# Patient Record
Sex: Male | Born: 1940 | Race: White | Hispanic: No | Marital: Married | Smoking: Never smoker
Health system: Southern US, Community
[De-identification: ages and names within clinical notes are randomized; demographics above are authoritative.]

## PROBLEM LIST (undated history)

## (undated) DIAGNOSIS — K219 Gastro-esophageal reflux disease without esophagitis: Secondary | ICD-10-CM

## (undated) DIAGNOSIS — Z8711 Personal history of peptic ulcer disease: Secondary | ICD-10-CM

## (undated) DIAGNOSIS — H269 Unspecified cataract: Secondary | ICD-10-CM

## (undated) DIAGNOSIS — M1990 Unspecified osteoarthritis, unspecified site: Secondary | ICD-10-CM

## (undated) DIAGNOSIS — Z87442 Personal history of urinary calculi: Secondary | ICD-10-CM

## (undated) DIAGNOSIS — T8859XA Other complications of anesthesia, initial encounter: Secondary | ICD-10-CM

## (undated) DIAGNOSIS — K469 Unspecified abdominal hernia without obstruction or gangrene: Secondary | ICD-10-CM

## (undated) DIAGNOSIS — Z9889 Other specified postprocedural states: Secondary | ICD-10-CM

## (undated) DIAGNOSIS — R112 Nausea with vomiting, unspecified: Secondary | ICD-10-CM

## (undated) DIAGNOSIS — H409 Unspecified glaucoma: Secondary | ICD-10-CM

## (undated) DIAGNOSIS — Z8719 Personal history of other diseases of the digestive system: Secondary | ICD-10-CM

## (undated) HISTORY — DX: Personal history of peptic ulcer disease: Z87.11

## (undated) HISTORY — DX: Gastro-esophageal reflux disease without esophagitis: K21.9

## (undated) HISTORY — DX: Unspecified abdominal hernia without obstruction or gangrene: K46.9

## (undated) HISTORY — DX: Unspecified cataract: H26.9

## (undated) HISTORY — PX: COLONOSCOPY: SHX174

## (undated) HISTORY — PX: UPPER GASTROINTESTINAL ENDOSCOPY: SHX188

## (undated) HISTORY — DX: Personal history of other diseases of the digestive system: Z87.19

## (undated) HISTORY — PX: MOLE REMOVAL: SHX2046

## (undated) HISTORY — DX: Unspecified glaucoma: H40.9

## (undated) HISTORY — PX: HERNIA REPAIR: SHX51

---

## 2003-03-18 ENCOUNTER — Ambulatory Visit (HOSPITAL_COMMUNITY): Admission: RE | Admit: 2003-03-18 | Discharge: 2003-03-18 | Payer: Self-pay | Admitting: Internal Medicine

## 2004-03-20 ENCOUNTER — Ambulatory Visit: Payer: Self-pay | Admitting: Internal Medicine

## 2005-03-22 ENCOUNTER — Ambulatory Visit: Payer: Self-pay | Admitting: Internal Medicine

## 2006-06-20 ENCOUNTER — Ambulatory Visit: Payer: Self-pay | Admitting: Internal Medicine

## 2010-05-30 NOTE — Assessment & Plan Note (Signed)
NAME:  NECHEMIA, CHIAPPETTA                CHART#:  [id number]   DATE:  06/20/2006                       DOB:  16-Sep-1940   CHIEF COMPLAINT:  Followup UC.   SUBJECTIVE:  Mr. 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  His last colonoscopy was in 2005,  where he had multiple biopsies, which were negative for dysplasia.  He  has been maintained on Asacol 1.2 g b.i.d.  He is doing quite well.  He  denies abdominal pain, rectal bleeding, melena, mucus in his stool.  He  denies any diarrhea or constipation.  He occasionally has gas or  bloating.  He had previously been on Imuran and tells me he has been  able to maintain remission on just mesalamine as he was having problems  with frequent sinus infections.   CURRENT MEDICATIONS:  See list from 06/20/06.   ALLERGIES:  No known drug allergies.   OBJECTIVE:  VITAL SIGNS:  Weight 180 lb, height 67 inches, temperature  97.6, blood pressure 120/82, pulse 72.  GENERAL:  Mr. Tashiro is a well-developed, well-nourished Caucasian male  in no acute distress.  HEENT:  Sclerae clear.  Conjunctivae pink.  Oropharynx pink and moist  without any lesions.  CHEST:  Heart regular rate and rhythm.  Normal S1, S2.  ABDOMEN:  Positive bowel sounds x4.  No bruits auscultated.  Soft,  nontender, nondistended without palpable mass, hepatosplenomegaly,  rigidity or guarding.  EXTREMITIES:  Without clubbing or edema bilaterally.  SKIN:  Pink, warm and dry without any rash or jaundice.   ASSESSMENT:  Mr. Brimage is a 70 year old Caucasian male with over 40  year duration of ulcerative colitis.  He is due for surveillance  colonoscopy with random biopsies.  He is wanting to wait until Dr.  Laural Golden begins practicing in [place] and have his procedure done there.   PLAN:  1. Check CBC and LFTs and met-7.  2. Continue Asacol 400 mg 3 p.o. b.i.d.  3. He is to schedule colonoscopy with Dr. Olevia Perches office as soon as  possible.       Vickey Huger, N.P.  Electronically Signed     Hildred Laser, M.D.  Electronically Signed    KJ/MEDQ  D:  06/21/2006  T:  06/21/2006  Job:  [id number]   cc:   Nadara Mustard, M.D.

## 2010-06-02 NOTE — Op Note (Signed)
NAME:  David Mooney, David Mooney                         ACCOUNT NO.:  [id number]   MEDICAL RECORD NO.:  [id number]                   PATIENT TYPE:  AMB   LOCATION:  DAY                                  FACILITY:  [facility]   PHYSICIAN:  Hildred Laser, M.D.                 DATE OF BIRTH:  1940/05/15   DATE OF PROCEDURE:  03/18/2003  DATE OF DISCHARGE:                                 OPERATIVE REPORT   PROCEDURE:  Total colonoscopy.   ENDOSCOPIST:  Hildred Laser, M.D.   INDICATIONS:  [name] is a 70 year old Caucasian male who has had ulcerative  colitis for over 40 years. He is undergoing surveillance colonoscopy.  Other  than some diarrhea recently he has done well. He is on oral mesalamine and  Imuran.  He is getting his periodic CBCs.  The procedure and risks were  reviewed with the patient and informed consent was obtained. He is  undergoing surveillance exam.   PREOPERATIVE MEDICATIONS:  Demerol 25 mg IV and Versed 4 mg IV in divided  dose.   FINDINGS:  Procedure performed in endoscopy suite.  The patient's vital  signs and O2 saturation were monitored during the procedure and remained  stable.  The patient was placed in the left lateral recumbent position and  rectal examination was performed.  No abnormality noted on external or  digital exam.   Olympus videoscope was placed in the rectum and advanced under vision into  the sigmoid colon and beyond.  Preparation was satisfactory.  He had focal  erythema at sigmoid colon with some scarring.  Focal scarring was also seen  involving the rest of his colon.  The scope was passed to the cecum which  was identified by ileocecal valve.  The blind end of the cecum was well  seen.  He had mucosal erythema with granularity.  He had focal colitis of  the cecum.  Pictures were taken for the record.  As the scope was withdrawn  the colonic mucosa was, once again, carefully examined.  Random biopsies  were taken from the ascending colon, not cecum  since it was inflamed, along  with biopsies from the transverse, descending and sigmoid colon and finally  from the rectum.  These biopsies were looking for dysplasia.  There were two  small polyps.  One was a the distal transverse colon which was ablated by a  cold biopsy.  The other was at sigmoid colon which was also ablated by a  cold biopsy.  Anorectal junction was well seen by retroflexing the scope and  were normal.   The endoscope was straightened and withdrawn.  The patient tolerated the  procedure well.   FINAL DIAGNOSES:  1. Mild active colitis involving the cecum, otherwise no endoscopic evidence     of a mass.  2. Two small polyps ablated by a cold biopsy; one from transverse colon and     another  one from sigmoid colon.  3. Focal colitis at cecum.  4. Random biopsies taken from different areas looking for dysplasia.   RECOMMENDATIONS:  He will continue his usual medications.  I will be  contacting the patient with biopsy results and further recommendations.      ___________________________________________                                            Hildred Laser, M.D.   NR/MEDQ  D:  03/18/2003  T:  03/18/2003  Job:  [id number]   cc:   Dr. Charm Rings  Naomi

## 2010-06-02 NOTE — Consult Note (Signed)
NAME:  David Mooney, David Mooney NO.:  [id number]   MEDICAL RECORD NO.:  [id number]                   PATIENT TYPE:   LOCATION:                                       FACILITY:   PHYSICIAN:  Hildred Laser, M.D.                 DATE OF BIRTH:  1940/01/27   DATE OF CONSULTATION:  02/25/2003  DATE OF DISCHARGE:                                   CONSULTATION   CHIEF COMPLAINT:  Ulcerative colitis.   HISTORY OF PRESENT ILLNESS:  David Mooney is a 70 year old Caucasian male who  was initially diagnosed at age 70 while he was at the [place]  with ulcerative colitis.  Last colonoscopy he believes was in [place]  approximately 4 years ago.  Last colonoscopy by David Mooney was in April 1997  which showed mild disease of the hepatic flexure, distal sigmoid, and  rectum.  He was begun on Imuran in the early part of 2003.  Symptomatically  he has been doing quite well for the last year or so until approximately 3  weeks ago he noted some abdominal distention, increasing flatulence, and  loose watery stools approximately 4-5 times a day for about 3 weeks.  However, the last 2 weeks he has noted solid bowel movements b.i.d.  He  denies any recent antibiotics, new pets, or foreign travel.  He is taking  Metamucil t.i.d. as well.  He also has history of heartburn and indigestion  and has been taking Prilosec over-the-counter for the last 4 days which he  reports good relief.  He denies any nausea or vomiting.  He denies any fever  or chills.  Weight is steady, appetite is good.  Maintenance medications  include Imuran 50 mg b.i.d., as well as Asacol 1600 mg b.i.d.   CURRENT MEDICATIONS:  1. Imuran 50 mg b.i.d.  2. Asacol 1600 mg b.i.d.  3. Multivitamin daily.  4. Naprosyn p.r.n.  5. Metamucil t.i.d.  6. Gaviscon p.r.n.  7. Vitamin E daily.  8. Prilosec 20 mg daily.   ALLERGIES:  No known drug allergies.   PAST MEDICAL HISTORY:  1. Ulcerative colitis  diagnosed at age 36.  Last colonoscopy - [place] 3-4     years ago.  Do not have this report.  2. Glaucoma.  3. Peptic ulcer disease as well as H. pylori positive status post triple-     drug treatment in March 1995.  4. Mumps as a child.   PAST SURGICAL HISTORY:  Left inguinal hernia repair.   FAMILY HISTORY:  No known family history of colorectal carcinoma, liver, or  chronic GI problems.  Mother (89) has history of dementia and father (76)  has hypertension, renal failure, and AAA.  He has two sisters in good  health.   SOCIAL HISTORY:  David Mooney has been married for 4 years.  He has two grown  children.  He is  employed full-time as an Chief Financial Officer.  He lives in [place] but he plans on moving back within the next 2 years or so.  Denies any tobacco, alcohol, or drug use.   REVIEW OF SYSTEMS:  CONSTITUTIONAL:  Weight is steady, appetite is okay.  Denies any fatigue.  MUSCULOSKELETAL:  Is complaining of occasional right  hip pain.  CARDIOVASCULAR:  Denies any chest pain or palpitations.  PULMONARY:  Denies any shortness of breath, dyspnea, or hemoptysis.  GI:  See HPI.  Denies any dysphagia or odynophagia.   PHYSICAL EXAMINATION:  VITAL SIGNS:  Weight 177 pounds, height 66.5 inches.  Temperature 96.8, blood pressure 130/90, pulse 72.  GENERAL:  David Mooney is a 70 year old Caucasian male who is alert,  oriented, pleasant, and cooperative and in no acute distress.  HEENT:  Sclerae clear, nonicteric.  Conjunctivae pink.  Oropharynx pink and  moist without any lesions.  NECK:  Supple without any mass or thyromegaly.  CHEST:  Heart regular rate and rhythm without murmurs, clicks, rubs, or  gallops.  Lungs clear to auscultation bilaterally.  ABDOMEN:  Soft with positive bowel sounds x4.  Soft, nontender,  nondistended, with no palpable mass or hepatosplenomegaly.  EXTREMITIES:  Show 2+ pedal pulses bilaterally, no edema.   LABORATORY DATA:  Last CBC from September 19, 2002:  WBC 5.2, hemoglobin  13.9, hematocrit 41.1, platelets 199.  David Mooney states he has had CBC  within the last few weeks at his primary physician.  Will request these  records.  If we do not receive them by procedure date will perform CBC at  date of procedure.   ASSESSMENT:  David Mooney is a 70 year old Caucasian male with pancolonic  ulcerative colitis.  He has given history of frequent exacerbations and  remissions.  Apparently about 6 weeks ago he may have had another  exacerbation; however, he appears to be doing better over the last 2 weeks  or so.  He has been on Imuran for a significant amount of time.  Would  recommend repeat surveillance colonoscopy at this time.   RECOMMENDATIONS:  1. Colonoscopy to be performed at [facility] by David Mooney.  I have     discussed this procedure including risks and benefits which include but     are not limited to bleeding, perforation, infection, and drug reaction     with David Mooney.  He agrees with this plan.  2. Refills are given for Asacol 400 mg four p.o. b.i.d. #240 with two     refills, Imuran 50 mg one p.o. b.i.d. #60 with two refills.  3. Further recommendations pending colonoscopy.     Les Pou, N.P.                 Hildred Laser, M.D.    KC/MEDQ  D:  02/26/2003  T:  02/26/2003  Job:  [id number]

## 2010-06-19 ENCOUNTER — Ambulatory Visit (INDEPENDENT_AMBULATORY_CARE_PROVIDER_SITE_OTHER): Payer: Medicare Other | Admitting: Internal Medicine

## 2010-06-19 DIAGNOSIS — K5289 Other specified noninfective gastroenteritis and colitis: Secondary | ICD-10-CM

## 2011-06-19 ENCOUNTER — Other Ambulatory Visit (INDEPENDENT_AMBULATORY_CARE_PROVIDER_SITE_OTHER): Payer: Self-pay | Admitting: Internal Medicine

## 2011-07-24 ENCOUNTER — Ambulatory Visit (INDEPENDENT_AMBULATORY_CARE_PROVIDER_SITE_OTHER): Payer: Medicare Other | Admitting: Internal Medicine

## 2011-07-24 ENCOUNTER — Encounter (INDEPENDENT_AMBULATORY_CARE_PROVIDER_SITE_OTHER): Payer: Self-pay | Admitting: Internal Medicine

## 2011-07-24 VITALS — BP 120/70 | HR 78 | Temp 97.7°F | Resp 20 | Ht 66.0 in | Wt 189.3 lb

## 2011-07-24 DIAGNOSIS — R143 Flatulence: Secondary | ICD-10-CM

## 2011-07-24 DIAGNOSIS — H409 Unspecified glaucoma: Secondary | ICD-10-CM | POA: Insufficient documentation

## 2011-07-24 DIAGNOSIS — R141 Gas pain: Secondary | ICD-10-CM

## 2011-07-24 DIAGNOSIS — K5190 Ulcerative colitis, unspecified, without complications: Secondary | ICD-10-CM | POA: Insufficient documentation

## 2011-07-24 MED ORDER — ALIGN PO CAPS: 1.0000 | ORAL_CAPSULE | Freq: Every day | ORAL | Status: AC

## 2011-07-24 MED ORDER — MESALAMINE 1.2 G PO TBEC: 2400.0000 mg | DELAYED_RELEASE_TABLET | Freq: Two times a day (BID) | ORAL | Status: DC

## 2011-07-24 NOTE — Progress Notes (Signed)
Presenting complaint;  Bloating and diarrhea since had a virus. History of ulcerative colitis. Subjective:  Patient is a 71 year old Caucasian male who was at her scheduled visit. He was last seen over a year ago. He has over a 50 year history of ulcerative colitis. He stated he had stomach virus back in January 2013. He recalls he had single episode of emesis and diarrhea. His bowels have not returned to normal until recently and he's also been experiencing flatulence. He does not report rectal bleeding. He did increase Asacol dose to 8 tablets per day. He denies abdominal pain fever, chills, anorexia or weight loss. Patient's last surveillance colonoscopy was in August 2009 and biopsies were negative for dysplasia.  Current Medications: Current Outpatient Prescriptions  Medication Sig Dispense Refill  . ASACOL 400 MG EC tablet TAKE 3 TABLETS BY MOUTH TWICE DAILY  180 tablet  11  . bimatoprost (LUMIGAN) 0.01 % SOLN 1 drop at bedtime. 1 drop in both eyes at bedtime      . Cholecalciferol (VITAMIN D) 2000 UNITS tablet Take 2,000 Units by mouth daily.      Marland Kitchen esomeprazole (NEXIUM) 40 MG capsule Take 40 mg by mouth daily before breakfast.      . loratadine-pseudoephedrine (CLARITIN-D 24-HOUR) 10-240 MG per 24 hr tablet Take 1 tablet by mouth as needed.      . MULTIPLE VITAMINS PO Take by mouth daily.      Marland Kitchen SALINE NASAL SPRAY NA Place into the nose as needed.      . Tamsulosin HCl (FLOMAX) 0.4 MG CAPS Take 0.4 mg by mouth daily.         Objective: Blood pressure 120/70, pulse 78, temperature 97.7 F (36.5 C), temperature source Oral, resp. rate 20, height 5' 6"  (1.676 m), weight 189 lb 4.8 oz (85.866 kg). Patient is alert and in no acute distress. Conjunctiva is pink. Sclera is nonicteric Oropharyngeal mucosa is normal. No neck masses or thyromegaly noted. Cardiac exam with regular rhythm normal S1 and S2. No murmur or gallop noted. Lungs are clear to auscultation. Abdomen is soft and  nontender without organomegaly or masses. Rectal examination reveals soft brown guaiac-negative stool.  No LE edema or clubbing noted.   Assessment:  #1. 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 His symptoms are not suggestive of relapse of ulcerative colitis. Reassuring to note that his stool is guaiac negative. His last colonoscopy was in August 2009 and was unremarkable. #2. Chronic GERD. Symptoms well-controlled with therapy.   Plan:  Start Lialda 2.4 g by mouth twice a day line one capsule by mouth daily when Asacol prescription runs out. Align one capsule by mouth daily. Office visit in one year unless symptoms persist or progress.

## 2011-07-24 NOTE — Patient Instructions (Addendum)
Discontinue Asacol when prescription runs out. New medication is he Lialda 2.4 g by mouth twice daily. Align 1 capsule by mouth daily

## 2012-07-08 ENCOUNTER — Encounter (INDEPENDENT_AMBULATORY_CARE_PROVIDER_SITE_OTHER): Payer: Self-pay | Admitting: *Deleted

## 2012-07-29 ENCOUNTER — Encounter (INDEPENDENT_AMBULATORY_CARE_PROVIDER_SITE_OTHER): Payer: Self-pay | Admitting: Internal Medicine

## 2012-07-29 ENCOUNTER — Ambulatory Visit (INDEPENDENT_AMBULATORY_CARE_PROVIDER_SITE_OTHER): Payer: Medicare Other | Admitting: Internal Medicine

## 2012-07-29 ENCOUNTER — Other Ambulatory Visit (INDEPENDENT_AMBULATORY_CARE_PROVIDER_SITE_OTHER): Payer: Self-pay | Admitting: *Deleted

## 2012-07-29 ENCOUNTER — Telehealth (INDEPENDENT_AMBULATORY_CARE_PROVIDER_SITE_OTHER): Payer: Self-pay | Admitting: *Deleted

## 2012-07-29 ENCOUNTER — Encounter (INDEPENDENT_AMBULATORY_CARE_PROVIDER_SITE_OTHER): Payer: Self-pay | Admitting: *Deleted

## 2012-07-29 VITALS — BP 158/62 | HR 76 | Temp 97.9°F | Ht 66.5 in | Wt 198.4 lb

## 2012-07-29 DIAGNOSIS — K5190 Ulcerative colitis, unspecified, without complications: Secondary | ICD-10-CM

## 2012-07-29 DIAGNOSIS — Z1211 Encounter for screening for malignant neoplasm of colon: Secondary | ICD-10-CM

## 2012-07-29 LAB — CBC WITH DIFFERENTIAL/PLATELET
Basophils Relative: 1 % (ref 0–1)
Eosinophils Absolute: 0.1 10*3/uL (ref 0.0–0.7)
Eosinophils Relative: 2 % (ref 0–5)
Hemoglobin: 12.5 g/dL — ABNORMAL LOW (ref 13.0–17.0)
Lymphs Abs: 1.6 10*3/uL (ref 0.7–4.0)
MCH: 28.4 pg (ref 26.0–34.0)
MCHC: 33.8 g/dL (ref 30.0–36.0)
MCV: 84.1 fL (ref 78.0–100.0)
Monocytes Relative: 10 % (ref 3–12)
Neutrophils Relative %: 58 % (ref 43–77)
RBC: 4.4 MIL/uL (ref 4.22–5.81)

## 2012-07-29 MED ORDER — PEG-KCL-NACL-NASULF-NA ASC-C 100 G PO SOLR: 1.0000 | Freq: Once | ORAL | Status: DC

## 2012-07-29 NOTE — Telephone Encounter (Signed)
Patient needs movi prep 

## 2012-07-29 NOTE — Progress Notes (Signed)
Subjective:     Patient ID: David Mooney, male   DOB: 1940/11/19, 72 y.o.   MRN: [id number]  HPI Here for f/u of his Ulcerative colitis.  He was last seen one year ago.  He has had UC over 50 yrs.  Maitained on Lialda 1.2gm two BID. He tells me he is doing very well.  He is having a BM 1-2 a day. Stools are normal size. No melena, mucous or blood.  Appetite is good. No weight loss.  His last colonosocpy was in 2009, and revealed focal mild colitis at cecum. Biopsy showed active chronic colitis.  Biopsies from various other segments showed mild colitis but no evidence of dysplasia.     Review of Systems   Current Outpatient Prescriptions  Medication Sig Dispense Refill  . azelastine (OPTIVAR) 0.05 % ophthalmic solution 1 drop 2 (two) times daily.      . bimatoprost (LUMIGAN) 0.01 % SOLN 1 drop at bedtime. 1 drop in both eyes at bedtime      . Cholecalciferol (VITAMIN D) 2000 UNITS tablet Take 2,000 Units by mouth daily.      Marland Kitchen esomeprazole (NEXIUM) 40 MG capsule Take 40 mg by mouth daily before breakfast.      . loratadine-pseudoephedrine (CLARITIN-D 24-HOUR) 10-240 MG per 24 hr tablet Take 1 tablet by mouth as needed.      . mesalamine (LIALDA) 1.2 G EC tablet Take 1,200 mg by mouth daily with breakfast. 2 tabs BID daily      . MULTIPLE VITAMINS PO Take by mouth daily.      . Probiotic Product (ALIGN PO) Take by mouth.      Marland Kitchen SALINE NASAL SPRAY NA Place into the nose as needed.      . Tamsulosin HCl (FLOMAX) 0.4 MG CAPS Take 0.4 mg by mouth daily.       No current facility-administered medications for this visit.   Past Medical History  Diagnosis Date  . Ulcerative colitis   . GERD (gastroesophageal reflux disease)   . Hernia of unspecified site of abdominal cavity without mention of obstruction or gangrene   . Glaucoma   . Cataract   . History of gastric ulcer    Past Surgical History  Procedure Laterality Date  . Hernia repair    . Colonoscopy    . Upper gastrointestinal  endoscopy    . Mole removal      From head , chest   No Known Allergies      Objective:   Physical Exam  Filed Vitals:   07/29/12 1136  BP: 158/62  Pulse: 76  Temp: 97.9 F (36.6 C)  Height: 5' 6.5" (1.689 m)  Weight: 198 lb 6.4 oz (89.994 kg)   Alert and oriented. Skin warm and dry. Oral mucosa is moist.   . Sclera anicteric, conjunctivae is pink. Thyroid not enlarged. No cervical lymphadenopathy. Lungs clear. Heart regular rate and rhythm.  Abdomen is soft. Bowel sounds are positive. No hepatomegaly. No abdominal masses felt. No tenderness.  No edema to lower extremities.       Assessment:   UC which appears to be in remission. He is doing well.  In need of surveillance colonoscopy    Plan:     Surveillance colonoscopy. CBC, CRP

## 2012-07-29 NOTE — Patient Instructions (Addendum)
Colonoscopy.  The risks and benefits such as perforation, bleeding, and infection were reviewed with the patient and is agreeable. 

## 2012-08-04 ENCOUNTER — Other Ambulatory Visit (INDEPENDENT_AMBULATORY_CARE_PROVIDER_SITE_OTHER): Payer: Self-pay | Admitting: Internal Medicine

## 2012-08-18 ENCOUNTER — Encounter (HOSPITAL_COMMUNITY): Payer: Self-pay | Admitting: Pharmacy Technician

## 2012-08-27 ENCOUNTER — Encounter (HOSPITAL_COMMUNITY)
Admission: RE | Disposition: A | Discharge status: Home / Self Care | Payer: Self-pay | Source: Ambulatory Visit | Attending: Internal Medicine

## 2012-08-27 ENCOUNTER — Ambulatory Visit (HOSPITAL_COMMUNITY)
Admission: RE | Admit: 2012-08-27 | Discharge: 2012-08-27 | Disposition: A | Discharge status: Home / Self Care | Payer: Medicare Other | Source: Ambulatory Visit | Attending: Internal Medicine | Admitting: Internal Medicine

## 2012-08-27 ENCOUNTER — Encounter (HOSPITAL_COMMUNITY): Payer: Self-pay | Admitting: *Deleted

## 2012-08-27 DIAGNOSIS — Z8711 Personal history of peptic ulcer disease: Secondary | ICD-10-CM | POA: Insufficient documentation

## 2012-08-27 DIAGNOSIS — K5190 Ulcerative colitis, unspecified, without complications: Secondary | ICD-10-CM

## 2012-08-27 DIAGNOSIS — Z1211 Encounter for screening for malignant neoplasm of colon: Secondary | ICD-10-CM | POA: Insufficient documentation

## 2012-08-27 DIAGNOSIS — K219 Gastro-esophageal reflux disease without esophagitis: Secondary | ICD-10-CM | POA: Insufficient documentation

## 2012-08-27 DIAGNOSIS — H269 Unspecified cataract: Secondary | ICD-10-CM | POA: Insufficient documentation

## 2012-08-27 DIAGNOSIS — K644 Residual hemorrhoidal skin tags: Secondary | ICD-10-CM

## 2012-08-27 DIAGNOSIS — H409 Unspecified glaucoma: Secondary | ICD-10-CM | POA: Insufficient documentation

## 2012-08-27 DIAGNOSIS — K6389 Other specified diseases of intestine: Secondary | ICD-10-CM

## 2012-08-27 DIAGNOSIS — Z79899 Other long term (current) drug therapy: Secondary | ICD-10-CM | POA: Insufficient documentation

## 2012-08-27 HISTORY — PX: COLONOSCOPY: SHX5424

## 2012-08-27 SURGERY — COLONOSCOPY
Anesthesia: Moderate Sedation

## 2012-08-27 MED ORDER — SODIUM CHLORIDE 0.9 % IV SOLN
INTRAVENOUS | Status: DC
  Administered 2012-08-27: 10:00:00 via INTRAVENOUS

## 2012-08-27 MED ORDER — MEPERIDINE HCL 50 MG/ML IJ SOLN
INTRAMUSCULAR | Status: AC
  Filled 2012-08-27: qty 1

## 2012-08-27 MED ORDER — MIDAZOLAM HCL 5 MG/5ML IJ SOLN
INTRAMUSCULAR | Status: DC | PRN
  Administered 2012-08-27 (×3): 2 mg via INTRAVENOUS

## 2012-08-27 MED ORDER — MIDAZOLAM HCL 5 MG/5ML IJ SOLN
INTRAMUSCULAR | Status: AC
  Filled 2012-08-27: qty 10

## 2012-08-27 MED ORDER — STERILE WATER FOR IRRIGATION IR SOLN
Status: DC | PRN
  Administered 2012-08-27: 11:00:00

## 2012-08-27 MED ORDER — MEPERIDINE HCL 50 MG/ML IJ SOLN
INTRAMUSCULAR | Status: DC | PRN
  Administered 2012-08-27 (×2): 25 mg via INTRAVENOUS

## 2012-08-27 NOTE — Op Note (Signed)
COLONOSCOPY PROCEDURE REPORT  PATIENT:  David Mooney  MR#:  [id number] Birthdate:  1940-09-29, 72 y.o., male Endoscopist:  Dr. Rogene Houston, MD Referred By:  Dr. Karren Burly, MD  Procedure Date: 08/27/2012  Procedure:   Colonoscopy  Indications:  Patient is a 72 year old Caucasian male who is undergoing surveillance colonoscopy. He has here history of ulcerative colitis and presently appears to be in remission. His last colonoscopy was in August 2009.  Informed Consent:  The procedure and risks were reviewed with the patient and informed consent was obtained.  Medications:  Demerol 50 mg IV Versed 6 mg IV  Description of procedure:  After a digital rectal exam was performed, that colonoscope was advanced from the anus through the rectum and colon to the area of the cecum, ileocecal valve and appendiceal orifice. The cecum was deeply intubated. These structures were well-seen and photographed for the record. From the level of the cecum and ileocecal valve, the scope was slowly and cautiously withdrawn. The mucosal surfaces were carefully surveyed utilizing scope tip to flexion to facilitate fold flattening as needed. The scope was pulled down into the rectum where a thorough exam including retroflexion was performed. Terminal ileum was also examined.  Findings:   Prep satisfactory. Normal mucosa of terminal ileum. Mucosa involving blunt and the cecum reveals erythema edema friability and erosions. Biopsy taken for routine histology. Mucosa of the rest of the colon was normal. Prominent scarring noted in rectosigmoid junction. Normal mucosa of rectum. Small hemorrhoids below the dentate line.   Therapeutic/Diagnostic Maneuvers Performed:  See above  Complications:  None  Cecal Withdrawal Time:  12 minutes  Impression:  Normal mucosa of terminal ileum. Focal colitis at cecum. Biopsy taken. Mucosa rest of the colon unremarkable except for scarring. Small external  hemorrhoids.  Recommendations:  Standard instructions given. I will contact patient with biopsy results and further recommendations.  Christyl Osentoski U  08/27/2012 11:19 AM  CC: Dr. Curlene Labrum, MD & Dr. Rayne Du ref. provider found

## 2012-08-27 NOTE — H&P (Signed)
David Mooney is an 72 y.o. male.   Chief Complaint: Patient is here for colonoscopy. HPI: Patient is 72 year old Caucasian male with a history of ulcerative colitis who is here for surveillance colonoscopy. His last exam was in August 2000 and. He denies rectal bleeding diarrhea or change in his bowel habits. Family history is negative for CRC.  Past Medical History  Diagnosis Date  . Ulcerative colitis   . GERD (gastroesophageal reflux disease)   . Hernia of unspecified site of abdominal cavity without mention of obstruction or gangrene   . Glaucoma   . Cataract   . History of gastric ulcer     Past Surgical History  Procedure Laterality Date  . Hernia repair    . Colonoscopy    . Upper gastrointestinal endoscopy    . Mole removal      From head , chest    Family History  Problem Relation Age of Onset  . Arrhythmia Mother   . Anuerysm Father   . Hypertension Father   . Diabetes Father   . Healthy Sister   . Healthy Sister   . Irritable bowel syndrome Daughter   . Thyroid disease Daughter    Social History:  reports that he has never smoked. He has never used smokeless tobacco. He reports that he does not drink alcohol or use illicit drugs.  Allergies: No Known Allergies  Medications Prior to Admission  Medication Sig Dispense Refill  . acetaminophen (TYLENOL) 500 MG tablet Take 1,000 mg by mouth every 6 (six) hours as needed for pain.      . bimatoprost (LUMIGAN) 0.01 % SOLN 1 drop at bedtime. 1 drop in both eyes at bedtime      . Cholecalciferol (VITAMIN D) 2000 UNITS tablet Take 2,000 Units by mouth daily.      Marland Kitchen esomeprazole (NEXIUM) 40 MG capsule Take 40 mg by mouth daily before breakfast.      . LIALDA 1.2 G EC tablet TAKE 2 TABLETS BY MOUTH TWICE DAILY  120 tablet  11  . MULTIPLE VITAMINS PO Take 1 tablet by mouth daily.       . Probiotic Product (ALIGN PO) Take 1 tablet by mouth daily.       . Tamsulosin HCl (FLOMAX) 0.4 MG CAPS Take 0.4 mg by mouth daily.       Marland Kitchen azelastine (OPTIVAR) 0.05 % ophthalmic solution Place 1 drop into both eyes daily.       Marland Kitchen loratadine-pseudoephedrine (CLARITIN-D 24-HOUR) 10-240 MG per 24 hr tablet Take 1 tablet by mouth daily as needed for allergies.         No results found for this or any previous visit (from the past 48 hour(s)). No results found.  ROS  Blood pressure 138/74, pulse 89, temperature 98.2 F (36.8 C), temperature source Oral, resp. rate 20, height 5' 6.5" (1.689 m), weight 180 lb (81.647 kg), SpO2 92.00%. Physical Exam  Constitutional: He appears well-developed and well-nourished.  HENT:  Mouth/Throat: Oropharynx is clear and moist.  Eyes: Conjunctivae are normal. No scleral icterus.  Neck: No thyromegaly present.  Cardiovascular: Normal rate, regular rhythm and normal heart sounds.   Respiratory: Effort normal and breath sounds normal.  GI: Soft. He exhibits no distension and no mass. There is no tenderness.  Musculoskeletal: He exhibits no edema.  Lymphadenopathy:    He has no cervical adenopathy.  Neurological: He is alert.  Skin: Skin is warm and dry.     Assessment/Plan Chronic ulcerative colitis.  Surveillance colonoscopy.  David Mooney 08/27/2012, 10:48 AM

## 2012-08-29 ENCOUNTER — Other Ambulatory Visit (INDEPENDENT_AMBULATORY_CARE_PROVIDER_SITE_OTHER): Payer: Self-pay | Admitting: Internal Medicine

## 2012-08-29 ENCOUNTER — Encounter (HOSPITAL_COMMUNITY): Payer: Self-pay | Admitting: Internal Medicine

## 2012-08-29 MED ORDER — MESALAMINE 400 MG PO CPDR: 1600.0000 mg | DELAYED_RELEASE_CAPSULE | Freq: Two times a day (BID) | ORAL | Status: DC

## 2012-09-01 ENCOUNTER — Encounter (INDEPENDENT_AMBULATORY_CARE_PROVIDER_SITE_OTHER): Payer: Self-pay | Admitting: *Deleted

## 2013-02-17 ENCOUNTER — Ambulatory Visit (INDEPENDENT_AMBULATORY_CARE_PROVIDER_SITE_OTHER): Payer: Medicare Other | Admitting: Urology

## 2013-02-17 DIAGNOSIS — N318 Other neuromuscular dysfunction of bladder: Secondary | ICD-10-CM

## 2013-02-17 DIAGNOSIS — R351 Nocturia: Secondary | ICD-10-CM

## 2013-02-17 DIAGNOSIS — N320 Bladder-neck obstruction: Secondary | ICD-10-CM

## 2013-03-31 ENCOUNTER — Ambulatory Visit (INDEPENDENT_AMBULATORY_CARE_PROVIDER_SITE_OTHER): Payer: Medicare Other | Admitting: Urology

## 2013-03-31 DIAGNOSIS — N318 Other neuromuscular dysfunction of bladder: Secondary | ICD-10-CM

## 2013-04-08 ENCOUNTER — Encounter (INDEPENDENT_AMBULATORY_CARE_PROVIDER_SITE_OTHER): Payer: Self-pay | Admitting: *Deleted

## 2013-08-31 ENCOUNTER — Ambulatory Visit (INDEPENDENT_AMBULATORY_CARE_PROVIDER_SITE_OTHER): Payer: Medicare Other | Admitting: Internal Medicine

## 2013-09-14 ENCOUNTER — Ambulatory Visit (INDEPENDENT_AMBULATORY_CARE_PROVIDER_SITE_OTHER): Payer: Medicare Other | Admitting: Internal Medicine

## 2013-09-14 ENCOUNTER — Encounter (INDEPENDENT_AMBULATORY_CARE_PROVIDER_SITE_OTHER): Payer: Self-pay | Admitting: Internal Medicine

## 2013-09-14 VITALS — BP 130/80 | HR 76 | Temp 97.3°F | Resp 18 | Ht 66.5 in | Wt 190.2 lb

## 2013-09-14 DIAGNOSIS — K5190 Ulcerative colitis, unspecified, without complications: Secondary | ICD-10-CM

## 2013-09-14 DIAGNOSIS — R141 Gas pain: Secondary | ICD-10-CM

## 2013-09-14 DIAGNOSIS — R143 Flatulence: Secondary | ICD-10-CM

## 2013-09-14 DIAGNOSIS — R142 Eructation: Secondary | ICD-10-CM

## 2013-09-14 DIAGNOSIS — K219 Gastro-esophageal reflux disease without esophagitis: Secondary | ICD-10-CM

## 2013-09-14 MED ORDER — MESALAMINE 1.2 G PO TBEC: 2.4000 g | DELAYED_RELEASE_TABLET | Freq: Two times a day (BID) | ORAL | Status: DC

## 2013-09-14 NOTE — Patient Instructions (Signed)
Keep symptom diary for 2 month as frequency or flatulence to determine which foods are causing this problem. Call if flatulence becomes intractable. Hemoccult x 1 when stool dark

## 2013-09-14 NOTE — Progress Notes (Signed)
Presenting complaint;  Followup for ulcerative colitis.  Subjective:  Patient is 73 year old Caucasian male who presents for scheduled visit. He was last seen July 2014. He feels well other than he has intermittent flatulence and right lower quadrant abdominal pain which is relieved when passing flatus. When this occurs if stool becomes somewhat dark but not black. He usually has one formed stool daily and occasionally 2. He denies melena rectal bleeding or discharge. He had blood work by Dr. Pleas Koch 2 months ago and was begun on statin for hyperlipidemia. He states Nexium is working. He denies heartburn dysphagia sore throat hoarseness or chronic cough. He has very good appetite but he is trying to lose weight. He has lost 8 pounds since his last visit.   Current Medications: Outpatient Encounter Prescriptions as of 09/14/2013  Medication Sig  . acetaminophen (TYLENOL) 500 MG tablet Take 325 mg by mouth 2 (two) times daily as needed.   Marland Kitchen atorvastatin (LIPITOR) 20 MG tablet Take 20 mg by mouth daily.  . bimatoprost (LUMIGAN) 0.01 % SOLN 1 drop at bedtime. 1 drop in both eyes at bedtime  . Cholecalciferol (VITAMIN D) 2000 UNITS tablet Take 2,000 Units by mouth daily.  Marland Kitchen esomeprazole (NEXIUM) 40 MG capsule Take 40 mg by mouth daily before breakfast.  . fluticasone (FLONASE) 50 MCG/ACT nasal spray Place 2 sprays into both nostrils as needed for allergies or rhinitis.  Marland Kitchen mesalamine (LIALDA) 1.2 G EC tablet Take 1.2 g by mouth. Patient takes 2 by mouth twice daily  . Phenyleph-CPM-DM-APAP (TYLENOL COLD MULTI-SYMPTOM PO) Take by mouth. Patient uses on as needed basis , when it is used it is twice a day.  . Probiotic Product (ALIGN PO) Take 1 tablet by mouth daily.   . solifenacin (VESICARE) 5 MG tablet Take 5 mg by mouth daily.  . Tamsulosin HCl (FLOMAX) 0.4 MG CAPS Take 0.4 mg by mouth daily.  . MULTIPLE VITAMINS PO Take 1 tablet by mouth daily.   . [DISCONTINUED] azelastine (OPTIVAR) 0.05 %  ophthalmic solution Place 1 drop into both eyes daily.   . [DISCONTINUED] loratadine-pseudoephedrine (CLARITIN-D 24-HOUR) 10-240 MG per 24 hr tablet Take 1 tablet by mouth daily as needed for allergies.   . [DISCONTINUED] Mesalamine (ASACOL) 400 MG CPDR DR capsule Take 4 capsules (1,600 mg total) by mouth 2 (two) times daily.     Objective: Blood pressure 130/80, pulse 76, temperature 97.3 F (36.3 C), temperature source Oral, resp. rate 18, height 5' 6.5" (1.689 m), weight 190 lb 3.2 oz (86.274 kg). Patient is alert and in no acute distress. Conjunctiva is pink. Sclera is nonicteric Oropharyngeal mucosa is normal. No neck masses or thyromegaly noted. Cardiac exam with regular rhythm normal S1 and S2. No murmur or gallop noted. Lungs are clear to auscultation. Abdomen is full. Small umbilicus hernia noted which is reducible. Abdomen is soft and nontender without organomegaly or masses.  No LE edema or clubbing noted.  Labs/studies Results: Lab data from 07/14/2013 WBC 6.2, H&H 13.3 and 39.6 and platelet count is 168K. BUN 17 and creatinine 1.36. Bilirubin 0.2, AP 61, AST 16, ALT 15, total protein 6.4 and albumin 4.1.  Assessment:  #1. 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 Patient remains in remission. Last colonoscopy was in August 2014 revealing mild focal cecal inflammation. #2. Excessive flatulence. Patient does not have a lot of symptoms. If dietary measures and symptomatic therapy does not help will consider further evaluation. #3. GERD. Symptoms well controlled with therapy.  Plan:  Hemoccult x 1 when stool is dark. Written instructions provided to patient regarding flatulogenic foods that he should avoid or eat less of. New prescription given for Lialda along with samples. Progress report in 2 months. Office visit in one year.

## 2013-09-15 DIAGNOSIS — R143 Flatulence: Secondary | ICD-10-CM | POA: Insufficient documentation

## 2013-09-15 DIAGNOSIS — K219 Gastro-esophageal reflux disease without esophagitis: Secondary | ICD-10-CM | POA: Insufficient documentation

## 2014-01-01 ENCOUNTER — Encounter (INDEPENDENT_AMBULATORY_CARE_PROVIDER_SITE_OTHER): Payer: Self-pay

## 2014-01-20 DIAGNOSIS — R7301 Impaired fasting glucose: Secondary | ICD-10-CM | POA: Diagnosis not present

## 2014-01-20 DIAGNOSIS — K219 Gastro-esophageal reflux disease without esophagitis: Secondary | ICD-10-CM | POA: Diagnosis not present

## 2014-01-20 DIAGNOSIS — K5180 Other ulcerative colitis without complications: Secondary | ICD-10-CM | POA: Diagnosis not present

## 2014-01-20 DIAGNOSIS — N183 Chronic kidney disease, stage 3 (moderate): Secondary | ICD-10-CM | POA: Diagnosis not present

## 2014-01-20 DIAGNOSIS — E782 Mixed hyperlipidemia: Secondary | ICD-10-CM | POA: Diagnosis not present

## 2014-01-26 DIAGNOSIS — N400 Enlarged prostate without lower urinary tract symptoms: Secondary | ICD-10-CM | POA: Diagnosis not present

## 2014-01-26 DIAGNOSIS — Z23 Encounter for immunization: Secondary | ICD-10-CM | POA: Diagnosis not present

## 2014-01-26 DIAGNOSIS — E782 Mixed hyperlipidemia: Secondary | ICD-10-CM | POA: Diagnosis not present

## 2014-01-26 DIAGNOSIS — N182 Chronic kidney disease, stage 2 (mild): Secondary | ICD-10-CM | POA: Diagnosis not present

## 2014-01-26 DIAGNOSIS — K219 Gastro-esophageal reflux disease without esophagitis: Secondary | ICD-10-CM | POA: Diagnosis not present

## 2014-01-26 DIAGNOSIS — K5180 Other ulcerative colitis without complications: Secondary | ICD-10-CM | POA: Diagnosis not present

## 2014-06-01 ENCOUNTER — Encounter (INDEPENDENT_AMBULATORY_CARE_PROVIDER_SITE_OTHER): Payer: Self-pay | Admitting: *Deleted

## 2014-07-30 DIAGNOSIS — L247 Irritant contact dermatitis due to plants, except food: Secondary | ICD-10-CM | POA: Diagnosis not present

## 2014-08-30 DIAGNOSIS — E782 Mixed hyperlipidemia: Secondary | ICD-10-CM | POA: Diagnosis not present

## 2014-08-30 DIAGNOSIS — R7301 Impaired fasting glucose: Secondary | ICD-10-CM | POA: Diagnosis not present

## 2014-08-30 DIAGNOSIS — E559 Vitamin D deficiency, unspecified: Secondary | ICD-10-CM | POA: Diagnosis not present

## 2014-08-30 DIAGNOSIS — K219 Gastro-esophageal reflux disease without esophagitis: Secondary | ICD-10-CM | POA: Diagnosis not present

## 2014-08-30 DIAGNOSIS — N182 Chronic kidney disease, stage 2 (mild): Secondary | ICD-10-CM | POA: Diagnosis not present

## 2014-09-06 DIAGNOSIS — K5180 Other ulcerative colitis without complications: Secondary | ICD-10-CM | POA: Diagnosis not present

## 2014-09-06 DIAGNOSIS — Z0001 Encounter for general adult medical examination with abnormal findings: Secondary | ICD-10-CM | POA: Diagnosis not present

## 2014-09-06 DIAGNOSIS — Z1389 Encounter for screening for other disorder: Secondary | ICD-10-CM | POA: Diagnosis not present

## 2014-09-16 ENCOUNTER — Ambulatory Visit (INDEPENDENT_AMBULATORY_CARE_PROVIDER_SITE_OTHER): Payer: Medicare Other | Admitting: Internal Medicine

## 2014-09-16 ENCOUNTER — Encounter (INDEPENDENT_AMBULATORY_CARE_PROVIDER_SITE_OTHER): Payer: Self-pay | Admitting: Internal Medicine

## 2014-09-16 VITALS — BP 112/65 | HR 60 | Temp 98.3°F | Ht 66.5 in | Wt 186.8 lb

## 2014-09-16 DIAGNOSIS — K219 Gastro-esophageal reflux disease without esophagitis: Secondary | ICD-10-CM | POA: Diagnosis not present

## 2014-09-16 DIAGNOSIS — K5120 Ulcerative (chronic) proctitis without complications: Secondary | ICD-10-CM

## 2014-09-16 NOTE — Patient Instructions (Signed)
OV in 1 year.  

## 2014-09-16 NOTE — Progress Notes (Signed)
Subjective:    Patient ID: David Mooney, male    DOB: 03/18/1940, 74 y.o.   MRN: [id number]  HPI Here today for f/u of his UC. Disease duration of 49 yrs. Last colonoscopy was in August of 2014 revealing mild focal cecal inflammation. He was last seen by Dr Laural Golden in August of 2015 Maintained Lialda 2 tabs twice a day. He tells me he is doing good. No abdominal pain. He does have flatus at times. GERD controlled with Nexium. He has not had any rectal bleeding or abdominal pain. His appetite is good. He has lost 3 pounds since his last visit in August of 2015.   He exercises by working in yard and pulling weeds. He does do some walking.     Review of Systems Past Medical History  Diagnosis Date  . Ulcerative colitis   . GERD (gastroesophageal reflux disease)   . Hernia of unspecified site of abdominal cavity without mention of obstruction or gangrene   . Glaucoma   . Cataract   . History of gastric ulcer     Past Surgical History  Procedure Laterality Date  . Hernia repair    . Colonoscopy    . Upper gastrointestinal endoscopy    . Mole removal      From head , chest  . Colonoscopy N/A 08/27/2012    Procedure: COLONOSCOPY;  Surgeon: Rogene Houston, MD;  Location: [facility];  Service: Endoscopy;  Laterality: N/A;  1200-moved to 13:15 Ann to notify pt    No Known Allergies  Current Outpatient Prescriptions on File Prior to Visit  Medication Sig Dispense Refill  . acetaminophen (TYLENOL) 500 MG tablet Take 325 mg by mouth 2 (two) times daily as needed.     Marland Kitchen atorvastatin (LIPITOR) 20 MG tablet Take 20 mg by mouth daily.    . bimatoprost (LUMIGAN) 0.01 % SOLN 1 drop at bedtime. 1 drop in both eyes at bedtime    . Cholecalciferol (VITAMIN D) 2000 UNITS tablet Take 2,000 Units by mouth daily.    Marland Kitchen esomeprazole (NEXIUM) 40 MG capsule Take 40 mg by mouth daily before breakfast.    . fluticasone (FLONASE) 50 MCG/ACT nasal spray Place 2 sprays into both nostrils as needed  for allergies or rhinitis.    Marland Kitchen mesalamine (LIALDA) 1.2 G EC tablet Take 2 tablets (2.4 g total) by mouth 2 (two) times daily. Patient takes 2 by mouth twice daily 120 tablet 11  . MULTIPLE VITAMINS PO Take 1 tablet by mouth daily.     Marland Kitchen Phenyleph-CPM-DM-APAP (TYLENOL COLD MULTI-SYMPTOM PO) Take by mouth. Patient uses on as needed basis , when it is used it is twice a day.    . Probiotic Product (ALIGN PO) Take 1 tablet by mouth daily.     . solifenacin (VESICARE) 5 MG tablet Take 5 mg by mouth daily.    . Tamsulosin HCl (FLOMAX) 0.4 MG CAPS Take 0.4 mg by mouth daily.     No current facility-administered medications on file prior to visit.        Objective:   Physical Exam Blood pressure 112/65, pulse 60, temperature 98.3 F (36.8 C), height 5' 6.5" (1.689 m), weight 186 lb 12.8 oz (84.732 kg).  Alert and oriented. Skin warm and dry. Oral mucosa is moist.   . Sclera anicteric, conjunctivae is pink. Thyroid not enlarged. No cervical lymphadenopathy. Lungs clear. Heart regular rate and rhythm.  Abdomen is soft. Bowel sounds are positive. No hepatomegaly. No  abdominal masses felt. No tenderness.  No edema to lower extremities.         Assessment & Plan:    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 Patient remains in remission. Last colonoscopy was in August 2014 revealing mild focal cecal inflammation. GERD controlled with Nexium. OV in 1 year.

## 2014-10-05 ENCOUNTER — Ambulatory Visit (INDEPENDENT_AMBULATORY_CARE_PROVIDER_SITE_OTHER): Payer: Medicare Other | Admitting: Urology

## 2014-10-05 DIAGNOSIS — N3281 Overactive bladder: Secondary | ICD-10-CM | POA: Diagnosis not present

## 2014-11-09 DIAGNOSIS — S70361A Insect bite (nonvenomous), right thigh, initial encounter: Secondary | ICD-10-CM | POA: Diagnosis not present

## 2014-11-29 ENCOUNTER — Other Ambulatory Visit (INDEPENDENT_AMBULATORY_CARE_PROVIDER_SITE_OTHER): Payer: Self-pay | Admitting: Internal Medicine

## 2015-01-03 DIAGNOSIS — H40013 Open angle with borderline findings, low risk, bilateral: Secondary | ICD-10-CM | POA: Diagnosis not present

## 2015-03-02 DIAGNOSIS — K219 Gastro-esophageal reflux disease without esophagitis: Secondary | ICD-10-CM | POA: Diagnosis not present

## 2015-03-02 DIAGNOSIS — N182 Chronic kidney disease, stage 2 (mild): Secondary | ICD-10-CM | POA: Diagnosis not present

## 2015-03-02 DIAGNOSIS — E782 Mixed hyperlipidemia: Secondary | ICD-10-CM | POA: Diagnosis not present

## 2015-03-02 DIAGNOSIS — R7301 Impaired fasting glucose: Secondary | ICD-10-CM | POA: Diagnosis not present

## 2015-03-09 DIAGNOSIS — E782 Mixed hyperlipidemia: Secondary | ICD-10-CM | POA: Diagnosis not present

## 2015-03-09 DIAGNOSIS — N3281 Overactive bladder: Secondary | ICD-10-CM | POA: Diagnosis not present

## 2015-03-09 DIAGNOSIS — K219 Gastro-esophageal reflux disease without esophagitis: Secondary | ICD-10-CM | POA: Diagnosis not present

## 2015-03-09 DIAGNOSIS — B079 Viral wart, unspecified: Secondary | ICD-10-CM | POA: Diagnosis not present

## 2015-03-09 DIAGNOSIS — K5180 Other ulcerative colitis without complications: Secondary | ICD-10-CM | POA: Diagnosis not present

## 2015-03-09 DIAGNOSIS — N182 Chronic kidney disease, stage 2 (mild): Secondary | ICD-10-CM | POA: Diagnosis not present

## 2015-06-01 ENCOUNTER — Encounter (INDEPENDENT_AMBULATORY_CARE_PROVIDER_SITE_OTHER): Payer: Self-pay | Admitting: Internal Medicine

## 2015-06-15 DIAGNOSIS — L247 Irritant contact dermatitis due to plants, except food: Secondary | ICD-10-CM | POA: Diagnosis not present

## 2015-06-21 DIAGNOSIS — H43813 Vitreous degeneration, bilateral: Secondary | ICD-10-CM | POA: Diagnosis not present

## 2015-08-22 ENCOUNTER — Ambulatory Visit (INDEPENDENT_AMBULATORY_CARE_PROVIDER_SITE_OTHER): Payer: Self-pay | Admitting: Internal Medicine

## 2015-08-23 ENCOUNTER — Encounter (INDEPENDENT_AMBULATORY_CARE_PROVIDER_SITE_OTHER): Payer: Self-pay | Admitting: Internal Medicine

## 2015-08-23 ENCOUNTER — Ambulatory Visit (INDEPENDENT_AMBULATORY_CARE_PROVIDER_SITE_OTHER): Payer: Self-pay | Admitting: Internal Medicine

## 2015-08-30 DIAGNOSIS — K219 Gastro-esophageal reflux disease without esophagitis: Secondary | ICD-10-CM | POA: Diagnosis not present

## 2015-08-30 DIAGNOSIS — E782 Mixed hyperlipidemia: Secondary | ICD-10-CM | POA: Diagnosis not present

## 2015-08-30 DIAGNOSIS — R7301 Impaired fasting glucose: Secondary | ICD-10-CM | POA: Diagnosis not present

## 2015-08-30 DIAGNOSIS — N183 Chronic kidney disease, stage 3 (moderate): Secondary | ICD-10-CM | POA: Diagnosis not present

## 2015-09-08 ENCOUNTER — Encounter (INDEPENDENT_AMBULATORY_CARE_PROVIDER_SITE_OTHER): Payer: Self-pay | Admitting: Internal Medicine

## 2015-09-08 ENCOUNTER — Ambulatory Visit (INDEPENDENT_AMBULATORY_CARE_PROVIDER_SITE_OTHER): Payer: Medicare Other | Admitting: Internal Medicine

## 2015-09-08 VITALS — BP 150/70 | HR 76 | Temp 98.0°F | Ht 66.5 in | Wt 188.2 lb

## 2015-09-08 DIAGNOSIS — K5120 Ulcerative (chronic) proctitis without complications: Secondary | ICD-10-CM | POA: Diagnosis not present

## 2015-09-08 NOTE — Patient Instructions (Signed)
Labs today and OV in 1 year.

## 2015-09-08 NOTE — Progress Notes (Signed)
Subjective:    Patient ID: David Mooney, male    DOB: 05/28/1940, 75 y.o.   MRN: [id number]  HPI  HPI Here today for f/u of his UC. Disease duration of 50 yrs. Last colonoscopy was in August of 2014 revealing mild focal cecal inflammation. He was last seen by me in September of 2016. Maintained Lialda 2 tabs twice a day. He tells me he is doing good. No abdominal pain. He does have flatus at times. He has not had any rectal bleeding or abdominal pain. His appetite is good. He has gained 2 pounds. He is having a BM daily or every other day. When he has a BM he may have 1-2 stools.  No melena or BRRB.   He exercises by working in yard and pulling weeds. He does do some walking. He has just got back from [facility].      Review of Systems Past Medical History:  Diagnosis Date  . Cataract   . GERD (gastroesophageal reflux disease)   . Glaucoma   . Hernia of unspecified site of abdominal cavity without mention of obstruction or gangrene   . History of gastric ulcer   . Ulcerative colitis     Past Surgical History:  Procedure Laterality Date  . COLONOSCOPY    . COLONOSCOPY N/A 08/27/2012   Procedure: COLONOSCOPY;  Surgeon: Rogene Houston, MD;  Location: [facility];  Service: Endoscopy;  Laterality: N/A;  1200-moved to 13:15 Ann to notify pt  . HERNIA REPAIR    . MOLE REMOVAL     From head , chest  . UPPER GASTROINTESTINAL ENDOSCOPY      No Known Allergies  Current Outpatient Prescriptions on File Prior to Visit  Medication Sig Dispense Refill  . acetaminophen (TYLENOL) 500 MG tablet Take 325 mg by mouth 2 (two) times daily as needed.     Marland Kitchen atorvastatin (LIPITOR) 20 MG tablet Take 20 mg by mouth daily.    . bimatoprost (LUMIGAN) 0.01 % SOLN 1 drop at bedtime. 1 drop in both eyes at bedtime    . Cholecalciferol (VITAMIN D) 2000 UNITS tablet Take 2,000 Units by mouth daily.    Marland Kitchen esomeprazole (NEXIUM) 40 MG capsule Take 40 mg by mouth daily before breakfast.    .  fluticasone (FLONASE) 50 MCG/ACT nasal spray Place 2 sprays into both nostrils as needed for allergies or rhinitis.    Marland Kitchen LIALDA 1.2 G EC tablet TAKE 2 TABLETS BY MOUTH TWICE DAILY 120 tablet 11  . MULTIPLE VITAMINS PO Take 1 tablet by mouth daily.     Marland Kitchen Phenyleph-CPM-DM-APAP (TYLENOL COLD MULTI-SYMPTOM PO) Take by mouth. Patient uses on as needed basis , when it is used it is twice a day.    . Probiotic Product (ALIGN PO) Take 1 tablet by mouth daily.     . solifenacin (VESICARE) 5 MG tablet Take 5 mg by mouth daily.    . Tamsulosin HCl (FLOMAX) 0.4 MG CAPS Take 0.4 mg by mouth daily.     No current facility-administered medications on file prior to visit.        Objective:   Physical Exam Blood pressure (!) 150/70, pulse 76, temperature 98 F (36.7 C), height 5' 6.5" (1.689 m), weight 188 lb 3.2 oz (85.4 kg). Alert and oriented. Skin warm and dry. Oral mucosa is moist.   . Sclera anicteric, conjunctivae is pink. Thyroid not enlarged. No cervical lymphadenopathy. Lungs clear. Heart regular rate and rhythm.  Abdomen is  soft. Bowel sounds are positive. No hepatomegaly. No abdominal masses felt. No tenderness.  No edema to lower extremities.          Assessment & Plan:  UC he is doing well. No GI problems.  CBC and sedrate today. OV in 1 year.

## 2015-09-09 LAB — CBC WITH DIFFERENTIAL/PLATELET
BASOS PCT: 1 %
Basophils Absolute: 66 cells/uL (ref 0–200)
Eosinophils Absolute: 198 cells/uL (ref 15–500)
Eosinophils Relative: 3 %
HCT: 38.7 % (ref 38.5–50.0)
Hemoglobin: 13 g/dL — ABNORMAL LOW (ref 13.2–17.1)
LYMPHS ABS: 1848 {cells}/uL (ref 850–3900)
LYMPHS PCT: 28 %
MCH: 30.6 pg (ref 27.0–33.0)
MCHC: 33.6 g/dL (ref 32.0–36.0)
MCV: 91.1 fL (ref 80.0–100.0)
MONO ABS: 660 {cells}/uL (ref 200–950)
MPV: 10.1 fL (ref 7.5–12.5)
Monocytes Relative: 10 %
NEUTROS ABS: 3828 {cells}/uL (ref 1500–7800)
Neutrophils Relative %: 58 %
PLATELETS: 170 10*3/uL (ref 140–400)
RBC: 4.25 MIL/uL (ref 4.20–5.80)
RDW: 14.1 % (ref 11.0–15.0)
WBC: 6.6 10*3/uL (ref 3.8–10.8)

## 2015-09-09 LAB — SEDIMENTATION RATE: SED RATE: 10 mm/h (ref 0–20)

## 2015-09-13 ENCOUNTER — Encounter (INDEPENDENT_AMBULATORY_CARE_PROVIDER_SITE_OTHER): Payer: Self-pay

## 2015-09-15 ENCOUNTER — Telehealth (INDEPENDENT_AMBULATORY_CARE_PROVIDER_SITE_OTHER): Payer: Self-pay | Admitting: Internal Medicine

## 2015-09-20 NOTE — Telephone Encounter (Signed)
errir

## 2015-10-25 DIAGNOSIS — N182 Chronic kidney disease, stage 2 (mild): Secondary | ICD-10-CM | POA: Diagnosis not present

## 2015-10-25 DIAGNOSIS — B079 Viral wart, unspecified: Secondary | ICD-10-CM | POA: Diagnosis not present

## 2015-10-25 DIAGNOSIS — K5180 Other ulcerative colitis without complications: Secondary | ICD-10-CM | POA: Diagnosis not present

## 2015-10-25 DIAGNOSIS — R7301 Impaired fasting glucose: Secondary | ICD-10-CM | POA: Diagnosis not present

## 2015-10-25 DIAGNOSIS — E782 Mixed hyperlipidemia: Secondary | ICD-10-CM | POA: Diagnosis not present

## 2015-10-25 DIAGNOSIS — Z0001 Encounter for general adult medical examination with abnormal findings: Secondary | ICD-10-CM | POA: Diagnosis not present

## 2015-10-31 DIAGNOSIS — Z87898 Personal history of other specified conditions: Secondary | ICD-10-CM | POA: Diagnosis not present

## 2015-11-03 DIAGNOSIS — R142 Eructation: Secondary | ICD-10-CM | POA: Diagnosis not present

## 2015-11-03 DIAGNOSIS — K219 Gastro-esophageal reflux disease without esophagitis: Secondary | ICD-10-CM | POA: Diagnosis not present

## 2015-11-03 DIAGNOSIS — B079 Viral wart, unspecified: Secondary | ICD-10-CM | POA: Diagnosis not present

## 2015-11-03 DIAGNOSIS — K5180 Other ulcerative colitis without complications: Secondary | ICD-10-CM | POA: Diagnosis not present

## 2015-11-10 DIAGNOSIS — B079 Viral wart, unspecified: Secondary | ICD-10-CM | POA: Diagnosis not present

## 2015-11-16 DIAGNOSIS — B079 Viral wart, unspecified: Secondary | ICD-10-CM | POA: Diagnosis not present

## 2015-12-02 ENCOUNTER — Other Ambulatory Visit (INDEPENDENT_AMBULATORY_CARE_PROVIDER_SITE_OTHER): Payer: Self-pay | Admitting: Internal Medicine

## 2016-04-25 DIAGNOSIS — N183 Chronic kidney disease, stage 3 (moderate): Secondary | ICD-10-CM | POA: Diagnosis not present

## 2016-04-25 DIAGNOSIS — K219 Gastro-esophageal reflux disease without esophagitis: Secondary | ICD-10-CM | POA: Diagnosis not present

## 2016-04-25 DIAGNOSIS — R7301 Impaired fasting glucose: Secondary | ICD-10-CM | POA: Diagnosis not present

## 2016-04-25 DIAGNOSIS — E782 Mixed hyperlipidemia: Secondary | ICD-10-CM | POA: Diagnosis not present

## 2016-04-30 DIAGNOSIS — K5180 Other ulcerative colitis without complications: Secondary | ICD-10-CM | POA: Diagnosis not present

## 2016-04-30 DIAGNOSIS — K219 Gastro-esophageal reflux disease without esophagitis: Secondary | ICD-10-CM | POA: Diagnosis not present

## 2016-04-30 DIAGNOSIS — E782 Mixed hyperlipidemia: Secondary | ICD-10-CM | POA: Diagnosis not present

## 2016-04-30 DIAGNOSIS — N182 Chronic kidney disease, stage 2 (mild): Secondary | ICD-10-CM | POA: Diagnosis not present

## 2016-04-30 DIAGNOSIS — N3281 Overactive bladder: Secondary | ICD-10-CM | POA: Diagnosis not present

## 2016-05-21 DIAGNOSIS — L401 Generalized pustular psoriasis: Secondary | ICD-10-CM | POA: Diagnosis not present

## 2016-07-20 ENCOUNTER — Encounter (INDEPENDENT_AMBULATORY_CARE_PROVIDER_SITE_OTHER): Payer: Self-pay

## 2016-07-20 ENCOUNTER — Encounter (INDEPENDENT_AMBULATORY_CARE_PROVIDER_SITE_OTHER): Payer: Self-pay | Admitting: Internal Medicine

## 2016-09-07 ENCOUNTER — Ambulatory Visit (INDEPENDENT_AMBULATORY_CARE_PROVIDER_SITE_OTHER): Payer: Self-pay | Admitting: Internal Medicine

## 2016-09-11 ENCOUNTER — Ambulatory Visit (INDEPENDENT_AMBULATORY_CARE_PROVIDER_SITE_OTHER): Payer: Self-pay | Admitting: Internal Medicine

## 2016-09-12 ENCOUNTER — Ambulatory Visit (INDEPENDENT_AMBULATORY_CARE_PROVIDER_SITE_OTHER): Payer: Medicare Other | Admitting: Internal Medicine

## 2016-09-12 ENCOUNTER — Encounter (INDEPENDENT_AMBULATORY_CARE_PROVIDER_SITE_OTHER): Payer: Self-pay | Admitting: Internal Medicine

## 2016-09-12 VITALS — BP 140/80 | HR 72 | Temp 98.2°F | Ht 66.6 in | Wt 191.6 lb

## 2016-09-12 DIAGNOSIS — K5120 Ulcerative (chronic) proctitis without complications: Secondary | ICD-10-CM

## 2016-09-12 LAB — CBC WITH DIFFERENTIAL/PLATELET
Basophils Absolute: 64 cells/uL (ref 0–200)
Basophils Relative: 1 %
EOS ABS: 192 {cells}/uL (ref 15–500)
Eosinophils Relative: 3 %
HCT: 40 % (ref 38.5–50.0)
HEMOGLOBIN: 13.5 g/dL (ref 13.2–17.1)
LYMPHS ABS: 1856 {cells}/uL (ref 850–3900)
Lymphocytes Relative: 29 %
MCH: 30.4 pg (ref 27.0–33.0)
MCHC: 33.8 g/dL (ref 32.0–36.0)
MCV: 90.1 fL (ref 80.0–100.0)
MONOS PCT: 12 %
MPV: 10.5 fL (ref 7.5–12.5)
Monocytes Absolute: 768 cells/uL (ref 200–950)
NEUTROS ABS: 3520 {cells}/uL (ref 1500–7800)
Neutrophils Relative %: 55 %
Platelets: 169 10*3/uL (ref 140–400)
RBC: 4.44 MIL/uL (ref 4.20–5.80)
RDW: 13.6 % (ref 11.0–15.0)
WBC: 6.4 10*3/uL (ref 3.8–10.8)

## 2016-09-12 LAB — SEDIMENTATION RATE: Sed Rate: 6 mm/hr (ref 0–20)

## 2016-09-12 NOTE — Patient Instructions (Signed)
Labs today. OV in 1 year.

## 2016-09-12 NOTE — Progress Notes (Signed)
Subjective:    Patient ID: David Mooney, male    DOB: 1940-05-16, 76 y.o.   MRN: [id number]  HPI Here today for f/u. Last seen 08/2015. Hx of UC.  Maintained on Lialda 2 tabs twice a day. Disease duration over 50 yrs. Lat colonoscopy in 2014 revealed mild focal cecal inflammation.  He says he has flatus at time after he eats. Takes Simethicone as needed. Weight 188. Today his weight is 191.6. He says his BM are normal. Usually has a BM daily. No melena or BRRB.  His appetite is good.  No abdominal pain.  He walks about a mile daily.    08/27/2013 Colonoscopy. Surveillance for UC.  Impression:  Normal mucosa of terminal ileum. Focal colitis at cecum. Biopsy taken. Mucosa rest of the colon unremarkable except for scarring. Small external hemorrhoids.  Review of Systems Past Medical History:  Diagnosis Date  . Cataract   . GERD (gastroesophageal reflux disease)   . Glaucoma   . Hernia of unspecified site of abdominal cavity without mention of obstruction or gangrene   . History of gastric ulcer   . Ulcerative colitis     Past Surgical History:  Procedure Laterality Date  . COLONOSCOPY    . COLONOSCOPY N/A 08/27/2012   Procedure: COLONOSCOPY;  Surgeon: Rogene Houston, MD;  Location: [facility];  Service: Endoscopy;  Laterality: N/A;  1200-moved to 13:15 Ann to notify pt  . HERNIA REPAIR    . MOLE REMOVAL     From head , chest  . UPPER GASTROINTESTINAL ENDOSCOPY      No Known Allergies  Current Outpatient Prescriptions on File Prior to Visit  Medication Sig Dispense Refill  . acetaminophen (TYLENOL) 500 MG tablet Take 325 mg by mouth 2 (two) times daily as needed.     Marland Kitchen atorvastatin (LIPITOR) 20 MG tablet Take 10 mg by mouth daily.     . bimatoprost (LUMIGAN) 0.01 % SOLN 1 drop at bedtime. 1 drop in both eyes at bedtime    . Cholecalciferol (VITAMIN D) 2000 UNITS tablet Take 2,000 Units by mouth as needed.     Marland Kitchen esomeprazole (NEXIUM) 40 MG capsule Take 40 mg by  mouth daily before breakfast.    . fluticasone (FLONASE) 50 MCG/ACT nasal spray Place 2 sprays into both nostrils daily.     . mesalamine (LIALDA) 1.2 g EC tablet TAKE 2 TABLETS BY MOUTH TWICE DAILY 120 tablet 11  . MULTIPLE VITAMINS PO Take 1 tablet by mouth daily.     Marland Kitchen Phenyleph-CPM-DM-APAP (TYLENOL COLD MULTI-SYMPTOM PO) Take by mouth. Patient uses on as needed basis , when it is used it is twice a day.    . Probiotic Product (ALIGN PO) Take 1 tablet by mouth daily.     . solifenacin (VESICARE) 5 MG tablet Take 5 mg by mouth daily.    . Tamsulosin HCl (FLOMAX) 0.4 MG CAPS Take 0.4 mg by mouth daily.     No current facility-administered medications on file prior to visit.         Objective:   Physical Exam Blood pressure 140/80, pulse 72, temperature 98.2 F (36.8 C), height 5' 6.6" (1.692 m), weight 191 lb 9.6 oz (86.9 kg). Alert and oriented. Skin warm and dry. Oral mucosa is moist.   . Sclera anicteric, conjunctivae is pink. Thyroid not enlarged. No cervical lymphadenopathy. Lungs clear. Heart regular rate and rhythm.  Abdomen is soft. Bowel sounds are positive. No hepatomegaly. No abdominal masses felt.  No tenderness.  No edema to lower extremities.           Assessment & Plan:  UC. Doing well. Will get a CBC and sedrate. OV in 1 year.

## 2016-10-30 ENCOUNTER — Ambulatory Visit (INDEPENDENT_AMBULATORY_CARE_PROVIDER_SITE_OTHER): Payer: Medicare Other | Admitting: Urology

## 2016-10-30 DIAGNOSIS — N401 Enlarged prostate with lower urinary tract symptoms: Secondary | ICD-10-CM | POA: Diagnosis not present

## 2016-10-30 DIAGNOSIS — R3915 Urgency of urination: Secondary | ICD-10-CM

## 2016-11-08 DIAGNOSIS — E782 Mixed hyperlipidemia: Secondary | ICD-10-CM | POA: Diagnosis not present

## 2016-11-08 DIAGNOSIS — K219 Gastro-esophageal reflux disease without esophagitis: Secondary | ICD-10-CM | POA: Diagnosis not present

## 2016-11-08 DIAGNOSIS — N183 Chronic kidney disease, stage 3 (moderate): Secondary | ICD-10-CM | POA: Diagnosis not present

## 2016-11-08 DIAGNOSIS — E559 Vitamin D deficiency, unspecified: Secondary | ICD-10-CM | POA: Diagnosis not present

## 2016-11-08 DIAGNOSIS — R7301 Impaired fasting glucose: Secondary | ICD-10-CM | POA: Diagnosis not present

## 2016-11-13 DIAGNOSIS — Z0001 Encounter for general adult medical examination with abnormal findings: Secondary | ICD-10-CM | POA: Diagnosis not present

## 2016-11-13 DIAGNOSIS — K5180 Other ulcerative colitis without complications: Secondary | ICD-10-CM | POA: Diagnosis not present

## 2016-11-13 DIAGNOSIS — K219 Gastro-esophageal reflux disease without esophagitis: Secondary | ICD-10-CM | POA: Diagnosis not present

## 2016-11-13 DIAGNOSIS — E782 Mixed hyperlipidemia: Secondary | ICD-10-CM | POA: Diagnosis not present

## 2016-11-13 DIAGNOSIS — E559 Vitamin D deficiency, unspecified: Secondary | ICD-10-CM | POA: Diagnosis not present

## 2016-12-16 ENCOUNTER — Other Ambulatory Visit (INDEPENDENT_AMBULATORY_CARE_PROVIDER_SITE_OTHER): Payer: Self-pay | Admitting: Internal Medicine

## 2017-05-16 DIAGNOSIS — R7301 Impaired fasting glucose: Secondary | ICD-10-CM | POA: Diagnosis not present

## 2017-05-16 DIAGNOSIS — E782 Mixed hyperlipidemia: Secondary | ICD-10-CM | POA: Diagnosis not present

## 2017-05-17 DIAGNOSIS — N182 Chronic kidney disease, stage 2 (mild): Secondary | ICD-10-CM | POA: Diagnosis not present

## 2017-05-17 DIAGNOSIS — R7301 Impaired fasting glucose: Secondary | ICD-10-CM | POA: Diagnosis not present

## 2017-05-17 DIAGNOSIS — N400 Enlarged prostate without lower urinary tract symptoms: Secondary | ICD-10-CM | POA: Diagnosis not present

## 2017-05-17 DIAGNOSIS — Z1331 Encounter for screening for depression: Secondary | ICD-10-CM | POA: Diagnosis not present

## 2017-05-17 DIAGNOSIS — Z1389 Encounter for screening for other disorder: Secondary | ICD-10-CM | POA: Diagnosis not present

## 2017-05-17 DIAGNOSIS — K5180 Other ulcerative colitis without complications: Secondary | ICD-10-CM | POA: Diagnosis not present

## 2017-05-17 DIAGNOSIS — E782 Mixed hyperlipidemia: Secondary | ICD-10-CM | POA: Diagnosis not present

## 2017-09-03 DIAGNOSIS — H35031 Hypertensive retinopathy, right eye: Secondary | ICD-10-CM | POA: Diagnosis not present

## 2017-09-05 ENCOUNTER — Ambulatory Visit: Payer: Medicare Other | Attending: Audiology | Admitting: Audiology

## 2017-09-05 DIAGNOSIS — H93299 Other abnormal auditory perceptions, unspecified ear: Secondary | ICD-10-CM | POA: Insufficient documentation

## 2017-09-05 DIAGNOSIS — R94128 Abnormal results of other function studies of ear and other special senses: Secondary | ICD-10-CM | POA: Insufficient documentation

## 2017-09-05 DIAGNOSIS — Z01118 Encounter for examination of ears and hearing with other abnormal findings: Secondary | ICD-10-CM | POA: Diagnosis not present

## 2017-09-05 DIAGNOSIS — H9193 Unspecified hearing loss, bilateral: Secondary | ICD-10-CM | POA: Diagnosis not present

## 2017-09-05 NOTE — Procedures (Signed)
[facility]  [place], [place] [address]  [phone]   Audiological Evaluation  Patient Name: David Mooney   Status: Outpatient   DOB: Dec 08, 1940    Diagnosis: Hearing Loss, Tinnitus MRN: [id number] Date:  09/05/2017     Referent: Curlene Labrum, MD  History: David Mooney was seen for an audiological evaluation. Accompanied by: His wife Primary Concern: "Hearing loss" " Have difficulty when people don't speak clearly or there is background". Pain: None History of hearing problems: Y - "gradual hearing loss". History of ear infections:  N History of dizziness/vertigo:   Y - "during balance event one year ago". History of balance issues:  Y - with "nausea" about "one year ago and lasted a few hours". Tinnitus: Y - intermittent "low pitched" soft tinnitus. 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   History of hypertension: N History of diabetes:  N Family history of hearing loss:  Sister has hearing loss from childhood. "Can't hear even with hearing aid" Other concerns: "autoimmune issue-ulcerative colitis".    Evaluation: Conventional pure tone audiometry from 250Hz  - 8000Hz  with using insert earphones.  Hearing Thresholds are fairly symmetrical bilaterally ranging from 10-20 dB HL from 250 to 500 Hz, 25 DB HL at 1000 Hz, 40-45 DB HL at 2000 Hz, and 65-70 DB HL from 3000 8000 Hz bilaterally.  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  Left ear: 20 dBHL Word recognition (at comfortably loud volumes) using recorded NU-6 word lists at 60 dBHL, in quiet.  Right ear: 84 %.  Left ear:   88 % Word recognition in minimal background noise:  +5 dBHL   Right ear: 28 %                              Left ear: 46%  Tympanometry (middle ear function) with ipsilateral acoustic reflexes.  Right ear: Normal (Type A) Ipsilateral acoustic reflexes that range from 105 at 500 Hz to 1000 Hz, 95 dB at 2000 Hz and no response at 4000 Hz.   Left ear: Normal (Type A) ipsilateral acoustic reflexes of 85 dB from 500 to 1000 Hz, 95 DB at 2000 Hz and no response at 4000 Hz.   Otoscopic inspection is within normal limits bilaterally. Tinnitus was very soft or not taken during his visit here and was not measured.    CONCLUSION:      David Mooney has a fairly symmetrical hearing loss that ranges from normal in the low frequencies sloping to moderate in the mid range and a moderately severe high-frequency hearing loss bilaterally.  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  Word recognition is good in each ear at normal conversational speech levels.  In minimal background noise his word recognition drops to poor in each ear, poor on the right side.  It is expected that David Mooney will miss 50 to 75% of what is said and most social settings because of his poor hearing in background noise.  Slight recruitment is present in each ear.  Middle ear volume, pressure  and compliance is within normal limits with more elevated acoustic reflexes on the right side compared to the left.  David Mooney may from amplification; therefore a hearing aid evaluation is recommended.  The test results were discussed and David Mooney counseled.  RECOMMENDATIONS: 1.   A hearing aid evaluation. Check with insurance to determine hearing aid benefit and coverage. 2.   Monitor hearing with a repeat audiological evaluation annually (earlier if there is any change in hearing or ear pressure).   3.   Consider further evaluation of the tinnitus by an Ear, Nose and Throat physician if  the tinnitus changes in pitch, frequency or loudness.  4.  Strategies that help improve hearing include: A) Face the speaker directly. Optimal is having the speakers face well - lit.  Unless amplified, being within 3-6 feet of the speaker will enhance word recognition. B) Avoid having the speaker back-lit as this will minimize the ability to use cues from lip-reading, facial expression and gestures. C)  Word recognition is poorer in background noise. For optimal word recognition, turn off the TV, radio or noisy fan when engaging in conversation. In a restaurant, try to sit away from noise sources and close to the primary speaker.  D)  Ask for topic clarification from time to time in order to remain in the conversation.  Most people don't mind repeating or clarifying a point when asked.  If needed, explain the difficulty hearing in background noise or hearing loss. 5.   Use hearing protection during noisy activities such as using a weed eater, moving the lawn, shooting, etc.   call the physician's office to confirm that you want the referral and to find out where the referral will be made.    Rain Friedt L. Heide Spark, Au.D., CCC-A Doctor of Audiology 09/05/2017  cc: Curlene Labrum, MD

## 2017-09-09 DIAGNOSIS — H26491 Other secondary cataract, right eye: Secondary | ICD-10-CM | POA: Diagnosis not present

## 2017-09-12 ENCOUNTER — Ambulatory Visit (INDEPENDENT_AMBULATORY_CARE_PROVIDER_SITE_OTHER): Payer: Medicare Other | Admitting: Internal Medicine

## 2017-09-12 ENCOUNTER — Encounter (INDEPENDENT_AMBULATORY_CARE_PROVIDER_SITE_OTHER): Payer: Self-pay | Admitting: Internal Medicine

## 2017-09-12 VITALS — BP 130/80 | HR 80 | Temp 98.0°F | Ht 66.0 in | Wt 192.9 lb

## 2017-09-12 DIAGNOSIS — K219 Gastro-esophageal reflux disease without esophagitis: Secondary | ICD-10-CM

## 2017-09-12 DIAGNOSIS — K5120 Ulcerative (chronic) proctitis without complications: Secondary | ICD-10-CM | POA: Diagnosis not present

## 2017-09-12 NOTE — Patient Instructions (Addendum)
Continue the Lialda. Continue the Nexium. OV in 1 year Labs today

## 2017-09-12 NOTE — Progress Notes (Signed)
Subjective:    Patient ID: David Mooney, male    DOB: 10-11-1940, 77 y.o.   MRN: [id number]  HPI Here today for f/u. Last seen in August of 2018. Hx of UC. Maintained on Lialda 2 tabs twice a day. Disease duration over 50 yrs. Also has hx of GERD and maintained on Nexium.  duration over 50 yrs. Last colonoscopy in 2014 revealed focal cecal inflammation.  Cecal biopsy shows active colitis. Tends to have gas after he eats.  He tells me he is doing good. His BM are moving okay. Has a BM 1-2 a day. No melena or BRRB. He occasionally skips having a BM sometimes.  Appetite is good. Mas gained about 1 pound since his last visit.  Review of Systems Past Medical History:  Diagnosis Date  . Cataract   . GERD (gastroesophageal reflux disease)   . Glaucoma   . Hernia of unspecified site of abdominal cavity without mention of obstruction or gangrene   . History of gastric ulcer   . Ulcerative colitis     Past Surgical History:  Procedure Laterality Date  . COLONOSCOPY    . COLONOSCOPY N/A 08/27/2012   Procedure: COLONOSCOPY;  Surgeon: Rogene Houston, MD;  Location: [facility];  Service: Endoscopy;  Laterality: N/A;  1200-moved to 13:15 Ann to notify pt  . HERNIA REPAIR    . MOLE REMOVAL     From head , chest  . UPPER GASTROINTESTINAL ENDOSCOPY      No Known Allergies  Current Outpatient Medications on File Prior to Visit  Medication Sig Dispense Refill  . acetaminophen (TYLENOL) 500 MG tablet Take 325 mg by mouth 2 (two) times daily as needed.     Marland Kitchen atorvastatin (LIPITOR) 20 MG tablet Take 10 mg by mouth daily.     . bimatoprost (LUMIGAN) 0.01 % SOLN 1 drop at bedtime. 1 drop in both eyes at bedtime    . Cholecalciferol (VITAMIN D) 2000 UNITS tablet Take 2,000 Units by mouth as needed.     Marland Kitchen esomeprazole (NEXIUM) 40 MG capsule Take 40 mg by mouth daily before breakfast.    . fluticasone (FLONASE) 50 MCG/ACT nasal spray Place 2 sprays into both nostrils daily.     . mesalamine  (LIALDA) 1.2 g EC tablet TAKE 2 TABLETS BY MOUTH TWICE DAILY 120 tablet 11  . MULTIPLE VITAMINS PO Take 1 tablet by mouth daily.     Marland Kitchen Phenyleph-CPM-DM-APAP (TYLENOL COLD MULTI-SYMPTOM PO) Take by mouth. Patient uses on as needed basis , when it is used it is twice a day.    . Probiotic Product (ALIGN PO) Take 1 tablet by mouth daily.     . solifenacin (VESICARE) 5 MG tablet Take 5 mg by mouth daily.    . tamsulosin (FLOMAX) 0.4 MG CAPS capsule Take 0.4 mg by mouth.     No current facility-administered medications on file prior to visit.         Objective:   Physical Exam Blood pressure 130/80, pulse 80, temperature 98 F (36.7 C), height 5' 6"  (1.676 m), weight 192 lb 14.4 oz (87.5 kg). Alert and oriented. Skin warm and dry. Oral mucosa is moist.   . Sclera anicteric, conjunctivae is pink. Thyroid not enlarged. No cervical lymphadenopathy. Lungs clear. Heart regular rate and rhythm.  Abdomen is soft. Bowel sounds are positive. No hepatomegaly.     No lower extremity edema.         Assessment & Plan:  UC.  Will get a CBC and CRP.  He is doing well. No GI complaints. GERD controlled with Nexium. OV in 1 year.

## 2017-09-13 LAB — CBC WITH DIFFERENTIAL/PLATELET
BASOS ABS: 51 {cells}/uL (ref 0–200)
BASOS PCT: 0.8 %
EOS ABS: 154 {cells}/uL (ref 15–500)
EOS PCT: 2.4 %
HEMATOCRIT: 37.5 % — AB (ref 38.5–50.0)
HEMOGLOBIN: 12.4 g/dL — AB (ref 13.2–17.1)
Lymphs Abs: 1824 cells/uL (ref 850–3900)
MCH: 30.5 pg (ref 27.0–33.0)
MCHC: 33.1 g/dL (ref 32.0–36.0)
MCV: 92.1 fL (ref 80.0–100.0)
MPV: 10.9 fL (ref 7.5–12.5)
Monocytes Relative: 10 %
NEUTROS PCT: 58.3 %
Neutro Abs: 3731 cells/uL (ref 1500–7800)
Platelets: 177 10*3/uL (ref 140–400)
RBC: 4.07 10*6/uL — ABNORMAL LOW (ref 4.20–5.80)
RDW: 12.6 % (ref 11.0–15.0)
Total Lymphocyte: 28.5 %
WBC mixed population: 640 cells/uL (ref 200–950)
WBC: 6.4 10*3/uL (ref 3.8–10.8)

## 2017-09-13 LAB — C-REACTIVE PROTEIN: CRP: 2.3 mg/L (ref <8.0)

## 2017-11-15 DIAGNOSIS — N183 Chronic kidney disease, stage 3 (moderate): Secondary | ICD-10-CM | POA: Diagnosis not present

## 2017-11-15 DIAGNOSIS — E782 Mixed hyperlipidemia: Secondary | ICD-10-CM | POA: Diagnosis not present

## 2017-11-15 DIAGNOSIS — R7301 Impaired fasting glucose: Secondary | ICD-10-CM | POA: Diagnosis not present

## 2017-11-15 DIAGNOSIS — K219 Gastro-esophageal reflux disease without esophagitis: Secondary | ICD-10-CM | POA: Diagnosis not present

## 2017-11-15 DIAGNOSIS — E559 Vitamin D deficiency, unspecified: Secondary | ICD-10-CM | POA: Diagnosis not present

## 2017-11-18 DIAGNOSIS — Z0001 Encounter for general adult medical examination with abnormal findings: Secondary | ICD-10-CM | POA: Diagnosis not present

## 2017-11-30 ENCOUNTER — Other Ambulatory Visit (INDEPENDENT_AMBULATORY_CARE_PROVIDER_SITE_OTHER): Payer: Self-pay | Admitting: Internal Medicine

## 2017-12-05 DIAGNOSIS — R0683 Snoring: Secondary | ICD-10-CM | POA: Diagnosis not present

## 2017-12-05 DIAGNOSIS — G4733 Obstructive sleep apnea (adult) (pediatric): Secondary | ICD-10-CM | POA: Diagnosis not present

## 2017-12-23 DIAGNOSIS — G4733 Obstructive sleep apnea (adult) (pediatric): Secondary | ICD-10-CM | POA: Diagnosis not present

## 2018-01-02 ENCOUNTER — Other Ambulatory Visit: Payer: Self-pay

## 2018-01-02 ENCOUNTER — Emergency Department (HOSPITAL_COMMUNITY): Payer: Medicare Other

## 2018-01-02 ENCOUNTER — Encounter (HOSPITAL_COMMUNITY): Payer: Self-pay | Admitting: Emergency Medicine

## 2018-01-02 ENCOUNTER — Observation Stay (HOSPITAL_COMMUNITY)
Admission: EM | Admit: 2018-01-02 | Discharge: 2018-01-04 | Disposition: A | Discharge status: Home / Self Care | Payer: Medicare Other | Attending: General Surgery | Admitting: General Surgery

## 2018-01-02 DIAGNOSIS — H8111 Benign paroxysmal vertigo, right ear: Secondary | ICD-10-CM | POA: Insufficient documentation

## 2018-01-02 DIAGNOSIS — K429 Umbilical hernia without obstruction or gangrene: Secondary | ICD-10-CM | POA: Insufficient documentation

## 2018-01-02 DIAGNOSIS — K760 Fatty (change of) liver, not elsewhere classified: Secondary | ICD-10-CM | POA: Insufficient documentation

## 2018-01-02 DIAGNOSIS — S0990XA Unspecified injury of head, initial encounter: Secondary | ICD-10-CM | POA: Diagnosis not present

## 2018-01-02 DIAGNOSIS — R2681 Unsteadiness on feet: Secondary | ICD-10-CM | POA: Insufficient documentation

## 2018-01-02 DIAGNOSIS — W19XXXA Unspecified fall, initial encounter: Secondary | ICD-10-CM

## 2018-01-02 DIAGNOSIS — S199XXA Unspecified injury of neck, initial encounter: Secondary | ICD-10-CM | POA: Diagnosis not present

## 2018-01-02 DIAGNOSIS — I700 Atherosclerosis of aorta: Secondary | ICD-10-CM | POA: Insufficient documentation

## 2018-01-02 DIAGNOSIS — R9431 Abnormal electrocardiogram [ECG] [EKG]: Secondary | ICD-10-CM | POA: Diagnosis not present

## 2018-01-02 DIAGNOSIS — Z79899 Other long term (current) drug therapy: Secondary | ICD-10-CM | POA: Diagnosis not present

## 2018-01-02 DIAGNOSIS — S3991XA Unspecified injury of abdomen, initial encounter: Secondary | ICD-10-CM | POA: Diagnosis not present

## 2018-01-02 DIAGNOSIS — S80812A Abrasion, left lower leg, initial encounter: Secondary | ICD-10-CM | POA: Diagnosis not present

## 2018-01-02 DIAGNOSIS — T07XXXA Unspecified multiple injuries, initial encounter: Secondary | ICD-10-CM

## 2018-01-02 DIAGNOSIS — S36039A Unspecified laceration of spleen, initial encounter: Secondary | ICD-10-CM

## 2018-01-02 DIAGNOSIS — S0081XA Abrasion of other part of head, initial encounter: Secondary | ICD-10-CM | POA: Insufficient documentation

## 2018-01-02 DIAGNOSIS — S27321A Contusion of lung, unilateral, initial encounter: Secondary | ICD-10-CM

## 2018-01-02 DIAGNOSIS — I2510 Atherosclerotic heart disease of native coronary artery without angina pectoris: Secondary | ICD-10-CM | POA: Diagnosis not present

## 2018-01-02 DIAGNOSIS — S2241XA Multiple fractures of ribs, right side, initial encounter for closed fracture: Secondary | ICD-10-CM

## 2018-01-02 DIAGNOSIS — R112 Nausea with vomiting, unspecified: Principal | ICD-10-CM | POA: Insufficient documentation

## 2018-01-02 DIAGNOSIS — Y92009 Unspecified place in unspecified non-institutional (private) residence as the place of occurrence of the external cause: Secondary | ICD-10-CM

## 2018-01-02 DIAGNOSIS — S0083XA Contusion of other part of head, initial encounter: Secondary | ICD-10-CM | POA: Diagnosis not present

## 2018-01-02 DIAGNOSIS — K219 Gastro-esophageal reflux disease without esophagitis: Secondary | ICD-10-CM | POA: Diagnosis not present

## 2018-01-02 DIAGNOSIS — W11XXXA Fall on and from ladder, initial encounter: Secondary | ICD-10-CM

## 2018-01-02 DIAGNOSIS — R918 Other nonspecific abnormal finding of lung field: Secondary | ICD-10-CM | POA: Diagnosis not present

## 2018-01-02 DIAGNOSIS — S36029A Unspecified contusion of spleen, initial encounter: Secondary | ICD-10-CM | POA: Insufficient documentation

## 2018-01-02 DIAGNOSIS — S299XXA Unspecified injury of thorax, initial encounter: Secondary | ICD-10-CM | POA: Diagnosis not present

## 2018-01-02 DIAGNOSIS — Z791 Long term (current) use of non-steroidal anti-inflammatories (NSAID): Secondary | ICD-10-CM | POA: Insufficient documentation

## 2018-01-02 DIAGNOSIS — S80811A Abrasion, right lower leg, initial encounter: Secondary | ICD-10-CM | POA: Insufficient documentation

## 2018-01-02 DIAGNOSIS — S3600XA Unspecified injury of spleen, initial encounter: Secondary | ICD-10-CM | POA: Diagnosis not present

## 2018-01-02 LAB — URINALYSIS, ROUTINE W REFLEX MICROSCOPIC
Bacteria, UA: NONE SEEN
Bilirubin Urine: NEGATIVE
Glucose, UA: NEGATIVE mg/dL
Ketones, ur: 5 mg/dL — AB
Leukocytes, UA: NEGATIVE
Nitrite: NEGATIVE
Protein, ur: NEGATIVE mg/dL
Specific Gravity, Urine: 1.011 (ref 1.005–1.030)
pH: 6 (ref 5.0–8.0)

## 2018-01-02 LAB — CBC WITH DIFFERENTIAL/PLATELET
Abs Immature Granulocytes: 0.06 10*3/uL (ref 0.00–0.07)
Basophils Absolute: 0 10*3/uL (ref 0.0–0.1)
Basophils Relative: 0 %
Eosinophils Absolute: 0 10*3/uL (ref 0.0–0.5)
Eosinophils Relative: 0 %
HEMATOCRIT: 40.8 % (ref 39.0–52.0)
Hemoglobin: 13.5 g/dL (ref 13.0–17.0)
Immature Granulocytes: 1 %
LYMPHS ABS: 0.9 10*3/uL (ref 0.7–4.0)
LYMPHS PCT: 9 %
MCH: 30 pg (ref 26.0–34.0)
MCHC: 33.1 g/dL (ref 30.0–36.0)
MCV: 90.7 fL (ref 80.0–100.0)
Monocytes Absolute: 0.5 10*3/uL (ref 0.1–1.0)
Monocytes Relative: 6 %
Neutro Abs: 7.7 10*3/uL (ref 1.7–7.7)
Neutrophils Relative %: 84 %
Platelets: 154 10*3/uL (ref 150–400)
RBC: 4.5 MIL/uL (ref 4.22–5.81)
RDW: 13.2 % (ref 11.5–15.5)
WBC: 9.1 10*3/uL (ref 4.0–10.5)
nRBC: 0 % (ref 0.0–0.2)

## 2018-01-02 LAB — PROTIME-INR
INR: 1.13
Prothrombin Time: 14.4 seconds (ref 11.4–15.2)

## 2018-01-02 LAB — COMPREHENSIVE METABOLIC PANEL
ALT: 31 U/L (ref 0–44)
AST: 27 U/L (ref 15–41)
Albumin: 4.4 g/dL (ref 3.5–5.0)
Alkaline Phosphatase: 39 U/L (ref 38–126)
Anion gap: 8 (ref 5–15)
BUN: 11 mg/dL (ref 8–23)
CO2: 22 mmol/L (ref 22–32)
Calcium: 9.6 mg/dL (ref 8.9–10.3)
Chloride: 107 mmol/L (ref 98–111)
Creatinine, Ser: 0.86 mg/dL (ref 0.61–1.24)
GFR calc Af Amer: >60 mL/min (ref >60)
GFR calc non Af Amer: >60 mL/min (ref >60)
Glucose, Bld: 148 mg/dL — ABNORMAL HIGH (ref 70–99)
Potassium: 4.5 mmol/L (ref 3.5–5.1)
Sodium: 137 mmol/L (ref 135–145)
Total Bilirubin: 1.1 mg/dL (ref 0.3–1.2)
Total Protein: 7.7 g/dL (ref 6.5–8.1)

## 2018-01-02 LAB — LIPASE, BLOOD: Lipase: 24 U/L (ref 11–51)

## 2018-01-02 LAB — ETHANOL: Alcohol, Ethyl (B): <10 mg/dL (ref <10)

## 2018-01-02 LAB — SAMPLE TO BLOOD BANK

## 2018-01-02 LAB — TROPONIN I: Troponin I: <0.03 ng/mL (ref <0.03)

## 2018-01-02 LAB — I-STAT CG4 LACTIC ACID, ED: Lactic Acid, Venous: 2 mmol/L (ref 0.5–1.9)

## 2018-01-02 MED ORDER — SODIUM CHLORIDE 0.9 % IV SOLN
INTRAVENOUS | Status: AC
  Administered 2018-01-02: 21:00:00 via INTRAVENOUS

## 2018-01-02 MED ORDER — TAMSULOSIN HCL 0.4 MG PO CAPS
0.4000 mg | ORAL_CAPSULE | Freq: Every day | ORAL | Status: DC
  Administered 2018-01-03 – 2018-01-04 (×2): 0.4 mg via ORAL
  Filled 2018-01-02 (×2): qty 1

## 2018-01-02 MED ORDER — ONDANSETRON HCL 4 MG/2ML IJ SOLN
4.0000 mg | Freq: Once | INTRAMUSCULAR | Status: AC
  Administered 2018-01-02: 4 mg via INTRAVENOUS
  Filled 2018-01-02: qty 2

## 2018-01-02 MED ORDER — TETANUS-DIPHTH-ACELL PERTUSSIS 5-2.5-18.5 LF-MCG/0.5 IM SUSP
0.5000 mL | Freq: Once | INTRAMUSCULAR | Status: AC
  Administered 2018-01-02: 0.5 mL via INTRAMUSCULAR
  Filled 2018-01-02: qty 0.5

## 2018-01-02 MED ORDER — MORPHINE SULFATE (PF) 2 MG/ML IV SOLN
2.0000 mg | INTRAVENOUS | Status: DC | PRN
  Administered 2018-01-02: 2 mg via INTRAVENOUS
  Filled 2018-01-02: qty 1

## 2018-01-02 MED ORDER — MESALAMINE 1.2 G PO TBEC
2.4000 g | DELAYED_RELEASE_TABLET | Freq: Two times a day (BID) | ORAL | Status: DC
  Administered 2018-01-03 – 2018-01-04 (×3): 2.4 g via ORAL
  Filled 2018-01-02 (×5): qty 2

## 2018-01-02 MED ORDER — IOPAMIDOL (ISOVUE-300) INJECTION 61%
100.0000 mL | Freq: Once | INTRAVENOUS | Status: AC | PRN
  Administered 2018-01-02: 100 mL via INTRAVENOUS

## 2018-01-02 MED ORDER — SODIUM CHLORIDE 0.9 % IV SOLN
INTRAVENOUS | Status: DC
  Administered 2018-01-02 – 2018-01-03 (×2): via INTRAVENOUS

## 2018-01-02 NOTE — ED Triage Notes (Signed)
Patient states that he fell off of a ladder Tuesday. Pt states he fell approximately 9 feet striking his chest and head. Pt states dizziness since fall and has problems ambulating; also states nausea starting this morning. Also states sharp pains in chest when taking a deep breath. Patient denies LOC from fall but admits that he was dazed for a bit.

## 2018-01-02 NOTE — ED Notes (Signed)
Pt states he does not need to urinate at this time, aware of DO

## 2018-01-02 NOTE — ED Provider Notes (Signed)
[facility] EMERGENCY DEPARTMENT Provider Note   CSN: [id number] Arrival date & time: 01/02/18  1259     History   Chief Complaint Chief Complaint  Patient presents with  . Dizziness  . Fall    HPI David Mooney is a 77 y.o. male.  HPI  Pt was seen at 1425. Per pt, c/o sudden onset and resolution of one episode of slip and fall off a ladder 2 days ago. Pt states he was up on a 9 foot ladder when it slipped and he fell forward to the ground. Pt states he hit his head, chest and abd on the ladder and ground. Denies LOC, but states he "was dazed a bit" after the fall.  Pt c/o head, chest and abd pain since the fall. Pt states he developed N/V this morning, as well as cough. Denies AMS, no neck or back pain, no palpitations, no cough, no diarrhea, no black or blood in stools or emesis, no fevers, no focal motor weakness, no tingling/numbness in extremities.     Past Medical History:  Diagnosis Date  . Cataract   . GERD (gastroesophageal reflux disease)   . Glaucoma   . Hernia of unspecified site of abdominal cavity without mention of obstruction or gangrene   . History of gastric ulcer   . Ulcerative colitis     Patient Active Problem List   Diagnosis Date Noted  . GERD (gastroesophageal reflux disease) 09/15/2013  . Flatulence 09/15/2013  . Ulcerative colitis ([place]) 07/24/2011  . Glaucoma 07/24/2011    Past Surgical History:  Procedure Laterality Date  . COLONOSCOPY    . COLONOSCOPY N/A 08/27/2012   Procedure: COLONOSCOPY;  Surgeon: Rogene Houston, MD;  Location: [facility] SUITE;  Service: Endoscopy;  Laterality: N/A;  1200-moved to 13:15 Ann to notify pt  . HERNIA REPAIR    . MOLE REMOVAL     From head , chest  . UPPER GASTROINTESTINAL ENDOSCOPY          Home Medications    Prior to Admission medications   Medication Sig Start Date End Date Taking? Authorizing Provider  acetaminophen (TYLENOL) 500 MG tablet Take 325 mg by mouth 2 (two) times daily as needed.     Yes [provider]  atorvastatin (LIPITOR) 10 MG tablet Take 10 mg by mouth every morning.    Yes [provider]  bimatoprost (LUMIGAN) 0.01 % SOLN Place 1 drop into both eyes at bedtime.    Yes [provider]  Cholecalciferol (VITAMIN D) 2000 UNITS tablet Take 2,000 Units by mouth daily.    Yes [provider]  esomeprazole (NEXIUM) 40 MG capsule Take 40 mg by mouth daily before breakfast.   Yes [provider]  fluticasone (FLONASE) 50 MCG/ACT nasal spray Place 2 sprays into both nostrils every morning.    Yes [provider]  mesalamine (LIALDA) 1.2 g EC tablet TAKE 2 TABLETS BY MOUTH TWICE DAILY Patient taking differently: Take 2.4 g by mouth 2 (two) times daily.  12/02/17  Yes Rehman, Mechele Dawley, MD  Multiple Vitamins tablet Take 1 tablet by mouth daily.    Yes [provider]  Naproxen Sodium 220 MG CAPS Take 220 mg by mouth daily as needed (for pain).   Yes [provider]  Phenyleph-CPM-DM-APAP (TYLENOL COLD MULTI-SYMPTOM PO) Take by mouth. Patient uses on as needed basis , when it is used it is twice a day.   Yes [provider]  Probiotic Product (ALIGN  PO) Take 1 tablet by mouth daily.    Yes [provider]  solifenacin (VESICARE) 10 MG tablet Take 10 mg by mouth every morning.    Yes [provider]  tamsulosin (FLOMAX) 0.4 MG CAPS capsule Take 0.4 mg by mouth every morning.    Yes [provider]    Family History Family History  Problem Relation Age of Onset  . Arrhythmia Mother   . Anuerysm Father   . Hypertension Father   . Diabetes Father   . Healthy Sister   . Healthy Sister   . Irritable bowel syndrome Daughter   . Thyroid disease Daughter     Social History Social History   Tobacco Use  . Smoking status: Never Smoker  . Smokeless tobacco: Never Used  Substance Use Topics  . Alcohol use: No  . Drug use: No     Allergies   Patient has no known  allergies.   Review of Systems Review of Systems ROS: Statement: All systems negative except as marked or noted in the HPI; Constitutional: Negative for fever and chills. ; ; Eyes: Negative for eye pain, redness and discharge. ; ; ENMT: Negative for ear pain, hoarseness, nasal congestion, sinus pressure and sore throat. ; ; Cardiovascular: Negative for palpitations, diaphoresis, dyspnea and peripheral edema. ; ; Respiratory: Negative for cough, wheezing and stridor. ; ; Gastrointestinal: +N/V, abd pain. Negative for diarrhea, blood in stool, hematemesis, jaundice and rectal bleeding. . ; ; Genitourinary: Negative for dysuria, flank pain and hematuria. ; ; Musculoskeletal: +head pain, chest pain. Negative for back pain and neck pain. Negative for swelling and deformity.; ; Skin: +abrasions. Negative for pruritus, rash, blisters, bruising and skin lesion.; ; Neuro: Negative for headache, lightheadedness and neck stiffness. Negative for weakness, altered level of consciousness, altered mental status, extremity weakness, paresthesias, involuntary movement, seizure and syncope.       Physical Exam Updated Vital Signs BP (!) 175/90 (BP Location: Left Arm)   Pulse 86   Temp 97.9 F (36.6 C) (Temporal)   Resp 15   Ht 5' 6.5" (1.689 m)   Wt 85.3 kg   SpO2 100%   BMI 29.89 kg/m    Patient Vitals for the past 24 hrs:  BP Temp Temp src Pulse Resp SpO2 Height Weight  01/02/18 1938 (!) 145/67 - - 84 (!) 23 96 % - -  01/02/18 1830 (!) 169/80 - - 67 18 100 % - -  01/02/18 1815 - - - 65 18 98 % - -  01/02/18 1800 (!) 144/68 - - 65 17 99 % - -  01/02/18 1745 - - - 63 12 98 % - -  01/02/18 1730 124/75 - - 65 19 98 % - -  01/02/18 1715 - - - - 17 - - -  01/02/18 1700 (!) 89/70 - - - 17 - - -  01/02/18 1645 - - - 68 (!) 23 99 % - -  01/02/18 1630 (!) 151/74 - - 63 13 98 % - -  01/02/18 1615 - - - 66 14 100 % - -  01/02/18 1600 (!) 149/66 - - 69 (!) 21 98 % - -  01/02/18 1545 - - - 69 (!) 22 96 % - -    01/02/18 1530 (!) 152/70 - - 67 16 100 % - -  01/02/18 1515 - - - 70 15 98 % - -  01/02/18 1500 (!) 155/84 - - 72 16 94 % - -  01/02/18  1458 (!) 175/90 - - 86 15 100 % - -  01/02/18 1317 (!) 148/85 97.9 F (36.6 C) Temporal 84 18 99 % 5' 6.5" (1.689 m) 85.3 kg      Physical Exam 1430: Physical examination: Vital signs and O2 SAT: Reviewed; Constitutional: Well developed, Well nourished, Well hydrated, In no acute distress; Head and Face: Normocephalic, +abrasion to center of forehead/scalp.; Eyes: EOMI, PERRL, No scleral icterus; ENMT: Mouth and pharynx normal, Left TM normal, Right TM normal, Mucous membranes moist; Neck: Supple, Trachea midline. No abrasions or ecchymosis.; Spine:  No midline CS, TS, LS tenderness.; Cardiovascular: Regular rate and rhythm, No gallop; Respiratory: Breath sounds clear & equal bilaterally, No wheezes, Normal respiratory effort/excursion. +moist cough during exam.; Chest: +lower bilat ribs tender to palp. No deformity, Movement normal, No crepitus, No abrasions or ecchymosis.; Abdomen: +N/V before my arrival to exam room. Soft, +upper abd tender to palp. No rebound or guarding. Nondistended, Normal bowel sounds, No abrasions or ecchymosis.; Genitourinary: No CVA tenderness; Rectal: No blood at urethral meatus, no perineal hematoma, no gross rectal bleeding.; Extremities: Full range of motion major/large joints of bilat UE's and LE's without pain or tenderness to palp, Neurovascularly intact, Pulses normal, No deformity. No tenderness, No edema, Pelvis stable. +scattered abrasions to arms and legs.; Neuro: AA&Ox3, GCS 15.  Major CN grossly intact. Speech clear. No gross focal motor or sensory deficits in extremities.; Skin: Color normal, Warm, Dry   ED Treatments / Results  Labs (all labs ordered are listed, but only abnormal results are displayed)   EKG EKG Interpretation  Date/Time:  Thursday January 02 2018 13:29:27 EST Ventricular Rate:  78 PR  Interval:    QRS Duration: 100 QT Interval:  418 QTC Calculation: 477 R Axis:   79 Text Interpretation:  Sinus rhythm Low voltage, precordial leads Abnormal R-wave progression, early transition Borderline T abnormalities, anterior leads Borderline prolonged QT interval Baseline wander No old tracing to compare Confirmed by Francine Graven [phone]) on 01/02/2018 2:36:40 PM   Radiology   Procedures Procedures (including critical care time)  Medications Ordered in ED Medications  0.9 %  sodium chloride infusion ( Intravenous New Bag/Given 01/02/18 1514)  ondansetron (ZOFRAN) injection 4 mg (4 mg Intravenous Given 01/02/18 1513)  Tdap (BOOSTRIX) injection 0.5 mL (0.5 mLs Intramuscular Given 01/02/18 1514)     Initial Impression / Assessment and Plan / ED Course  I have reviewed the triage vital signs and the nursing notes.  Pertinent labs & imaging results that were available during my care of the patient were reviewed by me and considered in my medical decision making (see chart for details).  MDM Reviewed: previous chart, nursing note and vitals Reviewed previous: labs and ECG Interpretation: labs, ECG, x-ray and CT scan Total time providing critical care: 30-74 minutes. This excludes time spent performing separately reportable procedures and services. Consults: trauma   CRITICAL CARE Performed by: Francine Graven Total critical care time: 35 minutes Critical care time was exclusive of separately billable procedures and treating other patients. Critical care was necessary to treat or prevent imminent or life-threatening deterioration. Critical care was time spent personally by me on the following activities: development of treatment plan with patient and/or surrogate as well as nursing, discussions with consultants, evaluation of patient's response to treatment, examination of patient, obtaining history from patient or surrogate, ordering and performing treatments and  interventions, ordering and review of laboratory studies, ordering and review of radiographic studies, pulse oximetry and re-evaluation of patient's condition.  Results for orders placed or performed during the hospital encounter of 01/02/18  Comprehensive metabolic panel  Result Value Ref Range   Sodium 137 135 - 145 mmol/L   Potassium 4.5 3.5 - 5.1 mmol/L   Chloride 107 98 - 111 mmol/L   CO2 22 22 - 32 mmol/L   Glucose, Bld 148 (H) 70 - 99 mg/dL   BUN 11 8 - 23 mg/dL   Creatinine, Ser 0.86 0.61 - 1.24 mg/dL   Calcium 9.6 8.9 - 10.3 mg/dL   Total Protein 7.7 6.5 - 8.1 g/dL   Albumin 4.4 3.5 - 5.0 g/dL   AST 27 15 - 41 U/L   ALT 31 0 - 44 U/L   Alkaline Phosphatase 39 38 - 126 U/L   Total Bilirubin 1.1 0.3 - 1.2 mg/dL   GFR calc non Af Amer >60 >60 mL/min   GFR calc Af Amer >60 >60 mL/min   Anion gap 8 5 - 15  Ethanol  Result Value Ref Range   Alcohol, Ethyl (B) <10 <10 mg/dL  Urinalysis, Routine w reflex microscopic  Result Value Ref Range   Color, Urine YELLOW YELLOW   APPearance CLEAR CLEAR   Specific Gravity, Urine 1.011 1.005 - 1.030   pH 6.0 5.0 - 8.0   Glucose, UA NEGATIVE NEGATIVE mg/dL   Hgb urine dipstick SMALL (A) NEGATIVE   Bilirubin Urine NEGATIVE NEGATIVE   Ketones, ur 5 (A) NEGATIVE mg/dL   Protein, ur NEGATIVE NEGATIVE mg/dL   Nitrite NEGATIVE NEGATIVE   Leukocytes, UA NEGATIVE NEGATIVE   RBC / HPF 6-10 0 - 5 RBC/hpf   WBC, UA 0-5 0 - 5 WBC/hpf   Bacteria, UA NONE SEEN NONE SEEN  Protime-INR  Result Value Ref Range   Prothrombin Time 14.4 11.4 - 15.2 seconds   INR 1.13   Troponin I - ONCE - STAT  Result Value Ref Range   Troponin I <0.03 <0.03 ng/mL  CBC WITH DIFFERENTIAL  Result Value Ref Range   WBC 9.1 4.0 - 10.5 K/uL   RBC 4.50 4.22 - 5.81 MIL/uL   Hemoglobin 13.5 13.0 - 17.0 g/dL   HCT 40.8 39.0 - 52.0 %   MCV 90.7 80.0 - 100.0 fL   MCH 30.0 26.0 - 34.0 pg   MCHC 33.1 30.0 - 36.0 g/dL   RDW 13.2 11.5 - 15.5 %   Platelets 154 150 - 400  K/uL   nRBC 0.0 0.0 - 0.2 %   Neutrophils Relative % 84 %   Neutro Abs 7.7 1.7 - 7.7 K/uL   Lymphocytes Relative 9 %   Lymphs Abs 0.9 0.7 - 4.0 K/uL   Monocytes Relative 6 %   Monocytes Absolute 0.5 0.1 - 1.0 K/uL   Eosinophils Relative 0 %   Eosinophils Absolute 0.0 0.0 - 0.5 K/uL   Basophils Relative 0 %   Basophils Absolute 0.0 0.0 - 0.1 K/uL   Immature Granulocytes 1 %   Abs Immature Granulocytes 0.06 0.00 - 0.07 K/uL  Lipase, blood  Result Value Ref Range   Lipase 24 11 - 51 U/L  I-Stat CG4 Lactic Acid, ED  Result Value Ref Range   Lactic Acid, Venous 2.00 (HH) 0.5 - 1.9 mmol/L   Comment NOTIFIED PHYSICIAN   Sample to Blood Bank  Result Value Ref Range   Blood Bank Specimen SAMPLE AVAILABLE FOR TESTING    Sample Expiration      01/05/2018 Performed at [facility], [address] [place], [place] [address]  Ct Head Wo Contrast Result Date: 01/02/2018 CLINICAL DATA:  [place] [address] 9 feet off a ladder on Tuesday, hitting his head. EXAM: CT HEAD WITHOUT CONTRAST CT CERVICAL SPINE WITHOUT CONTRAST TECHNIQUE: Multidetector CT imaging of the head and cervical spine was performed following the standard protocol without intravenous contrast. Multiplanar CT image reconstructions of the cervical spine were also generated. COMPARISON:  None. FINDINGS: CT HEAD FINDINGS Brain: No evidence of acute infarction, hemorrhage, hydrocephalus, extra-axial collection or mass lesion/mass effect. Mild age related cerebral atrophy. Vascular: Calcified atherosclerosis at the skullbase. No hyperdense vessel. Dolichoectasia of the basilar artery. Skull: Normal. Negative for fracture or focal lesion. Sinuses/Orbits: No acute finding. Other: None. CT CERVICAL SPINE FINDINGS Alignment: Facet mediated trace anterolisthesis at C7-T1. No traumatic malalignment. Skull base and vertebrae: No acute fracture. No primary bone lesion or focal pathologic process. Soft tissues and spinal canal: No prevertebral fluid or swelling.  No visible canal hematoma. Disc levels: Moderate disc height loss and uncovertebral hypertrophy at C3-C4, C4-C5, and C6-C7. Mild disc height loss at C5-C6 and C7-T1. Moderate bilateral facet arthropathy at C2-C3 and C7-T1. Upper chest: Negative. Other: None. IMPRESSION: 1.  No acute intracranial abnormality. 2.  No acute cervical spine fracture. Electronically Signed   By: Titus Dubin M.D.   On: 01/02/2018 19:34   Ct Chest W Contrast Addendum Date: 01/02/2018   ADDENDUM REPORT: 01/02/2018 20:05 ADDENDUM: Critical Value/emergent results were called by telephone at the time of interpretation on 01/02/2018 at 8:03 pm to Dr. Francine Graven , who verbally acknowledged these results. Electronically Signed   By: Claudie Revering M.D.   On: 01/02/2018 20:05  Result Date: 01/02/2018 CLINICAL DATA:  Sharp pains in the chest when taking a deep breath after falling approximately 9 ft from a ladder 2 days ago. Nausea since this morning. EXAM: CT CHEST, ABDOMEN, AND PELVIS WITH CONTRAST TECHNIQUE: Multidetector CT imaging of the chest, abdomen and pelvis was performed following the standard protocol during bolus administration of intravenous contrast. CONTRAST:  167m ISOVUE-300 IOPAMIDOL (ISOVUE-300) INJECTION 61% COMPARISON:  None. FINDINGS: CT CHEST FINDINGS Cardiovascular: Mild atheromatous calcifications, including the aorta and minimal coronary artery calcification. Normal sized heart. Mediastinum/Nodes: No enlarged mediastinal, hilar, or axillary lymph nodes. Thyroid gland, trachea, and esophagus demonstrate no significant findings. No mediastinal hemorrhage. Lungs/Pleura: Mild patchy density in the posterior aspect of the right lower lobe. Otherwise, minimal patchy opacity in the posterior aspect of the right middle lobe. The remainder of the lungs are clear. No pleural fluid or pneumothorax. Musculoskeletal: Essentially nondisplaced fractures of the anterolateral aspect of the right 7th rib and lateral aspects of  the right 8th, 9th and 10th ribs. Old, healed left lateral 10th rib fracture. Thoracic spine degenerative changes. CT ABDOMEN PELVIS FINDINGS Hepatobiliary: Mild diffuse low density of the liver relative to the spleen. Normal appearing gallbladder. Pancreas: Unremarkable. No pancreatic ductal dilatation or surrounding inflammatory changes. Spleen: 5 mm oval area of low density in the lateral aspect of the inferior portion of the spleen on coronal image number 119. 5 mm similar area more inferiorly on coronal image number 120. There is a slightly smaller and more faint similar area on axial image number 62 of series 2. There is a small slightly more linear area of low density in the posterolateral spleen inferiorly on axial image number 63 series 2. The remainder of the spleen has a normal appearance. Adrenals/Urinary Tract: Normal appearing adrenal glands. Small bilateral renal cysts. Normal appearing ureters and urinary bladder. Stomach/Bowel: Stomach is within  normal limits. Appendix appears normal. No evidence of bowel wall thickening, distention, or inflammatory changes. Vascular/Lymphatic: Atheromatous arterial calcifications without aneurysm. No enlarged lymph nodes. Reproductive: Prostate is unremarkable. Other: Small umbilical hernia containing fat. Musculoskeletal: Lumbar spine degenerative changes. These include facet degenerative changes with associated grade 1 anterolisthesis at the L5-S1 level. The vertebral bodies may be fused at that level. IMPRESSION: 1. Four small areas of low density in the subcapsular region of the inferior aspect of the spleen laterally, as described above. These may represent tiny peripheral lacerations associated with the patient's recent fall or associated with a previous injury. 2. Essentially nondisplaced fractures of the right 7th through 10th ribs. 3. Minimal patchy opacity in the right lower lobe and posterior aspect of the right middle lobe, as described above. These may  represent areas of mild pulmonary contusion. Pneumonia or patchy atelectasis could also have this appearance. 4. Mild diffuse hepatic steatosis. 5. Minimal calcific coronary artery atherosclerosis and mild calcific aortic atherosclerosis. Aortic Atherosclerosis (ICD10-I70.0). Electronically Signed: By: Claudie Revering M.D. On: 01/02/2018 19:46   Ct Cervical Spine Wo Contrast Result Date: 01/02/2018 CLINICAL DATA:  [place] [address] 9 feet off a ladder on Tuesday, hitting his head. EXAM: CT HEAD WITHOUT CONTRAST CT CERVICAL SPINE WITHOUT CONTRAST TECHNIQUE: Multidetector CT imaging of the head and cervical spine was performed following the standard protocol without intravenous contrast. Multiplanar CT image reconstructions of the cervical spine were also generated. COMPARISON:  None. FINDINGS: CT HEAD FINDINGS Brain: No evidence of acute infarction, hemorrhage, hydrocephalus, extra-axial collection or mass lesion/mass effect. Mild age related cerebral atrophy. Vascular: Calcified atherosclerosis at the skullbase. No hyperdense vessel. Dolichoectasia of the basilar artery. Skull: Normal. Negative for fracture or focal lesion. Sinuses/Orbits: No acute finding. Other: None. CT CERVICAL SPINE FINDINGS Alignment: Facet mediated trace anterolisthesis at C7-T1. No traumatic malalignment. Skull base and vertebrae: No acute fracture. No primary bone lesion or focal pathologic process. Soft tissues and spinal canal: No prevertebral fluid or swelling. No visible canal hematoma. Disc levels: Moderate disc height loss and uncovertebral hypertrophy at C3-C4, C4-C5, and C6-C7. Mild disc height loss at C5-C6 and C7-T1. Moderate bilateral facet arthropathy at C2-C3 and C7-T1. Upper chest: Negative. Other: None. IMPRESSION: 1.  No acute intracranial abnormality. 2.  No acute cervical spine fracture. Electronically Signed   By: Titus Dubin M.D.   On: 01/02/2018 19:34   Ct Abdomen Pelvis W Contrast Addendum Date: 01/02/2018   ADDENDUM  REPORT: 01/02/2018 20:05 ADDENDUM: Critical Value/emergent results were called by telephone at the time of interpretation on 01/02/2018 at 8:03 pm to Dr. Francine Graven , who verbally acknowledged these results. Electronically Signed   By: Claudie Revering M.D.   On: 01/02/2018 20:05  Result Date: 01/02/2018 CLINICAL DATA:  Sharp pains in the chest when taking a deep breath after falling approximately 9 ft from a ladder 2 days ago. Nausea since this morning. EXAM: CT CHEST, ABDOMEN, AND PELVIS WITH CONTRAST TECHNIQUE: Multidetector CT imaging of the chest, abdomen and pelvis was performed following the standard protocol during bolus administration of intravenous contrast. CONTRAST:  126m ISOVUE-300 IOPAMIDOL (ISOVUE-300) INJECTION 61% COMPARISON:  None. FINDINGS: CT CHEST FINDINGS Cardiovascular: Mild atheromatous calcifications, including the aorta and minimal coronary artery calcification. Normal sized heart. Mediastinum/Nodes: No enlarged mediastinal, hilar, or axillary lymph nodes. Thyroid gland, trachea, and esophagus demonstrate no significant findings. No mediastinal hemorrhage. Lungs/Pleura: Mild patchy density in the posterior aspect of the right lower lobe. Otherwise, minimal patchy opacity in the posterior aspect of  the right middle lobe. The remainder of the lungs are clear. No pleural fluid or pneumothorax. Musculoskeletal: Essentially nondisplaced fractures of the anterolateral aspect of the right 7th rib and lateral aspects of the right 8th, 9th and 10th ribs. Old, healed left lateral 10th rib fracture. Thoracic spine degenerative changes. CT ABDOMEN PELVIS FINDINGS Hepatobiliary: Mild diffuse low density of the liver relative to the spleen. Normal appearing gallbladder. Pancreas: Unremarkable. No pancreatic ductal dilatation or surrounding inflammatory changes. Spleen: 5 mm oval area of low density in the lateral aspect of the inferior portion of the spleen on coronal image number 119. 5 mm similar  area more inferiorly on coronal image number 120. There is a slightly smaller and more faint similar area on axial image number 62 of series 2. There is a small slightly more linear area of low density in the posterolateral spleen inferiorly on axial image number 63 series 2. The remainder of the spleen has a normal appearance. Adrenals/Urinary Tract: Normal appearing adrenal glands. Small bilateral renal cysts. Normal appearing ureters and urinary bladder. Stomach/Bowel: Stomach is within normal limits. Appendix appears normal. No evidence of bowel wall thickening, distention, or inflammatory changes. Vascular/Lymphatic: Atheromatous arterial calcifications without aneurysm. No enlarged lymph nodes. Reproductive: Prostate is unremarkable. Other: Small umbilical hernia containing fat. Musculoskeletal: Lumbar spine degenerative changes. These include facet degenerative changes with associated grade 1 anterolisthesis at the L5-S1 level. The vertebral bodies may be fused at that level. IMPRESSION: 1. Four small areas of low density in the subcapsular region of the inferior aspect of the spleen laterally, as described above. These may represent tiny peripheral lacerations associated with the patient's recent fall or associated with a previous injury. 2. Essentially nondisplaced fractures of the right 7th through 10th ribs. 3. Minimal patchy opacity in the right lower lobe and posterior aspect of the right middle lobe, as described above. These may represent areas of mild pulmonary contusion. Pneumonia or patchy atelectasis could also have this appearance. 4. Mild diffuse hepatic steatosis. 5. Minimal calcific coronary artery atherosclerosis and mild calcific aortic atherosclerosis. Aortic Atherosclerosis (ICD10-I70.0). Electronically Signed: By: Claudie Revering M.D. On: 01/02/2018 19:46   Dg Chest Port 1 View Result Date: 01/02/2018 CLINICAL DATA:  Fall 2 days ago EXAM: PORTABLE CHEST 1 VIEW COMPARISON:  None.  FINDINGS: The heart size and mediastinal contours are within normal limits. Both lungs are clear. The visualized skeletal structures are unremarkable. IMPRESSION: No active disease. Electronically Signed   By: Franchot Gallo M.D.   On: 01/02/2018 15:14    2000:  CT's as above. Pt otherwise stable: remains A&O, resps without distress, no O2 requirement at this time with Sats 95%< on R/A. Dx and testing d/w pt and family.  Questions answered.  Verb understanding, agreeable to transfer/admit to [facility].  T/C returned from [place]. [place] Silos, case discussed, including:  HPI, pertinent PM/SHx, VS/PE, dx testing, ED course and treatment:  Agreeable to accept transfer/admit, requests to write temporary orders, obtain medical bed to team Trauma MD.    Final Clinical Impressions(s) / ED Diagnoses   Final diagnoses:  None    ED Discharge Orders    None       Francine Graven, DO 01/03/18 2332

## 2018-01-02 NOTE — H&P (Signed)
ILYAAS Mooney is an 77 y.o. male.   Chief Complaint: R lower chest wall pain, N/V HPI: David Mooney was up on a ladder with a blower blowing leaves out of his gutters 2 days ago.  The ladder began to slide and he rode the ladder down to the ground hitting his head and chest.  He had some lower chest wall pain.  No loss of consciousness.  He dealt with the pain at home for 2 days but developed nausea and vomiting today so he went to the [facility] emergency department.  Work-up there revealed multiple right-sided rib fractures and a splenic injury.  I accepted him in transfer to the trauma service.  He denies abdominal pain or nausea at this time.  He complains of right lower chest wall pain.  No shortness of breath.  Past Medical History:  Diagnosis Date  . Cataract   . GERD (gastroesophageal reflux disease)   . Glaucoma   . Hernia of unspecified site of abdominal cavity without mention of obstruction or gangrene   . History of gastric ulcer   . Ulcerative colitis     Past Surgical History:  Procedure Laterality Date  . COLONOSCOPY    . COLONOSCOPY N/A 08/27/2012   Procedure: COLONOSCOPY;  Surgeon: Rogene Houston, MD;  Location: [facility] SUITE;  Service: Endoscopy;  Laterality: N/A;  1200-moved to 13:15 Ann to notify pt  . HERNIA REPAIR    . MOLE REMOVAL     From head , chest  . UPPER GASTROINTESTINAL ENDOSCOPY      Family History  Problem Relation Age of Onset  . Arrhythmia Mother   . Anuerysm Father   . Hypertension Father   . Diabetes Father   . Healthy Sister   . Healthy Sister   . Irritable bowel syndrome Daughter   . Thyroid disease Daughter    Social History:  reports that he has never smoked. He has never used smokeless tobacco. He reports that he does not drink alcohol or use drugs.  Allergies: No Known Allergies  Medications Prior to Admission  Medication Sig Dispense Refill  . acetaminophen (TYLENOL) 500 MG tablet Take 325 mg by mouth 2 (two) times daily as  needed.     Marland Kitchen atorvastatin (LIPITOR) 10 MG tablet Take 10 mg by mouth every morning.     . bimatoprost (LUMIGAN) 0.01 % SOLN Place 1 drop into both eyes at bedtime.     . Cholecalciferol (VITAMIN D) 2000 UNITS tablet Take 2,000 Units by mouth daily.     Marland Kitchen esomeprazole (NEXIUM) 40 MG capsule Take 40 mg by mouth daily before breakfast.    . fluticasone (FLONASE) 50 MCG/ACT nasal spray Place 2 sprays into both nostrils every morning.     . mesalamine (LIALDA) 1.2 g EC tablet TAKE 2 TABLETS BY MOUTH TWICE DAILY (Patient taking differently: Take 2.4 g by mouth 2 (two) times daily. ) 120 tablet 5  . Multiple Vitamins tablet Take 1 tablet by mouth daily.     . Naproxen Sodium 220 MG CAPS Take 220 mg by mouth daily as needed (for pain).    . Phenyleph-CPM-DM-APAP (TYLENOL COLD MULTI-SYMPTOM PO) Take by mouth. Patient uses on as needed basis , when it is used it is twice a day.    . Probiotic Product (ALIGN PO) Take 1 tablet by mouth daily.     . solifenacin (VESICARE) 10 MG tablet Take 10 mg by mouth every morning.     Marland Kitchen  tamsulosin (FLOMAX) 0.4 MG CAPS capsule Take 0.4 mg by mouth every morning.       Results for orders placed or performed during the hospital encounter of 01/02/18 (from the past 48 hour(s))  Urinalysis, Routine w reflex microscopic     Status: Abnormal   Collection Time: 01/02/18  2:33 PM  Result Value Ref Range   Color, Urine YELLOW YELLOW   APPearance CLEAR CLEAR   Specific Gravity, Urine 1.011 1.005 - 1.030   pH 6.0 5.0 - 8.0   Glucose, UA NEGATIVE NEGATIVE mg/dL   Hgb urine dipstick SMALL (A) NEGATIVE   Bilirubin Urine NEGATIVE NEGATIVE   Ketones, ur 5 (A) NEGATIVE mg/dL   Protein, ur NEGATIVE NEGATIVE mg/dL   Nitrite NEGATIVE NEGATIVE   Leukocytes, UA NEGATIVE NEGATIVE   RBC / HPF 6-10 0 - 5 RBC/hpf   WBC, UA 0-5 0 - 5 WBC/hpf   Bacteria, UA NONE SEEN NONE SEEN    Comment: Performed at [facility], [address] [place], [place] [address]  Comprehensive metabolic  panel     Status: Abnormal   Collection Time: 01/02/18  2:54 PM  Result Value Ref Range   Sodium 137 135 - 145 mmol/L   Potassium 4.5 3.5 - 5.1 mmol/L   Chloride 107 98 - 111 mmol/L   CO2 22 22 - 32 mmol/L   Glucose, Bld 148 (H) 70 - 99 mg/dL   BUN 11 8 - 23 mg/dL   Creatinine, Ser 0.86 0.61 - 1.24 mg/dL   Calcium 9.6 8.9 - 10.3 mg/dL   Total Protein 7.7 6.5 - 8.1 g/dL   Albumin 4.4 3.5 - 5.0 g/dL   AST 27 15 - 41 U/L   ALT 31 0 - 44 U/L   Alkaline Phosphatase 39 38 - 126 U/L   Total Bilirubin 1.1 0.3 - 1.2 mg/dL   GFR calc non Af Amer >60 >60 mL/min   GFR calc Af Amer >60 >60 mL/min   Anion gap 8 5 - 15    Comment: Performed at [facility], [address] [place], [place] [address]  Ethanol     Status: None   Collection Time: 01/02/18  2:54 PM  Result Value Ref Range   Alcohol, Ethyl (B) <10 <10 mg/dL    Comment: (NOTE) Lowest detectable limit for serum alcohol is 10 mg/dL. For medical purposes only. Performed at [facility], [address] [place], [place] [address]   Protime-INR     Status: None   Collection Time: 01/02/18  2:54 PM  Result Value Ref Range   Prothrombin Time 14.4 11.4 - 15.2 seconds   INR 1.13     Comment: Performed at [facility], [address], [place], [place] [address]  Troponin I - ONCE - STAT     Status: None   Collection Time: 01/02/18  2:54 PM  Result Value Ref Range   Troponin I <0.03 <0.03 ng/mL    Comment: Performed at [facility], [address] [place], [place] [address]  CBC WITH DIFFERENTIAL     Status: None   Collection Time: 01/02/18  2:54 PM  Result Value Ref Range   WBC 9.1 4.0 - 10.5 K/uL   RBC 4.50 4.22 - 5.81 MIL/uL   Hemoglobin 13.5 13.0 - 17.0 g/dL   HCT 40.8 39.0 - 52.0 %   MCV 90.7 80.0 - 100.0 fL   MCH 30.0 26.0 - 34.0 pg   MCHC 33.1 30.0 - 36.0 g/dL   RDW  13.2 11.5 - 15.5 %   Platelets 154 150 - 400 K/uL   nRBC 0.0 0.0 - 0.2 %   Neutrophils Relative % 84 %   Neutro Abs 7.7 1.7 - 7.7 K/uL   Lymphocytes Relative  9 %   Lymphs Abs 0.9 0.7 - 4.0 K/uL   Monocytes Relative 6 %   Monocytes Absolute 0.5 0.1 - 1.0 K/uL   Eosinophils Relative 0 %   Eosinophils Absolute 0.0 0.0 - 0.5 K/uL   Basophils Relative 0 %   Basophils Absolute 0.0 0.0 - 0.1 K/uL   Immature Granulocytes 1 %   Abs Immature Granulocytes 0.06 0.00 - 0.07 K/uL    Comment: Performed at [facility], [address], [place], [place] [address]  Lipase, blood     Status: None   Collection Time: 01/02/18  2:54 PM  Result Value Ref Range   Lipase 24 11 - 51 U/L    Comment: Performed at [facility], [address], [place], [place] [address]  Sample to Blood Bank     Status: None   Collection Time: 01/02/18  2:55 PM  Result Value Ref Range   Blood Bank Specimen SAMPLE AVAILABLE FOR TESTING    Sample Expiration      01/05/2018 Performed at [facility], [address] [place], [place] [address]   I-Stat CG4 Lactic Acid, ED     Status: Abnormal   Collection Time: 01/02/18  3:17 PM  Result Value Ref Range   Lactic Acid, Venous 2.00 (HH) 0.5 - 1.9 mmol/L   Comment NOTIFIED PHYSICIAN    Ct Head Wo Contrast  Result Date: 01/02/2018 CLINICAL DATA:  [address] 9 feet off a ladder on Tuesday, hitting his head. EXAM: CT HEAD WITHOUT CONTRAST CT CERVICAL SPINE WITHOUT CONTRAST TECHNIQUE: Multidetector CT imaging of the head and cervical spine was performed following the standard protocol without intravenous contrast. Multiplanar CT image reconstructions of the cervical spine were also generated. COMPARISON:  None. FINDINGS: CT HEAD FINDINGS Brain: No evidence of acute infarction, hemorrhage, hydrocephalus, extra-axial collection or mass lesion/mass effect. Mild age related cerebral atrophy. Vascular: Calcified atherosclerosis at the skullbase. No hyperdense vessel. Dolichoectasia of the basilar artery. Skull: Normal. Negative for fracture or focal lesion. Sinuses/Orbits: No acute finding. Other: None. CT CERVICAL SPINE FINDINGS Alignment: Facet mediated  trace anterolisthesis at C7-T1. No traumatic malalignment. Skull base and vertebrae: No acute fracture. No primary bone lesion or focal pathologic process. Soft tissues and spinal canal: No prevertebral fluid or swelling. No visible canal hematoma. Disc levels: Moderate disc height loss and uncovertebral hypertrophy at C3-C4, C4-C5, and C6-C7. Mild disc height loss at C5-C6 and C7-T1. Moderate bilateral facet arthropathy at C2-C3 and C7-T1. Upper chest: Negative. Other: None. IMPRESSION: 1.  No acute intracranial abnormality. 2.  No acute cervical spine fracture. Electronically Signed   By: Titus Dubin M.D.   On: 01/02/2018 19:34   Ct Chest W Contrast  Addendum Date: 01/02/2018   ADDENDUM REPORT: 01/02/2018 20:05 ADDENDUM: Critical Value/emergent results were called by telephone at the time of interpretation on 01/02/2018 at 8:03 pm to Dr. Francine Graven , who verbally acknowledged these results. Electronically Signed   By: Claudie Revering M.D.   On: 01/02/2018 20:05   Result Date: 01/02/2018 CLINICAL DATA:  Sharp pains in the chest when taking a deep breath after falling approximately 9 ft from a ladder 2 days ago. Nausea since this morning. EXAM: CT CHEST, ABDOMEN, AND PELVIS WITH CONTRAST TECHNIQUE: Multidetector CT imaging  of the chest, abdomen and pelvis was performed following the standard protocol during bolus administration of intravenous contrast. CONTRAST:  117m ISOVUE-300 IOPAMIDOL (ISOVUE-300) INJECTION 61% COMPARISON:  None. FINDINGS: CT CHEST FINDINGS Cardiovascular: Mild atheromatous calcifications, including the aorta and minimal coronary artery calcification. Normal sized heart. Mediastinum/Nodes: No enlarged mediastinal, hilar, or axillary lymph nodes. Thyroid gland, trachea, and esophagus demonstrate no significant findings. No mediastinal hemorrhage. Lungs/Pleura: Mild patchy density in the posterior aspect of the right lower lobe. Otherwise, minimal patchy opacity in the posterior  aspect of the right middle lobe. The remainder of the lungs are clear. No pleural fluid or pneumothorax. Musculoskeletal: Essentially nondisplaced fractures of the anterolateral aspect of the right 7th rib and lateral aspects of the right 8th, 9th and 10th ribs. Old, healed left lateral 10th rib fracture. Thoracic spine degenerative changes. CT ABDOMEN PELVIS FINDINGS Hepatobiliary: Mild diffuse low density of the liver relative to the spleen. Normal appearing gallbladder. Pancreas: Unremarkable. No pancreatic ductal dilatation or surrounding inflammatory changes. Spleen: 5 mm oval area of low density in the lateral aspect of the inferior portion of the spleen on coronal image number 119. 5 mm similar area more inferiorly on coronal image number 120. There is a slightly smaller and more faint similar area on axial image number 62 of series 2. There is a small slightly more linear area of low density in the posterolateral spleen inferiorly on axial image number 63 series 2. The remainder of the spleen has a normal appearance. Adrenals/Urinary Tract: Normal appearing adrenal glands. Small bilateral renal cysts. Normal appearing ureters and urinary bladder. Stomach/Bowel: Stomach is within normal limits. Appendix appears normal. No evidence of bowel wall thickening, distention, or inflammatory changes. Vascular/Lymphatic: Atheromatous arterial calcifications without aneurysm. No enlarged lymph nodes. Reproductive: Prostate is unremarkable. Other: Small umbilical hernia containing fat. Musculoskeletal: Lumbar spine degenerative changes. These include facet degenerative changes with associated grade 1 anterolisthesis at the L5-S1 level. The vertebral bodies may be fused at that level. IMPRESSION: 1. Four small areas of low density in the subcapsular region of the inferior aspect of the spleen laterally, as described above. These may represent tiny peripheral lacerations associated with the patient's recent fall or  associated with a previous injury. 2. Essentially nondisplaced fractures of the right 7th through 10th ribs. 3. Minimal patchy opacity in the right lower lobe and posterior aspect of the right middle lobe, as described above. These may represent areas of mild pulmonary contusion. Pneumonia or patchy atelectasis could also have this appearance. 4. Mild diffuse hepatic steatosis. 5. Minimal calcific coronary artery atherosclerosis and mild calcific aortic atherosclerosis. Aortic Atherosclerosis (ICD10-I70.0). Electronically Signed: By: SClaudie ReveringM.D. On: 01/02/2018 19:46   Ct Cervical Spine Wo Contrast  Result Date: 01/02/2018 CLINICAL DATA:  [place] feet off a ladder on Tuesday, hitting his head. EXAM: CT HEAD WITHOUT CONTRAST CT CERVICAL SPINE WITHOUT CONTRAST TECHNIQUE: Multidetector CT imaging of the head and cervical spine was performed following the standard protocol without intravenous contrast. Multiplanar CT image reconstructions of the cervical spine were also generated. COMPARISON:  None. FINDINGS: CT HEAD FINDINGS Brain: No evidence of acute infarction, hemorrhage, hydrocephalus, extra-axial collection or mass lesion/mass effect. Mild age related cerebral atrophy. Vascular: Calcified atherosclerosis at the skullbase. No hyperdense vessel. Dolichoectasia of the basilar artery. Skull: Normal. Negative for fracture or focal lesion. Sinuses/Orbits: No acute finding. Other: None. CT CERVICAL SPINE FINDINGS Alignment: Facet mediated trace anterolisthesis at C7-T1. No traumatic malalignment. Skull base and vertebrae: No acute fracture. No  primary bone lesion or focal pathologic process. Soft tissues and spinal canal: No prevertebral fluid or swelling. No visible canal hematoma. Disc levels: Moderate disc height loss and uncovertebral hypertrophy at C3-C4, C4-C5, and C6-C7. Mild disc height loss at C5-C6 and C7-T1. Moderate bilateral facet arthropathy at C2-C3 and C7-T1. Upper chest: Negative. Other: None.  IMPRESSION: 1.  No acute intracranial abnormality. 2.  No acute cervical spine fracture. Electronically Signed   By: Titus Dubin M.D.   On: 01/02/2018 19:34   Ct Abdomen Pelvis W Contrast  Addendum Date: 01/02/2018   ADDENDUM REPORT: 01/02/2018 20:05 ADDENDUM: Critical Value/emergent results were called by telephone at the time of interpretation on 01/02/2018 at 8:03 pm to Dr. Francine Graven , who verbally acknowledged these results. Electronically Signed   By: Claudie Revering M.D.   On: 01/02/2018 20:05   Result Date: 01/02/2018 CLINICAL DATA:  Sharp pains in the chest when taking a deep breath after falling approximately 9 ft from a ladder 2 days ago. Nausea since this morning. EXAM: CT CHEST, ABDOMEN, AND PELVIS WITH CONTRAST TECHNIQUE: Multidetector CT imaging of the chest, abdomen and pelvis was performed following the standard protocol during bolus administration of intravenous contrast. CONTRAST:  154m ISOVUE-300 IOPAMIDOL (ISOVUE-300) INJECTION 61% COMPARISON:  None. FINDINGS: CT CHEST FINDINGS Cardiovascular: Mild atheromatous calcifications, including the aorta and minimal coronary artery calcification. Normal sized heart. Mediastinum/Nodes: No enlarged mediastinal, hilar, or axillary lymph nodes. Thyroid gland, trachea, and esophagus demonstrate no significant findings. No mediastinal hemorrhage. Lungs/Pleura: Mild patchy density in the posterior aspect of the right lower lobe. Otherwise, minimal patchy opacity in the posterior aspect of the right middle lobe. The remainder of the lungs are clear. No pleural fluid or pneumothorax. Musculoskeletal: Essentially nondisplaced fractures of the anterolateral aspect of the right 7th rib and lateral aspects of the right 8th, 9th and 10th ribs. Old, healed left lateral 10th rib fracture. Thoracic spine degenerative changes. CT ABDOMEN PELVIS FINDINGS Hepatobiliary: Mild diffuse low density of the liver relative to the spleen. Normal appearing  gallbladder. Pancreas: Unremarkable. No pancreatic ductal dilatation or surrounding inflammatory changes. Spleen: 5 mm oval area of low density in the lateral aspect of the inferior portion of the spleen on coronal image number 119. 5 mm similar area more inferiorly on coronal image number 120. There is a slightly smaller and more faint similar area on axial image number 62 of series 2. There is a small slightly more linear area of low density in the posterolateral spleen inferiorly on axial image number 63 series 2. The remainder of the spleen has a normal appearance. Adrenals/Urinary Tract: Normal appearing adrenal glands. Small bilateral renal cysts. Normal appearing ureters and urinary bladder. Stomach/Bowel: Stomach is within normal limits. Appendix appears normal. No evidence of bowel wall thickening, distention, or inflammatory changes. Vascular/Lymphatic: Atheromatous arterial calcifications without aneurysm. No enlarged lymph nodes. Reproductive: Prostate is unremarkable. Other: Small umbilical hernia containing fat. Musculoskeletal: Lumbar spine degenerative changes. These include facet degenerative changes with associated grade 1 anterolisthesis at the L5-S1 level. The vertebral bodies may be fused at that level. IMPRESSION: 1. Four small areas of low density in the subcapsular region of the inferior aspect of the spleen laterally, as described above. These may represent tiny peripheral lacerations associated with the patient's recent fall or associated with a previous injury. 2. Essentially nondisplaced fractures of the right 7th through 10th ribs. 3. Minimal patchy opacity in the right lower lobe and posterior aspect of the right middle lobe, as described  above. These may represent areas of mild pulmonary contusion. Pneumonia or patchy atelectasis could also have this appearance. 4. Mild diffuse hepatic steatosis. 5. Minimal calcific coronary artery atherosclerosis and mild calcific aortic  atherosclerosis. Aortic Atherosclerosis (ICD10-I70.0). Electronically Signed: By: Claudie Revering M.D. On: 01/02/2018 19:46   Dg Chest Port 1 View  Result Date: 01/02/2018 CLINICAL DATA:  Fall 2 days ago EXAM: PORTABLE CHEST 1 VIEW COMPARISON:  None. FINDINGS: The heart size and mediastinal contours are within normal limits. Both lungs are clear. The visualized skeletal structures are unremarkable. IMPRESSION: No active disease. Electronically Signed   By: Franchot Gallo M.D.   On: 01/02/2018 15:14    Review of Systems  Constitutional: Negative for chills and fever.  HENT: Negative.   Eyes: Negative for blurred vision.  Respiratory: Negative for cough and shortness of breath.   Cardiovascular: Positive for chest pain.  Gastrointestinal: Positive for nausea and vomiting. Negative for abdominal pain.  Genitourinary: Negative.   Musculoskeletal: Negative.   Skin: Negative.   Neurological: Negative.   Endo/Heme/Allergies: Negative.   Psychiatric/Behavioral: Negative.     Blood pressure 130/72, pulse 88, temperature 99 F (37.2 C), temperature source Oral, resp. rate 18, height 5' 6.5" (1.689 m), weight 83.8 kg, SpO2 95 %. Physical Exam  Constitutional: He is oriented to person, place, and time. He appears well-developed and well-nourished. No distress.  HENT:  Head: Normocephalic. Head is with abrasion and with contusion.    Right Ear: Hearing and external ear normal.  Left Ear: Hearing and external ear normal.  Nose: Nose normal. No nasal deformity.  Mouth/Throat: Uvula is midline and oropharynx is clear and moist.  Forehead contusion and abrasion  Eyes: Pupils are equal, round, and reactive to light. EOM are normal.  Neck: Neck supple. No tracheal deviation present. No thyromegaly present.  Nontender  Cardiovascular: Normal rate, regular rhythm, normal heart sounds and intact distal pulses.  Respiratory: Effort normal and breath sounds normal. No respiratory distress. He has no  wheezes. He has no rales. He exhibits tenderness.  Right lower rib tenderness  GI: Soft. Bowel sounds are normal. He exhibits no distension. There is no abdominal tenderness. There is no rebound and no guarding.  Musculoskeletal:     Comments: Abrasions bilateral shin  Neurological: He is alert and oriented to person, place, and time. He displays no atrophy and no tremor. No cranial nerve deficit. He exhibits normal muscle tone. He displays no seizure activity. GCS eye subscore is 4. GCS verbal subscore is 5. GCS motor subscore is 6.  Psychiatric: He has a normal mood and affect.     Assessment/Plan Fall on ladder 2dPTA Right rib fractures 7-10 Multiple small splenic contusions consistent with grade 1 injury Forehead and bilateral shin abrasions N/V  Admit, pulmonary toilet, follow-up hemoglobin, clear liquids I spoke with his family.  Zenovia Jarred, MD 01/02/2018, 11:51 PM

## 2018-01-03 DIAGNOSIS — S2241XA Multiple fractures of ribs, right side, initial encounter for closed fracture: Secondary | ICD-10-CM | POA: Diagnosis not present

## 2018-01-03 DIAGNOSIS — S3600XA Unspecified injury of spleen, initial encounter: Secondary | ICD-10-CM | POA: Diagnosis not present

## 2018-01-03 LAB — CBC
HCT: 34.7 % — ABNORMAL LOW (ref 39.0–52.0)
HEMOGLOBIN: 11.6 g/dL — AB (ref 13.0–17.0)
MCH: 29.4 pg (ref 26.0–34.0)
MCHC: 33.4 g/dL (ref 30.0–36.0)
MCV: 88.1 fL (ref 80.0–100.0)
Platelets: 144 10*3/uL — ABNORMAL LOW (ref 150–400)
RBC: 3.94 MIL/uL — ABNORMAL LOW (ref 4.22–5.81)
RDW: 12.8 % (ref 11.5–15.5)
WBC: 12.3 10*3/uL — ABNORMAL HIGH (ref 4.0–10.5)
nRBC: 0 % (ref 0.0–0.2)

## 2018-01-03 MED ORDER — ONDANSETRON HCL 4 MG/2ML IJ SOLN
4.0000 mg | Freq: Four times a day (QID) | INTRAMUSCULAR | Status: DC | PRN
  Administered 2018-01-03 (×2): 4 mg via INTRAVENOUS
  Filled 2018-01-03 (×2): qty 2

## 2018-01-03 MED ORDER — ACETAMINOPHEN 325 MG PO TABS
650.0000 mg | ORAL_TABLET | Freq: Four times a day (QID) | ORAL | Status: DC
  Administered 2018-01-03 – 2018-01-04 (×4): 650 mg via ORAL
  Filled 2018-01-03 (×4): qty 2

## 2018-01-03 MED ORDER — METHOCARBAMOL 500 MG PO TABS
500.0000 mg | ORAL_TABLET | Freq: Three times a day (TID) | ORAL | Status: DC
  Administered 2018-01-03 – 2018-01-04 (×4): 500 mg via ORAL
  Filled 2018-01-03 (×4): qty 1

## 2018-01-03 MED ORDER — METHOCARBAMOL 1000 MG/10ML IJ SOLN
1000.0000 mg | Freq: Three times a day (TID) | INTRAVENOUS | Status: DC | PRN
  Filled 2018-01-03: qty 10

## 2018-01-03 MED ORDER — ONDANSETRON 4 MG PO TBDP: 4.0000 mg | ORAL_TABLET | Freq: Four times a day (QID) | ORAL | Status: DC | PRN

## 2018-01-03 MED ORDER — MORPHINE SULFATE (PF) 4 MG/ML IV SOLN: 4.0000 mg | INTRAVENOUS | Status: DC | PRN

## 2018-01-03 MED ORDER — TRAMADOL HCL 50 MG PO TABS
50.0000 mg | ORAL_TABLET | Freq: Four times a day (QID) | ORAL | Status: DC | PRN
  Administered 2018-01-03: 50 mg via ORAL
  Filled 2018-01-03: qty 1

## 2018-01-03 NOTE — Evaluation (Signed)
Occupational Therapy Evaluation and Discharge Patient Details Name: David Mooney MRN: [id number] DOB: [birth year] Today's Date: 01/03/2018    History of Present Illness David Mooney was up on a ladder with a blower blowing leaves out of his gutters 2 days ago.  The ladder began to slide and he rode the ladder down to the ground hitting his head and chest.  He had some lower chest wall pain.  No loss of consciousness.  He dealt with the pain at home for 2 days but developed N&V today so he went to the [facility] emergency department.  Work-up there revealed multiple right-sided rib fractures and a splenic injury.  Pt  transferred to [facility]   Clinical Impression   This 77 yo male admitted with above presents to acute OT with decreased balance and just feeling good due to dizziness when he moves--+nystagmus noted. I discussed safety concerns with pt and wife with showering and LBB/D and they verbalized understanding based off of how pt is does after seen by PT. No further OT needs we will sign off.    Follow Up Recommendations  No OT follow up;Supervision/Assistance - 24 hour    Equipment Recommendations  None recommended by OT       Precautions / Restrictions Precautions Precautions: Fall Restrictions Weight Bearing Restrictions: No      Mobility Bed Mobility Overal bed mobility: Needs Assistance Bed Mobility: Supine to Sit     Supine to sit: Supervision     General bed mobility comments: VCs to focus on a target once sitting up due to dizziness and eyes with nystagmus  Transfers Overall transfer level: Needs assistance Equipment used: Rolling walker (2 wheeled) Transfers: Sit to/from Stand Sit to Stand: Min assist         General transfer comment: Min A to ambulate into bathroom to use toilet and groom at sink.    Balance Overall balance assessment: Needs assistance Sitting-balance support: Feet supported;No upper extremity supported Sitting balance-Leahy Scale: Good     Standing balance support: During functional activity;No upper extremity supported Standing balance-Leahy Scale: Fair Standing balance comment: standing at sink to wash face and hands                           ADL either performed or assessed with clinical judgement   ADL Overall ADL's : Needs assistance/impaired Eating/Feeding: Independent;Sitting   Grooming: Wash/dry face;Wash/dry hands;Min guard;Standing   Upper Body Bathing: Set up;Supervision/ safety;Sitting   Lower Body Bathing: Min guard;Sit to/from stand   Upper Body Dressing : Supervision/safety;Set up;Sitting   Lower Body Dressing: Min guard;Sit to/from stand       Toileting- Water quality scientist and Hygiene: Minimal assistance;Sit to/from stand     Tub/Shower Transfer Details (indicate cue type and reason): We discussed that based on how he felt when he is D/C'd that a shower seat may need to be considered if he is still dizzy and feels off balance--he and wife verbalized understanding   General ADL Comments: Pt's wife reports she will A him prn for LBB/D if he cannot do it without bending forward     Vision Baseline Vision/History: Wears glasses Wears Glasses: Reading only              Pertinent Vitals/Pain Pain Assessment: No/denies pain     Hand Dominance Right   Extremity/Trunk Assessment Upper Extremity Assessment Upper Extremity Assessment: Overall WFL for tasks assessed  Communication Communication Communication: No difficulties   Cognition Arousal/Alertness: Awake/alert Behavior During Therapy: WFL for tasks assessed/performed                                   General Comments: Pt appears to processing information slowly and responding slowly at times. When asked he reports that this is true. We talked briefly about post concussive symptoms              Home Living Family/patient expects to be discharged to:: Private residence Living  Arrangements: Spouse/significant other Available Help at Discharge: Family;Available 24 hours/day Type of Home: House Home Access: Stairs to enter [employer] of Steps: 2 Entrance Stairs-Rails: None Home Layout: One level     Bathroom Shower/Tub: Walk-in shower;Door   [employer] Toilet: Standard     Home Equipment: Environmental consultant - 2 wheels          Prior Functioning/Environment Level of Independence: Independent                 OT Problem List: Impaired balance (sitting and/or standing)         OT Goals(Current goals can be found in the care plan section) Acute Rehab OT Goals Patient Stated Goal: hopeful for home tomorrow                AM-PAC OT "6 Clicks" Daily Activity     Outcome Measure Help from another person eating meals?: None Help from another person taking care of personal grooming?: A Little Help from another person toileting, which includes using toliet, bedpan, or urinal?: A Little Help from another person bathing (including washing, rinsing, drying)?: A Little Help from another person to put on and taking off regular upper body clothing?: A Little Help from another person to put on and taking off regular lower body clothing?: A Little 6 Click Score: 19   End of Session Equipment Utilized During Treatment: Gait belt  Activity Tolerance: Patient tolerated treatment well Patient left: in chair;with call bell/phone within reach;with family/visitor present  OT Visit Diagnosis: Unsteadiness on feet (R26.81);Dizziness and giddiness (R42)                Time: 3734-2876 OT Time Calculation (min): 37 min Charges:  OT General Charges $OT Visit: 1 Visit OT Evaluation $OT Eval Moderate Complexity: 1 Mod OT Treatments $Self Care/Home Management : 8-22 mins  Golden Circle, OTR/L Acute [employer] Pager [phone] Office [phone]     Almon Register 01/03/2018, 12:10 PM

## 2018-01-03 NOTE — Progress Notes (Addendum)
[facility]/Trauma Progress Note      Assessment/Plan Hx of UC  Fall on ladder 2dPTA Right rib fractures 7-10 - pulm toilet, pain control, IS Multiple small splenic contusions consistent with grade 1 injury - monitor Hgb Forehead and bilateral shin abrasions   FEN: FLD and advance as tolerated VTE: SCD's, lovenox on hold due to drop in Hg, monitor  ID: no abx, WBC 12.3, afebrile Foley: none Follow up: TBD  DISPO: PT/OT, pain control, pulm toilet, encourage ambulation    LOS: 0 days    Subjective: CC: rib pain  Pain is improved. Pt states he is pulling 1750cc on IS. He has been up to the bathroom but feels unsteady on his feet. No more nausea or vomiting. No abdominal pain. He had a BM since the accident and was passing gas.   Objective: Vital signs in last 24 hours: Temp:  [97.9 F (36.6 C)-99 F (37.2 C)] 98.2 F (36.8 C) (12/20 0642) Pulse Rate:  [63-95] 66 (12/20 0642) Resp:  [12-23] 18 (12/20 0642) BP: (89-175)/(64-90) 118/71 (12/20 0642) SpO2:  [94 %-100 %] 98 % (12/20 0642) Weight:  [83.8 kg-85.3 kg] 83.8 kg (12/19 2249) Last BM Date: 01/02/18  Intake/Output from previous day: 12/19 0701 - 12/20 0700 In: 1908.3 [P.O.:60; I.V.:1848.3] Out: -  Intake/Output this shift: No intake/output data recorded.  PE: Gen:  Alert, NAD, pleasant, cooperative Card:  RRR, no M/G/R heard Pulm:  CTA, no W/R/R, rate and effort normal Abd: Soft, NT/ND, +BS, no HSM Skin: no rashes noted, warm and dry   Anti-infectives: Anti-infectives (From admission, onward)   None      Lab Results:  Recent Labs    01/02/18 1454 01/03/18 0144  WBC 9.1 12.3*  HGB 13.5 11.6*  HCT 40.8 34.7*  PLT 154 144*   BMET Recent Labs    01/02/18 1454  NA 137  K 4.5  CL 107  CO2 22  GLUCOSE 148*  BUN 11  CREATININE 0.86  CALCIUM 9.6   PT/INR Recent Labs    01/02/18 1454  LABPROT 14.4  INR 1.13   CMP     Component Value Date/Time   NA 137 01/02/2018 1454    K 4.5 01/02/2018 1454   CL 107 01/02/2018 1454   CO2 22 01/02/2018 1454   GLUCOSE 148 (H) 01/02/2018 1454   BUN 11 01/02/2018 1454   CREATININE 0.86 01/02/2018 1454   CALCIUM 9.6 01/02/2018 1454   PROT 7.7 01/02/2018 1454   ALBUMIN 4.4 01/02/2018 1454   AST 27 01/02/2018 1454   ALT 31 01/02/2018 1454   ALKPHOS 39 01/02/2018 1454   BILITOT 1.1 01/02/2018 1454   GFRNONAA >60 01/02/2018 1454   GFRAA >60 01/02/2018 1454   Lipase     Component Value Date/Time   LIPASE 24 01/02/2018 1454    Studies/Results: Ct Head Wo Contrast  Result Date: 01/02/2018 CLINICAL DATA:  Golden Circle 9 feet off a ladder on Tuesday, hitting his head. EXAM: CT HEAD WITHOUT CONTRAST CT CERVICAL SPINE WITHOUT CONTRAST TECHNIQUE: Multidetector CT imaging of the head and cervical spine was performed following the standard protocol without intravenous contrast. Multiplanar CT image reconstructions of the cervical spine were also generated. COMPARISON:  None. FINDINGS: CT HEAD FINDINGS Brain: No evidence of acute infarction, hemorrhage, hydrocephalus, extra-axial collection or mass lesion/mass effect. Mild age related cerebral atrophy. Vascular: Calcified atherosclerosis at the skullbase. No hyperdense vessel. Dolichoectasia of the basilar artery. Skull: Normal. Negative for fracture or focal lesion. Sinuses/Orbits: No  acute finding. Other: None. CT CERVICAL SPINE FINDINGS Alignment: Facet mediated trace anterolisthesis at C7-T1. No traumatic malalignment. Skull base and vertebrae: No acute fracture. No primary bone lesion or focal pathologic process. Soft tissues and spinal canal: No prevertebral fluid or swelling. No visible canal hematoma. Disc levels: Moderate disc height loss and uncovertebral hypertrophy at C3-C4, C4-C5, and C6-C7. Mild disc height loss at C5-C6 and C7-T1. Moderate bilateral facet arthropathy at C2-C3 and C7-T1. Upper chest: Negative. Other: None. IMPRESSION: 1.  No acute intracranial abnormality. 2.  No  acute cervical spine fracture. Electronically Signed   By: Titus Dubin M.D.   On: 01/02/2018 19:34   Ct Chest W Contrast  Addendum Date: 01/02/2018   ADDENDUM REPORT: 01/02/2018 20:05 ADDENDUM: Critical Value/emergent results were called by telephone at the time of interpretation on 01/02/2018 at 8:03 pm to Dr. Francine Graven , who verbally acknowledged these results. Electronically Signed   By: Claudie Revering M.D.   On: 01/02/2018 20:05   Result Date: 01/02/2018 CLINICAL DATA:  Sharp pains in the chest when taking a deep breath after falling approximately 9 ft from a ladder 2 days ago. Nausea since this morning. EXAM: CT CHEST, ABDOMEN, AND PELVIS WITH CONTRAST TECHNIQUE: Multidetector CT imaging of the chest, abdomen and pelvis was performed following the standard protocol during bolus administration of intravenous contrast. CONTRAST:  143m ISOVUE-300 IOPAMIDOL (ISOVUE-300) INJECTION 61% COMPARISON:  None. FINDINGS: CT CHEST FINDINGS Cardiovascular: Mild atheromatous calcifications, including the aorta and minimal coronary artery calcification. Normal sized heart. Mediastinum/Nodes: No enlarged mediastinal, hilar, or axillary lymph nodes. Thyroid gland, trachea, and esophagus demonstrate no significant findings. No mediastinal hemorrhage. Lungs/Pleura: Mild patchy density in the posterior aspect of the right lower lobe. Otherwise, minimal patchy opacity in the posterior aspect of the right middle lobe. The remainder of the lungs are clear. No pleural fluid or pneumothorax. Musculoskeletal: Essentially nondisplaced fractures of the anterolateral aspect of the right 7th rib and lateral aspects of the right 8th, 9th and 10th ribs. Old, healed left lateral 10th rib fracture. Thoracic spine degenerative changes. CT ABDOMEN PELVIS FINDINGS Hepatobiliary: Mild diffuse low density of the liver relative to the spleen. Normal appearing gallbladder. Pancreas: Unremarkable. No pancreatic ductal dilatation or  surrounding inflammatory changes. Spleen: 5 mm oval area of low density in the lateral aspect of the inferior portion of the spleen on coronal image number 119. 5 mm similar area more inferiorly on coronal image number 120. There is a slightly smaller and more faint similar area on axial image number 62 of series 2. There is a small slightly more linear area of low density in the posterolateral spleen inferiorly on axial image number 63 series 2. The remainder of the spleen has a normal appearance. Adrenals/Urinary Tract: Normal appearing adrenal glands. Small bilateral renal cysts. Normal appearing ureters and urinary bladder. Stomach/Bowel: Stomach is within normal limits. Appendix appears normal. No evidence of bowel wall thickening, distention, or inflammatory changes. Vascular/Lymphatic: Atheromatous arterial calcifications without aneurysm. No enlarged lymph nodes. Reproductive: Prostate is unremarkable. Other: Small umbilical hernia containing fat. Musculoskeletal: Lumbar spine degenerative changes. These include facet degenerative changes with associated grade 1 anterolisthesis at the L5-S1 level. The vertebral bodies may be fused at that level. IMPRESSION: 1. Four small areas of low density in the subcapsular region of the inferior aspect of the spleen laterally, as described above. These may represent tiny peripheral lacerations associated with the patient's recent fall or associated with a previous injury. 2. Essentially nondisplaced fractures of  the right 7th through 10th ribs. 3. Minimal patchy opacity in the right lower lobe and posterior aspect of the right middle lobe, as described above. These may represent areas of mild pulmonary contusion. Pneumonia or patchy atelectasis could also have this appearance. 4. Mild diffuse hepatic steatosis. 5. Minimal calcific coronary artery atherosclerosis and mild calcific aortic atherosclerosis. Aortic Atherosclerosis (ICD10-I70.0). Electronically Signed: By:  Claudie Revering M.D. On: 01/02/2018 19:46   Ct Cervical Spine Wo Contrast  Result Date: 01/02/2018 CLINICAL DATA:  Golden Circle 9 feet off a ladder on Tuesday, hitting his head. EXAM: CT HEAD WITHOUT CONTRAST CT CERVICAL SPINE WITHOUT CONTRAST TECHNIQUE: Multidetector CT imaging of the head and cervical spine was performed following the standard protocol without intravenous contrast. Multiplanar CT image reconstructions of the cervical spine were also generated. COMPARISON:  None. FINDINGS: CT HEAD FINDINGS Brain: No evidence of acute infarction, hemorrhage, hydrocephalus, extra-axial collection or mass lesion/mass effect. Mild age related cerebral atrophy. Vascular: Calcified atherosclerosis at the skullbase. No hyperdense vessel. Dolichoectasia of the basilar artery. Skull: Normal. Negative for fracture or focal lesion. Sinuses/Orbits: No acute finding. Other: None. CT CERVICAL SPINE FINDINGS Alignment: Facet mediated trace anterolisthesis at C7-T1. No traumatic malalignment. Skull base and vertebrae: No acute fracture. No primary bone lesion or focal pathologic process. Soft tissues and spinal canal: No prevertebral fluid or swelling. No visible canal hematoma. Disc levels: Moderate disc height loss and uncovertebral hypertrophy at C3-C4, C4-C5, and C6-C7. Mild disc height loss at C5-C6 and C7-T1. Moderate bilateral facet arthropathy at C2-C3 and C7-T1. Upper chest: Negative. Other: None. IMPRESSION: 1.  No acute intracranial abnormality. 2.  No acute cervical spine fracture. Electronically Signed   By: Titus Dubin M.D.   On: 01/02/2018 19:34   Ct Abdomen Pelvis W Contrast  Addendum Date: 01/02/2018   ADDENDUM REPORT: 01/02/2018 20:05 ADDENDUM: Critical Value/emergent results were called by telephone at the time of interpretation on 01/02/2018 at 8:03 pm to Dr. Francine Graven , who verbally acknowledged these results. Electronically Signed   By: Claudie Revering M.D.   On: 01/02/2018 20:05   Result Date:  01/02/2018 CLINICAL DATA:  Sharp pains in the chest when taking a deep breath after falling approximately 9 ft from a ladder 2 days ago. Nausea since this morning. EXAM: CT CHEST, ABDOMEN, AND PELVIS WITH CONTRAST TECHNIQUE: Multidetector CT imaging of the chest, abdomen and pelvis was performed following the standard protocol during bolus administration of intravenous contrast. CONTRAST:  187m ISOVUE-300 IOPAMIDOL (ISOVUE-300) INJECTION 61% COMPARISON:  None. FINDINGS: CT CHEST FINDINGS Cardiovascular: Mild atheromatous calcifications, including the aorta and minimal coronary artery calcification. Normal sized heart. Mediastinum/Nodes: No enlarged mediastinal, hilar, or axillary lymph nodes. Thyroid gland, trachea, and esophagus demonstrate no significant findings. No mediastinal hemorrhage. Lungs/Pleura: Mild patchy density in the posterior aspect of the right lower lobe. Otherwise, minimal patchy opacity in the posterior aspect of the right middle lobe. The remainder of the lungs are clear. No pleural fluid or pneumothorax. Musculoskeletal: Essentially nondisplaced fractures of the anterolateral aspect of the right 7th rib and lateral aspects of the right 8th, 9th and 10th ribs. Old, healed left lateral 10th rib fracture. Thoracic spine degenerative changes. CT ABDOMEN PELVIS FINDINGS Hepatobiliary: Mild diffuse low density of the liver relative to the spleen. Normal appearing gallbladder. Pancreas: Unremarkable. No pancreatic ductal dilatation or surrounding inflammatory changes. Spleen: 5 mm oval area of low density in the lateral aspect of the inferior portion of the spleen on coronal image number 119. 5 mm  similar area more inferiorly on coronal image number 120. There is a slightly smaller and more faint similar area on axial image number 62 of series 2. There is a small slightly more linear area of low density in the posterolateral spleen inferiorly on axial image number 63 series 2. The remainder of the  spleen has a normal appearance. Adrenals/Urinary Tract: Normal appearing adrenal glands. Small bilateral renal cysts. Normal appearing ureters and urinary bladder. Stomach/Bowel: Stomach is within normal limits. Appendix appears normal. No evidence of bowel wall thickening, distention, or inflammatory changes. Vascular/Lymphatic: Atheromatous arterial calcifications without aneurysm. No enlarged lymph nodes. Reproductive: Prostate is unremarkable. Other: Small umbilical hernia containing fat. Musculoskeletal: Lumbar spine degenerative changes. These include facet degenerative changes with associated grade 1 anterolisthesis at the L5-S1 level. The vertebral bodies may be fused at that level. IMPRESSION: 1. Four small areas of low density in the subcapsular region of the inferior aspect of the spleen laterally, as described above. These may represent tiny peripheral lacerations associated with the patient's recent fall or associated with a previous injury. 2. Essentially nondisplaced fractures of the right 7th through 10th ribs. 3. Minimal patchy opacity in the right lower lobe and posterior aspect of the right middle lobe, as described above. These may represent areas of mild pulmonary contusion. Pneumonia or patchy atelectasis could also have this appearance. 4. Mild diffuse hepatic steatosis. 5. Minimal calcific coronary artery atherosclerosis and mild calcific aortic atherosclerosis. Aortic Atherosclerosis (ICD10-I70.0). Electronically Signed: By: Claudie Revering M.D. On: 01/02/2018 19:46   Dg Chest Port 1 View  Result Date: 01/02/2018 CLINICAL DATA:  Fall 2 days ago EXAM: PORTABLE CHEST 1 VIEW COMPARISON:  None. FINDINGS: The heart size and mediastinal contours are within normal limits. Both lungs are clear. The visualized skeletal structures are unremarkable. IMPRESSION: No active disease. Electronically Signed   By: Franchot Gallo M.D.   On: 01/02/2018 15:14      Kalman Drape , [facility]  Surgery 01/03/2018, 9:10 AM  Pager: [phone] Mon-Wed, Friday 7:00am-4:30pm Thurs 7am-11:30am  Consults: [phone]

## 2018-01-03 NOTE — Plan of Care (Signed)
  Problem: Education: Goal: Knowledge of General Education information will improve Description Including pain rating scale, medication(s)/side effects and non-pharmacologic comfort measures Outcome: Progressing   Problem: Clinical Measurements: Goal: Will remain free from infection Outcome: Progressing Goal: Cardiovascular complication will be avoided Outcome: Progressing   Problem: Activity: Goal: Risk for activity intolerance will decrease Outcome: Progressing

## 2018-01-03 NOTE — Evaluation (Signed)
Physical Therapy Evaluation Patient Details Name: David Mooney MRN: [id number] DOB: 09-Jul-1940 Today's Date: 01/03/2018   History of Present Illness  David Mooney was up on a ladder with a blower blowing leaves out of his gutters 2 days ago.  The ladder began to slide and he rode the ladder down to the ground hitting his head and chest.  He had some lower chest wall pain.  No loss of consciousness.  He dealt with the pain at home for 2 days but developed N&V today so he went to the [facility] emergency department.  Work-up there revealed multiple right-sided rib fractures and a splenic injury.  Pt  transferred to [facility]  Clinical Impression  Pt admitted with above diagnosis. Pt currently with functional limitations due to the deficits listed below (see PT Problem List). Pt was assessed for BPPV and it appeared positive for right horizontal canal BPPV.  During testing, question whether it might have been left horizontal canal but pt nauseous and did not want to do any more treatment at this point. PT  Will return tomorrow and reassess and treat as needed.  Pt will benefit from skilled PT to increase their independence and safety with mobility to allow discharge to the venue listed below.      Follow Up Recommendations Home health PT;Supervision/Assistance - 24 hour    Equipment Recommendations  Rolling walker with 5" wheels    Recommendations for Other Services       Precautions / Restrictions Precautions Precautions: Fall Restrictions Weight Bearing Restrictions: No      Mobility  Bed Mobility Overal bed mobility: Needs Assistance Bed Mobility: Supine to Sit     Supine to sit: Supervision        Transfers Overall transfer level: Needs assistance Equipment used: Rolling walker (2 wheeled) Transfers: Sit to/from Stand Sit to Stand: Min assist         General transfer comment: Min A to stand pivot.   Ambulation/Gait                Stairs             Wheelchair Mobility    Modified Rankin (Stroke Patients Only)       Balance Overall balance assessment: Needs assistance Sitting-balance support: Feet supported;No upper extremity supported Sitting balance-Leahy Scale: Good                                       Pertinent Vitals/Pain Pain Assessment: No/denies pain    Home Living Family/patient expects to be discharged to:: Private residence Living Arrangements: Spouse/significant other Available Help at Discharge: Family;Available 24 hours/day Type of Home: House Home Access: Stairs to enter Entrance Stairs-Rails: None Entrance Stairs-Number of Steps: 2 Home Layout: One level Home Equipment: Environmental consultant - 2 wheels      Prior Function Level of Independence: Independent               Hand Dominance   Dominant Hand: Right    Extremity/Trunk Assessment   Upper Extremity Assessment Upper Extremity Assessment: Defer to OT evaluation    Lower Extremity Assessment Lower Extremity Assessment: Generalized weakness    Cervical / Trunk Assessment Cervical / Trunk Assessment: Normal  Communication   Communication: No difficulties  Cognition Arousal/Alertness: Awake/alert Behavior During Therapy: WFL for tasks assessed/performed Overall Cognitive Status: Impaired/Different from baseline Area of Impairment: Problem solving;Safety/judgement  Safety/Judgement: Decreased awareness of safety   Problem Solving: Slow processing;Decreased initiation;Difficulty sequencing;Requires verbal cues General Comments: Pt appears to processing information slowly and responding slowly at times. When asked he reports that this is true. We talked briefly about post concussive symptoms      General Comments General comments (skin integrity, edema, etc.): Vestibular assessment performed and initially felt that pt with right horizontal canal BPPV however after treatement questioned  whether pt with left horizontal canal BPPV but did not want to treat again immediately due to pt nauseated.  Will check back tomorrow.  Pt dry heaving for a bit but better before PT left.     Exercises     Assessment/Plan    PT Assessment Patient needs continued PT services  PT Problem List Decreased activity tolerance;Decreased balance;Decreased mobility;Pain;Decreased safety awareness;Decreased knowledge of precautions;Decreased knowledge of use of DME(BPPV)       PT Treatment Interventions DME instruction;Gait training;Functional mobility training;Stair training;Therapeutic activities;Therapeutic exercise;Balance training;Patient/family education(canalith repositioning)    PT Goals (Current goals can be found in the Care Plan section)  Acute Rehab PT Goals Patient Stated Goal: hopeful for home tomorrow PT Goal Formulation: With patient Time For Goal Achievement: 01/17/18 Potential to Achieve Goals: Good    Frequency Min 3X/week   Barriers to discharge        Co-evaluation               AM-PAC PT "6 Clicks" Mobility  Outcome Measure Help needed turning from your back to your side while in a flat bed without using bedrails?: None Help needed moving from lying on your back to sitting on the side of a flat bed without using bedrails?: None Help needed moving to and from a bed to a chair (including a wheelchair)?: A Little Help needed standing up from a chair using your arms (e.g., wheelchair or bedside chair)?: A Little Help needed to walk in hospital room?: A Little Help needed climbing 3-5 steps with a railing? : A Little 6 Click Score: 20    End of Session Equipment Utilized During Treatment: Gait belt Activity Tolerance: Patient limited by fatigue(limited by nausea) Patient left: in bed;with call bell/phone within reach;with family/visitor present Nurse Communication: Mobility status(nausea meds) PT Visit Diagnosis: Unsteadiness on feet (R26.81);Muscle weakness  (generalized) (M62.81);Pain;BPPV BPPV - Right/Left : Right Pain - part of body: (ribs)    Time: 3710-6269 PT Time Calculation (min) (ACUTE ONLY): 28 min   Charges:   PT Evaluation $PT Eval Moderate Complexity: 1 Mod PT Treatments $Canalith Rep Proc: 8-22 mins        Gennesis Hogland,PT Acute Rehabilitation Services Pager:  [phone]  Office:  [phone]    Denice Paradise 01/03/2018, 5:59 PM

## 2018-01-04 DIAGNOSIS — S2241XA Multiple fractures of ribs, right side, initial encounter for closed fracture: Secondary | ICD-10-CM | POA: Diagnosis not present

## 2018-01-04 DIAGNOSIS — S3600XA Unspecified injury of spleen, initial encounter: Secondary | ICD-10-CM | POA: Diagnosis not present

## 2018-01-04 LAB — CBC
HCT: 35.8 % — ABNORMAL LOW (ref 39.0–52.0)
Hemoglobin: 11.9 g/dL — ABNORMAL LOW (ref 13.0–17.0)
MCH: 29.5 pg (ref 26.0–34.0)
MCHC: 33.2 g/dL (ref 30.0–36.0)
MCV: 88.8 fL (ref 80.0–100.0)
Platelets: 160 10*3/uL (ref 150–400)
RBC: 4.03 MIL/uL — ABNORMAL LOW (ref 4.22–5.81)
RDW: 12.9 % (ref 11.5–15.5)
WBC: 7.2 10*3/uL (ref 4.0–10.5)
nRBC: 0 % (ref 0.0–0.2)

## 2018-01-04 MED ORDER — ACETAMINOPHEN 325 MG PO TABS: 650.0000 mg | ORAL_TABLET | Freq: Four times a day (QID) | ORAL | Status: DC

## 2018-01-04 MED ORDER — ONDANSETRON 4 MG PO TBDP: 4.0000 mg | ORAL_TABLET | Freq: Four times a day (QID) | ORAL | 0 refills | Status: DC | PRN

## 2018-01-04 MED ORDER — TRAMADOL HCL 50 MG PO TABS: 50.0000 mg | ORAL_TABLET | Freq: Four times a day (QID) | ORAL | 0 refills | Status: DC | PRN

## 2018-01-04 MED ORDER — METHOCARBAMOL 500 MG PO TABS: 500.0000 mg | ORAL_TABLET | Freq: Three times a day (TID) | ORAL | 0 refills | Status: DC | PRN

## 2018-01-04 NOTE — Progress Notes (Signed)
Physical Therapy Treatment Patient Details Name: David Mooney MRN: [id number] DOB: 09/22/1940 Today's Date: 01/04/2018    History of Present Illness David Mooney was up on a ladder with a blower blowing leaves out of his gutters 2 days ago.  The ladder began to slide and he rode the ladder down to the ground hitting his head and chest.  He had some lower chest wall pain.  No loss of consciousness.  He dealt with the pain at home for 2 days but developed N&V today so he went to the [facility] emergency department.  Work-up there revealed multiple right-sided rib fractures and a splenic injury.  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  He continues to fee a little "off" and RW was delivered for home while I was treating him.  He plans to d/c home today with home therapy follow up.  PT will continue to follow acutely for safe mobility progression   Follow Up Recommendations  Home health PT;Supervision/Assistance - 24 hour(please request a vestibular therapist)     Equipment Recommendations  Rolling walker with 5" wheels    Recommendations for Other Services   NA     Precautions / Restrictions Precautions Precautions: Fall Precaution Comments: mildly unsteady on his feet    Mobility  Bed Mobility Overal bed mobility: Modified Independent                Transfers Overall transfer level: Modified independent Equipment used: Rolling walker (2 wheeled) Transfers: Sit to/from Stand Sit to Stand: Modified independent (Device/Increase time)         General transfer comment: Pt able to stand safely and easily from bed and recliner to RW, safe hand placement demonstrated  Ambulation/Gait Ambulation/Gait assistance: Supervision Gait Distance (Feet): 5 Feet Assistive device: Rolling walker (2 wheeled) Gait Pattern/deviations: Step-to pattern      General Gait Details: Side stepped from chair to bed and back to chair.  Steady with RW use          Balance Overall balance assessment: Needs assistance Sitting-balance support: Feet supported;No upper extremity supported Sitting balance-Leahy Scale: Good     Standing balance support: Bilateral upper extremity supported Standing balance-Leahy Scale: Fair             01/04/18 1433  Vestibular Assessment  General Observation fell from ladder  Symptom Behavior  Type of Dizziness Spinning  Frequency of Dizziness with movement, particularly rolling  Duration of Dizziness medium  Aggravating Factors Rolling to right  Relieving Factors Head stationary;Closing eyes  Occulomotor Exam  Occulomotor Alignment Abnormal (left eye abducted, no reports of h/o strabisums, (+) catarac)  Spontaneous Absent  Gaze-induced Absent  Smooth Pursuits Intact  Saccades Intact  Comment pt can converge, but at 3-4" doubling (normal is 2")  Vestibulo-Occular Reflex  VOR 1 Head Only (x 1 viewing) pt with difficulty understanding what I want him to do, also guarded with HIT  Comment test of skew negative  Positional Testing  Horizontal Canal Testing Horizontal Canal Right;Horizontal Canal Left  Horizontal Canal Right  Horizontal Canal Right Duration 45-60 (more symptomatic and more brisk nystagmus to R)  Horizontal Canal Right Symptoms Ageotrophic  Horizontal Canal Left  Horizontal Canal Left Duration 35-40  Horizontal Canal Left Symptoms Ageotrophic   01/04/2018 Treated with gufoni x 1 for R ear horizontal canal cupulolithiasis.  Had negative horizontal canal testing after, so did not repeat canalith repositioning maneuver.                      Cognition Arousal/Alertness: Awake/alert Behavior During Therapy: WFL for tasks assessed/performed Overall Cognitive Status: Within Functional Limits for tasks assessed                                 General Comments: No  significant cognitive deficits noted             Pertinent Vitals/Pain Pain Assessment: Faces Faces Pain Scale: Hurts little more Pain Location: chest, HA stopped after he ate earlier Pain Descriptors / Indicators: Sore Pain Intervention(s): Limited activity within patient's tolerance;Monitored during session;Repositioned           PT Goals (current goals can now be found in the care plan section) Acute Rehab PT Goals Patient Stated Goal: to d/c home today, feel better (decrease HA and dizziness/nausea) Progress towards PT goals: Progressing toward goals    Frequency    Min 3X/week      PT Plan Current plan remains appropriate       AM-PAC PT "6 Clicks" Mobility   Outcome Measure  Help needed turning from your back to your side while in a flat bed without using bedrails?: None Help needed moving from lying on your back to sitting on the side of a flat bed without using bedrails?: None Help needed moving to and from a bed to a chair (including a wheelchair)?: None Help needed standing up from a chair using your arms (e.g., wheelchair or bedside chair)?: None Help needed to walk in hospital room?: None Help needed climbing 3-5 steps with a railing? : A Little 6 Click Score: 23    End of Session   Activity Tolerance: Patient tolerated treatment well Patient left: in chair;with call bell/phone within reach Nurse Communication: Mobility status PT Visit Diagnosis: Unsteadiness on feet (R26.81);Muscle weakness (generalized) (M62.81);Pain;BPPV BPPV - Right/Left : Right Pain - Right/Left: (middle chest and head) Pain - part of body: (chest and head)     Time: [id number] PT Time Calculation (min) (ACUTE ONLY): 28 min  Charges:  $Therapeutic Activity: 8-22 mins $Canalith Rep Proc: 8-22 mins                    Malicia Blasdel B. Jossalyn Forgione, PT, DPT  Acute Rehabilitation [phone] pager #[phone] office   01/04/2018, 2:36 PM

## 2018-01-04 NOTE — Discharge Summary (Signed)
[facility] Discharge Summary   Patient ID: David Mooney MRN: [id number] DOB/AGE: [birth year] 77 y.o.  Admit date: 01/02/2018 Discharge date: 01/04/2018  Admitting Diagnosis: Fall on ladder 2dPTA Right rib fractures 7-10 Multiple small splenic contusions consistent with grade 1 injury Forehead and bilateral shin abrasions N/V   Discharge Diagnosis Patient Active Problem List   Diagnosis Date Noted  . Fall 01/02/2018  . Multiple closed fractures of ribs of right side 01/02/2018  . GERD (gastroesophageal reflux disease) 09/15/2013  . Flatulence 09/15/2013  . Ulcerative colitis ([place]) 07/24/2011  . Glaucoma 07/24/2011    Consultants None  Imaging: Ct Head Wo Contrast  Result Date: 01/02/2018 CLINICAL DATA:  [place] [address] 9 feet off a ladder on Tuesday, hitting his head. EXAM: CT HEAD WITHOUT CONTRAST CT CERVICAL SPINE WITHOUT CONTRAST TECHNIQUE: Multidetector CT imaging of the head and cervical spine was performed following the standard protocol without intravenous contrast. Multiplanar CT image reconstructions of the cervical spine were also generated. COMPARISON:  None. FINDINGS: CT HEAD FINDINGS Brain: No evidence of acute infarction, hemorrhage, hydrocephalus, extra-axial collection or mass lesion/mass effect. Mild age related cerebral atrophy. Vascular: Calcified atherosclerosis at the skullbase. No hyperdense vessel. Dolichoectasia of the basilar artery. Skull: Normal. Negative for fracture or focal lesion. Sinuses/Orbits: No acute finding. Other: None. CT CERVICAL SPINE FINDINGS Alignment: Facet mediated trace anterolisthesis at C7-T1. No traumatic malalignment. Skull base and vertebrae: No acute fracture. No primary bone lesion or focal pathologic process. Soft tissues and spinal canal: No prevertebral fluid or swelling. No visible canal hematoma. Disc levels: Moderate disc height loss and uncovertebral hypertrophy at C3-C4, C4-C5, and C6-C7. Mild disc height loss at  C5-C6 and C7-T1. Moderate bilateral facet arthropathy at C2-C3 and C7-T1. Upper chest: Negative. Other: None. IMPRESSION: 1.  No acute intracranial abnormality. 2.  No acute cervical spine fracture. Electronically Signed   By: Titus Dubin M.D.   On: 01/02/2018 19:34   Ct Chest W Contrast  Addendum Date: 01/02/2018   ADDENDUM REPORT: 01/02/2018 20:05 ADDENDUM: Critical Value/emergent results were called by telephone at the time of interpretation on 01/02/2018 at 8:03 pm to Dr. Francine Graven , who verbally acknowledged these results. Electronically Signed   By: Claudie Revering M.D.   On: 01/02/2018 20:05   Result Date: 01/02/2018 CLINICAL DATA:  Sharp pains in the chest when taking a deep breath after falling approximately 9 ft from a ladder 2 days ago. Nausea since this morning. EXAM: CT CHEST, ABDOMEN, AND PELVIS WITH CONTRAST TECHNIQUE: Multidetector CT imaging of the chest, abdomen and pelvis was performed following the standard protocol during bolus administration of intravenous contrast. CONTRAST:  140m ISOVUE-300 IOPAMIDOL (ISOVUE-300) INJECTION 61% COMPARISON:  None. FINDINGS: CT CHEST FINDINGS Cardiovascular: Mild atheromatous calcifications, including the aorta and minimal coronary artery calcification. Normal sized heart. Mediastinum/Nodes: No enlarged mediastinal, hilar, or axillary lymph nodes. Thyroid gland, trachea, and esophagus demonstrate no significant findings. No mediastinal hemorrhage. Lungs/Pleura: Mild patchy density in the posterior aspect of the right lower lobe. Otherwise, minimal patchy opacity in the posterior aspect of the right middle lobe. The remainder of the lungs are clear. No pleural fluid or pneumothorax. Musculoskeletal: Essentially nondisplaced fractures of the anterolateral aspect of the right 7th rib and lateral aspects of the right 8th, 9th and 10th ribs. Old, healed left lateral 10th rib fracture. Thoracic spine degenerative changes. CT ABDOMEN PELVIS FINDINGS  Hepatobiliary: Mild diffuse low density of the liver relative to the spleen. Normal appearing gallbladder. Pancreas: Unremarkable. No pancreatic ductal  dilatation or surrounding inflammatory changes. Spleen: 5 mm oval area of low density in the lateral aspect of the inferior portion of the spleen on coronal image number 119. 5 mm similar area more inferiorly on coronal image number 120. There is a slightly smaller and more faint similar area on axial image number 62 of series 2. There is a small slightly more linear area of low density in the posterolateral spleen inferiorly on axial image number 63 series 2. The remainder of the spleen has a normal appearance. Adrenals/Urinary Tract: Normal appearing adrenal glands. Small bilateral renal cysts. Normal appearing ureters and urinary bladder. Stomach/Bowel: Stomach is within normal limits. Appendix appears normal. No evidence of bowel wall thickening, distention, or inflammatory changes. Vascular/Lymphatic: Atheromatous arterial calcifications without aneurysm. No enlarged lymph nodes. Reproductive: Prostate is unremarkable. Other: Small umbilical hernia containing fat. Musculoskeletal: Lumbar spine degenerative changes. These include facet degenerative changes with associated grade 1 anterolisthesis at the L5-S1 level. The vertebral bodies may be fused at that level. IMPRESSION: 1. Four small areas of low density in the subcapsular region of the inferior aspect of the spleen laterally, as described above. These may represent tiny peripheral lacerations associated with the patient's recent fall or associated with a previous injury. 2. Essentially nondisplaced fractures of the right 7th through 10th ribs. 3. Minimal patchy opacity in the right lower lobe and posterior aspect of the right middle lobe, as described above. These may represent areas of mild pulmonary contusion. Pneumonia or patchy atelectasis could also have this appearance. 4. Mild diffuse hepatic  steatosis. 5. Minimal calcific coronary artery atherosclerosis and mild calcific aortic atherosclerosis. Aortic Atherosclerosis (ICD10-I70.0). Electronically Signed: By: Claudie Revering M.D. On: 01/02/2018 19:46   Ct Cervical Spine Wo Contrast  Result Date: 01/02/2018 CLINICAL DATA:  [place] [address] 9 feet off a ladder on Tuesday, hitting his head. EXAM: CT HEAD WITHOUT CONTRAST CT CERVICAL SPINE WITHOUT CONTRAST TECHNIQUE: Multidetector CT imaging of the head and cervical spine was performed following the standard protocol without intravenous contrast. Multiplanar CT image reconstructions of the cervical spine were also generated. COMPARISON:  None. FINDINGS: CT HEAD FINDINGS Brain: No evidence of acute infarction, hemorrhage, hydrocephalus, extra-axial collection or mass lesion/mass effect. Mild age related cerebral atrophy. Vascular: Calcified atherosclerosis at the skullbase. No hyperdense vessel. Dolichoectasia of the basilar artery. Skull: Normal. Negative for fracture or focal lesion. Sinuses/Orbits: No acute finding. Other: None. CT CERVICAL SPINE FINDINGS Alignment: Facet mediated trace anterolisthesis at C7-T1. No traumatic malalignment. Skull base and vertebrae: No acute fracture. No primary bone lesion or focal pathologic process. Soft tissues and spinal canal: No prevertebral fluid or swelling. No visible canal hematoma. Disc levels: Moderate disc height loss and uncovertebral hypertrophy at C3-C4, C4-C5, and C6-C7. Mild disc height loss at C5-C6 and C7-T1. Moderate bilateral facet arthropathy at C2-C3 and C7-T1. Upper chest: Negative. Other: None. IMPRESSION: 1.  No acute intracranial abnormality. 2.  No acute cervical spine fracture. Electronically Signed   By: Titus Dubin M.D.   On: 01/02/2018 19:34   Ct Abdomen Pelvis W Contrast  Addendum Date: 01/02/2018   ADDENDUM REPORT: 01/02/2018 20:05 ADDENDUM: Critical Value/emergent results were called by telephone at the time of interpretation on 01/02/2018  at 8:03 pm to Dr. Francine Graven , who verbally acknowledged these results. Electronically Signed   By: Claudie Revering M.D.   On: 01/02/2018 20:05   Result Date: 01/02/2018 CLINICAL DATA:  Sharp pains in the chest when taking a deep breath after falling approximately 9 ft  from a ladder 2 days ago. Nausea since this morning. EXAM: CT CHEST, ABDOMEN, AND PELVIS WITH CONTRAST TECHNIQUE: Multidetector CT imaging of the chest, abdomen and pelvis was performed following the standard protocol during bolus administration of intravenous contrast. CONTRAST:  129m ISOVUE-300 IOPAMIDOL (ISOVUE-300) INJECTION 61% COMPARISON:  None. FINDINGS: CT CHEST FINDINGS Cardiovascular: Mild atheromatous calcifications, including the aorta and minimal coronary artery calcification. Normal sized heart. Mediastinum/Nodes: No enlarged mediastinal, hilar, or axillary lymph nodes. Thyroid gland, trachea, and esophagus demonstrate no significant findings. No mediastinal hemorrhage. Lungs/Pleura: Mild patchy density in the posterior aspect of the right lower lobe. Otherwise, minimal patchy opacity in the posterior aspect of the right middle lobe. The remainder of the lungs are clear. No pleural fluid or pneumothorax. Musculoskeletal: Essentially nondisplaced fractures of the anterolateral aspect of the right 7th rib and lateral aspects of the right 8th, 9th and 10th ribs. Old, healed left lateral 10th rib fracture. Thoracic spine degenerative changes. CT ABDOMEN PELVIS FINDINGS Hepatobiliary: Mild diffuse low density of the liver relative to the spleen. Normal appearing gallbladder. Pancreas: Unremarkable. No pancreatic ductal dilatation or surrounding inflammatory changes. Spleen: 5 mm oval area of low density in the lateral aspect of the inferior portion of the spleen on coronal image number 119. 5 mm similar area more inferiorly on coronal image number 120. There is a slightly smaller and more faint similar area on axial image number 62 of  series 2. There is a small slightly more linear area of low density in the posterolateral spleen inferiorly on axial image number 63 series 2. The remainder of the spleen has a normal appearance. Adrenals/Urinary Tract: Normal appearing adrenal glands. Small bilateral renal cysts. Normal appearing ureters and urinary bladder. Stomach/Bowel: Stomach is within normal limits. Appendix appears normal. No evidence of bowel wall thickening, distention, or inflammatory changes. Vascular/Lymphatic: Atheromatous arterial calcifications without aneurysm. No enlarged lymph nodes. Reproductive: Prostate is unremarkable. Other: Small umbilical hernia containing fat. Musculoskeletal: Lumbar spine degenerative changes. These include facet degenerative changes with associated grade 1 anterolisthesis at the L5-S1 level. The vertebral bodies may be fused at that level. IMPRESSION: 1. Four small areas of low density in the subcapsular region of the inferior aspect of the spleen laterally, as described above. These may represent tiny peripheral lacerations associated with the patient's recent fall or associated with a previous injury. 2. Essentially nondisplaced fractures of the right 7th through 10th ribs. 3. Minimal patchy opacity in the right lower lobe and posterior aspect of the right middle lobe, as described above. These may represent areas of mild pulmonary contusion. Pneumonia or patchy atelectasis could also have this appearance. 4. Mild diffuse hepatic steatosis. 5. Minimal calcific coronary artery atherosclerosis and mild calcific aortic atherosclerosis. Aortic Atherosclerosis (ICD10-I70.0). Electronically Signed: By: SClaudie ReveringM.D. On: 01/02/2018 19:46   Dg Chest Port 1 View  Result Date: 01/02/2018 CLINICAL DATA:  Fall 2 days ago EXAM: PORTABLE CHEST 1 VIEW COMPARISON:  None. FINDINGS: The heart size and mediastinal contours are within normal limits. Both lungs are clear. The visualized skeletal structures are  unremarkable. IMPRESSION: No active disease. Electronically Signed   By: CFranchot GalloM.D.   On: 01/02/2018 15:14    Procedures None  Hospital Course:  LERICH KOCHANis a 89yom[place] [facility] 2 days prior to admission.  The ladder began to slide and he rode the ladder down to the ground hitting his head and chest.  He  had some lower chest wall pain.  No loss of consciousness.  He dealt with the pain at home for 2 days but developed nausea and vomiting so he went to the [facility] emergency department.  Work-up there revealed multiple right-sided rib fractures and a splenic injury.  Transferred to [facility] for admission to the trauma service for pulmonary toilet and follow-up hemoglobin. Hemoglobin remained stable and nausea/vomiting resolved, therefore diet was advanced as tolerated. Patient worked with therapies during this admission who recommended home with home health therapies when medically stable for discharge. On 12/21 the patient was voiding well, tolerating diet, ambulating well, pain well controlled, vital signs stable and felt stable for discharge home.  Patient will follow up as below and knows to call with questions or concerns.     Allergies as of 01/04/2018   No Known Allergies     Medication List    TAKE these medications   acetaminophen 325 MG tablet Commonly known as:  TYLENOL Take 2 tablets (650 mg total) by mouth every 6 (six) hours. What changed:    medication strength  how much to take  when to take this  reasons to take this   ALIGN PO Take 1 tablet by mouth daily.   atorvastatin 10 MG tablet Commonly known as:  LIPITOR Take 10 mg by mouth every morning.   esomeprazole 40 MG capsule Commonly known as:  NEXIUM Take 40 mg by mouth daily before breakfast.   fluticasone 50 MCG/ACT nasal spray Commonly known as:  FLONASE Place 2 sprays into both nostrils every morning.   LUMIGAN 0.01 % Soln Generic  drug:  bimatoprost Place 1 drop into both eyes at bedtime.   mesalamine 1.2 g EC tablet Commonly known as:  LIALDA TAKE 2 TABLETS BY MOUTH TWICE DAILY   methocarbamol 500 MG tablet Commonly known as:  ROBAXIN Take 1 tablet (500 mg total) by mouth every 8 (eight) hours as needed for muscle spasms.   Multiple Vitamins tablet Take 1 tablet by mouth daily.   Naproxen Sodium 220 MG Caps Take 220 mg by mouth daily as needed (for pain).   ondansetron 4 MG disintegrating tablet Commonly known as:  ZOFRAN-ODT Take 1 tablet (4 mg total) by mouth every 6 (six) hours as needed for nausea.   solifenacin 10 MG tablet Commonly known as:  VESICARE Take 10 mg by mouth every morning.   tamsulosin 0.4 MG Caps capsule Commonly known as:  FLOMAX Take 0.4 mg by mouth every morning.   traMADol 50 MG tablet Commonly known as:  ULTRAM Take 1 tablet (50 mg total) by mouth every 6 (six) hours as needed for severe pain.   TYLENOL COLD MULTI-SYMPTOM PO Take by mouth. Patient uses on as needed basis , when it is used it is twice a day.   Vitamin D 50 MCG (2000 UT) tablet Take 2,000 Units by mouth daily.            Durable Medical Equipment  (From admission, onward)         Start     Ordered   01/04/18 1033  For home use only DME Walker rolling  Once    Question:  Patient needs a walker to treat with the following condition  Answer:  Multiple fractures of ribs, right side, initial encounter for closed fracture   01/04/18 1035           Follow-up Information    [facility]na. Call.   Why:  as needed, you do not have to schedule an appointment Contact information: [place] [address] [phone]       Curlene Labrum, MD. Schedule an appointment as soon as possible for a visit in 2 week(s).   Specialty:  Family Medicine Why:  call to arrange post-hospitalization follow up appointment Contact information: [place] [address] [phone]           Signed: [place], [facility] Surgery 01/04/2018, 10:46 AM Pager: [phone] Mon 7:00 am -11:30 AM Tues-Fri 7:00 am-4:30 pm Sat-Sun 7:00 am-11:30 am

## 2018-01-04 NOTE — Plan of Care (Signed)
  Problem: Education: Goal: Knowledge of General Education information will improve Description: Including pain rating scale, medication(s)/side effects and non-pharmacologic comfort measures Outcome: Progressing   Problem: Health Behavior/Discharge Planning: Goal: Ability to manage health-related needs will improve Outcome: Progressing   Problem: Activity: Goal: Risk for activity intolerance will decrease Outcome: Progressing   

## 2018-01-04 NOTE — Progress Notes (Addendum)
[facility] Progress Note     Subjective: CC-  Patient sitting up in chair, wife at bedside. Overall doing well. Denies any abdominal pain, nausea, vomiting. Tolerating diet. Pain from rib fractures well controlled. Denies SOB. Pulling 2000 on IS.  States that he has been getting in/out of bed independently to go to the bathroom.  Objective: Vital signs in last 24 hours: Temp:  [97.5 F (36.4 C)-98.5 F (36.9 C)] 98 F (36.7 C) (12/21 0548) Pulse Rate:  [55-71] 71 (12/21 0548) Resp:  [16-20] 16 (12/21 0548) BP: (134-147)/(69-73) 134/71 (12/21 0548) SpO2:  [98 %-100 %] 98 % (12/21 0548) Last BM Date: 01/03/18  Intake/Output from previous day: 12/20 0701 - 12/21 0700 In: 1539.7 [P.O.:560; I.V.:979.7] Out: -  Intake/Output this shift: No intake/output data recorded.  PE: Gen:  Alert, NAD, pleasant HEENT: EOM's intact, pupils equal and round Card:  RRR Pulm:  CTAB, no W/R/R, effort normal, pulling 2000 on IS Abd: Soft, protuberant, NT, +BS, no HSM Ext:  Calves soft and nontender Psych: A&Ox3  Skin: no rashes noted, warm and dry  Lab Results:  Recent Labs    01/03/18 0144 01/04/18 0423  WBC 12.3* 7.2  HGB 11.6* 11.9*  HCT 34.7* 35.8*  PLT 144* 160   BMET Recent Labs    01/02/18 1454  NA 137  K 4.5  CL 107  CO2 22  GLUCOSE 148*  BUN 11  CREATININE 0.86  CALCIUM 9.6   PT/INR Recent Labs    01/02/18 1454  LABPROT 14.4  INR 1.13   CMP     Component Value Date/Time   NA 137 01/02/2018 1454   K 4.5 01/02/2018 1454   CL 107 01/02/2018 1454   CO2 22 01/02/2018 1454   GLUCOSE 148 (H) 01/02/2018 1454   BUN 11 01/02/2018 1454   CREATININE 0.86 01/02/2018 1454   CALCIUM 9.6 01/02/2018 1454   PROT 7.7 01/02/2018 1454   ALBUMIN 4.4 01/02/2018 1454   AST 27 01/02/2018 1454   ALT 31 01/02/2018 1454   ALKPHOS 39 01/02/2018 1454   BILITOT 1.1 01/02/2018 1454   GFRNONAA >60 01/02/2018 1454   GFRAA >60 01/02/2018 1454   Lipase      Component Value Date/Time   LIPASE 24 01/02/2018 1454       Studies/Results: Ct Head Wo Contrast  Result Date: 01/02/2018 CLINICAL DATA:  [place] 9 feet off a ladder on Tuesday, hitting his head. EXAM: CT HEAD WITHOUT CONTRAST CT CERVICAL SPINE WITHOUT CONTRAST TECHNIQUE: Multidetector CT imaging of the head and cervical spine was performed following the standard protocol without intravenous contrast. Multiplanar CT image reconstructions of the cervical spine were also generated. COMPARISON:  None. FINDINGS: CT HEAD FINDINGS Brain: No evidence of acute infarction, hemorrhage, hydrocephalus, extra-axial collection or mass lesion/mass effect. Mild age related cerebral atrophy. Vascular: Calcified atherosclerosis at the skullbase. No hyperdense vessel. Dolichoectasia of the basilar artery. Skull: Normal. Negative for fracture or focal lesion. Sinuses/Orbits: No acute finding. Other: None. CT CERVICAL SPINE FINDINGS Alignment: Facet mediated trace anterolisthesis at C7-T1. No traumatic malalignment. Skull base and vertebrae: No acute fracture. No primary bone lesion or focal pathologic process. Soft tissues and spinal canal: No prevertebral fluid or swelling. No visible canal hematoma. Disc levels: Moderate disc height loss and uncovertebral hypertrophy at C3-C4, C4-C5, and C6-C7. Mild disc height loss at C5-C6 and C7-T1. Moderate bilateral facet arthropathy at C2-C3 and C7-T1. Upper chest: Negative. Other: None. IMPRESSION: 1.  No acute intracranial abnormality. 2.  No acute cervical spine fracture. Electronically Signed   By: Titus Dubin M.D.   On: 01/02/2018 19:34   Ct Chest W Contrast  Addendum Date: 01/02/2018   ADDENDUM REPORT: 01/02/2018 20:05 ADDENDUM: Critical Value/emergent results were called by telephone at the time of interpretation on 01/02/2018 at 8:03 pm to Dr. Francine Graven , who verbally acknowledged these results. Electronically Signed   By: Claudie Revering M.D.   On: 01/02/2018  20:05   Result Date: 01/02/2018 CLINICAL DATA:  Sharp pains in the chest when taking a deep breath after falling approximately 9 ft from a ladder 2 days ago. Nausea since this morning. EXAM: CT CHEST, ABDOMEN, AND PELVIS WITH CONTRAST TECHNIQUE: Multidetector CT imaging of the chest, abdomen and pelvis was performed following the standard protocol during bolus administration of intravenous contrast. CONTRAST:  192m ISOVUE-300 IOPAMIDOL (ISOVUE-300) INJECTION 61% COMPARISON:  None. FINDINGS: CT CHEST FINDINGS Cardiovascular: Mild atheromatous calcifications, including the aorta and minimal coronary artery calcification. Normal sized heart. Mediastinum/Nodes: No enlarged mediastinal, hilar, or axillary lymph nodes. Thyroid gland, trachea, and esophagus demonstrate no significant findings. No mediastinal hemorrhage. Lungs/Pleura: Mild patchy density in the posterior aspect of the right lower lobe. Otherwise, minimal patchy opacity in the posterior aspect of the right middle lobe. The remainder of the lungs are clear. No pleural fluid or pneumothorax. Musculoskeletal: Essentially nondisplaced fractures of the anterolateral aspect of the right 7th rib and lateral aspects of the right 8th, 9th and 10th ribs. Old, healed left lateral 10th rib fracture. Thoracic spine degenerative changes. CT ABDOMEN PELVIS FINDINGS Hepatobiliary: Mild diffuse low density of the liver relative to the spleen. Normal appearing gallbladder. Pancreas: Unremarkable. No pancreatic ductal dilatation or surrounding inflammatory changes. Spleen: 5 mm oval area of low density in the lateral aspect of the inferior portion of the spleen on coronal image number 119. 5 mm similar area more inferiorly on coronal image number 120. There is a slightly smaller and more faint similar area on axial image number 62 of series 2. There is a small slightly more linear area of low density in the posterolateral spleen inferiorly on axial image number 63 series  2. The remainder of the spleen has a normal appearance. Adrenals/Urinary Tract: Normal appearing adrenal glands. Small bilateral renal cysts. Normal appearing ureters and urinary bladder. Stomach/Bowel: Stomach is within normal limits. Appendix appears normal. No evidence of bowel wall thickening, distention, or inflammatory changes. Vascular/Lymphatic: Atheromatous arterial calcifications without aneurysm. No enlarged lymph nodes. Reproductive: Prostate is unremarkable. Other: Small umbilical hernia containing fat. Musculoskeletal: Lumbar spine degenerative changes. These include facet degenerative changes with associated grade 1 anterolisthesis at the L5-S1 level. The vertebral bodies may be fused at that level. IMPRESSION: 1. Four small areas of low density in the subcapsular region of the inferior aspect of the spleen laterally, as described above. These may represent tiny peripheral lacerations associated with the patient's recent fall or associated with a previous injury. 2. Essentially nondisplaced fractures of the right 7th through 10th ribs. 3. Minimal patchy opacity in the right lower lobe and posterior aspect of the right middle lobe, as described above. These may represent areas of mild pulmonary contusion. Pneumonia or patchy atelectasis could also have this appearance. 4. Mild diffuse hepatic steatosis. 5. Minimal calcific coronary artery atherosclerosis and mild calcific aortic atherosclerosis. Aortic Atherosclerosis (ICD10-I70.0). Electronically Signed: By: SClaudie ReveringM.D. On: 01/02/2018 19:46   Ct Cervical Spine Wo Contrast  Result Date: 01/02/2018 CLINICAL DATA:  [place] off a  ladder on Tuesday, hitting his head. EXAM: CT HEAD WITHOUT CONTRAST CT CERVICAL SPINE WITHOUT CONTRAST TECHNIQUE: Multidetector CT imaging of the head and cervical spine was performed following the standard protocol without intravenous contrast. Multiplanar CT image reconstructions of the cervical spine were also  generated. COMPARISON:  None. FINDINGS: CT HEAD FINDINGS Brain: No evidence of acute infarction, hemorrhage, hydrocephalus, extra-axial collection or mass lesion/mass effect. Mild age related cerebral atrophy. Vascular: Calcified atherosclerosis at the skullbase. No hyperdense vessel. Dolichoectasia of the basilar artery. Skull: Normal. Negative for fracture or focal lesion. Sinuses/Orbits: No acute finding. Other: None. CT CERVICAL SPINE FINDINGS Alignment: Facet mediated trace anterolisthesis at C7-T1. No traumatic malalignment. Skull base and vertebrae: No acute fracture. No primary bone lesion or focal pathologic process. Soft tissues and spinal canal: No prevertebral fluid or swelling. No visible canal hematoma. Disc levels: Moderate disc height loss and uncovertebral hypertrophy at C3-C4, C4-C5, and C6-C7. Mild disc height loss at C5-C6 and C7-T1. Moderate bilateral facet arthropathy at C2-C3 and C7-T1. Upper chest: Negative. Other: None. IMPRESSION: 1.  No acute intracranial abnormality. 2.  No acute cervical spine fracture. Electronically Signed   By: Titus Dubin M.D.   On: 01/02/2018 19:34   Ct Abdomen Pelvis W Contrast  Addendum Date: 01/02/2018   ADDENDUM REPORT: 01/02/2018 20:05 ADDENDUM: Critical Value/emergent results were called by telephone at the time of interpretation on 01/02/2018 at 8:03 pm to Dr. Francine Graven , who verbally acknowledged these results. Electronically Signed   By: Claudie Revering M.D.   On: 01/02/2018 20:05   Result Date: 01/02/2018 CLINICAL DATA:  Sharp pains in the chest when taking a deep breath after falling approximately 9 ft from a ladder 2 days ago. Nausea since this morning. EXAM: CT CHEST, ABDOMEN, AND PELVIS WITH CONTRAST TECHNIQUE: Multidetector CT imaging of the chest, abdomen and pelvis was performed following the standard protocol during bolus administration of intravenous contrast. CONTRAST:  17m ISOVUE-300 IOPAMIDOL (ISOVUE-300) INJECTION 61%  COMPARISON:  None. FINDINGS: CT CHEST FINDINGS Cardiovascular: Mild atheromatous calcifications, including the aorta and minimal coronary artery calcification. Normal sized heart. Mediastinum/Nodes: No enlarged mediastinal, hilar, or axillary lymph nodes. Thyroid gland, trachea, and esophagus demonstrate no significant findings. No mediastinal hemorrhage. Lungs/Pleura: Mild patchy density in the posterior aspect of the right lower lobe. Otherwise, minimal patchy opacity in the posterior aspect of the right middle lobe. The remainder of the lungs are clear. No pleural fluid or pneumothorax. Musculoskeletal: Essentially nondisplaced fractures of the anterolateral aspect of the right 7th rib and lateral aspects of the right 8th, 9th and 10th ribs. Old, healed left lateral 10th rib fracture. Thoracic spine degenerative changes. CT ABDOMEN PELVIS FINDINGS Hepatobiliary: Mild diffuse low density of the liver relative to the spleen. Normal appearing gallbladder. Pancreas: Unremarkable. No pancreatic ductal dilatation or surrounding inflammatory changes. Spleen: 5 mm oval area of low density in the lateral aspect of the inferior portion of the spleen on coronal image number 119. 5 mm similar area more inferiorly on coronal image number 120. There is a slightly smaller and more faint similar area on axial image number 62 of series 2. There is a small slightly more linear area of low density in the posterolateral spleen inferiorly on axial image number 63 series 2. The remainder of the spleen has a normal appearance. Adrenals/Urinary Tract: Normal appearing adrenal glands. Small bilateral renal cysts. Normal appearing ureters and urinary bladder. Stomach/Bowel: Stomach is within normal limits. Appendix appears normal. No evidence of bowel wall thickening, distention,  or inflammatory changes. Vascular/Lymphatic: Atheromatous arterial calcifications without aneurysm. No enlarged lymph nodes. Reproductive: Prostate is  unremarkable. Other: Small umbilical hernia containing fat. Musculoskeletal: Lumbar spine degenerative changes. These include facet degenerative changes with associated grade 1 anterolisthesis at the L5-S1 level. The vertebral bodies may be fused at that level. IMPRESSION: 1. Four small areas of low density in the subcapsular region of the inferior aspect of the spleen laterally, as described above. These may represent tiny peripheral lacerations associated with the patient's recent fall or associated with a previous injury. 2. Essentially nondisplaced fractures of the right 7th through 10th ribs. 3. Minimal patchy opacity in the right lower lobe and posterior aspect of the right middle lobe, as described above. These may represent areas of mild pulmonary contusion. Pneumonia or patchy atelectasis could also have this appearance. 4. Mild diffuse hepatic steatosis. 5. Minimal calcific coronary artery atherosclerosis and mild calcific aortic atherosclerosis. Aortic Atherosclerosis (ICD10-I70.0). Electronically Signed: By: Claudie Revering M.D. On: 01/02/2018 19:46   Dg Chest Port 1 View  Result Date: 01/02/2018 CLINICAL DATA:  Fall 2 days ago EXAM: PORTABLE CHEST 1 VIEW COMPARISON:  None. FINDINGS: The heart size and mediastinal contours are within normal limits. Both lungs are clear. The visualized skeletal structures are unremarkable. IMPRESSION: No active disease. Electronically Signed   By: Franchot Gallo M.D.   On: 01/02/2018 15:14    Anti-infectives: Anti-infectives (From admission, onward)   None       Assessment/Plan Hx of UC  Fall on ladder2dPTA Right rib fractures 7-10 - pulm toilet, pain control, IS Multiple small splenic contusions consistent with grade 1 injury - Hgb 11.9 from 11.6, stable Forehead and bilateral shin abrasions   FEN: reg diet VTE: SCD's ID: no abx, WBC WNL, afebrile Foley: none Follow up: PCP  DISPO: Will discharge patient today after PT. Case management  consult for assistance with setting up home health PT.   LOS: 0 days    Wellington Hampshire , [facility] Surgery 01/04/2018, 10:42 AM Pager: [phone] Mon 7:00 am -11:30 AM Tues-Fri 7:00 am-4:30 pm Sat-Sun 7:00 am-11:30 am

## 2018-01-04 NOTE — Discharge Instructions (Signed)
Rib Fracture  A rib fracture is a break or crack in one of the bones of the ribs. The ribs are like a cage that goes around your upper chest. A broken or cracked rib is often painful, but most do not cause other problems. Most rib fractures usually heal on their own in 1-3 months. Follow these instructions at home: Managing pain, stiffness, and swelling  If directed, apply ice to the injured area. ? Put ice in a plastic bag. ? Place a towel between your skin and the bag. ? Leave the ice on for 20 minutes, 2-3 times a day.  Take over-the-counter and prescription medicines only as told by your doctor. Activity  Avoid activities that cause pain to the injured area. Protect your injured area.  Slowly increase activity as told by your doctor. General instructions  Do deep breathing as told by your doctor. You may be told to: ? Take deep breaths many times a day. ? Cough many times a day while hugging a pillow. ? Use a device (incentive spirometer) to do deep breathing many times a day.  Drink enough fluid to keep your pee (urine) clear or pale yellow.  Do not wear a rib belt or binder. These do not allow you to breathe deeply.  Keep all follow-up visits as told by your doctor. This is important. Contact a doctor if:  You have a fever. Get help right away if:  You have trouble breathing.  You are short of breath.  You cannot stop coughing.  You cough up thick or bloody spit (sputum).  You feel sick to your stomach (nauseous), throw up (vomit), or have belly (abdominal) pain.  Your pain gets worse and medicine does not help. Summary  A rib fracture is a break or crack in one of the bones of the ribs.  Apply ice to the injured area and take medicines for pain as told by your doctor.  Take deep breaths and cough many times a day. Hug a pillow every time you cough. This information is not intended to replace advice given to you by your health care provider. Make sure you  discuss any questions you have with your health care provider. Document Released: 10/11/2007 Document Revised: 04/03/2016 Document Reviewed: 04/03/2016 Elsevier Interactive Patient Education  2019 [employer].

## 2018-01-04 NOTE — Care Management Note (Signed)
Case Management Note  Patient Details  Name: David Mooney MRN: [id number] Date of Birth: 12-15-1940  Subjective/Objective:        Pt to d/c home with wife and family support.  Pt agreeable to [facility] PT and RW.              Action/Plan: Pt given Medicare list and chose [facility].  Referral called to [facility] with Nanine Means.  [facility] to deliver RW.  Expected Discharge Date:  01/04/18               Expected Discharge Plan:  [place]  In-House Referral:  NA  Discharge planning Services  CM Consult  Post Acute Care Choice:  Durable Medical Equipment, Home Health Choice offered to:  Patient  DME Arranged:  [facility] Agency:  [place] Arranged:  PT [facility] Agency:  [place]  Status of Service:  Completed, signed off  If discussed at [place] of Stay Meetings, dates discussed:    Additional Comments:  Claudie Leach, RN 01/04/2018, 12:10 PM

## 2018-01-23 DIAGNOSIS — G4733 Obstructive sleep apnea (adult) (pediatric): Secondary | ICD-10-CM | POA: Diagnosis not present

## 2018-02-03 DIAGNOSIS — W11XXXA Fall on and from ladder, initial encounter: Secondary | ICD-10-CM | POA: Diagnosis not present

## 2018-02-03 DIAGNOSIS — S27321A Contusion of lung, unilateral, initial encounter: Secondary | ICD-10-CM | POA: Diagnosis not present

## 2018-02-03 DIAGNOSIS — S2241XD Multiple fractures of ribs, right side, subsequent encounter for fracture with routine healing: Secondary | ICD-10-CM | POA: Diagnosis not present

## 2018-02-03 DIAGNOSIS — S36039A Unspecified laceration of spleen, initial encounter: Secondary | ICD-10-CM | POA: Diagnosis not present

## 2018-02-03 DIAGNOSIS — R42 Dizziness and giddiness: Secondary | ICD-10-CM | POA: Diagnosis not present

## 2018-02-23 DIAGNOSIS — G4733 Obstructive sleep apnea (adult) (pediatric): Secondary | ICD-10-CM | POA: Diagnosis not present

## 2018-02-24 DIAGNOSIS — H9193 Unspecified hearing loss, bilateral: Secondary | ICD-10-CM | POA: Diagnosis not present

## 2018-02-24 DIAGNOSIS — G4733 Obstructive sleep apnea (adult) (pediatric): Secondary | ICD-10-CM | POA: Diagnosis not present

## 2018-03-24 DIAGNOSIS — G4733 Obstructive sleep apnea (adult) (pediatric): Secondary | ICD-10-CM | POA: Diagnosis not present

## 2018-04-14 ENCOUNTER — Other Ambulatory Visit (INDEPENDENT_AMBULATORY_CARE_PROVIDER_SITE_OTHER): Payer: Self-pay | Admitting: *Deleted

## 2018-04-16 NOTE — Telephone Encounter (Signed)
Medication was called to the patient 's pharmacy .

## 2018-04-24 DIAGNOSIS — G4733 Obstructive sleep apnea (adult) (pediatric): Secondary | ICD-10-CM | POA: Diagnosis not present

## 2018-05-20 DIAGNOSIS — R7301 Impaired fasting glucose: Secondary | ICD-10-CM | POA: Diagnosis not present

## 2018-05-20 DIAGNOSIS — K219 Gastro-esophageal reflux disease without esophagitis: Secondary | ICD-10-CM | POA: Diagnosis not present

## 2018-05-20 DIAGNOSIS — N183 Chronic kidney disease, stage 3 (moderate): Secondary | ICD-10-CM | POA: Diagnosis not present

## 2018-05-20 DIAGNOSIS — E782 Mixed hyperlipidemia: Secondary | ICD-10-CM | POA: Diagnosis not present

## 2018-05-24 DIAGNOSIS — G4733 Obstructive sleep apnea (adult) (pediatric): Secondary | ICD-10-CM | POA: Diagnosis not present

## 2018-05-26 DIAGNOSIS — N182 Chronic kidney disease, stage 2 (mild): Secondary | ICD-10-CM | POA: Diagnosis not present

## 2018-05-26 DIAGNOSIS — N3281 Overactive bladder: Secondary | ICD-10-CM | POA: Diagnosis not present

## 2018-05-26 DIAGNOSIS — E782 Mixed hyperlipidemia: Secondary | ICD-10-CM | POA: Diagnosis not present

## 2018-05-26 DIAGNOSIS — Z23 Encounter for immunization: Secondary | ICD-10-CM | POA: Diagnosis not present

## 2018-05-26 DIAGNOSIS — R7301 Impaired fasting glucose: Secondary | ICD-10-CM | POA: Diagnosis not present

## 2018-05-26 DIAGNOSIS — K5180 Other ulcerative colitis without complications: Secondary | ICD-10-CM | POA: Diagnosis not present

## 2018-06-01 ENCOUNTER — Other Ambulatory Visit (INDEPENDENT_AMBULATORY_CARE_PROVIDER_SITE_OTHER): Payer: Self-pay | Admitting: Internal Medicine

## 2018-06-24 DIAGNOSIS — G4733 Obstructive sleep apnea (adult) (pediatric): Secondary | ICD-10-CM | POA: Diagnosis not present

## 2018-07-24 DIAGNOSIS — G4733 Obstructive sleep apnea (adult) (pediatric): Secondary | ICD-10-CM | POA: Diagnosis not present

## 2018-08-12 DIAGNOSIS — H524 Presbyopia: Secondary | ICD-10-CM | POA: Diagnosis not present

## 2018-08-12 DIAGNOSIS — H40053 Ocular hypertension, bilateral: Secondary | ICD-10-CM | POA: Diagnosis not present

## 2018-08-15 ENCOUNTER — Other Ambulatory Visit: Payer: Self-pay

## 2018-08-15 DIAGNOSIS — R6889 Other general symptoms and signs: Secondary | ICD-10-CM | POA: Diagnosis not present

## 2018-08-15 DIAGNOSIS — Z20822 Contact with and (suspected) exposure to covid-19: Secondary | ICD-10-CM

## 2018-08-17 LAB — NOVEL CORONAVIRUS, NAA: SARS-CoV-2, NAA: NOT DETECTED

## 2018-08-19 ENCOUNTER — Telehealth: Payer: Self-pay | Admitting: General Practice

## 2018-08-19 NOTE — Telephone Encounter (Signed)
Pt's spouse called in for Covid result.  Advised of Not Detected result.

## 2018-08-24 DIAGNOSIS — G4733 Obstructive sleep apnea (adult) (pediatric): Secondary | ICD-10-CM | POA: Diagnosis not present

## 2018-09-16 ENCOUNTER — Encounter (INDEPENDENT_AMBULATORY_CARE_PROVIDER_SITE_OTHER): Payer: Self-pay | Admitting: Nurse Practitioner

## 2018-09-16 ENCOUNTER — Ambulatory Visit (INDEPENDENT_AMBULATORY_CARE_PROVIDER_SITE_OTHER): Payer: Medicare Other | Admitting: Nurse Practitioner

## 2018-09-16 ENCOUNTER — Other Ambulatory Visit: Payer: Self-pay

## 2018-09-16 VITALS — BP 131/78 | HR 73 | Ht 66.0 in | Wt 189.0 lb

## 2018-09-16 DIAGNOSIS — K5190 Ulcerative colitis, unspecified, without complications: Secondary | ICD-10-CM | POA: Diagnosis not present

## 2018-09-16 DIAGNOSIS — K219 Gastro-esophageal reflux disease without esophagitis: Secondary | ICD-10-CM | POA: Diagnosis not present

## 2018-09-16 NOTE — Progress Notes (Addendum)
Subjective:    Patient ID: David Mooney, male    DOB: [birth year], 78 y.o.   MRN: [id number]  Patient's last colonoscopy was in 2016.  He should definitely consider surveillance colonoscopy even if his labs are normal.  However if he has iron deficiency anemia may consider both EGD and colonoscopy as recommended by Ms. Berniece Pap NP.   David Mooney is a pleasant 78 year old male with a past medical history of GERD, gastric ulcers and ulcerative colitis.  He was initially diagnosed with ulcerative colitis at the age of 29.  He remains on mesalamine 1.2 g 2 tabs p.o. twice daily.  He denies having any upper or lower abdominal pain, however, he complains of abdominal bloat gas.  He frequently passes gas per the rectum.  He feels the gas buildup interferes with his ability to urinate.  He does not feel constipated.  He reports passing 1 or 2 formed brown stools once daily.  No rectal bleeding or mucus per the rectum.  No melena.  He reports having intermittent acid reflux.  Sometimes he has acid reflux symptoms for a few days in a row.  If he takes Gas-X before and after he eats he does not experience heartburn.  He rarely takes naproxen for aches and pains.  He is aware to avoid NSAIDs.  Appetite is good.  He has lost 5 pounds over the past few months.  No fever sweats or chills.  His most recent colonoscopy was 08/26/2012 which identified chronic active colitis to the cecum, otherwise was normal.  Small internal hemorrhoids noted.  No family history of colorectal cancer.  In review of his records, [place] studies done 01/04/2018 show that he has mild anemia with a hemoglobin 11.9. Hematocrit 35.8.  MCV 88.8.  Platelet 160.  In past years, he donated 1 unit of packed red blood cells 3 times yearly.  However, he has not donated blood for the past year.  Past Medical History:  Diagnosis Date  . Cataract   . GERD (gastroesophageal reflux disease)   . Glaucoma   . Hernia of unspecified site of  abdominal cavity without mention of obstruction or gangrene   . History of gastric ulcer   . Ulcerative colitis    Past Surgical History:  Procedure Laterality Date  . COLONOSCOPY    . COLONOSCOPY N/A 08/27/2012   Procedure: COLONOSCOPY;  Surgeon: Rogene Houston, MD;  Location: [facility]ITE;  Service: Endoscopy;  Laterality: N/A;  1200-moved to 13:15 Ann to notify pt  . HERNIA REPAIR    . MOLE REMOVAL     From head , chest  . UPPER GASTROINTESTINAL ENDOSCOPY     Current Outpatient Medications on File Prior to Visit  Medication Sig Dispense Refill  . acetaminophen (TYLENOL) 325 MG tablet Take 2 tablets (650 mg total) by mouth every 6 (six) hours.    Marland Kitchen atorvastatin (LIPITOR) 10 MG tablet Take 10 mg by mouth every morning.     . bimatoprost (LUMIGAN) 0.01 % SOLN Place 1 drop into both eyes at bedtime.     . Cholecalciferol (VITAMIN D) 2000 UNITS tablet Take 2,000 Units by mouth daily.     Marland Kitchen esomeprazole (NEXIUM) 40 MG capsule Take 40 mg by mouth daily before breakfast.    . fluticasone (FLONASE) 50 MCG/ACT nasal spray Place 2 sprays into both nostrils every morning.     . mesalamine (LIALDA) 1.2 g EC tablet TAKE 2 TABLETS BY MOUTH TWICE DAILY 120 tablet 5  .  methocarbamol (ROBAXIN) 500 MG tablet Take 1 tablet (500 mg total) by mouth every 8 (eight) hours as needed for muscle spasms. 30 tablet 0  . Multiple Vitamins tablet Take 1 tablet by mouth daily.     . Naproxen Sodium 220 MG CAPS Take 220 mg by mouth daily as needed (for pain).    . Phenyleph-CPM-DM-APAP (TYLENOL COLD MULTI-SYMPTOM PO) Take by mouth. Patient uses on as needed basis , when it is used it is twice a day.    . Probiotic Product (ALIGN PO) Take 1 tablet by mouth daily.     . tamsulosin (FLOMAX) 0.4 MG CAPS capsule Take 0.4 mg by mouth every morning.     . traMADol (ULTRAM) 50 MG tablet Take 1 tablet (50 mg total) by mouth every 6 (six) hours as needed for severe pain. 25 tablet 0   No current facility-administered  medications on file prior to visit.   No Known Allergies  Review of Systems  See HPI, all other systems reviewed and are negative    Objective:   Physical Exam  Blood pressure 131/78, pulse 73, height 5' 6"  (1.676 m), weight 189 lb (85.7 kg).  General: 78 year old male well-developed in no acute distress Eyes: Sclera nonicteric, conjunctiva pink  Neck: Supple Heart: Regular rate and rhythm, no murmurs Lungs: Clear throughout Abdomen: Distended, tympanic to percussion, soft, nontender with positive bowel sounds to all 4 quadrants, no HSM appreciated Extremities: No edema Neuro: Alert and oriented x4     Assessment & Plan:   61.  78 year old male with a history of ulcerative colitis stable on oral mesalamine -Discussed scheduling a surveillance colonoscopy as his last colonoscopy was in 2014 which identified chronic active colitis to the cecum without polyps -Continue mesalamine 1.2 g 2 tabs p.o. twice daily  2.  Abdominal bloat -Probiotic of choice once daily -Stool output reduces patient to start MiraLAX daily -Discussed ordering an abdominal x-ray versus CT if abdominal distention persist, await the ordered lab test results  3.  GERD -Continue as Meprazole 40 mg once daily.  May increase as omeprazole to twice daily if needed.  Gas-X 1 tab before and after meals as needed  4.  History of normocytic anemia 12/2017.  Patient previously donated PRBCs 3 times yearly, he has not donated any blood for the past year -Check CBC and CMP today -Will need EGD and colonoscopy if labs show anemia

## 2018-09-16 NOTE — Patient Instructions (Signed)
1.  Complete the ordered blood tests today  2. I will consult with Dr. Laural Golden regarding scheduling you for a colonoscopy and possible upper endoscopy as we discussed in office today  3. Continue taking your Mesalamine and Esomeprazole as previously ordered. Gas X as needed

## 2018-09-17 LAB — CBC WITH DIFFERENTIAL/PLATELET
Absolute Monocytes: 602 cells/uL (ref 200–950)
Basophils Absolute: 32 cells/uL (ref 0–200)
Basophils Relative: 0.5 %
Eosinophils Absolute: 109 cells/uL (ref 15–500)
Eosinophils Relative: 1.7 %
HCT: 38.6 % (ref 38.5–50.0)
Hemoglobin: 12.9 g/dL — ABNORMAL LOW (ref 13.2–17.1)
Lymphs Abs: 1843 cells/uL (ref 850–3900)
MCH: 30.4 pg (ref 27.0–33.0)
MCHC: 33.4 g/dL (ref 32.0–36.0)
MCV: 90.8 fL (ref 80.0–100.0)
MPV: 10.9 fL (ref 7.5–12.5)
Monocytes Relative: 9.4 %
Neutro Abs: 3814 cells/uL (ref 1500–7800)
Neutrophils Relative %: 59.6 %
Platelets: 172 10*3/uL (ref 140–400)
RBC: 4.25 10*6/uL (ref 4.20–5.80)
RDW: 13.1 % (ref 11.0–15.0)
Total Lymphocyte: 28.8 %
WBC: 6.4 10*3/uL (ref 3.8–10.8)

## 2018-09-17 LAB — COMPLETE METABOLIC PANEL WITH GFR
AG Ratio: 1.6 (calc) (ref 1.0–2.5)
ALT: 23 U/L (ref 9–46)
AST: 18 U/L (ref 10–35)
Albumin: 4.1 g/dL (ref 3.6–5.1)
Alkaline phosphatase (APISO): 53 U/L (ref 35–144)
BUN: 14 mg/dL (ref 7–25)
CO2: 26 mmol/L (ref 20–32)
Calcium: 9.8 mg/dL (ref 8.6–10.3)
Chloride: 109 mmol/L (ref 98–110)
Creat: 1.16 mg/dL (ref 0.70–1.18)
GFR, Est African American: 69 mL/min/{1.73_m2} (ref >=60)
GFR, Est Non African American: 60 mL/min/{1.73_m2} (ref >=60)
Globulin: 2.5 g/dL (calc) (ref 1.9–3.7)
Glucose, Bld: 92 mg/dL (ref 65–139)
Potassium: 4 mmol/L (ref 3.5–5.3)
Sodium: 143 mmol/L (ref 135–146)
Total Bilirubin: 0.4 mg/dL (ref 0.2–1.2)
Total Protein: 6.6 g/dL (ref 6.1–8.1)

## 2018-09-24 DIAGNOSIS — G4733 Obstructive sleep apnea (adult) (pediatric): Secondary | ICD-10-CM | POA: Diagnosis not present

## 2018-10-13 ENCOUNTER — Encounter (INDEPENDENT_AMBULATORY_CARE_PROVIDER_SITE_OTHER): Payer: Self-pay | Admitting: Nurse Practitioner

## 2018-10-14 ENCOUNTER — Other Ambulatory Visit (INDEPENDENT_AMBULATORY_CARE_PROVIDER_SITE_OTHER): Payer: Self-pay | Admitting: Nurse Practitioner

## 2018-10-14 ENCOUNTER — Telehealth (INDEPENDENT_AMBULATORY_CARE_PROVIDER_SITE_OTHER): Payer: Self-pay | Admitting: Nurse Practitioner

## 2018-10-14 DIAGNOSIS — D649 Anemia, unspecified: Secondary | ICD-10-CM

## 2018-10-14 DIAGNOSIS — K5190 Ulcerative colitis, unspecified, without complications: Secondary | ICD-10-CM

## 2018-10-14 NOTE — Telephone Encounter (Signed)
Patient called the office regarding the scheduling of his colonoscopy  -  Ph# [phone]

## 2018-10-14 NOTE — Telephone Encounter (Signed)
I called the patient and discussed Dr. Gentry Fitz recommendation to schedule a colonoscopy, see last office visit.  I will repeat a CBC with iron studies in 4 weeks, lab ordered will be mailed to the patient.  If he is found to be iron deficient an EGD would be ordered at the time of his colonoscopy.  Discussed the colonoscopy benefits and risks, including risk with sedation, risk of bleeding, perforation and infection.  And can you please call the patient to schedule a colonoscopy with moderate sedation, November is fine.

## 2018-10-15 ENCOUNTER — Encounter (INDEPENDENT_AMBULATORY_CARE_PROVIDER_SITE_OTHER): Payer: Self-pay | Admitting: *Deleted

## 2018-10-15 ENCOUNTER — Telehealth (INDEPENDENT_AMBULATORY_CARE_PROVIDER_SITE_OTHER): Payer: Self-pay | Admitting: *Deleted

## 2018-10-15 DIAGNOSIS — K5190 Ulcerative colitis, unspecified, without complications: Secondary | ICD-10-CM

## 2018-10-15 MED ORDER — PEG 3350-KCL-NA BICARB-NACL 420 G PO SOLR: 4000.0000 mL | Freq: Once | ORAL | 0 refills | Status: AC

## 2018-10-15 NOTE — Telephone Encounter (Signed)
Left message for patient to call me to schedule TCS

## 2018-10-15 NOTE — Telephone Encounter (Signed)
Patient needs trilyte TCS sch'd 11/19

## 2018-10-15 NOTE — Telephone Encounter (Signed)
TCS sch'd 12/04/18, patient aware, instructions mailed

## 2018-10-20 DIAGNOSIS — H35033 Hypertensive retinopathy, bilateral: Secondary | ICD-10-CM | POA: Diagnosis not present

## 2018-10-20 DIAGNOSIS — H524 Presbyopia: Secondary | ICD-10-CM | POA: Diagnosis not present

## 2018-10-20 DIAGNOSIS — H35363 Drusen (degenerative) of macula, bilateral: Secondary | ICD-10-CM | POA: Diagnosis not present

## 2018-10-20 DIAGNOSIS — H35371 Puckering of macula, right eye: Secondary | ICD-10-CM | POA: Diagnosis not present

## 2018-10-20 DIAGNOSIS — H401134 Primary open-angle glaucoma, bilateral, indeterminate stage: Secondary | ICD-10-CM | POA: Diagnosis not present

## 2018-10-22 DIAGNOSIS — D649 Anemia, unspecified: Secondary | ICD-10-CM | POA: Insufficient documentation

## 2018-10-22 DIAGNOSIS — K5190 Ulcerative colitis, unspecified, without complications: Secondary | ICD-10-CM | POA: Insufficient documentation

## 2018-10-27 DIAGNOSIS — Z961 Presence of intraocular lens: Secondary | ICD-10-CM | POA: Diagnosis not present

## 2018-11-21 DIAGNOSIS — E782 Mixed hyperlipidemia: Secondary | ICD-10-CM | POA: Diagnosis not present

## 2018-11-21 DIAGNOSIS — R7301 Impaired fasting glucose: Secondary | ICD-10-CM | POA: Diagnosis not present

## 2018-11-21 DIAGNOSIS — K219 Gastro-esophageal reflux disease without esophagitis: Secondary | ICD-10-CM | POA: Diagnosis not present

## 2018-11-21 DIAGNOSIS — E559 Vitamin D deficiency, unspecified: Secondary | ICD-10-CM | POA: Diagnosis not present

## 2018-11-26 DIAGNOSIS — R7301 Impaired fasting glucose: Secondary | ICD-10-CM | POA: Diagnosis not present

## 2018-11-26 DIAGNOSIS — G4733 Obstructive sleep apnea (adult) (pediatric): Secondary | ICD-10-CM | POA: Diagnosis not present

## 2018-11-26 DIAGNOSIS — K5180 Other ulcerative colitis without complications: Secondary | ICD-10-CM | POA: Diagnosis not present

## 2018-11-26 DIAGNOSIS — Z0001 Encounter for general adult medical examination with abnormal findings: Secondary | ICD-10-CM | POA: Diagnosis not present

## 2018-11-26 DIAGNOSIS — N182 Chronic kidney disease, stage 2 (mild): Secondary | ICD-10-CM | POA: Diagnosis not present

## 2018-11-26 DIAGNOSIS — Z23 Encounter for immunization: Secondary | ICD-10-CM | POA: Diagnosis not present

## 2018-12-02 ENCOUNTER — Other Ambulatory Visit: Payer: Self-pay

## 2018-12-02 ENCOUNTER — Other Ambulatory Visit (HOSPITAL_COMMUNITY)
Admission: RE | Admit: 2018-12-02 | Discharge: 2018-12-02 | Disposition: A | Discharge status: Home / Self Care | Payer: Medicare Other | Source: Ambulatory Visit | Attending: Internal Medicine | Admitting: Internal Medicine

## 2018-12-02 DIAGNOSIS — Z20828 Contact with and (suspected) exposure to other viral communicable diseases: Secondary | ICD-10-CM | POA: Insufficient documentation

## 2018-12-02 DIAGNOSIS — Z01812 Encounter for preprocedural laboratory examination: Secondary | ICD-10-CM | POA: Diagnosis not present

## 2018-12-02 LAB — SARS CORONAVIRUS 2 (TAT 6-24 HRS): SARS Coronavirus 2: NEGATIVE

## 2018-12-04 ENCOUNTER — Other Ambulatory Visit: Payer: Self-pay

## 2018-12-04 ENCOUNTER — Ambulatory Visit (HOSPITAL_COMMUNITY)
Admission: RE | Admit: 2018-12-04 | Discharge: 2018-12-04 | Disposition: A | Discharge status: Home / Self Care | Payer: Medicare Other | Source: Ambulatory Visit | Attending: Internal Medicine | Admitting: Internal Medicine

## 2018-12-04 ENCOUNTER — Encounter (HOSPITAL_COMMUNITY)
Admission: RE | Disposition: A | Discharge status: Home / Self Care | Payer: Self-pay | Source: Ambulatory Visit | Attending: Internal Medicine

## 2018-12-04 ENCOUNTER — Encounter (HOSPITAL_COMMUNITY): Payer: Self-pay | Admitting: *Deleted

## 2018-12-04 DIAGNOSIS — H409 Unspecified glaucoma: Secondary | ICD-10-CM | POA: Diagnosis not present

## 2018-12-04 DIAGNOSIS — K219 Gastro-esophageal reflux disease without esophagitis: Secondary | ICD-10-CM | POA: Diagnosis not present

## 2018-12-04 DIAGNOSIS — K6389 Other specified diseases of intestine: Secondary | ICD-10-CM | POA: Diagnosis not present

## 2018-12-04 DIAGNOSIS — Z7951 Long term (current) use of inhaled steroids: Secondary | ICD-10-CM | POA: Insufficient documentation

## 2018-12-04 DIAGNOSIS — K5100 Ulcerative (chronic) pancolitis without complications: Secondary | ICD-10-CM | POA: Diagnosis not present

## 2018-12-04 DIAGNOSIS — Z79899 Other long term (current) drug therapy: Secondary | ICD-10-CM | POA: Diagnosis not present

## 2018-12-04 DIAGNOSIS — D123 Benign neoplasm of transverse colon: Secondary | ICD-10-CM | POA: Insufficient documentation

## 2018-12-04 DIAGNOSIS — Z8249 Family history of ischemic heart disease and other diseases of the circulatory system: Secondary | ICD-10-CM | POA: Insufficient documentation

## 2018-12-04 DIAGNOSIS — D649 Anemia, unspecified: Secondary | ICD-10-CM | POA: Diagnosis not present

## 2018-12-04 DIAGNOSIS — Z09 Encounter for follow-up examination after completed treatment for conditions other than malignant neoplasm: Secondary | ICD-10-CM | POA: Diagnosis not present

## 2018-12-04 DIAGNOSIS — K5190 Ulcerative colitis, unspecified, without complications: Secondary | ICD-10-CM | POA: Insufficient documentation

## 2018-12-04 DIAGNOSIS — Z8711 Personal history of peptic ulcer disease: Secondary | ICD-10-CM | POA: Diagnosis not present

## 2018-12-04 HISTORY — PX: COLONOSCOPY: SHX5424

## 2018-12-04 HISTORY — PX: BIOPSY: SHX5522

## 2018-12-04 SURGERY — COLONOSCOPY
Anesthesia: Moderate Sedation

## 2018-12-04 MED ORDER — SODIUM CHLORIDE 0.9 % IV SOLN
INTRAVENOUS | Status: DC
  Administered 2018-12-04: 11:00:00 via INTRAVENOUS

## 2018-12-04 MED ORDER — MEPERIDINE HCL 50 MG/ML IJ SOLN
INTRAMUSCULAR | Status: DC | PRN
  Administered 2018-12-04 (×2): 25 mg

## 2018-12-04 MED ORDER — MIDAZOLAM HCL 5 MG/5ML IJ SOLN
INTRAMUSCULAR | Status: DC | PRN
  Administered 2018-12-04: 1 mg via INTRAVENOUS
  Administered 2018-12-04: 2 mg via INTRAVENOUS

## 2018-12-04 MED ORDER — MEPERIDINE HCL 50 MG/ML IJ SOLN
INTRAMUSCULAR | Status: AC
  Filled 2018-12-04: qty 1

## 2018-12-04 MED ORDER — MIDAZOLAM HCL 5 MG/5ML IJ SOLN
INTRAMUSCULAR | Status: AC
  Filled 2018-12-04: qty 10

## 2018-12-04 MED ORDER — STERILE WATER FOR IRRIGATION IR SOLN
Status: DC | PRN
  Administered 2018-12-04 (×2): 1.5 mL

## 2018-12-04 NOTE — Op Note (Signed)
[facility] Patient Name: David Mooney Procedure Date: 12/04/2018 11:14 AM MRN: [id number] Date of Birth: January 06, 1941 Attending MD: Hildred Laser , MD CSN: [id number] Age: 78 Admit Type: Outpatient Procedure:                Colonoscopy Indications:              High risk colon cancer surveillance: Ulcerative                            pancolitis of 8 (or more) years duration Providers:                Hildred Laser, MD, Gerome Sam, RN, Nelma Rothman,                            Technician Referring MD:             Curlene Labrum, MD Medicines:                Meperidine 50 mg IV, Midazolam 3 mg IV Complications:            No immediate complications. Estimated Blood Loss:     Estimated blood loss was minimal. Procedure:                After obtaining informed consent, the colonoscope                            was passed under direct vision. Throughout the                            procedure, the patient's blood pressure, pulse, and                            oxygen saturations were monitored continuously. The                            PCF-[id number] ([id number]) scope was introduced through                            the anus and advanced to the the terminal ileum,                            with identification of the appendiceal orifice and                            IC valve. The colonoscopy was performed without                            difficulty. The patient tolerated the procedure                            well. The quality of the bowel preparation was                            adequate. The terminal ileum, ileocecal valve,  appendiceal orifice, and rectum were photographed. Scope In: 11:26:32 AM Scope Out: 11:51:56 AM Scope Withdrawal Time: 0 hours 16 minutes 40 seconds  Total Procedure Duration: 0 hours 25 minutes 24 seconds  Findings:      The perianal and digital rectal examinations were normal.      The terminal ileum appeared normal.    An area of mildly congested mucosa was found in the cecum. This was       biopsied with a cold forceps for histology. The pathology specimen was       placed into Bottle Number 1.      A scar was found in the entire colon. {skip}healed colitis.      A 5 mm polyp was found in the splenic flexure. The polyp was sessile.       The pathology specimen was placed into Bottle Number 2.      A small polyp was found in the splenic flexure. Biopsies were taken with       a cold forceps for histology. The pathology specimen was placed into       Bottle Number 2.      The exam was otherwise normal throughout the examined colon. Impression:               - The examined portion of the ileum was normal.                           - Congested mucosa in the cecum. Biopsied.                           - Scar in the entire examined colon.                           - One 5 mm polyp at the splenic flexure.                           - One small polyp at the splenic flexure. Biopsied. Moderate Sedation:      Moderate (conscious) sedation was administered by the endoscopy nurse       and supervised by the endoscopist. The following parameters were       monitored: oxygen saturation, heart rate, blood pressure, CO2       capnography and response to care. Total physician intraservice time was       29 minutes. Recommendation:           - Patient has a contact number available for                            emergencies. The signs and symptoms of potential                            delayed complications were discussed with the                            patient. Return to normal activities tomorrow.                            Written discharge instructions were provided to the  patient.                           - Resume previous diet today.                           - Continue present medications.                           - No aspirin, ibuprofen, naproxen, or other                             non-steroidal anti-inflammatory drugs for 1 day.                           - Await pathology results.                           - Repeat colonoscopy for surveillance based on                            pathology results. Procedure Code(s):        --- Professional ---                           [phone], Colonoscopy, flexible; with biopsy, single                            or multiple                           99153, Moderate sedation; each additional 15                            minutes intraservice time                           G0500, Moderate sedation services provided by the                            same physician or other qualified health care                            professional performing a gastrointestinal                            endoscopic service that sedation supports,                            requiring the presence of an independent trained                            observer to assist in the monitoring of the                            patient's level of consciousness and physiological  status; initial 15 minutes of intra-service time;                            patient age 87 years or older (additional time may                            be reported with [phone], as appropriate) Diagnosis Code(s):        --- Professional ---                           K63.89, Other specified diseases of intestine                           K51.00, Ulcerative (chronic) pancolitis without                            complications                           K63.5, Polyp of colon CPT copyright 2019 American Medical Association. All rights reserved. The codes documented in this report are preliminary and upon coder review may  be revised to meet current compliance requirements. Hildred Laser, MD Hildred Laser, MD 12/04/2018 12:00:39 PM This report has been signed electronically. Number of Addenda: 0

## 2018-12-04 NOTE — H&P (Signed)
David Mooney is an 78 y.o. male.   Chief Complaint: Patient is here for colonoscopy. 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  He is on oral mesalamine.  Last colonoscopy was in August 2014.  He is here for surveillance examination.  He has 1 bowel movement daily or every other day.  Stools are formed.  He denies abdominal pain or rectal bleeding.  Family history is negative for CRC.   Past Medical History:  Diagnosis Date  . Cataract   . GERD (gastroesophageal reflux disease)   . Glaucoma   . Hernia of unspecified site of abdominal cavity without mention of obstruction or gangrene   . History of gastric ulcer   . Ulcerative colitis     Past Surgical History:  Procedure Laterality Date  . COLONOSCOPY    . COLONOSCOPY N/A 08/27/2012   Procedure: COLONOSCOPY;  Surgeon: Rogene Houston, MD;  Location: [facility];  Service: Endoscopy;  Laterality: N/A;  1200-moved to 13:15 Ann to notify pt  . HERNIA REPAIR    . MOLE REMOVAL     From head , chest  . UPPER GASTROINTESTINAL ENDOSCOPY      Family History  Problem Relation Age of Onset  . Arrhythmia Mother   . Anuerysm Father   . Hypertension Father   . Diabetes Father   . Healthy Sister   . Healthy Sister   . Irritable bowel syndrome Daughter   . Thyroid disease Daughter    Social History:  reports that he has never smoked. He has never used smokeless tobacco. He reports that he does not drink alcohol or use drugs.  Allergies: No Known Allergies  Medications Prior to Admission  Medication Sig Dispense Refill  . acetaminophen (TYLENOL) 500 MG tablet Take 500 mg by mouth every 6 (six) hours as needed for headache.    Marland Kitchen acetaminophen (TYLENOL) 650 MG CR tablet Take 650 mg by mouth every 8 (eight) hours as needed for pain.    Marland Kitchen atorvastatin (LIPITOR) 10 MG tablet Take 10 mg by mouth daily.     . bimatoprost  (LUMIGAN) 0.01 % SOLN Place 1 drop into both eyes at bedtime.     . cholecalciferol (VITAMIN D3) 25 MCG (1000 UT) tablet Take 1,000 Units by mouth daily.     Marland Kitchen esomeprazole (NEXIUM) 40 MG capsule Take 40 mg by mouth daily before breakfast.    . fluticasone (FLONASE) 50 MCG/ACT nasal spray Place 2 sprays into both nostrils daily.     Marland Kitchen guaiFENesin (MUCINEX) 600 MG 12 hr tablet Take 600 mg by mouth daily. expectorant    . mesalamine (LIALDA) 1.2 g EC tablet TAKE 2 TABLETS BY MOUTH TWICE DAILY (Patient taking differently: Take 1.2 g by mouth 2 (two) times daily. ) 120 tablet 5  . Multiple Vitamins tablet Take 1 tablet by mouth daily.     . Naproxen Sodium 220 MG CAPS Take 220 mg by mouth daily as needed (for pain).    Marland Kitchen oxybutynin (DITROPAN-XL) 5 MG 24 hr tablet Take 5 mg by mouth daily.     . Probiotic Product (ALIGN PO) Take 1 tablet by mouth daily.     . tamsulosin (FLOMAX) 0.4 MG CAPS capsule Take 0.4 mg by mouth daily.     Marland Kitchen acetaminophen (TYLENOL) 325 MG tablet Take 2 tablets (650 mg total) by mouth every 6 (six)  hours. (Patient not taking: Reported on 11/27/2018)    . methocarbamol (ROBAXIN) 500 MG tablet Take 1 tablet (500 mg total) by mouth every 8 (eight) hours as needed for muscle spasms. (Patient not taking: Reported on 11/27/2018) 30 tablet 0  . traMADol (ULTRAM) 50 MG tablet Take 1 tablet (50 mg total) by mouth every 6 (six) hours as needed for severe pain. (Patient not taking: Reported on 11/27/2018) 25 tablet 0    No results found for this or any previous visit (from the past 48 hour(s)). No results found.  ROS  Blood pressure (!) 149/81, pulse 66, temperature 97.8 F (36.6 C), temperature source Oral, resp. rate 16, height 5' 6.5" (1.689 m), weight 80.3 kg, SpO2 100 %. Physical Exam  Constitutional: He appears well-developed and well-nourished.  HENT:  Mouth/Throat: Oropharynx is clear and moist.  Eyes: Conjunctivae are normal.  Neck: No thyromegaly present.   Cardiovascular: Normal rate and regular rhythm.  Respiratory: Effort normal.  GI: Soft. He exhibits no distension and no mass. There is no abdominal tenderness.  Musculoskeletal:        General: No edema.  Lymphadenopathy:    He has no cervical adenopathy.  Neurological: He is alert.  Skin: Skin is warm and dry.     Assessment/Plan Chronic ulcerative colitis. Patient is in remission. Surveillance colonoscopy.  Hildred Laser, MD 12/04/2018, 11:18 AM

## 2018-12-04 NOTE — Discharge Instructions (Signed)
No aspirin or NSAIDs for 24 hours. Resume usual medications as before. Resume usual diet. No driving for 24 hours. Physician will call with biopsy results.   Colonoscopy, Adult, Care After This sheet gives you information about how to care for yourself after your procedure. Your doctor may also give you more specific instructions. If you have problems or questions, call your doctor. What can I expect after the procedure? After the procedure, it is common to have:  A small amount of blood in your poop for 24 hours.  Some gas.  Mild cramping or bloating in your belly. Follow these instructions at home: General instructions  For the first 24 hours after the procedure: ? Do not drive or use machinery. ? Do not sign important documents. ? Do not drink alcohol. ? Do your daily activities more slowly than normal. ? Eat foods that are soft and easy to digest.  Take over-the-counter or prescription medicines only as told by your doctor. To help cramping and bloating:   Try walking around.  Put heat on your belly (abdomen) as told by your doctor. Use a heat source that your doctor recommends, such as a moist heat pack or a heating pad. ? Put a towel between your skin and the heat source. ? Leave the heat on for 20-30 minutes. ? Remove the heat if your skin turns bright red. This is especially important if you cannot feel pain, heat, or cold. You can get burned. Eating and drinking   Drink enough fluid to keep your pee (urine) clear or pale yellow.  Return to your normal diet as told by your doctor. Avoid heavy or fried foods that are hard to digest.  Avoid drinking alcohol for as long as told by your doctor. Contact a doctor if:  You have blood in your poop (stool) 2-3 days after the procedure. Get help right away if:  You have more than a small amount of blood in your poop.  You see large clumps of tissue (blood clots) in your poop.  Your belly is swollen.  You feel  sick to your stomach (nauseous).  You throw up (vomit).  You have a fever.  You have belly pain that gets worse, and medicine does not help your pain. Summary  After the procedure, it is common to have a small amount of blood in your poop. You may also have mild cramping and bloating in your belly.  For the first 24 hours after the procedure, do not drive or use machinery, do not sign important documents, and do not drink alcohol.  Get help right away if you have a lot of blood in your poop, feel sick to your stomach, have a fever, or have more belly pain. This information is not intended to replace advice given to you by your health care provider. Make sure you discuss any questions you have with your health care provider. Document Released: 02/03/2010 Document Revised: 11/01/2016 Document Reviewed: 09/26/2015 Elsevier Patient Education  2020 [employer].  Colon Polyps  Polyps are tissue growths inside the body. Polyps can grow in many places, including the large intestine (colon). A polyp may be a round bump or a mushroom-shaped growth. You could have one polyp or several. Most colon polyps are noncancerous (benign). However, some colon polyps can become cancerous over time. Finding and removing the polyps early can help prevent this. What are the causes? The exact cause of colon polyps is not known. What increases the risk? You are more  likely to develop this condition if you:  Have a family history of colon cancer or colon polyps.  Are older than 67 or older than 45 if you are African American.  Have inflammatory bowel disease, such as ulcerative colitis or Crohn's disease.  Have certain hereditary conditions, such as: ? Familial adenomatous polyposis. ? Lynch syndrome. ? Turcot syndrome. ? Peutz-Jeghers syndrome.  Are overweight.  Smoke cigarettes.  Do not get enough exercise.  Drink too much alcohol.  Eat a diet that is high in fat and red meat and low in  fiber.  Had childhood cancer that was treated with abdominal radiation. What are the signs or symptoms? Most polyps do not cause symptoms. If you have symptoms, they may include:  Blood coming from your rectum when having a bowel movement.  Blood in your stool. The stool may look dark red or black.  Abdominal pain.  A change in bowel habits, such as constipation or diarrhea. How is this diagnosed? This condition is diagnosed with a colonoscopy. This is a procedure in which a lighted, flexible scope is inserted into the anus and then passed into the colon to examine the area. Polyps are sometimes found when a colonoscopy is done as part of routine cancer screening tests. How is this treated? Treatment for this condition involves removing any polyps that are found. Most polyps can be removed during a colonoscopy. Those polyps will then be tested for cancer. Additional treatment may be needed depending on the results of testing. Follow these instructions at home: Lifestyle  Maintain a healthy weight, or lose weight if recommended by your health care provider.  Exercise every day or as told by your health care provider.  Do not use any products that contain nicotine or tobacco, such as cigarettes and e-cigarettes. If you need help quitting, ask your health care provider.  If you drink alcohol, limit how much you have: ? 0-1 drink a day for women. ? 0-2 drinks a day for men.  Be aware of how much alcohol is in your drink. In the [place] one drink equals one 12 oz bottle of beer (355 mL), one 5 oz glass of wine (148 mL), or one 1 oz shot of hard liquor (44 mL). Eating and drinking   Eat foods that are high in fiber, such as fruits, vegetables, and whole grains.  Eat foods that are high in calcium and vitamin D, such as milk, cheese, yogurt, eggs, liver, fish, and broccoli.  Limit foods that are high in fat, such as fried foods and desserts.  Limit the amount of red meat and  processed meat you eat, such as hot dogs, sausage, bacon, and lunch meats. General instructions  Keep all follow-up visits as told by your health care provider. This is important. ? This includes having regularly scheduled colonoscopies. ? Talk to your health care provider about when you need a colonoscopy. Contact a health care provider if:  You have new or worsening bleeding during a bowel movement.  You have new or increased blood in your stool.  You have a change in bowel habits.  You lose weight for no known reason. Summary  Polyps are tissue growths inside the body. Polyps can grow in many places, including the colon.  Most colon polyps are noncancerous (benign), but some can become cancerous over time.  This condition is diagnosed with a colonoscopy.  Treatment for this condition involves removing any polyps that are found. Most polyps can be removed during a  colonoscopy. This information is not intended to replace advice given to you by your health care provider. Make sure you discuss any questions you have with your health care provider. Document Released: 09/28/2003 Document Revised: 04/18/2017 Document Reviewed: 04/18/2017 Elsevier Patient Education  2020 [employer].

## 2018-12-05 LAB — SURGICAL PATHOLOGY

## 2018-12-08 ENCOUNTER — Encounter (HOSPITAL_COMMUNITY): Payer: Self-pay | Admitting: Internal Medicine

## 2019-01-06 ENCOUNTER — Other Ambulatory Visit: Payer: Self-pay

## 2019-01-06 NOTE — Patient Outreach (Signed)
[place] [facility]) Care Management  01/06/2019  KAYMAN SNUFFER [birth year] [id number]   Medication Adherence call to Mr. Carolanne Grumbling HIPPA Compliant Voice message left with a call back number. Mr. Aughenbaugh is showing past due on Atorvastatin 10 mg under [place].  [place] Management Direct Dial [phone]  Fax [phone] [email or web address]

## 2019-01-13 ENCOUNTER — Other Ambulatory Visit: Payer: Self-pay

## 2019-01-13 NOTE — Patient Outreach (Signed)
[place] [facility]) Care Management  01/13/2019  David Mooney 01-20-1940 [id number]   Medication Adherence call to Mr. David Mooney [facility] Ident[employer] Verify spoke with patient he is past due on Atorvastatin 10 mg,patient explain he takes 1 tablet daily and has enough for 3 more month patient explain at some point he receive a duplicate. David Mooney is showing past due under [place].   [facility] Direct Dial [phone]  Fax [phone] [email or web address]

## 2019-01-23 DIAGNOSIS — H401131 Primary open-angle glaucoma, bilateral, mild stage: Secondary | ICD-10-CM | POA: Diagnosis not present

## 2019-01-23 DIAGNOSIS — H18593 Other hereditary corneal dystrophies, bilateral: Secondary | ICD-10-CM | POA: Diagnosis not present

## 2019-01-23 DIAGNOSIS — H04123 Dry eye syndrome of bilateral lacrimal glands: Secondary | ICD-10-CM | POA: Diagnosis not present

## 2019-03-16 ENCOUNTER — Ambulatory Visit (INDEPENDENT_AMBULATORY_CARE_PROVIDER_SITE_OTHER): Payer: Medicare Other | Admitting: Nurse Practitioner

## 2019-04-16 ENCOUNTER — Other Ambulatory Visit (INDEPENDENT_AMBULATORY_CARE_PROVIDER_SITE_OTHER): Payer: Self-pay | Admitting: Internal Medicine

## 2019-04-23 ENCOUNTER — Other Ambulatory Visit (INDEPENDENT_AMBULATORY_CARE_PROVIDER_SITE_OTHER): Payer: Self-pay | Admitting: Internal Medicine

## 2019-05-12 IMAGING — CT CT CERVICAL SPINE W/O CM
4 of 7 series · 12 of 33 positions shown, 13 images · non-contrast
Comparison: None.

CLINICAL DATA: Fell 9 feet off a ladder on [REDACTED], hitting his
head.

EXAM:
CT HEAD WITHOUT CONTRAST
CT CERVICAL SPINE WITHOUT CONTRAST
TECHNIQUE: Multidetector CT imaging of the head and cervical spine was
performed following the standard protocol without intravenous
contrast. Multiplanar CT image reconstructions of the cervical spine
were also generated.

[Series 4: head bone · axial · 0.43mm/px · z∈[+155,+235]mm · 3 of 80 slices shown]
[im 20/80  bone]
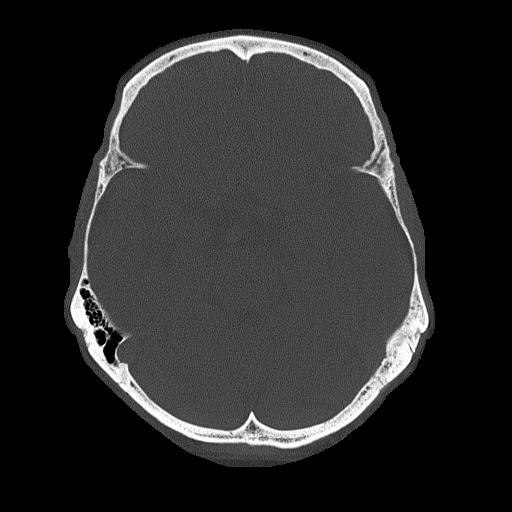
[im 40/80  bone]
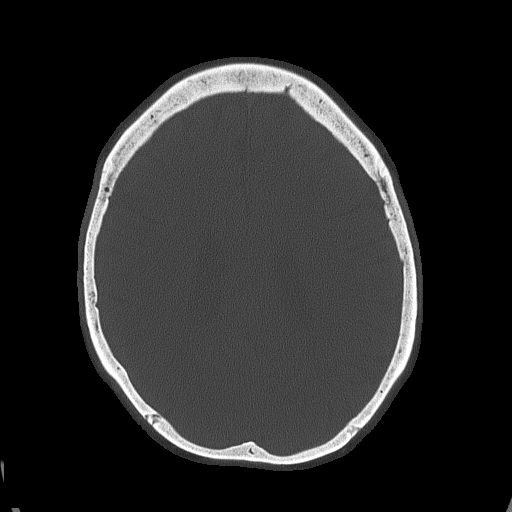
[im 60/80  bone]
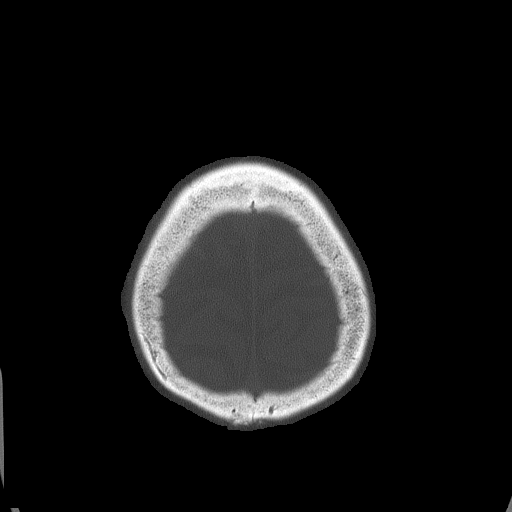

[Series 8: c spine soft · axial · 0.35mm/px · z∈[-30,+80]mm · 4 of 93 slices shown, 5 images]
[im 19/93  soft-tissue]
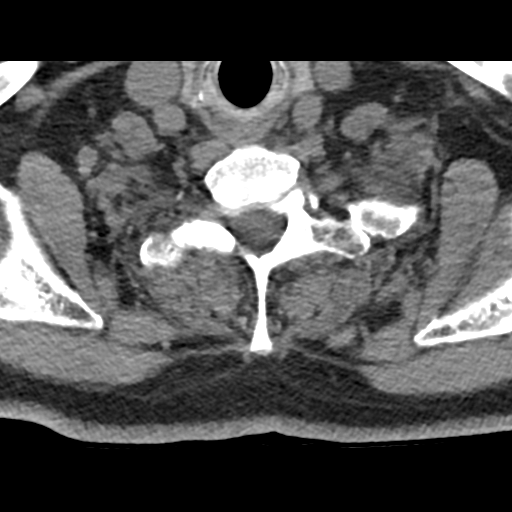
[im 19/93  bone]
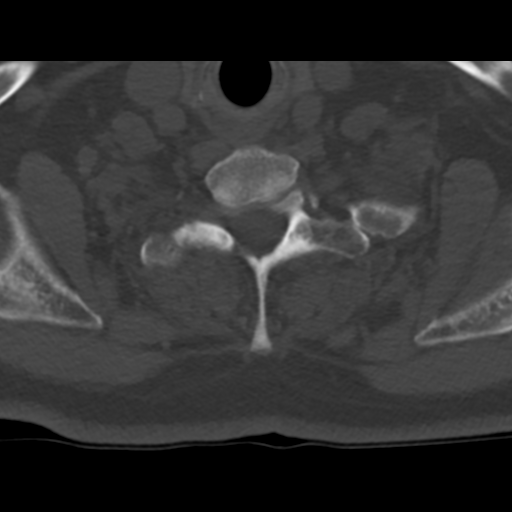
[im 37/93  bone]
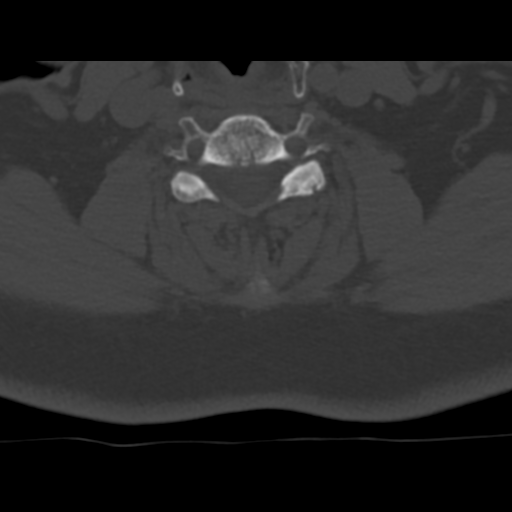
[im 56/93  bone]
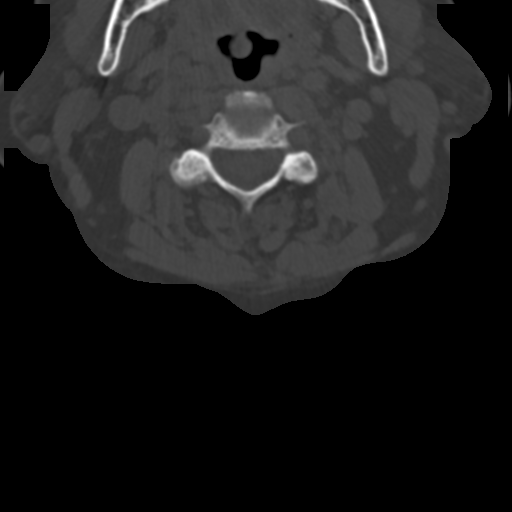
[im 74/93  bone]
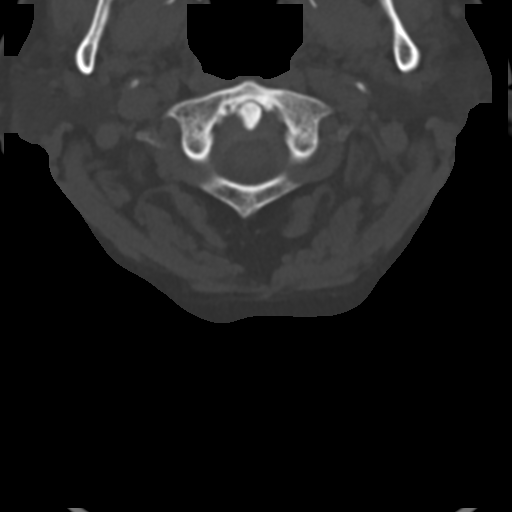

[Series 9: sagittal bone · sagittal · 0.27mm/px · 4 of 61 slices shown]
[im 13/61  bone]
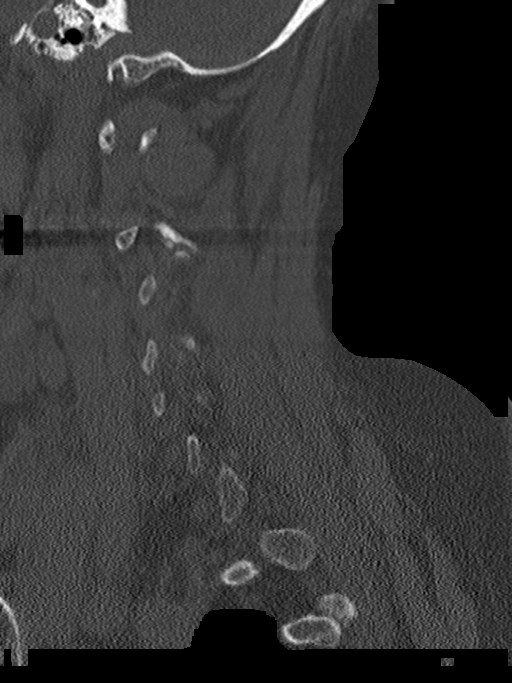
[im 25/61  bone]
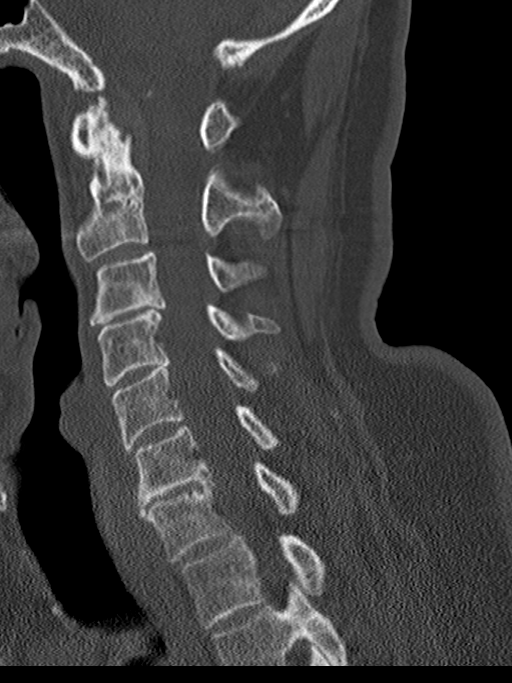
[im 37/61  bone]
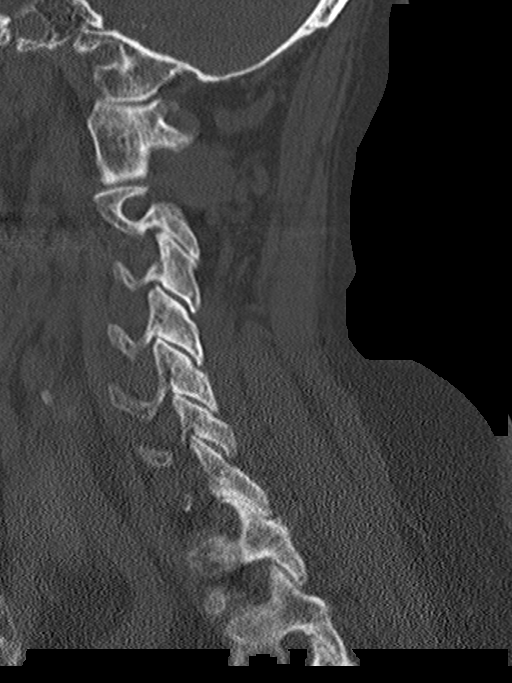
[im 49/61  bone]
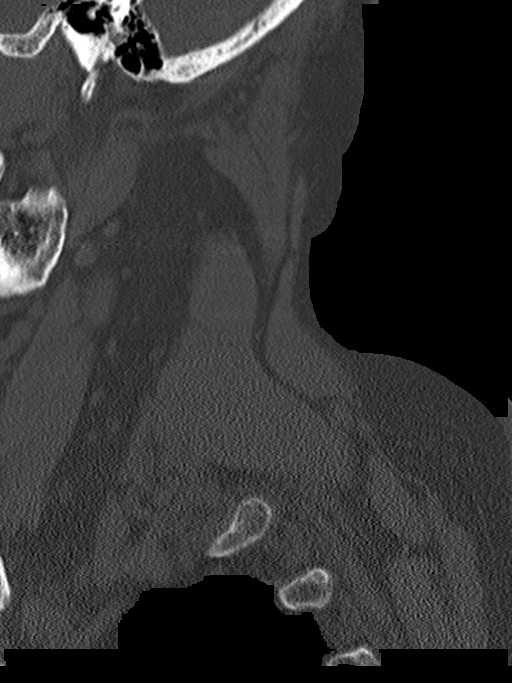

[Series 10: coronal bone · coronal · 0.27mm/px · 1 of 61 slices shown]
[im 31/61  bone]
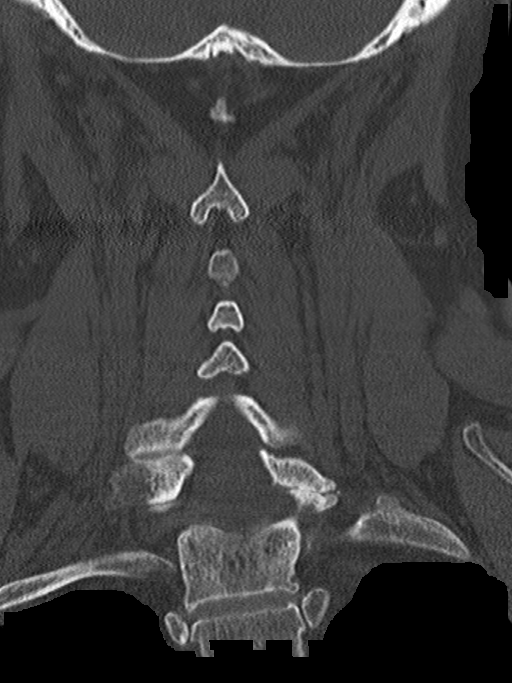

[12 of 33 positions shown; findings below may reference images not displayed]

FINDINGS: CT HEAD FINDINGS

Brain: No evidence of acute infarction, hemorrhage, hydrocephalus,
extra-axial collection or mass lesion/mass effect. Mild age related
cerebral atrophy.

Vascular: Calcified atherosclerosis at the skullbase. No hyperdense
vessel. Dolichoectasia of the basilar artery.

Skull: Normal. Negative for fracture or focal lesion.

Sinuses/Orbits: No acute finding.

Other: None.

CT CERVICAL SPINE FINDINGS

Alignment: Facet mediated trace anterolisthesis at C7-T1. No
traumatic malalignment.

Skull base and vertebrae: No acute fracture. No primary bone lesion
or focal pathologic process.

Soft tissues and spinal canal: No prevertebral fluid or swelling. No
visible canal hematoma.

Disc levels: Moderate disc height loss and uncovertebral hypertrophy
at C3-C4, C4-C5, and C6-C7. Mild disc height loss at C5-C6 and
C7-T1. Moderate bilateral facet arthropathy at C2-C3 and C7-T1.

Upper chest: Negative.

Other: None.
IMPRESSION: 1.  No acute intracranial abnormality.
2.  No acute cervical spine fracture.

## 2019-05-25 ENCOUNTER — Ambulatory Visit (INDEPENDENT_AMBULATORY_CARE_PROVIDER_SITE_OTHER): Payer: Medicare Other | Admitting: Internal Medicine

## 2019-05-26 DIAGNOSIS — H18593 Other hereditary corneal dystrophies, bilateral: Secondary | ICD-10-CM | POA: Diagnosis not present

## 2019-05-26 DIAGNOSIS — H04123 Dry eye syndrome of bilateral lacrimal glands: Secondary | ICD-10-CM | POA: Diagnosis not present

## 2019-05-26 DIAGNOSIS — H401131 Primary open-angle glaucoma, bilateral, mild stage: Secondary | ICD-10-CM | POA: Diagnosis not present

## 2019-05-26 DIAGNOSIS — H35371 Puckering of macula, right eye: Secondary | ICD-10-CM | POA: Diagnosis not present

## 2019-05-26 DIAGNOSIS — H35363 Drusen (degenerative) of macula, bilateral: Secondary | ICD-10-CM | POA: Diagnosis not present

## 2019-06-01 DIAGNOSIS — R7301 Impaired fasting glucose: Secondary | ICD-10-CM | POA: Diagnosis not present

## 2019-06-01 DIAGNOSIS — N183 Chronic kidney disease, stage 3 unspecified: Secondary | ICD-10-CM | POA: Diagnosis not present

## 2019-06-01 DIAGNOSIS — K219 Gastro-esophageal reflux disease without esophagitis: Secondary | ICD-10-CM | POA: Diagnosis not present

## 2019-06-01 DIAGNOSIS — E782 Mixed hyperlipidemia: Secondary | ICD-10-CM | POA: Diagnosis not present

## 2019-06-08 DIAGNOSIS — N182 Chronic kidney disease, stage 2 (mild): Secondary | ICD-10-CM | POA: Diagnosis not present

## 2019-06-08 DIAGNOSIS — K5180 Other ulcerative colitis without complications: Secondary | ICD-10-CM | POA: Diagnosis not present

## 2019-06-08 DIAGNOSIS — E782 Mixed hyperlipidemia: Secondary | ICD-10-CM | POA: Diagnosis not present

## 2019-06-08 DIAGNOSIS — G4733 Obstructive sleep apnea (adult) (pediatric): Secondary | ICD-10-CM | POA: Diagnosis not present

## 2019-06-08 DIAGNOSIS — R7301 Impaired fasting glucose: Secondary | ICD-10-CM | POA: Diagnosis not present

## 2019-08-25 ENCOUNTER — Telehealth (INDEPENDENT_AMBULATORY_CARE_PROVIDER_SITE_OTHER): Payer: Self-pay | Admitting: Internal Medicine

## 2019-08-25 NOTE — Telephone Encounter (Signed)
See message below °

## 2019-08-25 NOTE — Telephone Encounter (Signed)
Patient was called and message was left asking that he return call to office. We are not sure what lab work he is speaking of.

## 2019-08-25 NOTE — Telephone Encounter (Signed)
Patient left voice mail message regarding lab work  - please advise - [phone]

## 2019-08-26 DIAGNOSIS — H401131 Primary open-angle glaucoma, bilateral, mild stage: Secondary | ICD-10-CM | POA: Diagnosis not present

## 2019-10-27 DIAGNOSIS — J329 Chronic sinusitis, unspecified: Secondary | ICD-10-CM | POA: Diagnosis not present

## 2019-11-30 ENCOUNTER — Ambulatory Visit (INDEPENDENT_AMBULATORY_CARE_PROVIDER_SITE_OTHER): Payer: Medicare Other | Admitting: Gastroenterology

## 2019-12-01 ENCOUNTER — Encounter (INDEPENDENT_AMBULATORY_CARE_PROVIDER_SITE_OTHER): Payer: Self-pay | Admitting: Internal Medicine

## 2019-12-01 ENCOUNTER — Ambulatory Visit (INDEPENDENT_AMBULATORY_CARE_PROVIDER_SITE_OTHER): Payer: Medicare Other | Admitting: Internal Medicine

## 2019-12-01 ENCOUNTER — Other Ambulatory Visit: Payer: Self-pay

## 2019-12-01 VITALS — BP 144/83 | HR 76 | Temp 97.8°F | Ht 66.0 in | Wt 193.5 lb

## 2019-12-01 DIAGNOSIS — K5100 Ulcerative (chronic) pancolitis without complications: Secondary | ICD-10-CM

## 2019-12-01 MED ORDER — MESALAMINE 1.2 G PO TBEC: 2.4000 g | DELAYED_RELEASE_TABLET | Freq: Two times a day (BID) | ORAL | 3 refills | Status: DC

## 2019-12-01 NOTE — Patient Instructions (Signed)
Will request copy of recent blood work from Dr. Lizbeth Bark office. Notify if you experience rectal bleeding or diarrhea.

## 2019-12-01 NOTE — Progress Notes (Signed)
Presenting complaint;  Follow-up for ulcerative colitis.  Database and subjective:  Patient is 79 year old Caucasian male with chronic ulcerative colitis who is here for scheduled visit.  He has been maintained on oral mesalamine. His condition was diagnosed in 1963 when he was in college.  His last colonoscopy was 1 year ago revealing mild disease in cecum and two small tubular adenomas.  He he feels well.  He states he was treated with Augmentin for 10 days for sinus infection and develop diarrhea he did not experience cramping rectal bleeding fever or chills.  He is back to his baseline.  He generally has 1 formed stool daily.  He may have soft stool once a week or less often.  He denies abdominal pain melena or rectal bleeding nocturnal bowel movements or accidents.  He says his appetite is very good.  He has gained 4 pounds since last visit. He had routine blood work by Dr. Pleas Koch recently.  Current Medications: Outpatient Encounter Medications as of 12/01/2019  Medication Sig  . acetaminophen (TYLENOL) 500 MG tablet Take 500 mg by mouth every 6 (six) hours as needed for headache.  Marland Kitchen acetaminophen (TYLENOL) 650 MG CR tablet Take 650 mg by mouth every 8 (eight) hours as needed for pain.  Marland Kitchen atorvastatin (LIPITOR) 10 MG tablet Take 10 mg by mouth daily.   . bimatoprost (LUMIGAN) 0.01 % SOLN Place 1 drop into both eyes at bedtime.   . cholecalciferol (VITAMIN D3) 25 MCG (1000 UT) tablet Take 1,000 Units by mouth daily.   Marland Kitchen esomeprazole (NEXIUM) 40 MG capsule Take 40 mg by mouth daily before breakfast.  . fluticasone (FLONASE) 50 MCG/ACT nasal spray Place 2 sprays into both nostrils daily.   Marland Kitchen guaiFENesin (MUCINEX) 600 MG 12 hr tablet Take 600 mg by mouth daily. expectorant  . mesalamine (LIALDA) 1.2 g EC tablet TAKE 2 TABLETS BY MOUTH TWICE DAILY  . Multiple Vitamins tablet Take 1 tablet by mouth daily.   Marland Kitchen oxybutynin (DITROPAN-XL) 5 MG 24 hr tablet Take 5 mg by mouth daily.   . Probiotic  Product (ALIGN PO) Take 1 tablet by mouth daily.   . tamsulosin (FLOMAX) 0.4 MG CAPS capsule Take 0.4 mg by mouth daily.    No facility-administered encounter medications on file as of 12/01/2019.     Objective: Blood pressure (!) 144/83, pulse 76, temperature 97.8 F (36.6 C), temperature source Oral, height _0  (1.676 m), weight 193 lb 8 oz (87.8 kg). Patient is alert and in no acute distress. He is wearing a mask. Conjunctiva is pink. Sclera is nonicteric Oropharyngeal mucosa is normal. No neck masses or thyromegaly noted. Cardiac exam with regular rhythm normal S1 and S2. No murmur or gallop noted. Lungs are clear to auscultation. Abdomen is symmetrical with small umbilical hernia.  On palpation abdomen is soft and nontender with organomegaly or masses. No LE edema or clubbing noted. He has slight deformity to tip of left index finger.  Labs/studies Results:  CBC Latest Ref Rng & Units 09/16/2018 01/04/2018 01/03/2018  WBC 3.8 - 10.8 Thousand/uL 6.4 7.2 12.3(H)  Hemoglobin 13.2 - 17.1 g/dL 12.9(L) 11.9(L) 11.6(L)  Hematocrit 38 - 50 % 38.6 35.8(L) 34.7(L)  Platelets 140 - 400 Thousand/uL 172 160 144(L)    CMP Latest Ref Rng & Units 09/16/2018 01/02/2018  Glucose 65 - 139 mg/dL 92 148(H)  BUN 7 - 25 mg/dL 14 11  Creatinine 0.70 - 1.18 mg/dL 1.16 0.86  Sodium 135 - 146 mmol/L 143 137  Potassium 3.5 - 5.3 mmol/L 4.0 4.5  Chloride 98 - 110 mmol/L 109 107  CO2 20 - 32 mmol/L 26 22  Calcium 8.6 - 10.3 mg/dL 9.8 9.6  Total Protein 6.1 - 8.1 g/dL 6.6 7.7  Total Bilirubin 0.2 - 1.2 mg/dL 0.4 1.1  Alkaline Phos 38 - 126 U/L - 39  AST 10 - 35 U/L 18 27  ALT 9 - 46 U/L 23 31    Hepatic Function Latest Ref Rng & Units 09/16/2018 01/02/2018  Total Protein 6.1 - 8.1 g/dL 6.6 7.7  Albumin 3.5 - 5.0 g/dL - 4.4  AST 10 - 35 U/L 18 27  ALT 9 - 46 U/L 23 31  Alk Phosphatase 38 - 126 U/L - 39  Total Bilirubin 0.2 - 1.2 mg/dL 0.4 1.1    Lab Results  Component Value Date   CRP 2.3  09/12/2017      Assessment:  #1.  Chronic ulcerative colitis.  He is presently on oral mesalamine.  Last colonoscopy was 1 year ago revealing mild focal cecal colitis.  #2.  History of colonic polyps.  He had 2 small tubular adenomas on his last colonoscopy 1 year ago.Will consider next exam in 4 years as long as he remains in good health.   Plan:  Lialda prescription sent to patient,s pharmacy for three months with three refills. Request copy of recent blood work from Dr. Lizbeth Bark office. Office visit in 1 year.

## 2019-12-07 DIAGNOSIS — N183 Chronic kidney disease, stage 3 unspecified: Secondary | ICD-10-CM | POA: Diagnosis not present

## 2019-12-07 DIAGNOSIS — R7301 Impaired fasting glucose: Secondary | ICD-10-CM | POA: Diagnosis not present

## 2019-12-07 DIAGNOSIS — K219 Gastro-esophageal reflux disease without esophagitis: Secondary | ICD-10-CM | POA: Diagnosis not present

## 2019-12-07 DIAGNOSIS — Z1329 Encounter for screening for other suspected endocrine disorder: Secondary | ICD-10-CM | POA: Diagnosis not present

## 2019-12-07 DIAGNOSIS — E782 Mixed hyperlipidemia: Secondary | ICD-10-CM | POA: Diagnosis not present

## 2019-12-30 DIAGNOSIS — D126 Benign neoplasm of colon, unspecified: Secondary | ICD-10-CM | POA: Diagnosis not present

## 2019-12-30 DIAGNOSIS — N182 Chronic kidney disease, stage 2 (mild): Secondary | ICD-10-CM | POA: Diagnosis not present

## 2019-12-30 DIAGNOSIS — M19049 Primary osteoarthritis, unspecified hand: Secondary | ICD-10-CM | POA: Diagnosis not present

## 2019-12-30 DIAGNOSIS — E782 Mixed hyperlipidemia: Secondary | ICD-10-CM | POA: Diagnosis not present

## 2019-12-30 DIAGNOSIS — Z0001 Encounter for general adult medical examination with abnormal findings: Secondary | ICD-10-CM | POA: Diagnosis not present

## 2019-12-31 DIAGNOSIS — H401131 Primary open-angle glaucoma, bilateral, mild stage: Secondary | ICD-10-CM | POA: Diagnosis not present

## 2020-02-11 ENCOUNTER — Other Ambulatory Visit (INDEPENDENT_AMBULATORY_CARE_PROVIDER_SITE_OTHER): Payer: Self-pay | Admitting: Internal Medicine

## 2020-02-11 NOTE — Telephone Encounter (Signed)
Placed on Dr. Olevia Perches desk for approval.

## 2020-02-12 ENCOUNTER — Other Ambulatory Visit (INDEPENDENT_AMBULATORY_CARE_PROVIDER_SITE_OTHER): Payer: Self-pay | Admitting: Internal Medicine

## 2020-02-16 ENCOUNTER — Encounter (INDEPENDENT_AMBULATORY_CARE_PROVIDER_SITE_OTHER): Payer: Self-pay

## 2020-03-28 ENCOUNTER — Encounter: Payer: Self-pay | Admitting: Urology

## 2020-03-28 ENCOUNTER — Other Ambulatory Visit: Payer: Self-pay

## 2020-03-28 ENCOUNTER — Ambulatory Visit (INDEPENDENT_AMBULATORY_CARE_PROVIDER_SITE_OTHER): Payer: Medicare Other | Admitting: Urology

## 2020-03-28 VITALS — BP 104/68 | HR 65 | Temp 98.5°F | Ht 66.0 in | Wt 184.0 lb

## 2020-03-28 DIAGNOSIS — R3915 Urgency of urination: Secondary | ICD-10-CM | POA: Insufficient documentation

## 2020-03-28 DIAGNOSIS — N138 Other obstructive and reflux uropathy: Secondary | ICD-10-CM | POA: Diagnosis not present

## 2020-03-28 DIAGNOSIS — N401 Enlarged prostate with lower urinary tract symptoms: Secondary | ICD-10-CM | POA: Insufficient documentation

## 2020-03-28 DIAGNOSIS — R351 Nocturia: Secondary | ICD-10-CM | POA: Diagnosis not present

## 2020-03-28 LAB — BLADDER SCAN AMB NON-IMAGING: Scan Result: 151

## 2020-03-28 MED ORDER — ALFUZOSIN HCL ER 10 MG PO TB24: 10.0000 mg | ORAL_TABLET | Freq: Every day | ORAL | 11 refills | Status: DC

## 2020-03-28 NOTE — Patient Instructions (Signed)

## 2020-03-28 NOTE — Addendum Note (Signed)
Addended byIris Pert on: 03/28/2020 04:40 PM   Modules accepted: Orders

## 2020-03-28 NOTE — Progress Notes (Signed)
03/28/2020 4:26 PM   David Mooney 03-20-1940 [id number]  Referring provider: Curlene Labrum, MD [place],  [place] [address]  Nocturia  HPI: David Mooney is a 80yo here for evaluation of BPH, nocturia and urinary urgency. He was previously seen by Dr. Diona Fanti fro BPH and is currently on flomax 0.55m daily. Nocturia 3x. He has an intermittent weak stream, mild hesitancy He was previously on vesicare 581mwhich worked well but was expensive. He has intermittent dysuria with a full bladder. No other complaints today. PVR 151cc. IPSS 18 with QOL 4.    PMH: Past Medical History:  Diagnosis Date  . Cataract   . GERD (gastroesophageal reflux disease)   . Glaucoma   . Hernia of unspecified site of abdominal cavity without mention of obstruction or gangrene   . History of gastric ulcer   . Ulcerative colitis     Surgical History: Past Surgical History:  Procedure Laterality Date  . BIOPSY  12/04/2018   Procedure: BIOPSY;  Surgeon: ReRogene HoustonMD;  [facility];  Service: Endoscopy;;  cecum  . COLONOSCOPY    . COLONOSCOPY N/A 08/27/2012   Procedure: COLONOSCOPY;  Surgeon: NaRogene HoustonMD;  [facility];  Service: Endoscopy;  Laterality: N/A;  1200-moved to 13:15 Ann to notify pt  . COLONOSCOPY N/A 12/04/2018   Procedure: COLONOSCOPY;  Surgeon: ReRogene HoustonMD;  [facility];  Service: Endoscopy;  Laterality: N/A;  155pm  . HERNIA REPAIR    . MOLE REMOVAL     From head , chest  . UPPER GASTROINTESTINAL ENDOSCOPY      Home Medications:  Allergies as of 03/28/2020   No Known Allergies     Medication List       Accurate as of March 28, 2020  4:26 PM. If you have any questions, ask your nurse or doctor.        STOP taking these medications   atorvastatin 10 MG tablet Commonly known as: LIPITOR Stopped by: PaNicolette BangMD   atorvastatin 20 MG tablet Commonly known as: LIPITOR Stopped by: PaNicolette BangMD    oxybutynin 5 MG 24 hr tablet Commonly known as: DITROPAN-XL Stopped by: PaNicolette BangMD     TAKE these medications   acetaminophen 500 MG tablet Commonly known as: TYLENOL Take 500 mg by mouth every 6 (six) hours as needed for headache.   acetaminophen 650 MG CR tablet Commonly known as: TYLENOL Take 650 mg by mouth every 8 (eight) hours as needed for pain.   ALIGN PO Take 1 tablet by mouth daily.   bimatoprost 0.01 % Soln Commonly known as: LUMIGAN Place 1 drop into both eyes at bedtime.   cholecalciferol 25 MCG (1000 UNIT) tablet Commonly known as: VITAMIN D3 Take 1,000 Units by mouth daily.   esomeprazole 40 MG capsule Commonly known as: NEXIUM Take 40 mg by mouth daily before breakfast.   fluticasone 50 MCG/ACT nasal spray Commonly known as: FLONASE Place 2 sprays into both nostrils daily.   guaiFENesin 600 MG 12 hr tablet Commonly known as: MUCINEX Take 600 mg by mouth daily. expectorant   magnesium oxide 400 MG tablet Commonly known as: MAG-OX Take 400 mg by mouth daily.   mesalamine 1.2 g EC tablet Commonly known as: LIALDA TAKE 2 TABLETS BY MOUTH TWICE DAILY   Multiple Vitamins tablet Take 1 tablet by mouth daily.   psyllium 58.6 % packet Commonly known as: METAMUCIL  Take 1 packet by mouth daily.   tamsulosin 0.4 MG Caps capsule Commonly known as: FLOMAX Take 0.4 mg by mouth daily.       Allergies: No Known Allergies  Family History: Family History  Problem Relation Age of Onset  . Arrhythmia Mother   . Anuerysm Father   . Hypertension Father   . Diabetes Father   . Healthy Sister   . Healthy Sister   . Irritable bowel syndrome Daughter   . Thyroid disease Daughter     Social History:  reports that he has never smoked. He has never used smokeless tobacco. He reports that he does not drink alcohol and does not use drugs.  ROS: All other review of systems were reviewed and are negative except what is noted above in  HPI  Physical Exam: BP 104/68   Pulse 65   Temp 98.5 F (36.9 C)   Ht 5' 6"  (1.676 m)   Wt 184 lb (83.5 kg)   BMI 29.70 kg/m   Constitutional:  Alert and oriented, No acute distress. HEENT: [place] AT, moist mucus membranes.  Trachea midline, no masses. Cardiovascular: No clubbing, cyanosis, or edema. Respiratory: Normal respiratory effort, no increased work of breathing. GI: Abdomen is soft, nontender, nondistended, no abdominal masses GU: No CVA tenderness. Circumcised phallus. No masses/lesions on penis, testis, scrotum. Prostate 40g smooth no nodules no induration.  Lymph: No cervical or inguinal lymphadenopathy. Skin: No rashes, bruises or suspicious lesions. Neurologic: Grossly intact, no focal deficits, moving all 4 extremities. Psychiatric: Normal mood and affect.  Laboratory Data: Lab Results  Component Value Date   WBC 6.4 09/16/2018   HGB 12.9 (L) 09/16/2018   HCT 38.6 09/16/2018   MCV 90.8 09/16/2018   PLT 172 09/16/2018    Lab Results  Component Value Date   CREATININE 1.16 09/16/2018    No results found for: PSA  No results found for: TESTOSTERONE  No results found for: HGBA1C  Urinalysis    Component Value Date/Time   COLORURINE YELLOW 01/02/2018 [place] 01/02/2018 1433   LABSPEC 1.011 01/02/2018 1433   PHURINE 6.0 01/02/2018 1433   GLUCOSEU NEGATIVE 01/02/2018 1433   HGBUR SMALL (A) 01/02/2018 1433   BILIRUBINUR NEGATIVE 01/02/2018 1433   KETONESUR 5 (A) 01/02/2018 1433   PROTEINUR NEGATIVE 01/02/2018 1433   NITRITE NEGATIVE 01/02/2018 1433   LEUKOCYTESUR NEGATIVE 01/02/2018 1433    Lab Results  Component Value Date   BACTERIA NONE SEEN 01/02/2018    Pertinent Imaging:  No results found for this or any previous visit.  No results found for this or any previous visit.  No results found for this or any previous visit.  No results found for this or any previous visit.  No results found for this or any previous  visit.  No results found for this or any previous visit.  No results found for this or any previous visit.  No results found for this or any previous visit.   Assessment & Plan:    1. Nocturia -uroxatral 66m qhs. RTC 4-6 weeks for possible cystoscopy - Urinalysis, Routine w reflex microscopic - BLADDER SCAN AMB NON-IMAGING  2. Urgency of urination -We will likely start vesicare 529mnext visit if his nocturia improves  3. Benign prostatic hyperplasia with urinary obstruction -uroxatral 1063m  No follow-ups on file.  PatNicolette BangD  [facility] [place]

## 2020-03-28 NOTE — Progress Notes (Signed)
Bladder Scan Patient cannot void: 151 ml Performed CN:GFRE, lpn   Urological Symptom Review  Patient is experiencing the following symptoms: Frequent urination Hard to postpone urination Get up at night to urinate Leakage of urine Trouble starting stream Have to strain to urinate Weak stream Erection problems (male only)   Review of Systems  Gastrointestinal (upper)  : Negative for upper GI symptoms  Gastrointestinal (lower) : Diarrhea  Constitutional : Negative for symptoms  Skin: Negative for skin symptoms  Eyes: Negative for eye symptoms  Ear/Nose/Throat : Sore throat Sinus problems  Hematologic/Lymphatic: Negative for Hematologic/Lymphatic symptoms  Cardiovascular : Negative for cardiovascular symptoms  Respiratory : Negative for respiratory symptoms  Endocrine: Negative for endocrine symptoms  Musculoskeletal: Back pain Joint pain  Neurological: Headaches  Psychologic: Negative for psychiatric symptoms

## 2020-04-12 DIAGNOSIS — J069 Acute upper respiratory infection, unspecified: Secondary | ICD-10-CM | POA: Diagnosis not present

## 2020-04-12 DIAGNOSIS — Z20828 Contact with and (suspected) exposure to other viral communicable diseases: Secondary | ICD-10-CM | POA: Diagnosis not present

## 2020-05-11 ENCOUNTER — Ambulatory Visit (INDEPENDENT_AMBULATORY_CARE_PROVIDER_SITE_OTHER): Payer: Medicare Other | Admitting: Urology

## 2020-05-11 ENCOUNTER — Other Ambulatory Visit: Payer: Self-pay

## 2020-05-11 DIAGNOSIS — N401 Enlarged prostate with lower urinary tract symptoms: Secondary | ICD-10-CM

## 2020-05-11 DIAGNOSIS — R351 Nocturia: Secondary | ICD-10-CM

## 2020-05-11 DIAGNOSIS — N138 Other obstructive and reflux uropathy: Secondary | ICD-10-CM

## 2020-05-11 LAB — BLADDER SCAN AMB NON-IMAGING: Scan Result: 96

## 2020-05-11 MED ORDER — SILODOSIN 8 MG PO CAPS: 8.0000 mg | ORAL_CAPSULE | Freq: Every day | ORAL | 11 refills | Status: DC

## 2020-05-24 ENCOUNTER — Encounter: Payer: Self-pay | Admitting: Urology

## 2020-05-24 NOTE — Patient Instructions (Signed)

## 2020-05-24 NOTE — Progress Notes (Signed)
05/11/2020 8:47 AM   David Mooney 1940/02/02 [id number]  Referring provider: Curlene Labrum, MD [address] [place],  [place] [address]  Patient location: home Physician location: office I connected with  David Mooney on 05/24/20 by a video enabled telemedicine application and verified that I am speaking with the correct person using two identifiers.   I discussed the limitations of evaluation and management by telemedicine. The patient expressed understanding and agreed to proceed.   Followup BPH and nocturia  HPI: David Mooney is a 80yo here for followup for BPH with nocturia. He has tried both flomax and uroxatral which has failed to impriove his nocturia. Nocturia 3-5x. Urine stream is fair. He continues to have urinary urgency, frequency every 1-2 hours, hesitancy and starting/stopping of his stream. No dysuria   PMH: Past Medical History:  Diagnosis Date  . Cataract   . GERD (gastroesophageal reflux disease)   . Glaucoma   . Hernia of unspecified site of abdominal cavity without mention of obstruction or gangrene   . History of gastric ulcer   . Ulcerative colitis     Surgical History: Past Surgical History:  Procedure Laterality Date  . BIOPSY  12/04/2018   Procedure: BIOPSY;  Surgeon: Rogene Houston, MD;  [facility];  Service: Endoscopy;;  cecum  . COLONOSCOPY    . COLONOSCOPY N/A 08/27/2012   Procedure: COLONOSCOPY;  Surgeon: Rogene Houston, MD;  [facility];  Service: Endoscopy;  Laterality: N/A;  1200-moved to 13:15 Ann to notify pt  . COLONOSCOPY N/A 12/04/2018   Procedure: COLONOSCOPY;  Surgeon: Rogene Houston, MD;  [facility];  Service: Endoscopy;  Laterality: N/A;  155pm  . HERNIA REPAIR    . MOLE REMOVAL     From head , chest  . UPPER GASTROINTESTINAL ENDOSCOPY      Home Medications:  Allergies as of 05/11/2020   No Known Allergies     Medication List       Accurate as of May 11, 2020 11:59 PM. If  you have any questions, ask your nurse or doctor.        acetaminophen 500 MG tablet Commonly known as: TYLENOL Take 500 mg by mouth every 6 (six) hours as needed for headache.   acetaminophen 650 MG CR tablet Commonly known as: TYLENOL Take 650 mg by mouth every 8 (eight) hours as needed for pain.   alfuzosin 10 MG 24 hr tablet Commonly known as: UROXATRAL Take 1 tablet (10 mg total) by mouth at bedtime.   ALIGN PO Take 1 tablet by mouth daily.   bimatoprost 0.01 % Soln Commonly known as: LUMIGAN Place 1 drop into both eyes at bedtime.   cholecalciferol 25 MCG (1000 UNIT) tablet Commonly known as: VITAMIN D3 Take 1,000 Units by mouth daily.   esomeprazole 40 MG capsule Commonly known as: NEXIUM Take 40 mg by mouth daily before breakfast.   fluticasone 50 MCG/ACT nasal spray Commonly known as: FLONASE Place 2 sprays into both nostrils daily.   guaiFENesin 600 MG 12 hr tablet Commonly known as: MUCINEX Take 600 mg by mouth daily. expectorant   magnesium oxide 400 MG tablet Commonly known as: MAG-OX Take 400 mg by mouth daily.   mesalamine 1.2 g EC tablet Commonly known as: LIALDA TAKE 2 TABLETS BY MOUTH TWICE DAILY   Multiple Vitamins tablet Take 1 tablet by mouth daily.   psyllium 58.6 % packet Commonly known as: METAMUCIL Take 1  packet by mouth daily.   silodosin 8 MG Caps capsule Commonly known as: RAPAFLO Take 1 capsule (8 mg total) by mouth at bedtime. Started by: Nicolette Bang, MD   tamsulosin 0.4 MG Caps capsule Commonly known as: FLOMAX Take 0.4 mg by mouth daily.       Allergies: No Known Allergies  Family History: Family History  Problem Relation Age of Onset  . Arrhythmia Mother   . Anuerysm Father   . Hypertension Father   . Diabetes Father   . Healthy Sister   . Healthy Sister   . Irritable bowel syndrome Daughter   . Thyroid disease Daughter     Social History:  reports that he has never smoked. He has never used  smokeless tobacco. He reports that he does not drink alcohol and does not use drugs.  ROS: All other review of systems were reviewed and are negative except what is noted above in HPI  Physical Exam: There were no vitals taken for this visit.  Constitutional:  Alert and oriented, No acute distress. HEENT:  AT, moist mucus membranes.  Trachea midline, no masses. Cardiovascular: No clubbing, cyanosis, or edema. Respiratory: Normal respiratory effort, no increased work of breathing. GI: Abdomen is soft, nontender, nondistended, no abdominal masses GU: No CVA tenderness.  Lymph: No cervical or inguinal lymphadenopathy. Skin: No rashes, bruises or suspicious lesions. Neurologic: Grossly intact, no focal deficits, moving all 4 extremities. Psychiatric: Normal mood and affect.  Laboratory Data: Lab Results  Component Value Date   WBC 6.4 09/16/2018   HGB 12.9 (L) 09/16/2018   HCT 38.6 09/16/2018   MCV 90.8 09/16/2018   PLT 172 09/16/2018    Lab Results  Component Value Date   CREATININE 1.16 09/16/2018    No results found for: PSA  No results found for: TESTOSTERONE  No results found for: HGBA1C  Urinalysis    Component Value Date/Time   COLORURINE YELLOW 01/02/2018 [place] 01/02/2018 1433   LABSPEC 1.011 01/02/2018 1433   PHURINE 6.0 01/02/2018 1433   GLUCOSEU NEGATIVE 01/02/2018 1433   HGBUR SMALL (A) 01/02/2018 1433   BILIRUBINUR NEGATIVE 01/02/2018 1433   KETONESUR 5 (A) 01/02/2018 1433   PROTEINUR NEGATIVE 01/02/2018 1433   NITRITE NEGATIVE 01/02/2018 1433   LEUKOCYTESUR NEGATIVE 01/02/2018 1433    Lab Results  Component Value Date   BACTERIA NONE SEEN 01/02/2018    Pertinent Imaging:  No results found for this or any previous visit.  No results found for this or any previous visit.  No results found for this or any previous visit.  No results found for this or any previous visit.  No results found for this or any previous  visit.  No results found for this or any previous visit.  No results found for this or any previous visit.  No results found for this or any previous visit.   Assessment & Plan:    1. Nocturia -We will start rapaflo 65m daily -RTC 3 months - Bladder Scan (Post Void Residual) in office   No follow-ups on file.  PNicolette Bang MD  [facility] [place]

## 2020-07-01 DIAGNOSIS — E782 Mixed hyperlipidemia: Secondary | ICD-10-CM | POA: Diagnosis not present

## 2020-07-01 DIAGNOSIS — N183 Chronic kidney disease, stage 3 unspecified: Secondary | ICD-10-CM | POA: Diagnosis not present

## 2020-07-01 DIAGNOSIS — E7849 Other hyperlipidemia: Secondary | ICD-10-CM | POA: Diagnosis not present

## 2020-07-01 DIAGNOSIS — K219 Gastro-esophageal reflux disease without esophagitis: Secondary | ICD-10-CM | POA: Diagnosis not present

## 2020-07-01 DIAGNOSIS — Z1329 Encounter for screening for other suspected endocrine disorder: Secondary | ICD-10-CM | POA: Diagnosis not present

## 2020-07-07 DIAGNOSIS — H35371 Puckering of macula, right eye: Secondary | ICD-10-CM | POA: Diagnosis not present

## 2020-07-07 DIAGNOSIS — H35363 Drusen (degenerative) of macula, bilateral: Secondary | ICD-10-CM | POA: Diagnosis not present

## 2020-07-07 DIAGNOSIS — H401131 Primary open-angle glaucoma, bilateral, mild stage: Secondary | ICD-10-CM | POA: Diagnosis not present

## 2020-07-07 DIAGNOSIS — H35033 Hypertensive retinopathy, bilateral: Secondary | ICD-10-CM | POA: Diagnosis not present

## 2020-07-12 DIAGNOSIS — E7849 Other hyperlipidemia: Secondary | ICD-10-CM | POA: Diagnosis not present

## 2020-07-12 DIAGNOSIS — K5180 Other ulcerative colitis without complications: Secondary | ICD-10-CM | POA: Diagnosis not present

## 2020-07-12 DIAGNOSIS — G4733 Obstructive sleep apnea (adult) (pediatric): Secondary | ICD-10-CM | POA: Diagnosis not present

## 2020-07-12 DIAGNOSIS — N182 Chronic kidney disease, stage 2 (mild): Secondary | ICD-10-CM | POA: Diagnosis not present

## 2020-08-05 ENCOUNTER — Ambulatory Visit
Admission: EM | Admit: 2020-08-05 | Discharge: 2020-08-05 | Disposition: A | Discharge status: Home / Self Care | Payer: Medicare Other | Attending: Emergency Medicine | Admitting: Emergency Medicine

## 2020-08-05 ENCOUNTER — Encounter: Payer: Self-pay | Admitting: Emergency Medicine

## 2020-08-05 DIAGNOSIS — Z20822 Contact with and (suspected) exposure to covid-19: Secondary | ICD-10-CM

## 2020-08-05 NOTE — ED Triage Notes (Signed)
No symptoms wants covid test

## 2020-08-07 LAB — NOVEL CORONAVIRUS, NAA: SARS-CoV-2, NAA: NOT DETECTED

## 2020-08-07 LAB — SARS-COV-2, NAA 2 DAY TAT

## 2020-08-10 ENCOUNTER — Encounter: Payer: Self-pay | Admitting: Urology

## 2020-08-10 ENCOUNTER — Other Ambulatory Visit: Payer: Self-pay

## 2020-08-10 ENCOUNTER — Ambulatory Visit: Payer: Medicare Other | Admitting: Urology

## 2020-08-10 VITALS — BP 96/64 | HR 89

## 2020-08-10 DIAGNOSIS — N138 Other obstructive and reflux uropathy: Secondary | ICD-10-CM | POA: Diagnosis not present

## 2020-08-10 DIAGNOSIS — N401 Enlarged prostate with lower urinary tract symptoms: Secondary | ICD-10-CM | POA: Diagnosis not present

## 2020-08-10 DIAGNOSIS — R3915 Urgency of urination: Secondary | ICD-10-CM | POA: Diagnosis not present

## 2020-08-10 DIAGNOSIS — R351 Nocturia: Secondary | ICD-10-CM | POA: Diagnosis not present

## 2020-08-10 MED ORDER — ALFUZOSIN HCL ER 10 MG PO TB24: 10.0000 mg | ORAL_TABLET | Freq: Every day | ORAL | 11 refills | Status: DC

## 2020-08-10 MED ORDER — FINASTERIDE 5 MG PO TABS: 5.0000 mg | ORAL_TABLET | Freq: Every day | ORAL | 3 refills | Status: DC

## 2020-08-10 MED ORDER — TAMSULOSIN HCL 0.4 MG PO CAPS: 0.4000 mg | ORAL_CAPSULE | Freq: Every day | ORAL | 11 refills | Status: DC

## 2020-08-10 NOTE — Progress Notes (Signed)
post void residual=140  Urological Symptom Review  Patient is experiencing the following symptoms: Frequent urination Get up at night to urinate Leakage of urine Stream starts and stops Weak stream Erection problems (male only)   Review of Systems  Gastrointestinal (upper)  : Negative for upper GI symptoms  Gastrointestinal (lower) : Negative for lower GI symptoms  Constitutional : Night Sweats  Skin: Negative for skin symptoms  Eyes: Negative for eye symptoms  Ear/Nose/Throat : Sinus problems  Hematologic/Lymphatic: Negative for Hematologic/Lymphatic symptoms  Cardiovascular : Negative for cardiovascular symptoms  Respiratory : Cough  Endocrine: Negative for endocrine symptoms  Musculoskeletal: Back pain  Neurological: Headaches  Psychologic: Negative for psychiatric symptoms

## 2020-08-10 NOTE — Patient Instructions (Signed)
Benign Prostatic Hyperplasia  Benign prostatic hyperplasia (BPH) is an enlarged prostate gland that is caused by the normal aging process and not by cancer. The prostate is a walnut-sized gland that is involved in the production of semen. It is located in front of the rectum and below the bladder. The bladder stores urine and the urethra is the tube that carries the urine out of the body. The prostate may get bigger asa man gets older. An enlarged prostate can press on the urethra. This can make it harder to pass urine. The build-up of urine in the bladder can cause infection. Back pressure and infection may progress to bladder damage and kidney (renal) failure. What are the causes? This condition is part of a normal aging process. However, not all men develop problems from this condition. If the prostate enlarges away from the urethra, urine flow will not be blocked. If it enlarges toward the urethra andcompresses it, there will be problems passing urine. What increases the risk? This condition is more likely to develop in men over the age of 34 years. What are the signs or symptoms? Symptoms of this condition include: Getting up often during the night to urinate. Needing to urinate frequently during the day. Difficulty starting urine flow. Decrease in size and strength of your urine stream. Leaking (dribbling) after urinating. Inability to pass urine. This needs immediate treatment. Inability to completely empty your bladder. Pain when you pass urine. This is more common if there is also an infection. Urinary tract infection (UTI). How is this diagnosed? This condition is diagnosed based on your medical history, a physical exam, and your symptoms. Tests will also be done, such as: A post-void bladder scan. This measures any amount of urine that may remain in your bladder after you finish urinating. A digital rectal exam. In a rectal exam, your health care provider checks your prostate by  putting a lubricated, gloved finger into your rectum to feel the back of your prostate gland. This exam detects the size of your gland and any abnormal lumps or growths. An exam of your urine (urinalysis). A prostate specific antigen (PSA) screening. This is a blood test used to screen for prostate cancer. An ultrasound. This test uses sound waves to electronically produce a picture of your prostate gland. Your health care provider may refer you to a specialist in kidney and prostate diseases (urologist). How is this treated? Once symptoms begin, your health care provider will monitor your condition (active surveillance or watchful waiting). Treatment for this condition will depend on the severity of your condition. Treatment may include: Observation and yearly exams. This may be the only treatment needed if your condition and symptoms are mild. Medicines to relieve your symptoms, including: Medicines to shrink the prostate. Medicines to relax the muscle of the prostate. Surgery in severe cases. Surgery may include: Prostatectomy. In this procedure, the prostate tissue is removed completely through an open incision or with a laparoscope or robotics. Transurethral resection of the prostate (TURP). In this procedure, a tool is inserted through the opening at the tip of the penis (urethra). It is used to cut away tissue of the inner core of the prostate. The pieces are removed through the same opening of the penis. This removes the blockage. Transurethral incision (TUIP). In this procedure, small cuts are made in the prostate. This lessens the prostate's pressure on the urethra. Transurethral microwave thermotherapy (TUMT). This procedure uses microwaves to create heat. The heat destroys and removes a small  amount of prostate tissue. Transurethral needle ablation (TUNA). This procedure uses radio frequencies to destroy and remove a small amount of prostate tissue. Interstitial laser coagulation ([place]).  This procedure uses a laser to destroy and remove a small amount of prostate tissue. Transurethral electrovaporization (TUVP). This procedure uses electrodes to destroy and remove a small amount of prostate tissue. Prostatic urethral lift. This procedure inserts an implant to push the lobes of the prostate away from the urethra. Follow these instructions at home: Take over-the-counter and prescription medicines only as told by your health care provider. Monitor your symptoms for any changes. Contact your health care provider with any changes. Avoid drinking large amounts of liquid before going to bed or out in public. Avoid or reduce how much caffeine or alcohol you drink. Give yourself time when you urinate. Keep all follow-up visits as told by your health care provider. This is important. Contact a health care provider if: You have unexplained back pain. Your symptoms do not get better with treatment. You develop side effects from the medicine you are taking. Your urine becomes very dark or has a bad smell. Your lower abdomen becomes distended and you have trouble passing your urine. Get help right away if: You have a fever or chills. You suddenly cannot urinate. You feel lightheaded, or very dizzy, or you faint. There are large amounts of blood or clots in the urine. Your urinary problems become hard to manage. You develop moderate to severe low back or flank pain. The flank is the side of your body between the ribs and the hip. These symptoms may represent a serious problem that is an emergency. Do not wait to see if the symptoms will go away. Get medical help right away. Call your local emergency services (911 in the [place] Do not drive yourself to the hospital. Summary Benign prostatic hyperplasia (BPH) is an enlarged prostate that is caused by the normal aging process and not by cancer. An enlarged prostate can press on the urethra. This can make it hard to pass urine. This  condition is part of a normal aging process and is more likely to develop in men over the age of 29 years. Get help right away if you suddenly cannot urinate. This information is not intended to replace advice given to you by your health care provider. Make sure you discuss any questions you have with your healthcare provider. Document Revised: 09/10/2019 Document Reviewed: 09/10/2019 Elsevier Patient Education  [place].

## 2020-08-10 NOTE — Progress Notes (Signed)
08/10/2020 11:03 AM   David Mooney Oct 24, 1940 [id number]  Referring provider: Curlene Labrum, MD [place],  [place] [address]  Followup BPH  HPI: [name] is a 80yo here for followup for BPH with nocturia. Last visit he was switch to rapaflo which did improve his LUTS but he ran out of the medication and went back on uroxatral. IPSS 16 QOL 3. He is currently on flomax 0.24m in the morning and uroxatral 125mqhs. Nocturia is stable at 2x. Urine stream is strong. No rinary hesitancy. He does have intermittent urgency and frequency every 2 hours. No other complaints today.    PMH: Past Medical History:  Diagnosis Date   Cataract    GERD (gastroesophageal reflux disease)    Glaucoma    Hernia of unspecified site of abdominal cavity without mention of obstruction or gangrene    History of gastric ulcer    Ulcerative colitis     Surgical History: Past Surgical History:  Procedure Laterality Date   BIOPSY  12/04/2018   Procedure: BIOPSY;  Surgeon: ReRogene HoustonMD;  [facility];  Service: Endoscopy;;  cecum   COLONOSCOPY     COLONOSCOPY N/A 08/27/2012   Procedure: COLONOSCOPY;  Surgeon: NaRogene HoustonMD;  [facility];  Service: Endoscopy;  Laterality: N/A;  1200-moved to 13:15 Ann to notify pt   COLONOSCOPY N/A 12/04/2018   Procedure: COLONOSCOPY;  Surgeon: ReRogene HoustonMD;  [facility];  Service: Endoscopy;  Laterality: N/A;  155pm   HERNIA REPAIR     MOLE REMOVAL     From head , chest   UPPER GASTROINTESTINAL ENDOSCOPY      Home Medications:  Allergies as of 08/10/2020   No Known Allergies      Medication List        Accurate as of August 10, 2020 11:03 AM. If you have any questions, ask your nurse or doctor.          acetaminophen 500 MG tablet Commonly known as: TYLENOL Take 500 mg by mouth every 6 (six) hours as needed for headache.   acetaminophen 650 MG CR tablet Commonly known as: TYLENOL Take  650 mg by mouth every 8 (eight) hours as needed for pain.   alfuzosin 10 MG 24 hr tablet Commonly known as: UROXATRAL Take 1 tablet (10 mg total) by mouth at bedtime.   ALIGN PO Take 1 tablet by mouth daily.   bimatoprost 0.01 % Soln Commonly known as: LUMIGAN Place 1 drop into both eyes at bedtime.   cholecalciferol 25 MCG (1000 UNIT) tablet Commonly known as: VITAMIN D3 Take 1,000 Units by mouth daily.   esomeprazole 40 MG capsule Commonly known as: NEXIUM Take 40 mg by mouth daily before breakfast.   fluticasone 50 MCG/ACT nasal spray Commonly known as: FLONASE Place 2 sprays into both nostrils daily.   guaiFENesin 600 MG 12 hr tablet Commonly known as: MUCINEX Take 600 mg by mouth daily. expectorant   magnesium oxide 400 MG tablet Commonly known as: MAG-OX Take 400 mg by mouth daily.   mesalamine 1.2 g EC tablet Commonly known as: LIALDA TAKE 2 TABLETS BY MOUTH TWICE DAILY   Multiple Vitamins tablet Take 1 tablet by mouth daily.   psyllium 58.6 % packet Commonly known as: METAMUCIL Take 1 packet by mouth daily.   silodosin 8 MG Caps capsule Commonly known as: RAPAFLO Take 1 capsule (8 mg total) by mouth at  bedtime.   tamsulosin 0.4 MG Caps capsule Commonly known as: FLOMAX Take 0.4 mg by mouth daily.        Allergies: No Known Allergies  Family History: Family History  Problem Relation Age of Onset   Arrhythmia Mother    Anuerysm Father    Hypertension Father    Diabetes Father    Healthy Sister    Healthy Sister    Irritable bowel syndrome Daughter    Thyroid disease Daughter     Social History:  reports that he has never smoked. He has never used smokeless tobacco. He reports that he does not drink alcohol and does not use drugs.  ROS: All other review of systems were reviewed and are negative except what is noted above in HPI  Physical Exam: BP 96/64   Pulse 89   Constitutional:  Alert and oriented, No acute distress. HEENT: [place]  AT, moist mucus membranes.  Trachea midline, no masses. Cardiovascular: No clubbing, cyanosis, or edema. Respiratory: Normal respiratory effort, no increased work of breathing. GI: Abdomen is soft, nontender, nondistended, no abdominal masses GU: No CVA tenderness.  Lymph: No cervical or inguinal lymphadenopathy. Skin: No rashes, bruises or suspicious lesions. Neurologic: Grossly intact, no focal deficits, moving all 4 extremities. Psychiatric: Normal mood and affect.  Laboratory Data: Lab Results  Component Value Date   WBC 6.4 09/16/2018   HGB 12.9 (L) 09/16/2018   HCT 38.6 09/16/2018   MCV 90.8 09/16/2018   PLT 172 09/16/2018    Lab Results  Component Value Date   CREATININE 1.16 09/16/2018    No results found for: PSA  No results found for: TESTOSTERONE  No results found for: HGBA1C  Urinalysis    Component Value Date/Time   COLORURINE YELLOW 01/02/2018 [place] 01/02/2018 1433   LABSPEC 1.011 01/02/2018 1433   PHURINE 6.0 01/02/2018 1433   GLUCOSEU NEGATIVE 01/02/2018 1433   HGBUR SMALL (A) 01/02/2018 1433   BILIRUBINUR NEGATIVE 01/02/2018 1433   KETONESUR 5 (A) 01/02/2018 1433   PROTEINUR NEGATIVE 01/02/2018 1433   NITRITE NEGATIVE 01/02/2018 1433   LEUKOCYTESUR NEGATIVE 01/02/2018 1433    Lab Results  Component Value Date   BACTERIA NONE SEEN 01/02/2018    Pertinent Imaging:  No results found for this or any previous visit.  No results found for this or any previous visit.  No results found for this or any previous visit.  No results found for this or any previous visit.  No results found for this or any previous visit.  No results found for this or any previous visit.  No results found for this or any previous visit.  No results found for this or any previous visit.   Assessment & Plan:    1. Benign prostatic hyperplasia with urinary obstruction -Continue uroxatral 26m qhs and flomax 0.427min AM. We will add  finasteride 88m89maily - BLADDER SCAN AMB NON-IMAGING - Urinalysis, Routine w reflex microscopic  2. Urgency of urination -Continue uroxatral 26m60ms and Flomax 0.4mg 56mAM   3. Nocturia -FLuid management prior to going to bed and stop caffeine after 3pm   No follow-ups on file.  PatriNicolette Bang [facility] [place]

## 2021-01-11 DIAGNOSIS — E782 Mixed hyperlipidemia: Secondary | ICD-10-CM | POA: Diagnosis not present

## 2021-01-11 DIAGNOSIS — Z0001 Encounter for general adult medical examination with abnormal findings: Secondary | ICD-10-CM | POA: Diagnosis not present

## 2021-01-11 DIAGNOSIS — K219 Gastro-esophageal reflux disease without esophagitis: Secondary | ICD-10-CM | POA: Diagnosis not present

## 2021-01-11 DIAGNOSIS — N183 Chronic kidney disease, stage 3 unspecified: Secondary | ICD-10-CM | POA: Diagnosis not present

## 2021-01-17 DIAGNOSIS — Z0001 Encounter for general adult medical examination with abnormal findings: Secondary | ICD-10-CM | POA: Diagnosis not present

## 2021-01-17 DIAGNOSIS — K5180 Other ulcerative colitis without complications: Secondary | ICD-10-CM | POA: Diagnosis not present

## 2021-01-17 DIAGNOSIS — R7301 Impaired fasting glucose: Secondary | ICD-10-CM | POA: Diagnosis not present

## 2021-01-17 DIAGNOSIS — E7849 Other hyperlipidemia: Secondary | ICD-10-CM | POA: Diagnosis not present

## 2021-01-26 DIAGNOSIS — H43391 Other vitreous opacities, right eye: Secondary | ICD-10-CM | POA: Diagnosis not present

## 2021-01-26 DIAGNOSIS — H401131 Primary open-angle glaucoma, bilateral, mild stage: Secondary | ICD-10-CM | POA: Diagnosis not present

## 2021-02-10 ENCOUNTER — Encounter: Payer: Self-pay | Admitting: Urology

## 2021-02-10 ENCOUNTER — Other Ambulatory Visit: Payer: Self-pay

## 2021-02-10 ENCOUNTER — Ambulatory Visit: Payer: Medicare Other | Admitting: Urology

## 2021-02-10 VITALS — BP 95/62 | HR 111 | Wt 188.0 lb

## 2021-02-10 DIAGNOSIS — N401 Enlarged prostate with lower urinary tract symptoms: Secondary | ICD-10-CM | POA: Diagnosis not present

## 2021-02-10 DIAGNOSIS — N138 Other obstructive and reflux uropathy: Secondary | ICD-10-CM | POA: Diagnosis not present

## 2021-02-10 DIAGNOSIS — R3915 Urgency of urination: Secondary | ICD-10-CM | POA: Diagnosis not present

## 2021-02-10 DIAGNOSIS — R351 Nocturia: Secondary | ICD-10-CM | POA: Diagnosis not present

## 2021-02-10 LAB — URINALYSIS, ROUTINE W REFLEX MICROSCOPIC
Bilirubin, UA: NEGATIVE
Glucose, UA: NEGATIVE
Nitrite, UA: NEGATIVE
Protein,UA: NEGATIVE
RBC, UA: NEGATIVE
Specific Gravity, UA: 1.02 (ref 1.005–1.030)
Urobilinogen, Ur: 0.2 mg/dL (ref 0.2–1.0)
pH, UA: 5.5 (ref 5.0–7.5)

## 2021-02-10 LAB — MICROSCOPIC EXAMINATION
RBC, Urine: NONE SEEN /hpf (ref 0–2)
Renal Epithel, UA: NONE SEEN /hpf

## 2021-02-10 LAB — BLADDER SCAN AMB NON-IMAGING

## 2021-02-10 MED ORDER — FINASTERIDE 5 MG PO TABS: 5.0000 mg | ORAL_TABLET | Freq: Every day | ORAL | 3 refills | Status: DC

## 2021-02-10 MED ORDER — ALFUZOSIN HCL ER 10 MG PO TB24: 10.0000 mg | ORAL_TABLET | Freq: Every day | ORAL | 11 refills | Status: DC

## 2021-02-10 MED ORDER — TAMSULOSIN HCL 0.4 MG PO CAPS: 0.4000 mg | ORAL_CAPSULE | Freq: Every day | ORAL | 11 refills | Status: DC

## 2021-02-10 NOTE — Progress Notes (Signed)

## 2021-02-10 NOTE — Patient Instructions (Signed)

## 2021-02-10 NOTE — Progress Notes (Signed)
02/10/2021 10:38 AM   David Mooney 1940-02-14 [id number]  Referring provider: Curlene Labrum, MD [place],  [place] [address]  Follow BPH   HPI: David Mooney is a 81yo here for followup for BPH with nocturia. IPSS 11 QOL 2. He is on flomax 0.67m AM, finasteride 519mAM, and uroxatral 1046mhs. Nocturia 1-2x. Urine stream strong. No urinary hesitancy. No straining to urinate. No other complaints today   PMH: Past Medical History:  Diagnosis Date   Cataract    GERD (gastroesophageal reflux disease)    Glaucoma    Hernia of unspecified site of abdominal cavity without mention of obstruction or gangrene    History of gastric ulcer    Ulcerative colitis     Surgical History: Past Surgical History:  Procedure Laterality Date   BIOPSY  12/04/2018   Procedure: BIOPSY;  Surgeon: RehRogene HoustonD;  [facility];  Service: Endoscopy;;  cecum   COLONOSCOPY     COLONOSCOPY N/A 08/27/2012   Procedure: COLONOSCOPY;  Surgeon: NajRogene HoustonD;  [facility];  Service: Endoscopy;  Laterality: N/A;  1200-moved to 13:15 Ann to notify pt   COLONOSCOPY N/A 12/04/2018   Procedure: COLONOSCOPY;  Surgeon: RehRogene HoustonD;  [facility];  Service: Endoscopy;  Laterality: N/A;  155pm   HERNIA REPAIR     MOLE REMOVAL     From head , chest   UPPER GASTROINTESTINAL ENDOSCOPY      Home Medications:  Allergies as of 02/10/2021   No Known Allergies      Medication List        Accurate as of February 10, 2021 10:38 AM. If you have any questions, ask your nurse or doctor.          acetaminophen 500 MG tablet Commonly known as: TYLENOL Take 500 mg by mouth every 6 (six) hours as needed for headache.   acetaminophen 650 MG CR tablet Commonly known as: TYLENOL Take 650 mg by mouth every 8 (eight) hours as needed for pain.   alfuzosin 10 MG 24 hr tablet Commonly known as: UROXATRAL Take 1 tablet (10 mg total) by mouth at bedtime.    ALIGN PO Take 1 tablet by mouth daily.   bimatoprost 0.01 % Soln Commonly known as: LUMIGAN Place 1 drop into both eyes at bedtime.   cholecalciferol 25 MCG (1000 UNIT) tablet Commonly known as: VITAMIN D3 Take 1,000 Units by mouth daily.   esomeprazole 40 MG capsule Commonly known as: NEXIUM Take 40 mg by mouth daily before breakfast.   finasteride 5 MG tablet Commonly known as: PROSCAR Take 1 tablet (5 mg total) by mouth daily.   fluticasone 50 MCG/ACT nasal spray Commonly known as: FLONASE Place 2 sprays into both nostrils daily.   guaiFENesin 600 MG 12 hr tablet Commonly known as: MUCINEX Take 600 mg by mouth daily. expectorant   magnesium oxide 400 MG tablet Commonly known as: MAG-OX Take 400 mg by mouth daily.   mesalamine 1.2 g EC tablet Commonly known as: LIALDA TAKE 2 TABLETS BY MOUTH TWICE DAILY   Multiple Vitamins tablet Take 1 tablet by mouth daily.   psyllium 58.6 % packet Commonly known as: METAMUCIL Take 1 packet by mouth daily.   silodosin 8 MG Caps capsule Commonly known as: RAPAFLO Take 1 capsule (8 mg total) by mouth at bedtime.   tamsulosin 0.4 MG Caps capsule Commonly known as: FLOMAX Take 1 capsule (  0.4 mg total) by mouth daily.        Allergies: No Known Allergies  Family History: Family History  Problem Relation Age of Onset   Arrhythmia Mother    Anuerysm Father    Hypertension Father    Diabetes Father    Healthy Sister    Healthy Sister    Irritable bowel syndrome Daughter    Thyroid disease Daughter     Social History:  reports that he has never smoked. He has never used smokeless tobacco. He reports that he does not drink alcohol and does not use drugs.  ROS: All other review of systems were reviewed and are negative except what is noted above in HPI  Physical Exam: BP 95/62    Pulse (!) 111    Wt 188 lb (85.3 kg)    BMI 30.34 kg/m   Constitutional:  Alert and oriented, No acute distress. HEENT: [place] AT, moist  mucus membranes.  Trachea midline, no masses. Cardiovascular: No clubbing, cyanosis, or edema. Respiratory: Normal respiratory effort, no increased work of breathing. GI: Abdomen is soft, nontender, nondistended, no abdominal masses GU: No CVA tenderness.  Lymph: No cervical or inguinal lymphadenopathy. Skin: No rashes, bruises or suspicious lesions. Neurologic: Grossly intact, no focal deficits, moving all 4 extremities. Psychiatric: Normal mood and affect.  Laboratory Data: Lab Results  Component Value Date   WBC 6.4 09/16/2018   HGB 12.9 (L) 09/16/2018   HCT 38.6 09/16/2018   MCV 90.8 09/16/2018   PLT 172 09/16/2018    Lab Results  Component Value Date   CREATININE 1.16 09/16/2018    No results found for: PSA  No results found for: TESTOSTERONE  No results found for: HGBA1C  Urinalysis    Component Value Date/Time   COLORURINE YELLOW 01/02/2018 [place] 01/02/2018 1433   LABSPEC 1.011 01/02/2018 1433   PHURINE 6.0 01/02/2018 1433   GLUCOSEU NEGATIVE 01/02/2018 1433   HGBUR SMALL (A) 01/02/2018 1433   BILIRUBINUR NEGATIVE 01/02/2018 1433   KETONESUR 5 (A) 01/02/2018 1433   PROTEINUR NEGATIVE 01/02/2018 1433   NITRITE NEGATIVE 01/02/2018 1433   LEUKOCYTESUR NEGATIVE 01/02/2018 1433    Lab Results  Component Value Date   BACTERIA NONE SEEN 01/02/2018    Pertinent Imaging:  No results found for this or any previous visit.  No results found for this or any previous visit.  No results found for this or any previous visit.  No results found for this or any previous visit.  No results found for this or any previous visit.  No results found for this or any previous visit.  No results found for this or any previous visit.  No results found for this or any previous visit.   Assessment & Plan:    1. Urgency of urination -Continue uroxatral 53m qhs and flomax 0.445min AM - Urinalysis, Routine w reflex microscopic - BLADDER SCAN AMB  NON-IMAGING  2. Benign prostatic hyperplasia with urinary obstruction -Continue finasteride  3. Nocturia -Continue uroxatral 1040mflomax 0.4mg86md finasteride 5mg 49mly   No follow-ups on file.  PatriNicolette Bang [facility] [place]

## 2021-02-10 NOTE — Progress Notes (Signed)
post void residual=34m

## 2021-02-16 DIAGNOSIS — K219 Gastro-esophageal reflux disease without esophagitis: Secondary | ICD-10-CM | POA: Diagnosis not present

## 2021-02-16 DIAGNOSIS — D519 Vitamin B12 deficiency anemia, unspecified: Secondary | ICD-10-CM | POA: Diagnosis not present

## 2021-02-16 DIAGNOSIS — D649 Anemia, unspecified: Secondary | ICD-10-CM | POA: Diagnosis not present

## 2021-02-16 DIAGNOSIS — N183 Chronic kidney disease, stage 3 unspecified: Secondary | ICD-10-CM | POA: Diagnosis not present

## 2021-03-09 ENCOUNTER — Other Ambulatory Visit (INDEPENDENT_AMBULATORY_CARE_PROVIDER_SITE_OTHER): Payer: Self-pay | Admitting: Internal Medicine

## 2021-03-09 NOTE — Telephone Encounter (Signed)
12/01/2019 last seen by Dr. Laural Golden.

## 2021-06-05 ENCOUNTER — Other Ambulatory Visit: Payer: Self-pay | Admitting: Urology

## 2021-06-09 DIAGNOSIS — K5180 Other ulcerative colitis without complications: Secondary | ICD-10-CM | POA: Diagnosis not present

## 2021-06-09 DIAGNOSIS — R197 Diarrhea, unspecified: Secondary | ICD-10-CM | POA: Diagnosis not present

## 2021-06-09 DIAGNOSIS — R55 Syncope and collapse: Secondary | ICD-10-CM | POA: Diagnosis not present

## 2021-06-09 DIAGNOSIS — R0982 Postnasal drip: Secondary | ICD-10-CM | POA: Diagnosis not present

## 2021-07-24 DIAGNOSIS — R7301 Impaired fasting glucose: Secondary | ICD-10-CM | POA: Diagnosis not present

## 2021-07-24 DIAGNOSIS — E7849 Other hyperlipidemia: Secondary | ICD-10-CM | POA: Diagnosis not present

## 2021-07-24 DIAGNOSIS — K219 Gastro-esophageal reflux disease without esophagitis: Secondary | ICD-10-CM | POA: Diagnosis not present

## 2021-07-24 DIAGNOSIS — Z1329 Encounter for screening for other suspected endocrine disorder: Secondary | ICD-10-CM | POA: Diagnosis not present

## 2021-07-26 DIAGNOSIS — Z6830 Body mass index (BMI) 30.0-30.9, adult: Secondary | ICD-10-CM | POA: Diagnosis not present

## 2021-07-26 DIAGNOSIS — T679XXA Effect of heat and light, unspecified, initial encounter: Secondary | ICD-10-CM | POA: Diagnosis not present

## 2021-07-26 DIAGNOSIS — T678XXA Other effects of heat and light, initial encounter: Secondary | ICD-10-CM | POA: Diagnosis not present

## 2021-07-26 DIAGNOSIS — E860 Dehydration: Secondary | ICD-10-CM | POA: Diagnosis not present

## 2021-07-26 DIAGNOSIS — Z0001 Encounter for general adult medical examination with abnormal findings: Secondary | ICD-10-CM | POA: Diagnosis not present

## 2021-07-27 DIAGNOSIS — T679XXA Effect of heat and light, unspecified, initial encounter: Secondary | ICD-10-CM | POA: Diagnosis not present

## 2021-07-27 DIAGNOSIS — E860 Dehydration: Secondary | ICD-10-CM | POA: Diagnosis not present

## 2021-07-27 DIAGNOSIS — T678XXA Other effects of heat and light, initial encounter: Secondary | ICD-10-CM | POA: Diagnosis not present

## 2021-07-27 DIAGNOSIS — E7849 Other hyperlipidemia: Secondary | ICD-10-CM | POA: Diagnosis not present

## 2021-08-11 ENCOUNTER — Other Ambulatory Visit: Payer: Self-pay

## 2021-08-11 DIAGNOSIS — R3915 Urgency of urination: Secondary | ICD-10-CM

## 2021-08-11 DIAGNOSIS — R351 Nocturia: Secondary | ICD-10-CM

## 2021-08-11 DIAGNOSIS — N138 Other obstructive and reflux uropathy: Secondary | ICD-10-CM

## 2021-08-11 MED ORDER — FINASTERIDE 5 MG PO TABS: 5.0000 mg | ORAL_TABLET | Freq: Every day | ORAL | 3 refills | Status: DC

## 2021-08-11 NOTE — Progress Notes (Signed)
Rx refill only

## 2021-11-20 DIAGNOSIS — H43813 Vitreous degeneration, bilateral: Secondary | ICD-10-CM | POA: Diagnosis not present

## 2021-11-20 DIAGNOSIS — H35343 Macular cyst, hole, or pseudohole, bilateral: Secondary | ICD-10-CM | POA: Diagnosis not present

## 2021-11-20 DIAGNOSIS — H35373 Puckering of macula, bilateral: Secondary | ICD-10-CM | POA: Diagnosis not present

## 2021-11-20 DIAGNOSIS — H401131 Primary open-angle glaucoma, bilateral, mild stage: Secondary | ICD-10-CM | POA: Diagnosis not present

## 2021-12-03 ENCOUNTER — Encounter (INDEPENDENT_AMBULATORY_CARE_PROVIDER_SITE_OTHER): Payer: Self-pay | Admitting: Gastroenterology

## 2022-01-17 DIAGNOSIS — K219 Gastro-esophageal reflux disease without esophagitis: Secondary | ICD-10-CM | POA: Diagnosis not present

## 2022-01-17 DIAGNOSIS — R7301 Impaired fasting glucose: Secondary | ICD-10-CM | POA: Diagnosis not present

## 2022-01-17 DIAGNOSIS — N183 Chronic kidney disease, stage 3 unspecified: Secondary | ICD-10-CM | POA: Diagnosis not present

## 2022-01-17 DIAGNOSIS — Z1329 Encounter for screening for other suspected endocrine disorder: Secondary | ICD-10-CM | POA: Diagnosis not present

## 2022-01-17 DIAGNOSIS — E7849 Other hyperlipidemia: Secondary | ICD-10-CM | POA: Diagnosis not present

## 2022-01-19 DIAGNOSIS — K080 Exfoliation of teeth due to systemic causes: Secondary | ICD-10-CM | POA: Diagnosis not present

## 2022-01-23 DIAGNOSIS — G4733 Obstructive sleep apnea (adult) (pediatric): Secondary | ICD-10-CM | POA: Diagnosis not present

## 2022-01-23 DIAGNOSIS — K5180 Other ulcerative colitis without complications: Secondary | ICD-10-CM | POA: Diagnosis not present

## 2022-01-23 DIAGNOSIS — R7301 Impaired fasting glucose: Secondary | ICD-10-CM | POA: Diagnosis not present

## 2022-01-23 DIAGNOSIS — E7849 Other hyperlipidemia: Secondary | ICD-10-CM | POA: Diagnosis not present

## 2022-01-23 DIAGNOSIS — Z23 Encounter for immunization: Secondary | ICD-10-CM | POA: Diagnosis not present

## 2022-01-23 DIAGNOSIS — Z0001 Encounter for general adult medical examination with abnormal findings: Secondary | ICD-10-CM | POA: Diagnosis not present

## 2022-01-24 DIAGNOSIS — H35343 Macular cyst, hole, or pseudohole, bilateral: Secondary | ICD-10-CM | POA: Diagnosis not present

## 2022-01-24 DIAGNOSIS — H401131 Primary open-angle glaucoma, bilateral, mild stage: Secondary | ICD-10-CM | POA: Diagnosis not present

## 2022-01-25 DIAGNOSIS — J329 Chronic sinusitis, unspecified: Secondary | ICD-10-CM | POA: Diagnosis not present

## 2022-01-30 DIAGNOSIS — Z23 Encounter for immunization: Secondary | ICD-10-CM | POA: Diagnosis not present

## 2022-02-09 ENCOUNTER — Ambulatory Visit: Payer: Medicare Other | Admitting: Urology

## 2022-02-09 ENCOUNTER — Encounter: Payer: Self-pay | Admitting: Urology

## 2022-02-09 VITALS — BP 115/76 | HR 85 | Wt 188.0 lb

## 2022-02-09 DIAGNOSIS — N138 Other obstructive and reflux uropathy: Secondary | ICD-10-CM | POA: Diagnosis not present

## 2022-02-09 DIAGNOSIS — R351 Nocturia: Secondary | ICD-10-CM | POA: Diagnosis not present

## 2022-02-09 DIAGNOSIS — R3915 Urgency of urination: Secondary | ICD-10-CM

## 2022-02-09 DIAGNOSIS — N401 Enlarged prostate with lower urinary tract symptoms: Secondary | ICD-10-CM | POA: Diagnosis not present

## 2022-02-09 LAB — URINALYSIS, ROUTINE W REFLEX MICROSCOPIC
Bilirubin, UA: NEGATIVE
Glucose, UA: NEGATIVE
Nitrite, UA: NEGATIVE
Protein,UA: NEGATIVE
RBC, UA: NEGATIVE
Specific Gravity, UA: 1.02 (ref 1.005–1.030)
Urobilinogen, Ur: 0.2 mg/dL (ref 0.2–1.0)
pH, UA: 5.5 (ref 5.0–7.5)

## 2022-02-09 LAB — MICROSCOPIC EXAMINATION: Bacteria, UA: NONE SEEN

## 2022-02-09 LAB — BLADDER SCAN AMB NON-IMAGING: Scan Result: 172

## 2022-02-09 MED ORDER — SILODOSIN 8 MG PO CAPS: 8.0000 mg | ORAL_CAPSULE | Freq: Every day | ORAL | 11 refills | Status: DC

## 2022-02-09 MED ORDER — ALFUZOSIN HCL ER 10 MG PO TB24
10.0000 mg | ORAL_TABLET | Freq: Every day | ORAL | 11 refills | Status: DC
Start: 2022-02-09 — End: 2022-03-08

## 2022-02-09 MED ORDER — FINASTERIDE 5 MG PO TABS
5.0000 mg | ORAL_TABLET | Freq: Every day | ORAL | 3 refills | Status: DC
Start: 2022-02-09 — End: 2022-02-26

## 2022-02-09 MED ORDER — MIRABEGRON ER 25 MG PO TB24: 25.0000 mg | ORAL_TABLET | Freq: Every day | ORAL | 0 refills | Status: DC

## 2022-02-09 NOTE — Patient Instructions (Signed)

## 2022-02-09 NOTE — Progress Notes (Signed)
post void residual=162m

## 2022-02-09 NOTE — Progress Notes (Signed)
02/09/2022 11:31 AM   David Mooney 07-07-1940 [id number]  Referring provider: Curlene Labrum, MD [place],  [place] [address]  No chief complaint on file.   HPI: IPSS 12 QOL 3 on finasteride, uroxatral '10mg'$  in AM and silodosin. He has incontinence at night and wears 1-2 pads at night. His urine stream is fair. He has intermittent urinary urgency and frequency. He has a very remote history of having a cystoscopy for microhematuria.    PMH: Past Medical History:  Diagnosis Date   Cataract    GERD (gastroesophageal reflux disease)    Glaucoma    Hernia of unspecified site of abdominal cavity without mention of obstruction or gangrene    History of gastric ulcer    Ulcerative colitis     Surgical History: Past Surgical History:  Procedure Laterality Date   BIOPSY  12/04/2018   Procedure: BIOPSY;  Surgeon: Rogene Houston, MD;  [facility];  Service: Endoscopy;;  cecum   COLONOSCOPY     COLONOSCOPY N/A 08/27/2012   Procedure: COLONOSCOPY;  Surgeon: Rogene Houston, MD;  [facility]: [facility];  Service: Endoscopy;  Laterality: N/A;  1200-moved to 13:15 Ann to notify pt   COLONOSCOPY N/A 12/04/2018   Procedure: COLONOSCOPY;  Surgeon: Rogene Houston, MD;  Location: [facility];  Service: Endoscopy;  Laterality: N/A;  155pm   HERNIA REPAIR     MOLE REMOVAL     From head , chest   UPPER GASTROINTESTINAL ENDOSCOPY      Home Medications:  Allergies as of 02/09/2022   No Known Allergies      Medication List        Accurate as of February 09, 2022 11:31 AM. If you have any questions, ask your nurse or doctor.          acetaminophen 500 MG tablet Commonly known as: TYLENOL Take 500 mg by mouth every 6 (six) hours as needed for headache.   acetaminophen 650 MG CR tablet Commonly known as: TYLENOL Take 650 mg by mouth every 8 (eight) hours as needed for pain.   alfuzosin 10 MG 24 hr tablet Commonly known as: UROXATRAL Take 1 tablet (10  mg total) by mouth at bedtime.   ALIGN PO Take 1 tablet by mouth daily.   bimatoprost 0.01 % Soln Commonly known as: LUMIGAN Place 1 drop into both eyes at bedtime.   cholecalciferol 25 MCG (1000 UNIT) tablet Commonly known as: VITAMIN D3 Take 1,000 Units by mouth daily.   esomeprazole 40 MG capsule Commonly known as: NEXIUM Take 40 mg by mouth daily before breakfast.   finasteride 5 MG tablet Commonly known as: PROSCAR Take 1 tablet (5 mg total) by mouth daily.   fluticasone 50 MCG/ACT nasal spray Commonly known as: FLONASE Place 2 sprays into both nostrils daily.   guaiFENesin 600 MG 12 hr tablet Commonly known as: MUCINEX Take 600 mg by mouth daily. expectorant   magnesium oxide 400 MG tablet Commonly known as: MAG-OX Take 400 mg by mouth daily.   mesalamine 1.2 g EC tablet Commonly known as: LIALDA TAKE 2 TABLETS BY MOUTH TWICE DAILY   Multiple Vitamins tablet Take 1 tablet by mouth daily.   psyllium 58.6 % packet Commonly known as: METAMUCIL Take 1 packet by mouth daily.   silodosin 8 MG Caps capsule Commonly known as: RAPAFLO TAKE 1 CAPSULE(8 MG) BY MOUTH AT BEDTIME   tamsulosin 0.4 MG Caps capsule Commonly known as:  FLOMAX Take 1 capsule (0.4 mg total) by mouth daily.        Allergies: No Known Allergies  Family History: Family History  Problem Relation Age of Onset   Arrhythmia Mother    Anuerysm Father    Hypertension Father    Diabetes Father    Healthy Sister    Healthy Sister    Irritable bowel syndrome Daughter    Thyroid disease Daughter     Social History:  reports that he has never smoked. He has never used smokeless tobacco. He reports that he does not drink alcohol and does not use drugs.  ROS: All other review of systems were reviewed and are negative except what is noted above in HPI  Physical Exam: BP 115/76   Pulse 85   Wt 188 lb (85.3 kg)   BMI 30.34 kg/m   Constitutional:  Alert and oriented, No acute  distress. HEENT: [place] AT, moist mucus membranes.  Trachea midline, no masses. Cardiovascular: No clubbing, cyanosis, or edema. Respiratory: Normal respiratory effort, no increased work of breathing. GI: Abdomen is soft, nontender, nondistended, no abdominal masses GU: No CVA tenderness.  Lymph: No cervical or inguinal lymphadenopathy. Skin: No rashes, bruises or suspicious lesions. Neurologic: Grossly intact, no focal deficits, moving all 4 extremities. Psychiatric: Normal mood and affect.  Laboratory Data: Lab Results  Component Value Date   WBC 6.4 09/16/2018   HGB 12.9 (L) 09/16/2018   HCT 38.6 09/16/2018   MCV 90.8 09/16/2018   PLT 172 09/16/2018    Lab Results  Component Value Date   CREATININE 1.16 09/16/2018    No results found for: "PSA"  No results found for: "TESTOSTERONE"  No results found for: "HGBA1C"  Urinalysis    Component Value Date/Time   COLORURINE YELLOW 01/02/2018 1433   APPEARANCEUR Clear 02/10/2021 1322   LABSPEC 1.011 01/02/2018 1433   PHURINE 6.0 01/02/2018 1433   GLUCOSEU Negative 02/10/2021 1322   HGBUR SMALL (A) 01/02/2018 1433   BILIRUBINUR Negative 02/10/2021 1322   KETONESUR 5 (A) 01/02/2018 1433   PROTEINUR Negative 02/10/2021 1322   PROTEINUR NEGATIVE 01/02/2018 1433   NITRITE Negative 02/10/2021 1322   NITRITE NEGATIVE 01/02/2018 1433   LEUKOCYTESUR Trace (A) 02/10/2021 1322    Lab Results  Component Value Date   LABMICR See below: 02/10/2021   WBCUA 0-5 02/10/2021   LABEPIT 0-10 02/10/2021   MUCUS Present 02/10/2021   BACTERIA Few 02/10/2021    Pertinent Imaging: *** No results found for this or any previous visit.  No results found for this or any previous visit.  No results found for this or any previous visit.  No results found for this or any previous visit.  No results found for this or any previous visit.  No valid procedures specified. No results found for this or any previous visit.  No results found  for this or any previous visit.   Assessment & Plan:    1. Urgency of urination *** - Urinalysis, Routine w reflex microscopic  2. Benign prostatic hyperplasia with urinary obstruction *** - BLADDER SCAN AMB NON-IMAGING  3. Nocturia ***   No follow-ups on file.  Nicolette Bang, MD  [facility] [place]  \

## 2022-02-20 ENCOUNTER — Other Ambulatory Visit: Payer: Self-pay | Admitting: Urology

## 2022-02-20 DIAGNOSIS — N138 Other obstructive and reflux uropathy: Secondary | ICD-10-CM

## 2022-02-20 DIAGNOSIS — R3915 Urgency of urination: Secondary | ICD-10-CM

## 2022-02-20 DIAGNOSIS — R351 Nocturia: Secondary | ICD-10-CM

## 2022-02-27 DIAGNOSIS — K080 Exfoliation of teeth due to systemic causes: Secondary | ICD-10-CM | POA: Diagnosis not present

## 2022-03-05 DIAGNOSIS — H524 Presbyopia: Secondary | ICD-10-CM | POA: Diagnosis not present

## 2022-03-07 ENCOUNTER — Other Ambulatory Visit: Payer: Self-pay | Admitting: Urology

## 2022-03-07 DIAGNOSIS — R351 Nocturia: Secondary | ICD-10-CM

## 2022-03-07 DIAGNOSIS — R3915 Urgency of urination: Secondary | ICD-10-CM

## 2022-03-07 DIAGNOSIS — N138 Other obstructive and reflux uropathy: Secondary | ICD-10-CM

## 2022-03-23 ENCOUNTER — Encounter: Payer: Self-pay | Admitting: Urology

## 2022-03-23 ENCOUNTER — Ambulatory Visit: Payer: Medicare Other | Admitting: Urology

## 2022-03-23 VITALS — BP 122/76 | HR 82

## 2022-03-23 DIAGNOSIS — N401 Enlarged prostate with lower urinary tract symptoms: Secondary | ICD-10-CM | POA: Diagnosis not present

## 2022-03-23 DIAGNOSIS — N138 Other obstructive and reflux uropathy: Secondary | ICD-10-CM

## 2022-03-23 LAB — URINALYSIS, ROUTINE W REFLEX MICROSCOPIC
Bilirubin, UA: NEGATIVE
Glucose, UA: NEGATIVE
Nitrite, UA: NEGATIVE
Protein,UA: NEGATIVE
RBC, UA: NEGATIVE
Specific Gravity, UA: 1.015 (ref 1.005–1.030)
Urobilinogen, Ur: 0.2 mg/dL (ref 0.2–1.0)
pH, UA: 5.5 (ref 5.0–7.5)

## 2022-03-23 LAB — MICROSCOPIC EXAMINATION: Bacteria, UA: NONE SEEN

## 2022-03-23 MED ORDER — CIPROFLOXACIN HCL 500 MG PO TABS
500.0000 mg | ORAL_TABLET | Freq: Once | ORAL | Status: AC
  Administered 2022-03-23: 500 mg via ORAL

## 2022-03-23 NOTE — Patient Instructions (Addendum)
Dear David Mooney,   Thank you for choosing [facility] [place] to assist in your urologic care for your upcoming surgery. The following information below includes specific dates and details related to surgery:   The Surgical Procedure you are scheduled to have performed is Cystoscopy with Urolift   Surgery Date: 04/30/2022   Physician performing the surgery: Dr. Nicolette Bang   Do not eat or drink after midnight the day before your surgery.    You will need a driver the day of surgery and will not be able to operate heavy machinery for 24 hours after.    Your surgery will be performed at  [facility] [address], [place] [address]   Enter at the Main Entrance and check in at the [place] desk.     Pre-Admit Testing Info   Pre- Admit appointments are interview with an anesthesiologist or a pre-operative anesthesia nurse. These appointments are typically completed as an in person visit but can take place over the telephone.  You will be contacted to confirm the date and time window.    If you have any questions or concerns, please don't hesitate to call the office at [phone]   Thank you,    Gibson Ramp, RN Clinical Surgery Coordinator [facility]    Benign Prostatic Hyperplasia  Benign prostatic hyperplasia (BPH) is an enlarged prostate gland that is caused by the normal aging process. The prostate may get bigger as a man gets older. The condition is not caused by cancer. The prostate is a walnut-sized gland that is involved in the production of semen. It is located in front of the rectum and below the bladder. The bladder stores urine. The urethra carries stored urine out of the body. An enlarged prostate can press on the urethra. This can make it harder to pass urine. The buildup of urine in the bladder can cause infection. Back pressure and infection may progress to bladder damage and kidney (renal) failure. What are  the causes? This condition is part of the normal aging process. However, not all men develop problems from this condition. If the prostate enlarges away from the urethra, urine flow will not be blocked. If it enlarges toward the urethra and compresses it, there will be problems passing urine. What increases the risk? This condition is more likely to develop in men older than 50 years. What are the signs or symptoms? Symptoms of this condition include: Getting up often during the night to urinate. Needing to urinate frequently during the day. Difficulty starting urine flow. Decrease in size and strength of your urine stream. Leaking (dribbling) after urinating. Inability to pass urine. This needs immediate treatment. Inability to completely empty your bladder. Pain when you pass urine. This is more common if there is also an infection. Urinary tract infection (UTI). How is this diagnosed? This condition is diagnosed based on your medical history, a physical exam, and your symptoms. Tests will also be done, such as: A post-void bladder scan. This measures any amount of urine that may remain in your bladder after you finish urinating. A digital rectal exam. In a rectal exam, your health care provider checks your prostate by putting a lubricated, gloved finger into your rectum to feel the back of your prostate gland. This exam detects the size of your gland and any abnormal lumps or growths. An exam of your urine (urinalysis). A prostate specific antigen (PSA) screening. This is a blood test  used to screen for prostate cancer. An ultrasound. This test uses sound waves to electronically produce a picture of your prostate gland. Your health care provider may refer you to a specialist in kidney and prostate diseases (urologist). How is this treated? Once symptoms begin, your health care provider will monitor your condition (active surveillance or watchful waiting). Treatment for this condition will  depend on the severity of your condition. Treatment may include: Observation and yearly exams. This may be the only treatment needed if your condition and symptoms are mild. Medicines to relieve your symptoms, including: Medicines to shrink the prostate. Medicines to relax the muscle of the prostate. Surgery in severe cases. Surgery may include: Prostatectomy. In this procedure, the prostate tissue is removed completely through an open incision or with a laparoscope or robotics. Transurethral resection of the prostate (TURP). In this procedure, a tool is inserted through the opening at the tip of the penis (urethra). It is used to cut away tissue of the inner core of the prostate. The pieces are removed through the same opening of the penis. This removes the blockage. Transurethral incision (TUIP). In this procedure, small cuts are made in the prostate. This lessens the prostate's pressure on the urethra. Transurethral microwave thermotherapy (TUMT). This procedure uses microwaves to create heat. The heat destroys and removes a small amount of prostate tissue. Transurethral needle ablation (TUNA). This procedure uses radio frequencies to destroy and remove a small amount of prostate tissue. Interstitial laser coagulation ([place]). This procedure uses a laser to destroy and remove a small amount of prostate tissue. Transurethral electrovaporization (TUVP). This procedure uses electrodes to destroy and remove a small amount of prostate tissue. Prostatic urethral lift. This procedure inserts an implant to push the lobes of the prostate away from the urethra. Follow these instructions at home: Take over-the-counter and prescription medicines only as told by your health care provider. Monitor your symptoms for any changes. Contact your health care provider with any changes. Avoid drinking large amounts of liquid before going to bed or out in public. Avoid or reduce how much caffeine or alcohol you  drink. Give yourself time when you urinate. Keep all follow-up visits. This is important. Contact a health care provider if: You have unexplained back pain. Your symptoms do not get better with treatment. You develop side effects from the medicine you are taking. Your urine becomes very dark or has a bad smell. Your lower abdomen becomes distended and you have trouble passing urine. Get help right away if: You have a fever or chills. You suddenly cannot urinate. You feel light-headed or very dizzy, or you faint. There are large amounts of blood or clots in your urine. Your urinary problems become hard to manage. You develop moderate to severe low back or flank pain. The flank is the side of your body between the ribs and the hip. These symptoms may be an emergency. Get help right away. Call 911. Do not wait to see if the symptoms will go away. Do not drive yourself to the hospital. Summary Benign prostatic hyperplasia (BPH) is an enlarged prostate that is caused by the normal aging process. It is not caused by cancer. An enlarged prostate can press on the urethra. This can make it hard to pass urine. This condition is more likely to develop in men older than 50 years. Get help right away if you suddenly cannot urinate. This information is not intended to replace advice given to you by your health care provider.  Make sure you discuss any questions you have with your health care provider. Document Revised: 07/20/2020 Document Reviewed: 07/20/2020 Elsevier Patient Education  [place].

## 2022-03-23 NOTE — Progress Notes (Signed)
   03/23/22  CC: difficulty urinating   HPI: Mr Armendariz is a 82yo here for followup for difficulty urinating Blood pressure 122/76, pulse 82. NED. A&Ox3.   No respiratory distress   Abd soft, NT, ND Normal phallus with bilateral descended testicles  Cystoscopy Procedure Note  Patient identification was confirmed, informed consent was obtained, and patient was prepped using Betadine solution.  Lidocaine jelly was administered per urethral meatus.     Pre-Procedure: - Inspection reveals a normal caliber ureteral meatus.  Procedure: The flexible cystoscope was introduced without difficulty - No urethral strictures/lesions are present. - Enlarged prostate no median lobe - Normal bladder neck - Bilateral ureteral orifices identified - Bladder mucosa  reveals no ulcers, tumors, or lesions - No bladder stones - No trabeculation  Retroflexion shows no intravesical prostatic protrusion   Post-Procedure: - Patient tolerated the procedure well  Assessment/ Plan: We discussed the management of his BPH including continued medical therapy, Rezum, Urolift, TURP and simple prostatectomy. After discussing the options the patient has elected to proceed with Urolift. Risks/benefits/alternatives discussed.   No follow-ups on file.  Nicolette Bang, MD

## 2022-03-23 NOTE — Progress Notes (Signed)
I spoke with David Mooney. We have discussed possible surgery dates and 04/30/2022 was agreed upon by all parties. Patient given information about surgery date, what to expect pre-operatively and post operatively.    We discussed that a pre-op nurse will be calling to set up the pre-op visit that will take place prior to surgery. Informed patient that our office will communicate any additional care to be provided after surgery.    Patients questions or concerns were discussed during our call. Advised to call our office should there be any additional information, questions or concerns that arise. Patient verbalized understanding.

## 2022-03-25 ENCOUNTER — Other Ambulatory Visit (INDEPENDENT_AMBULATORY_CARE_PROVIDER_SITE_OTHER): Payer: Self-pay | Admitting: Internal Medicine

## 2022-03-26 ENCOUNTER — Encounter (INDEPENDENT_AMBULATORY_CARE_PROVIDER_SITE_OTHER): Payer: Self-pay

## 2022-03-26 NOTE — Telephone Encounter (Signed)
I sent the patient a my chart message asking that he please contact the office to get an appt as the last time he was seen was December 01, 2019 by Dr. Laural Golden.

## 2022-04-04 ENCOUNTER — Telehealth: Payer: Self-pay

## 2022-04-04 DIAGNOSIS — R3915 Urgency of urination: Secondary | ICD-10-CM

## 2022-04-04 MED ORDER — MIRABEGRON ER 25 MG PO TB24: 25.0000 mg | ORAL_TABLET | Freq: Every day | ORAL | 1 refills | Status: DC

## 2022-04-04 NOTE — Telephone Encounter (Signed)
Patient is aware that Myrbetriq 25 mg has been sent ot his pharmacy. Patient voiced understanding

## 2022-04-04 NOTE — Telephone Encounter (Signed)
Patient called and advised they needed a refill on medication below.   Medication: mirabegron ER (MYRBETRIQ) 25 MG TB24 tablet    Pharmacy: [facility] [place], [place] - [address] AT [address]    Thank you

## 2022-04-12 ENCOUNTER — Other Ambulatory Visit (INDEPENDENT_AMBULATORY_CARE_PROVIDER_SITE_OTHER): Payer: Self-pay | Admitting: Gastroenterology

## 2022-04-12 DIAGNOSIS — K5100 Ulcerative (chronic) pancolitis without complications: Secondary | ICD-10-CM

## 2022-04-12 MED ORDER — MESALAMINE 1.2 G PO TBEC: 2.4000 g | DELAYED_RELEASE_TABLET | Freq: Two times a day (BID) | ORAL | 0 refills | Status: DC

## 2022-04-12 NOTE — Telephone Encounter (Signed)
I called the patient and asked what the name of the medication was, as we did not know what # the pharmacy has assigned to the rx. Patient says he is in need of Mesalamine ([place]) 1.2 he takes two po bid. Patient is aware he must keep upcoming appointment on 05/17/2022 at 1015 in order for [place] to give any further refills. Please, send rx in to [facility] on [address] please.Thanks,

## 2022-04-26 ENCOUNTER — Encounter (HOSPITAL_COMMUNITY): Payer: Self-pay

## 2022-04-26 ENCOUNTER — Other Ambulatory Visit: Payer: Self-pay

## 2022-04-26 ENCOUNTER — Encounter (HOSPITAL_COMMUNITY)
Admission: RE | Admit: 2022-04-26 | Discharge: 2022-04-26 | Disposition: A | Discharge status: Home / Self Care | Payer: Medicare Other | Source: Ambulatory Visit | Attending: Urology | Admitting: Urology

## 2022-04-26 HISTORY — DX: Unspecified osteoarthritis, unspecified site: M19.90

## 2022-04-30 ENCOUNTER — Encounter (HOSPITAL_COMMUNITY)
Admission: RE | Disposition: A | Discharge status: Home / Self Care | Payer: Self-pay | Source: Home / Self Care | Attending: Urology

## 2022-04-30 ENCOUNTER — Ambulatory Visit (HOSPITAL_BASED_OUTPATIENT_CLINIC_OR_DEPARTMENT_OTHER): Payer: Medicare Other | Admitting: Anesthesiology

## 2022-04-30 ENCOUNTER — Encounter (HOSPITAL_COMMUNITY): Payer: Self-pay | Admitting: Urology

## 2022-04-30 ENCOUNTER — Ambulatory Visit (HOSPITAL_COMMUNITY): Payer: Medicare Other | Admitting: Anesthesiology

## 2022-04-30 ENCOUNTER — Ambulatory Visit (HOSPITAL_COMMUNITY)
Admission: RE | Admit: 2022-04-30 | Discharge: 2022-04-30 | Disposition: A | Discharge status: Home / Self Care | Payer: Medicare Other | Attending: Urology | Admitting: Urology

## 2022-04-30 DIAGNOSIS — M1990 Unspecified osteoarthritis, unspecified site: Secondary | ICD-10-CM

## 2022-04-30 DIAGNOSIS — R011 Cardiac murmur, unspecified: Secondary | ICD-10-CM | POA: Diagnosis not present

## 2022-04-30 DIAGNOSIS — N138 Other obstructive and reflux uropathy: Secondary | ICD-10-CM

## 2022-04-30 DIAGNOSIS — N401 Enlarged prostate with lower urinary tract symptoms: Secondary | ICD-10-CM

## 2022-04-30 DIAGNOSIS — N3944 Nocturnal enuresis: Secondary | ICD-10-CM | POA: Diagnosis not present

## 2022-04-30 DIAGNOSIS — K5190 Ulcerative colitis, unspecified, without complications: Secondary | ICD-10-CM | POA: Diagnosis not present

## 2022-04-30 DIAGNOSIS — R351 Nocturia: Secondary | ICD-10-CM | POA: Insufficient documentation

## 2022-04-30 DIAGNOSIS — R3915 Urgency of urination: Secondary | ICD-10-CM | POA: Diagnosis not present

## 2022-04-30 DIAGNOSIS — Z8711 Personal history of peptic ulcer disease: Secondary | ICD-10-CM | POA: Diagnosis not present

## 2022-04-30 DIAGNOSIS — N400 Enlarged prostate without lower urinary tract symptoms: Secondary | ICD-10-CM | POA: Diagnosis not present

## 2022-04-30 DIAGNOSIS — Z79899 Other long term (current) drug therapy: Secondary | ICD-10-CM | POA: Insufficient documentation

## 2022-04-30 DIAGNOSIS — K219 Gastro-esophageal reflux disease without esophagitis: Secondary | ICD-10-CM | POA: Diagnosis not present

## 2022-04-30 DIAGNOSIS — N320 Bladder-neck obstruction: Secondary | ICD-10-CM | POA: Diagnosis not present

## 2022-04-30 DIAGNOSIS — D649 Anemia, unspecified: Secondary | ICD-10-CM | POA: Diagnosis not present

## 2022-04-30 DIAGNOSIS — K279 Peptic ulcer, site unspecified, unspecified as acute or chronic, without hemorrhage or perforation: Secondary | ICD-10-CM | POA: Diagnosis not present

## 2022-04-30 HISTORY — DX: Other specified postprocedural states: R11.2

## 2022-04-30 HISTORY — DX: Other specified postprocedural states: Z98.890

## 2022-04-30 HISTORY — PX: CYSTOSCOPY WITH INSERTION OF UROLIFT: SHX6678

## 2022-04-30 HISTORY — DX: Other complications of anesthesia, initial encounter: T88.59XA

## 2022-04-30 SURGERY — CYSTOSCOPY WITH INSERTION OF UROLIFT
Anesthesia: General | Site: Urethra

## 2022-04-30 MED ORDER — CHLORHEXIDINE GLUCONATE 0.12 % MT SOLN: 15.0000 mL | Freq: Once | OROMUCOSAL | Status: AC

## 2022-04-30 MED ORDER — ONDANSETRON HCL 4 MG/2ML IJ SOLN: 4.0000 mg | Freq: Once | INTRAMUSCULAR | Status: DC | PRN

## 2022-04-30 MED ORDER — EPHEDRINE SULFATE (PRESSORS) 50 MG/ML IJ SOLN
INTRAMUSCULAR | Status: DC | PRN
  Administered 2022-04-30: 10 mg via INTRAVENOUS
  Administered 2022-04-30: 5 mg via INTRAVENOUS
  Administered 2022-04-30: 10 mg via INTRAVENOUS

## 2022-04-30 MED ORDER — PROPOFOL 10 MG/ML IV BOLUS
INTRAVENOUS | Status: AC
  Filled 2022-04-30: qty 20

## 2022-04-30 MED ORDER — CEFAZOLIN SODIUM-DEXTROSE 2-4 GM/100ML-% IV SOLN
INTRAVENOUS | Status: AC
  Filled 2022-04-30: qty 100

## 2022-04-30 MED ORDER — WATER FOR IRRIGATION, STERILE IR SOLN
Status: DC | PRN
  Administered 2022-04-30: 3000 mL

## 2022-04-30 MED ORDER — CHLORHEXIDINE GLUCONATE 0.12 % MT SOLN
OROMUCOSAL | Status: AC
  Administered 2022-04-30: 15 mL via OROMUCOSAL
  Filled 2022-04-30: qty 15

## 2022-04-30 MED ORDER — HYDROMORPHONE HCL 1 MG/ML IJ SOLN: 0.2500 mg | INTRAMUSCULAR | Status: DC | PRN

## 2022-04-30 MED ORDER — PROPOFOL 10 MG/ML IV BOLUS
INTRAVENOUS | Status: DC | PRN
  Administered 2022-04-30: 140 mg via INTRAVENOUS

## 2022-04-30 MED ORDER — FENTANYL CITRATE (PF) 250 MCG/5ML IJ SOLN
INTRAMUSCULAR | Status: DC | PRN
  Administered 2022-04-30: 25 ug via INTRAVENOUS

## 2022-04-30 MED ORDER — CEFAZOLIN SODIUM-DEXTROSE 2-4 GM/100ML-% IV SOLN
2.0000 g | INTRAVENOUS | Status: AC
  Administered 2022-04-30: 2 g via INTRAVENOUS

## 2022-04-30 MED ORDER — LACTATED RINGERS IV SOLN: INTRAVENOUS | Status: DC

## 2022-04-30 MED ORDER — ONDANSETRON HCL 4 MG/2ML IJ SOLN
INTRAMUSCULAR | Status: DC | PRN
  Administered 2022-04-30: 4 mg via INTRAVENOUS

## 2022-04-30 MED ORDER — LIDOCAINE HCL URETHRAL/MUCOSAL 2 % EX GEL
CUTANEOUS | Status: AC
  Filled 2022-04-30: qty 10

## 2022-04-30 MED ORDER — TRAMADOL HCL 50 MG PO TABS: 50.0000 mg | ORAL_TABLET | Freq: Four times a day (QID) | ORAL | 0 refills | Status: DC | PRN

## 2022-04-30 MED ORDER — ORAL CARE MOUTH RINSE: 15.0000 mL | Freq: Once | OROMUCOSAL | Status: AC

## 2022-04-30 MED ORDER — LIDOCAINE HCL (CARDIAC) PF 100 MG/5ML IV SOSY
PREFILLED_SYRINGE | INTRAVENOUS | Status: DC | PRN
  Administered 2022-04-30: 80 mg via INTRAVENOUS

## 2022-04-30 MED ORDER — DEXAMETHASONE SODIUM PHOSPHATE 10 MG/ML IJ SOLN
INTRAMUSCULAR | Status: DC | PRN
  Administered 2022-04-30: 5 mg via INTRAVENOUS

## 2022-04-30 MED ORDER — FENTANYL CITRATE (PF) 100 MCG/2ML IJ SOLN
INTRAMUSCULAR | Status: AC
  Filled 2022-04-30: qty 2

## 2022-04-30 SURGICAL SUPPLY — 18 items
BAG DRAIN URO TABLE W/ADPT NS (BAG) ×1 IMPLANT
BAG DRN 8 ADPR NS SKTRN CSTL (BAG) ×1
BAG HAMPER (MISCELLANEOUS) ×1 IMPLANT
CLOTH BEACON ORANGE TIMEOUT ST (SAFETY) ×1 IMPLANT
GLOVE BIO SURGEON STRL SZ8 (GLOVE) ×1 IMPLANT
GLOVE BIOGEL PI IND STRL 7.0 (GLOVE) ×2 IMPLANT
GOWN STRL REUS W/TWL LRG LVL3 (GOWN DISPOSABLE) ×1 IMPLANT
GOWN STRL REUS W/TWL XL LVL3 (GOWN DISPOSABLE) ×1 IMPLANT
KIT TURNOVER CYSTO (KITS) ×1 IMPLANT
MANIFOLD NEPTUNE II (INSTRUMENTS) ×1 IMPLANT
PACK CYSTO (CUSTOM PROCEDURE TRAY) ×1 IMPLANT
PAD ARMBOARD 7.5X6 YLW CONV (MISCELLANEOUS) ×1 IMPLANT
SYSTEM UROLIFT (Male Continence) IMPLANT
TOWEL OR 17X26 4PK STRL BLUE (TOWEL DISPOSABLE) ×1 IMPLANT
TRAY FOLEY W/BAG SLVR 16FR (SET/KITS/TRAYS/PACK) ×2
TRAY FOLEY W/BAG SLVR 16FR ST (SET/KITS/TRAYS/PACK) ×1 IMPLANT
WATER STERILE IRR 3000ML UROMA (IV SOLUTION) ×1 IMPLANT
WATER STERILE IRR 500ML POUR (IV SOLUTION) ×1 IMPLANT

## 2022-04-30 NOTE — Op Note (Signed)
   PREOPERATIVE DIAGNOSIS:  Benign prostatic hypertrophy with bladder outlet obstruction.  POSTOPERATIVE DIAGNOSIS:  Benign prostatic hypertrophy with bladder outlet obstruction.  PROCEDURE:  Cystoscopy with implantation of UroLift devices, 4 implants.  SURGEON:  Wilkie Aye, M.D.  ANESTHESIA:  General  ANTIBIOTICS: ancef  SPECIMEN:  None.  DRAINS:  A 16-French Foley catheter.  BLOOD LOSS:  Minimal.  COMPLICATIONS:  None.  INDICATIONS: The Patient is an 82 year old male with BPH and bladder outlet obstruction.  He has failed medical therapy and has elected UroLift for definitive treatment.  FINDINGS OF PROCEDURE:  He was taken to the operating room where a genral anesthetic was induced.  He was placed in lithotomy position and was fitted with PAS hose.  His perineum and genitalia were prepped with chlorhexidine, and he was draped in usual sterile fashion.  Cystoscopy was performed using the UroLift scope and 0 degree lens. Examination revealed a normal urethra.  The external sphincter was intact.  Prostatic urethra was approximately 4 cm in length with lateral lobe enlargement. There was also little bit of bladder neck elevation. Inspection of bladder revealed mild-to-moderate trabeculation with no tumors, stones, or inflammation.  No cellules or diverticula were noted. Ureteral orifices were in their normal anatomic position effluxing clear urine.  After initial cystoscopy, the visual obturator was replaced with the first UroLift device.  This was turned to the 9 o'clock position and pulled back to the veru and then slightly advanced.  Pressure was then applied to the right lateral lobe and the UroLift device was deployed.  The second UroLift device was then inserted and applied to the left lateral lobe at 3 o'clock and deployed in the mid prostatic urethra. After this, there was still some apparent obstruction closer to the bladder neck.  So a second level of  UroLift your left device was applied between the mid urethra and the proximal urethra providing further patency to the prostatic urethra.  The scope was removed and a 16-French Foley catheter was inserted without difficulty. The balloon was filled with 10 mL sterile fluid, and the catheter was placed to straight drainage.  COMPLICATIONS: None   CONDITION: Stable, extubated, transferred to PACU  PLAN: The patient will be discharged home and followup in 2 days for a voiding trial.

## 2022-04-30 NOTE — Anesthesia Procedure Notes (Signed)
Procedure Name: LMA Insertion Date/Time: 04/30/2022 12:26 PM  Performed by: Lucinda Dell, CRNAPre-anesthesia Checklist: Patient identified, Emergency Drugs available, Suction available and Patient being monitored Patient Re-evaluated:Patient Re-evaluated prior to induction Oxygen Delivery Method: Circle system utilized Preoxygenation: Pre-oxygenation with 100% oxygen Induction Type: IV induction Ventilation: Mask ventilation without difficulty LMA: LMA inserted LMA Size: 4.0 Tube type: Oral Number of attempts: 1 Placement Confirmation: positive ETCO2 and breath sounds checked- equal and bilateral Tube secured with: Tape Dental Injury: Teeth and Oropharynx as per pre-operative assessment

## 2022-04-30 NOTE — Transfer of Care (Signed)
Immediate Anesthesia Transfer of Care Note  Patient: David Mooney  Procedure(s) Performed: CYSTOSCOPY WITH INSERTION OF UROLIFT (Urethra)  Patient Location: PACU  Anesthesia Type:General  Level of Consciousness: awake and alert   Airway & Oxygen Therapy: Patient Spontanous Breathing  Post-op Assessment: Report given to RN and Post -op Vital signs reviewed and stable  Post vital signs: Reviewed and stable  Last Vitals:  Vitals Value Taken Time  BP 136/77   Temp 97.4   Pulse 98 04/30/22 1256  Resp 11 04/30/22 1256  SpO2 99 % 04/30/22 1256  Vitals shown include unvalidated device data.  Last Pain:  Vitals:   04/30/22 1040  TempSrc: Oral  PainSc: 0-No pain         Complications: No notable events documented.

## 2022-04-30 NOTE — H&P (Signed)
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 IPSS 12 QOL 3 on finasteride, uroxatral 10mg  in AM and silodosin. He has incontinence at night and wears 1-2 pads at night. His urine stream is fair. He has intermittent urinary urgency and frequency. He has a very remote history of having a cystoscopy for microhematuria.      PMH:     Past Medical History:  Diagnosis Date   Cataract     GERD (gastroesophageal reflux disease)     Glaucoma     Hernia of unspecified site of abdominal cavity without mention of obstruction or gangrene     History of gastric ulcer     Ulcerative colitis        Surgical History:      Past Surgical History:  Procedure Laterality Date   BIOPSY   12/04/2018    Procedure: BIOPSY;  Surgeon: Malissa Hippo, MD;  [facility];  Service: Endoscopy;;  cecum   COLONOSCOPY       COLONOSCOPY N/A 08/27/2012    Procedure: COLONOSCOPY;  Surgeon: Malissa Hippo, MD;  [facility];  Service: Endoscopy;  Laterality: N/A;  1200-moved to 13:15 Ann to notify pt   COLONOSCOPY N/A 12/04/2018    Procedure: COLONOSCOPY;  Surgeon: Malissa Hippo, MD;  [facility];  Service: Endoscopy;  Laterality: N/A;  155pm   HERNIA REPAIR       MOLE REMOVAL        From head , chest   UPPER GASTROINTESTINAL ENDOSCOPY          Home Medications:  Allergies as of 02/09/2022   No Known Allergies         Medication List           Accurate as of February 09, 2022 11:31 AM. If you have any questions, ask your nurse or doctor.              acetaminophen 500 MG tablet Commonly known as: TYLENOL Take 500 mg by mouth every 6 (six) hours as needed for headache.    acetaminophen 650 MG CR tablet Commonly known as: TYLENOL Take 650 mg by mouth every 8 (eight) hours as needed for pain.    alfuzosin 10 MG 24 hr tablet Commonly known as: UROXATRAL Take 1 tablet (10 mg total) by mouth at bedtime.    ALIGN PO Take 1  tablet by mouth daily.    bimatoprost 0.01 % Soln Commonly known as: LUMIGAN Place 1 drop into both eyes at bedtime.    cholecalciferol 25 MCG (1000 UNIT) tablet Commonly known as: VITAMIN D3 Take 1,000 Units by mouth daily.    esomeprazole 40 MG capsule Commonly known as: NEXIUM Take 40 mg by mouth daily before breakfast.    finasteride 5 MG tablet Commonly known as: PROSCAR Take 1 tablet (5 mg total) by mouth daily.    fluticasone 50 MCG/ACT nasal spray Commonly known as: FLONASE Place 2 sprays into both nostrils daily.    guaiFENesin 600 MG 12 hr tablet Commonly known as: MUCINEX Take 600 mg by mouth daily. expectorant    magnesium oxide 400 MG tablet Commonly known as: MAG-OX Take 400 mg by mouth daily.    mesalamine 1.2 g EC tablet Commonly known as: LIALDA TAKE 2 TABLETS BY MOUTH TWICE DAILY    Multiple Vitamins tablet Take 1 tablet by mouth daily.    psyllium 58.6 % packet Commonly  known as: METAMUCIL Take 1 packet by mouth daily.    silodosin 8 MG Caps capsule Commonly known as: RAPAFLO TAKE 1 CAPSULE(8 MG) BY MOUTH AT BEDTIME    tamsulosin 0.4 MG Caps capsule Commonly known as: FLOMAX Take 1 capsule (0.4 mg total) by mouth daily.             Allergies: No Known Allergies   Family History:      Family History  Problem Relation Age of Onset   Arrhythmia Mother     Anuerysm Father     Hypertension Father     Diabetes Father     Healthy Sister     Healthy Sister     Irritable bowel syndrome Daughter     Thyroid disease Daughter        Social History:  reports that he has never smoked. He has never used smokeless tobacco. He reports that he does not drink alcohol and does not use drugs.   ROS: All other review of systems were reviewed and are negative except what is noted above in HPI   Physical Exam: BP 115/76   Pulse 85   Wt 188 lb (85.3 kg)   BMI 30.34 kg/m   Constitutional:  Alert and oriented, No acute distress. HEENT: [place] AT,  moist mucus membranes.  Trachea midline, no masses. Cardiovascular: No clubbing, cyanosis, or edema. Respiratory: Normal respiratory effort, no increased work of breathing. GI: Abdomen is soft, nontender, nondistended, no abdominal masses GU: No CVA tenderness.  Lymph: No cervical or inguinal lymphadenopathy. Skin: No rashes, bruises or suspicious lesions. Neurologic: Grossly intact, no focal deficits, moving all 4 extremities. Psychiatric: Normal mood and affect.   Laboratory Data: Recent Labs       Lab Results  Component Value Date    WBC 6.4 09/16/2018    HGB 12.9 (L) 09/16/2018    HCT 38.6 09/16/2018    MCV 90.8 09/16/2018    PLT 172 09/16/2018        Recent Labs       Lab Results  Component Value Date    CREATININE 1.16 09/16/2018        Recent Labs  No results found for: "PSA"     Recent Labs  No results found for: "TESTOSTERONE"     Recent Labs  No results found for: "HGBA1C"     Urinalysis Labs (Brief)          Component Value Date/Time    COLORURINE YELLOW 01/02/2018 1433    APPEARANCEUR Clear 02/10/2021 1322    LABSPEC 1.011 01/02/2018 1433    PHURINE 6.0 01/02/2018 1433    GLUCOSEU Negative 02/10/2021 1322    HGBUR SMALL (A) 01/02/2018 1433    BILIRUBINUR Negative 02/10/2021 1322    KETONESUR 5 (A) 01/02/2018 1433    PROTEINUR Negative 02/10/2021 1322    PROTEINUR NEGATIVE 01/02/2018 1433    NITRITE Negative 02/10/2021 1322    NITRITE NEGATIVE 01/02/2018 1433    LEUKOCYTESUR Trace (A) 02/10/2021 1322        Recent Labs       Lab Results  Component Value Date    LABMICR See below: 02/10/2021    WBCUA 0-5 02/10/2021    LABEPIT 0-10 02/10/2021    MUCUS Present 02/10/2021    BACTERIA Few 02/10/2021        Pertinent Imaging:   No results found for this or any previous visit.   No results found for this or any previous visit.  No results found for this or any previous visit.   No results found for this or any previous visit.    No results found for this or any previous visit.   No valid procedures specified. No results found for this or any previous visit.   No results found for this or any previous visit.     Assessment & Plan:     1. BPh with nocturia We discussed the management of his BPH including continued medical therapy, Rezum, Urolift, TURP and simple prostatectomy. After discussing the options the patient has elected to proceed with Urolift. Risks/benefits/alternatives discussed.      No follow-ups on file.   Wilkie Aye, MD   [facility] [place]  \

## 2022-04-30 NOTE — Progress Notes (Signed)
Foley catheter teaching done with patient, patients wife and daughter.  Explained proper hand hygience and how to empty the catheter and monitor how much was emptied.  Explained keeping bag from touching floor and keeping below level of bladder. They all voiced understanding.

## 2022-04-30 NOTE — Anesthesia Preprocedure Evaluation (Addendum)
Anesthesia Evaluation  Patient identified by MRN, date of birth, ID band Patient awake    Reviewed: Allergy & Precautions, H&P , NPO status , Patient's Chart, lab work & pertinent test results  History of Anesthesia Complications (+) PONV and history of anesthetic complications  Airway Mallampati: II  TM Distance: >3 FB Neck ROM: Full    Dental  (+) Dental Advisory Given, Chipped,    Pulmonary neg pulmonary ROS   Pulmonary exam normal breath sounds clear to auscultation       Cardiovascular + Valvular Problems/Murmurs  Rhythm:Regular Rate:Normal + Systolic murmurs    Neuro/Psych negative neurological ROS  negative psych ROS   GI/Hepatic Neg liver ROS, PUD,GERD  Medicated and Controlled,,Ulcerative colitis    Endo/Other  negative endocrine ROS    Renal/GU negative Renal ROS  negative genitourinary   Musculoskeletal  (+) Arthritis , Osteoarthritis,    Abdominal   Peds negative pediatric ROS (+)  Hematology  (+) Blood dyscrasia, anemia   Anesthesia Other Findings   Reproductive/Obstetrics negative OB ROS                             Anesthesia Physical Anesthesia Plan  ASA: 3  Anesthesia Plan: General   Post-op Pain Management: Dilaudid IV   Induction: Intravenous  PONV Risk Score and Plan: 3 and Ondansetron  Airway Management Planned: LMA  Additional Equipment:   Intra-op Plan:   Post-operative Plan: Extubation in OR  Informed Consent: I have reviewed the patients History and Physical, chart, labs and discussed the procedure including the risks, benefits and alternatives for the proposed anesthesia with the patient or authorized representative who has indicated his/her understanding and acceptance.     Dental advisory given  Plan Discussed with: CRNA and Surgeon  Anesthesia Plan Comments:        Anesthesia Quick Evaluation

## 2022-04-30 NOTE — Anesthesia Postprocedure Evaluation (Signed)
Anesthesia Post Note  Patient: David Mooney  Procedure(s) Performed: CYSTOSCOPY WITH INSERTION OF UROLIFT (Urethra)  Patient location during evaluation: Phase II Anesthesia Type: General Level of consciousness: awake and alert and oriented Pain management: pain level controlled Vital Signs Assessment: post-procedure vital signs reviewed and stable Respiratory status: spontaneous breathing, nonlabored ventilation and respiratory function stable Cardiovascular status: blood pressure returned to baseline and stable Postop Assessment: no apparent nausea or vomiting Anesthetic complications: no  No notable events documented.   Last Vitals:  Vitals:   04/30/22 1330 04/30/22 1339  BP: (!) 144/85 (!) 159/86  Pulse: 82 82  Resp: 16 16  Temp:  (!) 36.4 C  SpO2: 100% 99%    Last Pain:  Vitals:   04/30/22 1339  TempSrc:   PainSc: 2                  David Mooney C David Mooney

## 2022-05-02 ENCOUNTER — Ambulatory Visit: Payer: Medicare Other | Admitting: Urology

## 2022-05-02 VITALS — BP 97/64 | HR 84

## 2022-05-02 DIAGNOSIS — N401 Enlarged prostate with lower urinary tract symptoms: Secondary | ICD-10-CM | POA: Diagnosis not present

## 2022-05-02 DIAGNOSIS — N138 Other obstructive and reflux uropathy: Secondary | ICD-10-CM

## 2022-05-02 DIAGNOSIS — R351 Nocturia: Secondary | ICD-10-CM | POA: Diagnosis not present

## 2022-05-02 MED ORDER — CIPROFLOXACIN HCL 500 MG PO TABS
500.0000 mg | ORAL_TABLET | Freq: Once | ORAL | Status: AC
Start: 2022-05-02 — End: 2022-05-02
  Administered 2022-05-02: 500 mg via ORAL

## 2022-05-02 NOTE — Patient Instructions (Signed)

## 2022-05-02 NOTE — Progress Notes (Signed)
05/02/2022 9:51 AM   David Mooney 1940-08-06 [id number]  Referring provider: Juliette Alcide, MD [address] [place],  [place] [address]  Followup BPH   HPI: [name] is a 81yo here for followup for BPH. He passed his voiding trial after urolift. No dysuria or hematuria. No other complaints today   PMH: Past Medical History:  Diagnosis Date   Arthritis    Cataract    Complication of anesthesia    GERD (gastroesophageal reflux disease)    Glaucoma    Hernia of unspecified site of abdominal cavity without mention of obstruction or gangrene    History of gastric ulcer    PONV (postoperative nausea and vomiting)    Ulcerative colitis     Surgical History: Past Surgical History:  Procedure Laterality Date   BIOPSY  12/04/2018   Procedure: BIOPSY;  Surgeon: David Hippo, MD;  [facility];  Service: Endoscopy;;  cecum   COLONOSCOPY     COLONOSCOPY N/A 08/27/2012   Procedure: COLONOSCOPY;  Surgeon: David Hippo, MD;  [facility];  Service: Endoscopy;  Laterality: N/A;  1200-moved to 13:15 Ann to notify pt   COLONOSCOPY N/A 12/04/2018   Procedure: COLONOSCOPY;  Surgeon: David Hippo, MD;  [facility];  Service: Endoscopy;  Laterality: N/A;  155pm   HERNIA REPAIR Left    MOLE REMOVAL     From head , chest   UPPER GASTROINTESTINAL ENDOSCOPY      Home Medications:  Allergies as of 05/02/2022   No Known Allergies      Medication List        Accurate as of May 02, 2022  9:51 AM. If you have any questions, ask your nurse or doctor.          acetaminophen 650 MG CR tablet Commonly known as: TYLENOL Take 650 mg by mouth every 8 (eight) hours as needed for pain.   alfuzosin 10 MG 24 hr tablet Commonly known as: UROXATRAL TAKE 1 TABLET(10 MG) BY MOUTH AT BEDTIME   ALIGN PO Take 1 tablet by mouth daily.   atorvastatin 20 MG tablet Commonly known as: LIPITOR Take 20 mg by mouth daily.   cholecalciferol 25 MCG  (1000 UNIT) tablet Commonly known as: VITAMIN D3 Take 1,000 Units by mouth daily.   esomeprazole 40 MG capsule Commonly known as: NEXIUM Take 40 mg by mouth daily before breakfast.   finasteride 5 MG tablet Commonly known as: PROSCAR TAKE 1 TABLET(5 MG) BY MOUTH DAILY   fluticasone 50 MCG/ACT nasal spray Commonly known as: FLONASE Place 2 sprays into both nostrils daily as needed for allergies.   mesalamine 1.2 g EC tablet Commonly known as: LIALDA Take 2 tablets (2.4 g total) by mouth 2 (two) times daily.   mirabegron ER 25 MG Tb24 tablet Commonly known as: MYRBETRIQ Take 1 tablet (25 mg total) by mouth daily.   Multiple Vitamins tablet Take 1 tablet by mouth daily.   silodosin 8 MG Caps capsule Commonly known as: RAPAFLO Take 1 capsule (8 mg total) by mouth daily with breakfast.   traMADol 50 MG tablet Commonly known as: Ultram Take 1 tablet (50 mg total) by mouth every 6 (six) hours as needed.   Vyzulta 0.024 % Soln Generic drug: Latanoprostene Bunod Place 1 drop into both eyes every evening.        Allergies: No Known Allergies  Family History: Family History  Problem Relation Age of Onset  Arrhythmia Mother    Anuerysm Father    Hypertension Father    Diabetes Father    Healthy Sister    Healthy Sister    Irritable bowel syndrome Daughter    Thyroid disease Daughter     Social History:  reports that he has never smoked. He has never used smokeless tobacco. He reports that he does not drink alcohol and does not use drugs.  ROS: All other review of systems were reviewed and are negative except what is noted above in HPI  Physical Exam: BP 97/64   Pulse 84   Constitutional:  Alert and oriented, No acute distress. HEENT: [place] AT, moist mucus membranes.  Trachea midline, no masses. Cardiovascular: No clubbing, cyanosis, or edema. Respiratory: Normal respiratory effort, no increased work of breathing. GI: Abdomen is soft, nontender, nondistended, no  abdominal masses GU: No CVA tenderness.  Lymph: No cervical or inguinal lymphadenopathy. Skin: No rashes, bruises or suspicious lesions. Neurologic: Grossly intact, no focal deficits, moving all 4 extremities. Psychiatric: Normal mood and affect.  Laboratory Data: Lab Results  Component Value Date   WBC 6.4 09/16/2018   HGB 12.9 (L) 09/16/2018   HCT 38.6 09/16/2018   MCV 90.8 09/16/2018   PLT 172 09/16/2018    Lab Results  Component Value Date   CREATININE 1.16 09/16/2018    No results found for: "PSA"  No results found for: "TESTOSTERONE"  No results found for: "HGBA1C"  Urinalysis    Component Value Date/Time   COLORURINE YELLOW 01/02/2018 1433   APPEARANCEUR Clear 03/23/2022 1200   LABSPEC 1.011 01/02/2018 1433   PHURINE 6.0 01/02/2018 1433   GLUCOSEU Negative 03/23/2022 1200   HGBUR SMALL (A) 01/02/2018 1433   BILIRUBINUR Negative 03/23/2022 1200   KETONESUR 5 (A) 01/02/2018 1433   PROTEINUR Negative 03/23/2022 1200   PROTEINUR NEGATIVE 01/02/2018 1433   NITRITE Negative 03/23/2022 1200   NITRITE NEGATIVE 01/02/2018 1433   LEUKOCYTESUR Trace (A) 03/23/2022 1200    Lab Results  Component Value Date   LABMICR See below: 03/23/2022   WBCUA 0-5 03/23/2022   LABEPIT 0-10 03/23/2022   MUCUS Present (A) 02/09/2022   BACTERIA None seen 03/23/2022    Pertinent Imaging:  No results found for this or any previous visit.  No results found for this or any previous visit.  No results found for this or any previous visit.  No results found for this or any previous visit.  No results found for this or any previous visit.  No valid procedures specified. No results found for this or any previous visit.  No results found for this or any previous visit.   Assessment & Plan:    1. Benign prostatic hyperplasia with urinary obstruction Followup 6 weeks - ciprofloxacin (CIPRO) tablet 500 mg - Bladder Voiding Trial   No follow-ups on file.  David Aye, MD  [facility]

## 2022-05-02 NOTE — Progress Notes (Signed)
Fill and Pull Catheter Removal  Patient is present today for a catheter removal.  Patient was cleaned and prepped in a sterile fashion of sterile water/ saline was instilled into the bladder when the patient felt the urge to urinate. 10ml of water was then drained from the balloon.  A 16FR foley cath was removed from the bladder no complications were noted .  Patient as then given some time to void on their own.  Patient can void  on their own after some time.  Patient tolerated well.  Performed by: Sirius Woodford LPN  Follow up/ Additional notes: Per MD note

## 2022-05-04 ENCOUNTER — Encounter (HOSPITAL_COMMUNITY): Payer: Self-pay | Admitting: Urology

## 2022-05-06 ENCOUNTER — Encounter: Payer: Self-pay | Admitting: Urology

## 2022-05-17 ENCOUNTER — Encounter (INDEPENDENT_AMBULATORY_CARE_PROVIDER_SITE_OTHER): Payer: Self-pay | Admitting: Gastroenterology

## 2022-05-17 ENCOUNTER — Ambulatory Visit (INDEPENDENT_AMBULATORY_CARE_PROVIDER_SITE_OTHER): Payer: Medicare Other | Admitting: Gastroenterology

## 2022-05-17 VITALS — BP 133/73 | HR 80 | Temp 97.6°F | Ht 66.0 in | Wt 188.7 lb

## 2022-05-17 DIAGNOSIS — K5100 Ulcerative (chronic) pancolitis without complications: Secondary | ICD-10-CM | POA: Diagnosis not present

## 2022-05-17 DIAGNOSIS — K219 Gastro-esophageal reflux disease without esophagitis: Secondary | ICD-10-CM

## 2022-05-17 MED ORDER — ESOMEPRAZOLE MAGNESIUM 40 MG PO CPDR
40.0000 mg | DELAYED_RELEASE_CAPSULE | Freq: Every day | ORAL | 3 refills | Status: DC
Start: 2022-05-17 — End: 2022-12-31

## 2022-05-17 MED ORDER — MESALAMINE 1.2 G PO TBEC
2.4000 g | DELAYED_RELEASE_TABLET | Freq: Two times a day (BID) | ORAL | 3 refills | Status: DC
Start: 2022-05-17 — End: 2022-10-30

## 2022-05-17 NOTE — Progress Notes (Addendum)
Katrinka Blazing, M.D. Gastroenterology & Hepatology [facility] [address] [place], [place] [address]  Primary Care Physician: Juliette Alcide, MD [address] [place] [address]  I will communicate my assessment and recommendations to the referring MD via EMR.  Problems: Chronic ulcerative colitis GERD  History of Present Illness: David Mooney is a 81 y.o. male with past medical history of glaucoma, arthritis, ulcerative colitis and GERD who presents for follow up of ulcerative colitis and GERD.  The patient was last seen on 12/01/2019. At that time, the patient was continued on Lialda 2.4 g twice daily.  He was kept on Nexium 40 mg daily.  Patient reports that he has presented some episodes of diarrhea. Believes this happens once a month - states when this happens his bowels are very loose. He takes antidiarrheals (Imodium AD) when this happens. The patient denies having any nausea, vomiting, fever, chills, hematochezia, melena, hematemesis, abdominal distention, abdominal pain, jaundice, pruritus or weight loss.  He reports that he is not having frequent heartburn, had one isolated episode in the last 6 months. No odynophagia or odynophagia. He is taking Nexium 40 mg every day just before breakfast.  Last EGD: many years ago, cannot remember exactly when Last Colonoscopy: 12/04/2018 Normal terminal ileum Area of mildly congested mucosa in the cecum which was biopsied (mildly active chronic colitis), multiple scars in the colon related to healed colitis, 2 small polyps in the splenic flexure.  Pathology consistent with tubular adenomas. Recommended repeat colonoscopy in 5 years.  Past Medical History: Past Medical History:  Diagnosis Date   Arthritis    Cataract    Complication of anesthesia    GERD (gastroesophageal reflux disease)    Glaucoma    Hernia of unspecified site of abdominal cavity without mention of obstruction or  gangrene    History of gastric ulcer    PONV (postoperative nausea and vomiting)    Ulcerative colitis     Past Surgical History: Past Surgical History:  Procedure Laterality Date   BIOPSY  12/04/2018   Procedure: BIOPSY;  Surgeon: Malissa Hippo, MD;  [facility];  Service: Endoscopy;;  cecum   COLONOSCOPY     COLONOSCOPY N/A 08/27/2012   Procedure: COLONOSCOPY;  Surgeon: Malissa Hippo, MD;  [facility];  Service: Endoscopy;  Laterality: N/A;  1200-moved to 13:15 Ann to notify pt   COLONOSCOPY N/A 12/04/2018   Procedure: COLONOSCOPY;  Surgeon: Malissa Hippo, MD;  [facility];  Service: Endoscopy;  Laterality: N/A;  155pm   CYSTOSCOPY WITH INSERTION OF UROLIFT N/A 04/30/2022   Procedure: CYSTOSCOPY WITH INSERTION OF UROLIFT;  Surgeon: Malen Gauze, MD;  Location: [facility];  Service: Urology;  Laterality: N/A;   HERNIA REPAIR Left    MOLE REMOVAL     From head , chest   UPPER GASTROINTESTINAL ENDOSCOPY      Family History: Family History  Problem Relation Age of Onset   Arrhythmia Mother    Anuerysm Father    Hypertension Father    Diabetes Father    Healthy Sister    Healthy Sister    Irritable bowel syndrome Daughter    Thyroid disease Daughter     Social History: Social History   Tobacco Use  Smoking Status Never  Smokeless Tobacco Never   Social History   Substance and Sexual Activity  Alcohol Use No   Social History   Substance and Sexual  Activity  Drug Use No    Allergies: No Known Allergies  Medications: Current Outpatient Medications  Medication Sig Dispense Refill   acetaminophen (TYLENOL) 650 MG CR tablet Take 650 mg by mouth every 8 (eight) hours as needed for pain.     alfuzosin (UROXATRAL) 10 MG 24 hr tablet TAKE 1 TABLET(10 MG) BY MOUTH AT BEDTIME 30 tablet 11   atorvastatin (LIPITOR) 20 MG tablet Take 20 mg by mouth daily.     cholecalciferol (VITAMIN D3) 25 MCG (1000 UT) tablet Take 1,000  Units by mouth daily.      esomeprazole (NEXIUM) 40 MG capsule Take 40 mg by mouth daily before breakfast.     finasteride (PROSCAR) 5 MG tablet TAKE 1 TABLET(5 MG) BY MOUTH DAILY 90 tablet 3   fluticasone (FLONASE) 50 MCG/ACT nasal spray Place 2 sprays into both nostrils daily as needed for allergies.     mesalamine (LIALDA) 1.2 g EC tablet Take 2 tablets (2.4 g total) by mouth 2 (two) times daily. 360 tablet 0   mirabegron ER (MYRBETRIQ) 25 MG TB24 tablet Take 1 tablet (25 mg total) by mouth daily. 30 tablet 1   Multiple Vitamins tablet Take 1 tablet by mouth daily. Multi vit with iron     Probiotic Product (ALIGN PO) Take 1 tablet by mouth daily.      silodosin (RAPAFLO) 8 MG CAPS capsule Take 1 capsule (8 mg total) by mouth daily with breakfast. 30 capsule 11   VYZULTA 0.024 % SOLN Place 1 drop into both eyes every evening.     No current facility-administered medications for this visit.    Review of Systems: GENERAL: negative for malaise, night sweats HEENT: No changes in hearing or vision, no nose bleeds or other nasal problems. NECK: Negative for lumps, goiter, pain and significant neck swelling RESPIRATORY: Negative for cough, wheezing CARDIOVASCULAR: Negative for chest pain, leg swelling, palpitations, orthopnea GI: SEE HPI MUSCULOSKELETAL: Negative for joint pain or swelling, back pain, and muscle pain. SKIN: Negative for lesions, rash PSYCH: Negative for sleep disturbance, mood disorder and recent psychosocial stressors. HEMATOLOGY Negative for prolonged bleeding, bruising easily, and swollen nodes. ENDOCRINE: Negative for cold or heat intolerance, polyuria, polydipsia and goiter. NEURO: negative for tremor, gait imbalance, syncope and seizures. The remainder of the review of systems is noncontributory.   Physical Exam: BP 133/73 (BP Location: Left Arm, Patient Position: Sitting, Cuff Size: Large)   Pulse 80   Temp 97.6 F (36.4 C) (Oral)   Ht 5\' 6"  (1.676 m)   Wt 188  lb 11.2 oz (85.6 kg)   BMI 30.46 kg/m  GENERAL: The patient is AO x3, in no acute distress. HEENT: Head is normocephalic and atraumatic. EOMI are intact. Mouth is well hydrated and without lesions. NECK: Supple. No masses LUNGS: Clear to auscultation. No presence of rhonchi/wheezing/rales. Adequate chest expansion HEART: RRR, normal s1 and s2. ABDOMEN: Soft, nontender, no guarding, no peritoneal signs, and nondistended. BS +. No masses. EXTREMITIES: Without any cyanosis, clubbing, rash, lesions or edema. NEUROLOGIC: AOx3, no focal motor deficit. SKIN: no jaundice, no rashes  Imaging/Labs: as above  I personally reviewed and interpreted the available labs, imaging and endoscopic files.  Impression and Plan: David Mooney is a 82 y.o. male with past medical history of glaucoma, arthritis, ulcerative colitis and GERD who presents for follow up of ulcerative colitis and GERD.  Patient has presented adequate control of his reflux symptoms with Nexium 40 mg every day.  We discussed the  appropriate fashion to take his medication as he may have further symptom control if he takes this prior to his breakfast.  He understood and agreed.  She will also implement other dietary and lifestyle changes.  His ulcerative colitis seems to be well-controlled with high-dose Lialda.  Has had very sporadic episodes of diarrhea that have responded to Imodium.  As his endoscopic findings were minimal, we will continue with this dose for now.  Will also obtain surveillance labs today  -Explained presumed etiology of reflux symptoms. Instruction provided in the use of antireflux medication - patient should take medication in the morning 30-45 minutes before eating breakfast. Discussed avoidance of eating within 2 hours of lying down to sleep and benefit of blocks to elevate head of bed. Also, will benefit from avoiding carbonated drinks/sodas or food that has tomatoes, spicy or greasy food. - Continue Nexium 40 mg  qday - Continue Lialda 2.4 g twice a day. -Check CBC and CMP  All questions were answered.      Katrinka Blazing, MD Gastroenterology and Hepatology [facility]

## 2022-05-17 NOTE — Patient Instructions (Signed)
Explained presumed etiology of reflux symptoms. Instruction provided in the use of antireflux medication - patient should take medication in the morning 30-45 minutes before eating breakfast. Discussed avoidance of eating within 2 hours of lying down to sleep and benefit of blocks to elevate head of bed. Also, will benefit from avoiding carbonated drinks/sodas or food that has tomatoes, spicy or greasy food. Continue Nexium 40 mg qday Continue Lialda 2.4 g twice a day.

## 2022-05-18 DIAGNOSIS — K5100 Ulcerative (chronic) pancolitis without complications: Secondary | ICD-10-CM | POA: Diagnosis not present

## 2022-05-19 LAB — COMPREHENSIVE METABOLIC PANEL
ALT: 22 IU/L (ref 0–44)
AST: 23 IU/L (ref 0–40)
Albumin/Globulin Ratio: 2 (ref 1.2–2.2)
Albumin: 4.3 g/dL (ref 3.7–4.7)
Alkaline Phosphatase: 65 IU/L (ref 44–121)
BUN/Creatinine Ratio: 12 (ref 10–24)
BUN: 11 mg/dL (ref 8–27)
Bilirubin Total: 0.3 mg/dL (ref 0.0–1.2)
CO2: 24 mmol/L (ref 20–29)
Calcium: 9.9 mg/dL (ref 8.6–10.2)
Chloride: 106 mmol/L (ref 96–106)
Creatinine, Ser: 0.93 mg/dL (ref 0.76–1.27)
Globulin, Total: 2.2 g/dL (ref 1.5–4.5)
Glucose: 99 mg/dL (ref 70–99)
Potassium: 4.6 mmol/L (ref 3.5–5.2)
Sodium: 145 mmol/L — ABNORMAL HIGH (ref 134–144)
Total Protein: 6.5 g/dL (ref 6.0–8.5)
eGFR: 82 mL/min/{1.73_m2} (ref >59)

## 2022-05-19 LAB — CBC WITH DIFFERENTIAL/PLATELET
Basophils Absolute: 0.1 10*3/uL (ref 0.0–0.2)
Basos: 1 %
EOS (ABSOLUTE): 0.3 10*3/uL (ref 0.0–0.4)
Eos: 4 %
Hematocrit: 37.1 % — ABNORMAL LOW (ref 37.5–51.0)
Hemoglobin: 12.2 g/dL — ABNORMAL LOW (ref 13.0–17.7)
Immature Grans (Abs): 0 10*3/uL (ref 0.0–0.1)
Immature Granulocytes: 0 %
Lymphocytes Absolute: 1.7 10*3/uL (ref 0.7–3.1)
Lymphs: 27 %
MCH: 29.1 pg (ref 26.6–33.0)
MCHC: 32.9 g/dL (ref 31.5–35.7)
MCV: 89 fL (ref 79–97)
Monocytes Absolute: 0.6 10*3/uL (ref 0.1–0.9)
Monocytes: 9 %
Neutrophils Absolute: 3.6 10*3/uL (ref 1.4–7.0)
Neutrophils: 59 %
Platelets: 144 10*3/uL — ABNORMAL LOW (ref 150–450)
RBC: 4.19 x10E6/uL (ref 4.14–5.80)
RDW: 14.4 % (ref 11.6–15.4)
WBC: 6.2 10*3/uL (ref 3.4–10.8)

## 2022-06-13 ENCOUNTER — Ambulatory Visit: Payer: Medicare Other | Admitting: Urology

## 2022-06-19 ENCOUNTER — Ambulatory Visit: Payer: Medicare Other | Admitting: Urology

## 2022-06-19 ENCOUNTER — Encounter: Payer: Self-pay | Admitting: Urology

## 2022-06-19 VITALS — BP 132/72 | HR 75 | Temp 97.8°F

## 2022-06-19 DIAGNOSIS — R3915 Urgency of urination: Secondary | ICD-10-CM

## 2022-06-19 DIAGNOSIS — Z09 Encounter for follow-up examination after completed treatment for conditions other than malignant neoplasm: Secondary | ICD-10-CM

## 2022-06-19 DIAGNOSIS — N401 Enlarged prostate with lower urinary tract symptoms: Secondary | ICD-10-CM

## 2022-06-19 DIAGNOSIS — N138 Other obstructive and reflux uropathy: Secondary | ICD-10-CM | POA: Diagnosis not present

## 2022-06-19 LAB — BLADDER SCAN AMB NON-IMAGING

## 2022-06-19 LAB — URINALYSIS, ROUTINE W REFLEX MICROSCOPIC
Bilirubin, UA: NEGATIVE
Glucose, UA: NEGATIVE
Ketones, UA: NEGATIVE
Nitrite, UA: NEGATIVE
Protein,UA: NEGATIVE
RBC, UA: NEGATIVE
Specific Gravity, UA: 1.025 (ref 1.005–1.030)
Urobilinogen, Ur: 0.2 mg/dL (ref 0.2–1.0)
pH, UA: 5.5 (ref 5.0–7.5)

## 2022-06-19 LAB — MICROSCOPIC EXAMINATION
Bacteria, UA: NONE SEEN
Epithelial Cells (non renal): NONE SEEN /hpf (ref 0–10)
RBC, Urine: NONE SEEN /hpf (ref 0–2)

## 2022-06-19 MED ORDER — MIRABEGRON ER 25 MG PO TB24
25.0000 mg | ORAL_TABLET | Freq: Every day | ORAL | 11 refills | Status: DC
Start: 2022-06-19 — End: 2023-07-15

## 2022-06-19 NOTE — Progress Notes (Signed)
Diagnoses: Post-operative state  HPI: David Mooney presents post-operatively. - GU / Urogyn History: 1. BPH. Taking Uroxatral, Flomax, Rapaflo, Myrbetriq 25 mg.  Today He presents s/p the following procedures on 04/30/2022 by Dr. Ronne Binning:  PREOPERATIVE DIAGNOSIS:  Benign prostatic hypertrophy with bladder outlet obstruction.   POSTOPERATIVE DIAGNOSIS:  Benign prostatic hypertrophy with bladder outlet obstruction.   PROCEDURE:  Cystoscopy with implantation of UroLift devices, 4 implants.   SPECIMEN:  None.   Postop course: Today He reports decreased urinary frequency and states that his urinary stream is stronger than prior to surgery. He denies increased urinary urgency, dysuria, gross hematuria, straining to void, or sensations of incomplete emptying.   Fall Screening: Do you usually have a device to assist in your mobility? No   Medications: Current Outpatient Medications  Medication Sig Dispense Refill   acetaminophen (TYLENOL) 650 MG CR tablet Take 650 mg by mouth every 8 (eight) hours as needed for pain.     alfuzosin (UROXATRAL) 10 MG 24 hr tablet TAKE 1 TABLET(10 MG) BY MOUTH AT BEDTIME 30 tablet 11   atorvastatin (LIPITOR) 20 MG tablet Take 20 mg by mouth daily.     cholecalciferol (VITAMIN D3) 25 MCG (1000 UT) tablet Take 1,000 Units by mouth daily.      esomeprazole (NEXIUM) 40 MG capsule Take 1 capsule (40 mg total) by mouth daily before breakfast. 90 capsule 3   finasteride (PROSCAR) 5 MG tablet TAKE 1 TABLET(5 MG) BY MOUTH DAILY 90 tablet 3   fluticasone (FLONASE) 50 MCG/ACT nasal spray Place 2 sprays into both nostrils daily as needed for allergies.     mesalamine (LIALDA) 1.2 g EC tablet Take 2 tablets (2.4 g total) by mouth 2 (two) times daily. 360 tablet 3   Multiple Vitamins tablet Take 1 tablet by mouth daily. Multi vit with iron     Probiotic Product (ALIGN PO) Take 1 tablet by mouth daily.      silodosin (RAPAFLO) 8 MG CAPS capsule Take 1 capsule (8  mg total) by mouth daily with breakfast. 30 capsule 11   tamsulosin (FLOMAX) 0.4 MG CAPS capsule Take 0.4 mg by mouth daily.     VYZULTA 0.024 % SOLN Place 1 drop into both eyes every evening.     mirabegron ER (MYRBETRIQ) 25 MG TB24 tablet Take 1 tablet (25 mg total) by mouth daily. 30 tablet 11   No current facility-administered medications for this visit.    Allergies: No Known Allergies  Past Medical History:  Diagnosis Date   Arthritis    Cataract    Complication of anesthesia    GERD (gastroesophageal reflux disease)    Glaucoma    Hernia of unspecified site of abdominal cavity without mention of obstruction or gangrene    History of gastric ulcer    PONV (postoperative nausea and vomiting)    Ulcerative colitis    Past Surgical History:  Procedure Laterality Date   BIOPSY  12/04/2018   Procedure: BIOPSY;  Surgeon: Malissa Hippo, MD;  [facility]: [facility];  Service: Endoscopy;;  cecum   COLONOSCOPY     COLONOSCOPY N/A 08/27/2012   Procedure: COLONOSCOPY;  Surgeon: Malissa Hippo, MD;  Location: [facility];  Service: Endoscopy;  Laterality: N/A;  1200-moved to 13:15 Ann to notify pt   COLONOSCOPY N/A 12/04/2018   Procedure: COLONOSCOPY;  Surgeon: Malissa Hippo, MD;  Location: [facility];  Service: Endoscopy;  Laterality: N/A;  155pm   CYSTOSCOPY WITH  INSERTION OF UROLIFT N/A 04/30/2022   Procedure: CYSTOSCOPY WITH INSERTION OF UROLIFT;  Surgeon: Malen Gauze, MD;  Location: [facility];  Service: Urology;  Laterality: N/A;   HERNIA REPAIR Left    MOLE REMOVAL     From head , chest   UPPER GASTROINTESTINAL ENDOSCOPY      Family History  Problem Relation Age of Onset   Arrhythmia Mother    Anuerysm Father    Hypertension Father    Diabetes Father    Healthy Sister    Healthy Sister    Irritable bowel syndrome Daughter    Thyroid disease Daughter    Social History   Socioeconomic History   Marital status: Married    Spouse name: Not on file    Number of children: Not on file   Years of education: Not on file   Highest education level: Not on file  Occupational History   Not on file  Tobacco Use   Smoking status: Never   Smokeless tobacco: Never  Vaping Use   Vaping Use: Never used  Substance and Sexual Activity   Alcohol use: No   Drug use: No   Sexual activity: Yes  Other Topics Concern   Not on file  Social History Narrative   Not on file   Social Determinants of Health   Financial Resource Strain: Not on file  Food Insecurity: Not on file  Transportation Needs: Not on file  Physical Activity: Not on file  Stress: Not on file  Social Connections: Not on file  Intimate Partner Violence: Not on file     SUBJECTIVE  Review of Systems Constitutional: Patient denies any unintentional weight loss or change in strength lntegumentary: Patient denies any rashes or pruritus Eyes: Patient denies dry eyes ENT: Patient denies dry mouth Cardiovascular: Patient denies chest pain or syncope Respiratory: Patient denies shortness of breath Gastrointestinal: Patient denies nausea, vomiting, constipation, or diarrhea Musculoskeletal: Patient denies muscle cramps or weakness Neurologic: Patient denies convulsions or seizures Psychiatric: Patient denies memory problems Allergic/Immunologic: Patient denies recent allergic reaction(s) Hematologic/Lymphatic: Patient denies bleeding tendencies Endocrine: Patient denies heat/cold intolerance  GU: As per HPI.  OBJECTIVE Vitals:   06/19/22 1327  BP: 132/72  Pulse: 75  Temp: 97.8 F (36.6 C)   There is no height or weight on file to calculate BMI.  Physical Examination  Constitutional: No obvious distress; patient is non-toxic appearing  Cardiovascular: No visible lower extremity edema.  Respiratory: The patient does not have audible wheezing/stridor; respirations do not appear labored  Gastrointestinal: Abdomen non-distended Musculoskeletal: Normal ROM of UEs   Neurologic: CN 2-12 grossly intact Psychiatric: Answered questions appropriately with normal affect   ASSESSMENT Benign prostatic hyperplasia with urinary obstruction - Plan: Urinalysis, Routine w reflex microscopic, Bladder Scan (Post Void Residual) in office  Postop check - Plan: Urinalysis, Routine w reflex microscopic, Bladder Scan (Post Void Residual) in office  Urgency of urination - Plan: mirabegron ER (MYRBETRIQ) 25 MG TB24 tablet  We reviewed the operative procedures and findings. Pre-operative symptoms are improved since the procedure. Refills sent for generic Mirabegron 25 mg daily.  Will plan for follow up in 6 months or sooner if needed. Pt verbalized understanding and agreement. All questions were answered.  PLAN Advised the following: Return in about 6 months (around 12/19/2022) for UA, PVR, & f/u with Evette Georges NP.  Orders Placed This Encounter  Procedures   Urinalysis, Routine w reflex microscopic   Bladder Scan (Post Void Residual) in office  It has been explained that the patient is to follow regularly with their PCP in addition to all other providers involved in their care and to follow instructions provided by these respective offices. Patient advised to contact urology clinic if any urologic-pertaining questions, concerns, new symptoms or problems arise in the interim period.  There are no Patient Instructions on file for this visit.  Electronically signed by:  Donnita Falls, MSN, FNP-C, CUNP 06/19/2022 2:39 PM

## 2022-07-26 DIAGNOSIS — K219 Gastro-esophageal reflux disease without esophagitis: Secondary | ICD-10-CM | POA: Diagnosis not present

## 2022-07-26 DIAGNOSIS — R7301 Impaired fasting glucose: Secondary | ICD-10-CM | POA: Diagnosis not present

## 2022-07-26 DIAGNOSIS — E7849 Other hyperlipidemia: Secondary | ICD-10-CM | POA: Diagnosis not present

## 2022-07-26 DIAGNOSIS — N183 Chronic kidney disease, stage 3 unspecified: Secondary | ICD-10-CM | POA: Diagnosis not present

## 2022-07-26 DIAGNOSIS — Z1329 Encounter for screening for other suspected endocrine disorder: Secondary | ICD-10-CM | POA: Diagnosis not present

## 2022-08-01 DIAGNOSIS — Z0001 Encounter for general adult medical examination with abnormal findings: Secondary | ICD-10-CM | POA: Diagnosis not present

## 2022-08-01 DIAGNOSIS — E7849 Other hyperlipidemia: Secondary | ICD-10-CM | POA: Diagnosis not present

## 2022-08-01 DIAGNOSIS — G4733 Obstructive sleep apnea (adult) (pediatric): Secondary | ICD-10-CM | POA: Diagnosis not present

## 2022-08-01 DIAGNOSIS — K5180 Other ulcerative colitis without complications: Secondary | ICD-10-CM | POA: Diagnosis not present

## 2022-08-01 DIAGNOSIS — R7301 Impaired fasting glucose: Secondary | ICD-10-CM | POA: Diagnosis not present

## 2022-09-04 DIAGNOSIS — K080 Exfoliation of teeth due to systemic causes: Secondary | ICD-10-CM | POA: Diagnosis not present

## 2022-09-19 DIAGNOSIS — H43813 Vitreous degeneration, bilateral: Secondary | ICD-10-CM | POA: Diagnosis not present

## 2022-09-19 DIAGNOSIS — H35343 Macular cyst, hole, or pseudohole, bilateral: Secondary | ICD-10-CM | POA: Diagnosis not present

## 2022-09-19 DIAGNOSIS — H35373 Puckering of macula, bilateral: Secondary | ICD-10-CM | POA: Diagnosis not present

## 2022-09-19 DIAGNOSIS — H401131 Primary open-angle glaucoma, bilateral, mild stage: Secondary | ICD-10-CM | POA: Diagnosis not present

## 2022-09-21 DIAGNOSIS — F4489 Other dissociative and conversion disorders: Secondary | ICD-10-CM | POA: Diagnosis not present

## 2022-09-21 DIAGNOSIS — R1084 Generalized abdominal pain: Secondary | ICD-10-CM | POA: Diagnosis not present

## 2022-09-21 DIAGNOSIS — K219 Gastro-esophageal reflux disease without esophagitis: Secondary | ICD-10-CM | POA: Diagnosis not present

## 2022-09-21 DIAGNOSIS — N218 Other lower urinary tract calculus: Secondary | ICD-10-CM | POA: Diagnosis not present

## 2022-09-21 DIAGNOSIS — R4781 Slurred speech: Secondary | ICD-10-CM | POA: Diagnosis not present

## 2022-09-21 DIAGNOSIS — R110 Nausea: Secondary | ICD-10-CM | POA: Diagnosis not present

## 2022-09-21 DIAGNOSIS — R1033 Periumbilical pain: Secondary | ICD-10-CM | POA: Diagnosis not present

## 2022-09-21 DIAGNOSIS — N281 Cyst of kidney, acquired: Secondary | ICD-10-CM | POA: Diagnosis not present

## 2022-09-21 DIAGNOSIS — N1330 Unspecified hydronephrosis: Secondary | ICD-10-CM | POA: Diagnosis not present

## 2022-09-21 DIAGNOSIS — I672 Cerebral atherosclerosis: Secondary | ICD-10-CM | POA: Diagnosis not present

## 2022-09-21 DIAGNOSIS — R29810 Facial weakness: Secondary | ICD-10-CM | POA: Diagnosis not present

## 2022-10-29 ENCOUNTER — Other Ambulatory Visit (INDEPENDENT_AMBULATORY_CARE_PROVIDER_SITE_OTHER): Payer: Self-pay | Admitting: Gastroenterology

## 2022-10-29 DIAGNOSIS — K5100 Ulcerative (chronic) pancolitis without complications: Secondary | ICD-10-CM

## 2022-10-30 NOTE — Telephone Encounter (Signed)
Duplicate. Sent to dr c this morning. Await response.

## 2022-10-30 NOTE — Telephone Encounter (Signed)
Last OV 05/17/22

## 2022-11-21 DIAGNOSIS — H04123 Dry eye syndrome of bilateral lacrimal glands: Secondary | ICD-10-CM | POA: Diagnosis not present

## 2022-12-11 ENCOUNTER — Other Ambulatory Visit: Payer: Self-pay | Admitting: Urology

## 2022-12-11 DIAGNOSIS — N200 Calculus of kidney: Secondary | ICD-10-CM

## 2022-12-11 NOTE — Progress Notes (Unsigned)
Name: David Mooney DOB: 01-13-1941 MRN: [id number]  History of Present Illness: David Mooney is a 82 y.o. male who presents today for follow up visit at [facility] [place]. - GU History: 1. BPH with BOO.  - ***Taking Uroxatral, Flomax, Rapaflo, Myrbetriq 25 mg. - 04/30/2022: Underwent Urolift procedure by Dr. Ronne Binning. 2. Kidney stones.   At last visit on 06/19/2022: Doing well.   Since last visit: > 09/21/2022: CT showed a 4 mm distal left ureteral stone with minimal left hydroureteronephrosis.   Today: KUB today: Awaiting radiology read; ***  He {Actions; denies-reports:120008} flank pain or abdominal pain. He {Actions; denies-reports:120008} fevers, nausea, or vomiting.  He {Actions; denies-reports:120008} increased urinary urgency, frequency, nocturia, dysuria, gross hematuria, hesitancy, straining to void, or sensations of incomplete emptying.   Fall Screening: Do you usually have a device to assist in your mobility? {yes/no:20286}  ***cane / ***walker / ***wheelchair  Medications: Current Outpatient Medications  Medication Sig Dispense Refill   acetaminophen (TYLENOL) 650 MG CR tablet Take 650 mg by mouth every 8 (eight) hours as needed for pain.     alfuzosin (UROXATRAL) 10 MG 24 hr tablet TAKE 1 TABLET(10 MG) BY MOUTH AT BEDTIME 30 tablet 11   atorvastatin (LIPITOR) 20 MG tablet Take 20 mg by mouth daily.     cholecalciferol (VITAMIN D3) 25 MCG (1000 UT) tablet Take 1,000 Units by mouth daily.      esomeprazole (NEXIUM) 40 MG capsule Take 1 capsule (40 mg total) by mouth daily before breakfast. 90 capsule 3   finasteride (PROSCAR) 5 MG tablet TAKE 1 TABLET(5 MG) BY MOUTH DAILY 90 tablet 3   fluticasone (FLONASE) 50 MCG/ACT nasal spray Place 2 sprays into both nostrils daily as needed for allergies.     mesalamine (LIALDA) 1.2 g EC tablet TAKE 2 TABLETS(2.4 GRAMS) BY MOUTH TWICE DAILY 360 tablet 3   mirabegron ER (MYRBETRIQ) 25 MG TB24 tablet Take 1 tablet  (25 mg total) by mouth daily. 30 tablet 11   Multiple Vitamins tablet Take 1 tablet by mouth daily. Multi vit with iron     Probiotic Product (ALIGN PO) Take 1 tablet by mouth daily.      silodosin (RAPAFLO) 8 MG CAPS capsule Take 1 capsule (8 mg total) by mouth daily with breakfast. 30 capsule 11   tamsulosin (FLOMAX) 0.4 MG CAPS capsule Take 0.4 mg by mouth daily.     VYZULTA 0.024 % SOLN Place 1 drop into both eyes every evening.     No current facility-administered medications for this visit.    Allergies: No Known Allergies  Past Medical History:  Diagnosis Date   Arthritis    Cataract    Complication of anesthesia    GERD (gastroesophageal reflux disease)    Glaucoma    Hernia of unspecified site of abdominal cavity without mention of obstruction or gangrene    History of gastric ulcer    PONV (postoperative nausea and vomiting)    Ulcerative colitis    Past Surgical History:  Procedure Laterality Date   BIOPSY  12/04/2018   Procedure: BIOPSY;  Surgeon: Malissa Hippo, MD;  [facility];  Service: Endoscopy;;  cecum   COLONOSCOPY     COLONOSCOPY N/A 08/27/2012   Procedure: COLONOSCOPY;  Surgeon: Malissa Hippo, MD;  [facility];  Service: Endoscopy;  Laterality: N/A;  1200-moved to 13:15 Ann to notify pt   COLONOSCOPY N/A 12/04/2018   Procedure: COLONOSCOPY;  Surgeon: Lionel December  U, MD;  Location: [facility];  Service: Endoscopy;  Laterality: N/A;  155pm   CYSTOSCOPY WITH INSERTION OF UROLIFT N/A 04/30/2022   Procedure: CYSTOSCOPY WITH INSERTION OF UROLIFT;  Surgeon: Malen Gauze, MD;  Location: [facility];  Service: Urology;  Laterality: N/A;   HERNIA REPAIR Left    MOLE REMOVAL     From head , chest   UPPER GASTROINTESTINAL ENDOSCOPY     Family History  Problem Relation Age of Onset   Arrhythmia Mother    Anuerysm Father    Hypertension Father    Diabetes Father    Healthy Sister    Healthy Sister    Irritable bowel syndrome  Daughter    Thyroid disease Daughter    Social History   Socioeconomic History   Marital status: Married    Spouse name: Not on file   Number of children: Not on file   Years of education: Not on file   Highest education level: Not on file  Occupational History   Not on file  Tobacco Use   Smoking status: Never   Smokeless tobacco: Never  Vaping Use   Vaping status: Never Used  Substance and Sexual Activity   Alcohol use: No   Drug use: No   Sexual activity: Yes  Other Topics Concern   Not on file  Social History Narrative   Not on file   Social Determinants of Health   Financial Resource Strain: Not on file  Food Insecurity: Not on file  Transportation Needs: Not on file  Physical Activity: Not on file  Stress: Not on file  Social Connections: Not on file  Intimate Partner Violence: Not on file    SUBJECTIVE  Review of Systems*** Constitutional: Patient denies any unintentional weight loss or change in strength lntegumentary: Patient denies any rashes or pruritus Cardiovascular: Patient denies chest pain or syncope Respiratory: Patient denies shortness of breath Gastrointestinal: Patient ***denies nausea, vomiting, constipation, or diarrhea Musculoskeletal: Patient denies muscle cramps or weakness Neurologic: Patient denies convulsions or seizures Allergic/Immunologic: Patient denies recent allergic reaction(s) Hematologic/Lymphatic: Patient denies bleeding tendencies Endocrine: Patient denies heat/cold intolerance  GU: As per HPI.  OBJECTIVE There were no vitals filed for this visit. There is no height or weight on file to calculate BMI.  Physical Examination*** Constitutional: No obvious distress; patient is non-toxic appearing  Cardiovascular: No visible lower extremity edema.  Respiratory: The patient does not have audible wheezing/stridor; respirations do not appear labored  Gastrointestinal: Abdomen non-distended Musculoskeletal: Normal ROM of UEs   Skin: No obvious rashes/open sores  Neurologic: CN 2-12 grossly intact Psychiatric: Answered questions appropriately with normal affect  Hematologic/Lymphatic/Immunologic: No obvious bruises or sites of spontaneous bleeding  UA: ***negative *** WBC/hpf, *** RBC/hpf, *** bacteria ***with no evidence of UTI ***with no evidence of microscopic hematuria PVR: *** ml  ASSESSMENT No diagnosis found.  ***We reviewed recent imaging results; ***awaiting radiology results, appears to have ***no acute findings.  ***For stone prevention: Advised adequate hydration and we discussed option to consider low oxalate diet given that calcium oxalate is the most common type of stone. Handout provided about stone prevention diet.  ***For recurrent stone formers: We discussed option to proceed with 24 hour urinalysis ([facility]) for metabolic evaluation, which may help with targeted recommendations for dietary I medication therapies for stone prevention. Patient elected to ***proceed/ ***hold off.  Will plan to follow up in ***6 months / ***1 year with ***KUB ***RUS for stone surveillance or sooner if needed.  Pt verbalized understanding and agreement. All questions were answered.  PLAN Advised the following: Maintain adequate fluid intake daily. Drink citrus juice (lemon, lime or orange juice) routinely. Low oxalate diet. No follow-ups on file.  No orders of the defined types were placed in this encounter.   It has been explained that the patient is to follow regularly with their PCP in addition to all other providers involved in their care and to follow instructions provided by these respective offices. Patient advised to contact urology clinic if any urologic-pertaining questions, concerns, new symptoms or problems arise in the interim period.  There are no Patient Instructions on file for this visit.  Electronically signed by:  Donnita Falls, MSN, FNP-C, CUNP 12/11/2022 11:59 AM

## 2022-12-16 DIAGNOSIS — I749 Embolism and thrombosis of unspecified artery: Secondary | ICD-10-CM

## 2022-12-16 HISTORY — DX: Embolism and thrombosis of unspecified artery: I74.9

## 2022-12-17 ENCOUNTER — Ambulatory Visit: Payer: Medicare Other | Admitting: Urology

## 2022-12-17 DIAGNOSIS — N201 Calculus of ureter: Secondary | ICD-10-CM

## 2022-12-17 DIAGNOSIS — N138 Other obstructive and reflux uropathy: Secondary | ICD-10-CM

## 2022-12-17 DIAGNOSIS — N200 Calculus of kidney: Secondary | ICD-10-CM

## 2022-12-17 DIAGNOSIS — R3915 Urgency of urination: Secondary | ICD-10-CM

## 2022-12-17 DIAGNOSIS — R351 Nocturia: Secondary | ICD-10-CM

## 2022-12-31 ENCOUNTER — Emergency Department (HOSPITAL_COMMUNITY): Payer: Medicare Other

## 2022-12-31 ENCOUNTER — Encounter (HOSPITAL_COMMUNITY): Payer: Self-pay

## 2022-12-31 ENCOUNTER — Emergency Department (HOSPITAL_COMMUNITY)
Admission: EM | Admit: 2022-12-31 | Discharge: 2023-01-01 | Disposition: A | Discharge status: Home / Self Care | Payer: Medicare Other | Attending: Emergency Medicine | Admitting: Emergency Medicine

## 2022-12-31 DIAGNOSIS — K219 Gastro-esophageal reflux disease without esophagitis: Secondary | ICD-10-CM | POA: Diagnosis not present

## 2022-12-31 DIAGNOSIS — R1084 Generalized abdominal pain: Secondary | ICD-10-CM | POA: Insufficient documentation

## 2022-12-31 DIAGNOSIS — N281 Cyst of kidney, acquired: Secondary | ICD-10-CM | POA: Diagnosis not present

## 2022-12-31 DIAGNOSIS — R197 Diarrhea, unspecified: Secondary | ICD-10-CM | POA: Diagnosis not present

## 2022-12-31 DIAGNOSIS — E860 Dehydration: Secondary | ICD-10-CM | POA: Diagnosis not present

## 2022-12-31 DIAGNOSIS — R112 Nausea with vomiting, unspecified: Secondary | ICD-10-CM | POA: Diagnosis not present

## 2022-12-31 DIAGNOSIS — R109 Unspecified abdominal pain: Secondary | ICD-10-CM | POA: Diagnosis not present

## 2022-12-31 DIAGNOSIS — K429 Umbilical hernia without obstruction or gangrene: Secondary | ICD-10-CM | POA: Diagnosis not present

## 2022-12-31 DIAGNOSIS — Q438 Other specified congenital malformations of intestine: Secondary | ICD-10-CM | POA: Diagnosis not present

## 2022-12-31 DIAGNOSIS — K8689 Other specified diseases of pancreas: Secondary | ICD-10-CM | POA: Diagnosis not present

## 2022-12-31 LAB — CBC
HCT: 39.9 % (ref 39.0–52.0)
Hemoglobin: 13.8 g/dL (ref 13.0–17.0)
MCH: 30.5 pg (ref 26.0–34.0)
MCHC: 34.6 g/dL (ref 30.0–36.0)
MCV: 88.3 fL (ref 80.0–100.0)
Platelets: 267 10*3/uL (ref 150–400)
RBC: 4.52 MIL/uL (ref 4.22–5.81)
RDW: 12.5 % (ref 11.5–15.5)
WBC: 13.3 10*3/uL — ABNORMAL HIGH (ref 4.0–10.5)
nRBC: 0 % (ref 0.0–0.2)

## 2022-12-31 LAB — URINALYSIS, ROUTINE W REFLEX MICROSCOPIC
Bilirubin Urine: NEGATIVE
Glucose, UA: NEGATIVE mg/dL
Ketones, ur: NEGATIVE mg/dL
Leukocytes,Ua: NEGATIVE
Nitrite: NEGATIVE
Protein, ur: NEGATIVE mg/dL
Specific Gravity, Urine: >1.046 — ABNORMAL HIGH (ref 1.005–1.030)
pH: 6 (ref 5.0–8.0)

## 2022-12-31 LAB — COMPREHENSIVE METABOLIC PANEL
ALT: 17 U/L (ref 0–44)
AST: 17 U/L (ref 15–41)
Albumin: 4 g/dL (ref 3.5–5.0)
Alkaline Phosphatase: 47 U/L (ref 38–126)
Anion gap: 12 (ref 5–15)
BUN: 24 mg/dL — ABNORMAL HIGH (ref 8–23)
CO2: 23 mmol/L (ref 22–32)
Calcium: 10.6 mg/dL — ABNORMAL HIGH (ref 8.9–10.3)
Chloride: 101 mmol/L (ref 98–111)
Creatinine, Ser: 1 mg/dL (ref 0.61–1.24)
GFR, Estimated: >60 mL/min (ref >60)
Glucose, Bld: 137 mg/dL — ABNORMAL HIGH (ref 70–99)
Potassium: 3.6 mmol/L (ref 3.5–5.1)
Sodium: 136 mmol/L (ref 135–145)
Total Bilirubin: 0.5 mg/dL (ref <1.2)
Total Protein: 7.6 g/dL (ref 6.5–8.1)

## 2022-12-31 LAB — LIPASE, BLOOD: Lipase: 38 U/L (ref 11–51)

## 2022-12-31 MED ORDER — ESOMEPRAZOLE MAGNESIUM 40 MG PO CPDR
40.0000 mg | DELAYED_RELEASE_CAPSULE | Freq: Every day | ORAL | 3 refills | Status: DC
Start: 2022-12-31 — End: 2023-02-18

## 2022-12-31 MED ORDER — SODIUM CHLORIDE 0.9 % IV BOLUS
1000.0000 mL | Freq: Once | INTRAVENOUS | Status: AC
  Administered 2022-12-31: 1000 mL via INTRAVENOUS

## 2022-12-31 MED ORDER — IOHEXOL 300 MG/ML  SOLN
100.0000 mL | Freq: Once | INTRAMUSCULAR | Status: AC | PRN
  Administered 2022-12-31: 100 mL via INTRAVENOUS

## 2022-12-31 MED ORDER — ONDANSETRON 4 MG PO TBDP: 4.0000 mg | ORAL_TABLET | Freq: Three times a day (TID) | ORAL | 0 refills | Status: DC | PRN

## 2022-12-31 MED ORDER — SODIUM CHLORIDE 0.9 % IV BOLUS
500.0000 mL | Freq: Once | INTRAVENOUS | Status: AC
  Administered 2022-12-31: 500 mL via INTRAVENOUS

## 2022-12-31 NOTE — ED Triage Notes (Signed)
Pt c/o generalized abdominal pain and n/v x5 days.  Pain score 5/10.

## 2022-12-31 NOTE — Discharge Instructions (Signed)
Use Zofran for any nausea as prescribed. Push fluids and advance diet as tolerated.   Follow up with your doctor for recheck this week to ensure you are improving. Return to the ED with any new or worsening symptoms.   It is recommended that you take Nexium (esomeprazole) daily. You have been given a written prescription in case you have no more at home.

## 2022-12-31 NOTE — ED Notes (Addendum)
Patient transported to CT 

## 2022-12-31 NOTE — ED Provider Triage Note (Deleted)
Emergency Medicine Provider Triage Evaluation Note  David Mooney , a 82 y.o. male  was evaluated in triage.  Pt complains of left arm pain.  Review of Systems  Positive: Left arm ache starts at paracervical spine x 1 day Negative: Chest pain, SOB, weakness, swelling  Physical Exam  BP 130/76 (BP Location: Right Arm)   Pulse 79   Temp 98.8 F (37.1 C) (Oral)   Ht 5\' 6"  (1.676 m)   Wt 85.3 kg   SpO2 98%   BMI 30.34 kg/m  Gen:   Awake, no distress   Resp:  Normal effort  MSK:   Moves extremities without difficulty Full strength left UE, no swelling or redness. +distal pulses. Tender to left paracervical neck Other:    Medical Decision Making  Medically screening exam initiated at 5:14 PM.  Appropriate orders placed.  David Mooney was informed that the remainder of the evaluation will be completed by another provider, this initial triage assessment does not replace that evaluation, and the importance of remaining in the ED until their evaluation is complete.  Pain since yesterday starting in left elbow, then extending to left shoulder and side of neck. No chest pain. No history of radiculopathy. No weakness.    Elpidio Anis, PA-C 12/31/22 4742

## 2022-12-31 NOTE — ED Provider Triage Note (Signed)
Emergency Medicine Provider Triage Evaluation Note  David Mooney , a 82 y.o. male  was evaluated in triage.  Pt complains of progressive weakness.  Review of Systems  Positive: Lack of appetite, sleeping, vomiting Negative: Fever, cough, congestion  Physical Exam  BP 130/76 (BP Location: Right Arm)   Pulse 79   Temp 98.8 F (37.1 C) (Oral)   Ht 5\' 6"  (1.676 m)   Wt 85.3 kg   SpO2 98%   BMI 30.34 kg/m  Gen:   Awake, no distress   Resp:  Normal effort  MSK:   Moves extremities without difficulty  Other:    Medical Decision Making  Medically screening exam initiated at 5:17 PM.  Appropriate orders placed.  David Mooney was informed that the remainder of the evaluation will be completed by another provider, this initial triage assessment does not replace that evaluation, and the importance of remaining in the ED until their evaluation is complete.  Here on advice of PCP for increased fatigue and sleeping, not eating or drinking over several days. Concern for dehydration. He reports pain across abdomen x 2-3 days.    Elpidio Anis, PA-C 12/31/22 1718

## 2022-12-31 NOTE — ED Provider Notes (Signed)
[place] [facility]ERGENCY DEPARTMENT AT [facility] Provider Note   CSN: [id number] Arrival date & time: 12/31/22  1607     History  Chief Complaint  Patient presents with   Abdominal Pain   Emesis    David Mooney is a 82 y.o. male.  Patient BIB family with progressively worsening symptoms of abdominal pain, vomiting, not eating or drinking for the past 2-3 days. He reports he has been sleeping most of the day as well. Sent by PCP for concern for dehydration. He reports normal urination without dysuria. No fever.   The history is provided by the patient. No language interpreter was used.  Abdominal Pain Associated symptoms: vomiting   Emesis Associated symptoms: abdominal pain        Home Medications Prior to Admission medications   Medication Sig Start Date End Date Taking? Authorizing Provider  ondansetron (ZOFRAN-ODT) 4 MG disintegrating tablet Take 1 tablet (4 mg total) by mouth every 8 (eight) hours as needed for nausea or vomiting. 12/31/22  Yes Delancey Moraes, Melvenia Beam, PA-C  acetaminophen (TYLENOL) 650 MG CR tablet Take 650 mg by mouth every 8 (eight) hours as needed for pain.    [provider]  alfuzosin (UROXATRAL) 10 MG 24 hr tablet TAKE 1 TABLET(10 MG) BY MOUTH AT BEDTIME 03/08/22   McKenzie, Mardene Celeste, MD  atorvastatin (LIPITOR) 20 MG tablet Take 20 mg by mouth daily. 01/16/22   [provider]  cholecalciferol (VITAMIN D3) 25 MCG (1000 UT) tablet Take 1,000 Units by mouth daily.     [provider]  ciprofloxacin (CIPRO) 500 MG tablet Take 500 mg by mouth 2 (two) times daily. 12/31/22   [provider]  esomeprazole (NEXIUM) 40 MG capsule Take 1 capsule (40 mg total) by mouth daily before breakfast. 12/31/22   Elpidio Anis, PA-C  esomeprazole (NEXIUM) 40 MG capsule Take 1 capsule (40 mg total) by mouth 2 (two) times daily before a meal. 01/02/23   Gelene Mink, NP  finasteride (PROSCAR) 5 MG tablet TAKE 1 TABLET(5 MG) BY MOUTH  DAILY 02/26/22   McKenzie, Mardene Celeste, MD  fluticasone (FLONASE) 50 MCG/ACT nasal spray Place 2 sprays into both nostrils daily as needed for allergies.    [provider]  mesalamine (LIALDA) 1.2 g EC tablet TAKE 2 TABLETS(2.4 GRAMS) BY MOUTH TWICE DAILY 10/30/22   Marguerita Merles, Reuel Boom, MD  mirabegron ER (MYRBETRIQ) 25 MG TB24 tablet Take 1 tablet (25 mg total) by mouth daily. 06/19/22   Donnita Falls, FNP  Multiple Vitamins tablet Take 1 tablet by mouth daily. Multi vit with iron    [provider]  Probiotic Product (ALIGN PO) Take 1 tablet by mouth daily.     [provider]  silodosin (RAPAFLO) 8 MG CAPS capsule Take 1 capsule (8 mg total) by mouth daily with breakfast. 02/09/22   McKenzie, Mardene Celeste, MD  tamsulosin (FLOMAX) 0.4 MG CAPS capsule Take 0.4 mg by mouth daily. 04/26/22   [provider]  VYZULTA 0.024 % SOLN Place 1 drop into both eyes every evening. 04/20/22   [provider]      Allergies    Patient has no known allergies.    Review of Systems   Review of Systems  Gastrointestinal:  Positive for abdominal pain and vomiting.    Physical Exam Updated Vital Signs BP (!) 173/89   Pulse 64   Temp 98.6 F (37 C) (Oral)   Resp 18   Ht 5\' 6"  (  1.676 m)   Wt 85.3 kg   SpO2 98%   BMI 30.34 kg/m  Physical Exam  ED Results / Procedures / Treatments   Labs (all labs ordered are listed, but only abnormal results are displayed) Labs Reviewed  COMPREHENSIVE METABOLIC PANEL - Abnormal; Notable for the following components:      Result Value   Glucose, Bld 137 (*)    BUN 24 (*)    Calcium 10.6 (*)    All other components within normal limits  CBC - Abnormal; Notable for the following components:   WBC 13.3 (*)    All other components within normal limits  URINALYSIS, ROUTINE W REFLEX MICROSCOPIC - Abnormal; Notable for the following components:   Specific Gravity, Urine >1.046 (*)    Hgb urine dipstick SMALL (*)     Bacteria, UA RARE (*)    All other components within normal limits  LIPASE, BLOOD    EKG None  Radiology DG Chest Port 1 View Result Date: 01/04/2023 CLINICAL DATA:  Pain. EXAM: PORTABLE CHEST 1 VIEW COMPARISON:  01/02/2018 FINDINGS: The lungs are clear without focal pneumonia, edema, pneumothorax or pleural effusion. Interstitial markings are diffusely coarsened with chronic features. Cardiopericardial silhouette is at upper limits of normal for size. No acute bony abnormality. Telemetry leads overlie the chest. IMPRESSION: Chronic interstitial coarsening without acute cardiopulmonary findings. Electronically Signed   By: Kennith Center M.D.   On: 01/04/2023 12:58    Procedures Procedures    Medications Ordered in ED Medications  sodium chloride 0.9 % bolus 500 mL (0 mLs Intravenous Stopped 12/31/22 2023)  iohexol (OMNIPAQUE) 300 MG/ML solution 100 mL (100 mLs Intravenous Contrast Given 12/31/22 1926)  sodium chloride 0.9 % bolus 1,000 mL (0 mLs Intravenous Stopped 12/31/22 2353)    ED Course/ Medical Decision Making/ A&P                                 Medical Decision Making This patient presents to the ED for concern of abdominal pain, this involves an extensive number of treatment options, and is a complaint that carries with it a high risk of complications and morbidity.  The differential diagnosis includes SBO/LBO, ischemic bowel, gastroenteritis/colitis, PUD, infection   Co morbidities that complicate the patient evaluation  Ulcerative colitis, GERD, remote history of gastric ulcer   Additional history obtained:  Additional history and/or information obtained from chart review, notable for EMR for GI note review   Lab Tests:  I Ordered, and personally interpreted labs.  The pertinent results include:  WBC 13.3; urine with concentrated sp gr 1.046, no infection    Imaging Studies ordered:  I ordered imaging studies including CT abd/pel Per radiologist  interpretation;   IMPRESSION: 1. Wall thickening about the pyloric region with adjacent fat stranding, suspicious for peptic ulcer disease. No perforation or free air. 2. Mild wall thickening about the inferior bladder base, may be reactive or secondary to cystitis. Recommend correlation with urinalysis. 3. Hepatic steatosis.       Cardiac Monitoring:  The patient was maintained on a cardiac monitor.  I personally viewed and interpreted the cardiac monitored which showed an underlying rhythm of: No tachycardia   Problem List / ED Course:  Patient with abdominal pain, PCP stated concern for dehydration Urine is concentrated - IV fluids provided. No infection.  Mild leukocytosis of 13.3, VSS, afebrile CT abd/pel: ?PUD, ?cystitis      Social  Determinants of Health:  Good family support   Disposition:  After consideration of the diagnostic results and the patients response to treatment, I feel that the patient would benefit from appropriate for discharge home.  Patient discussed with Dr. Deretha Emory and CT results reviewed. Patient on nexium already in consideration of ulcer disease. This was discussed with family who were not sure of their supply, so written prescription was provided in the event they need further medication. He will need GI follow up but stable for discharge home.    Amount and/or Complexity of Data Reviewed Labs: ordered. Radiology: ordered.  Risk Prescription drug management.           Final Clinical Impression(s) / ED Diagnoses Final diagnoses:  Nausea vomiting and diarrhea  Generalized abdominal pain  Dehydration    Rx / DC Orders ED Discharge Orders          Ordered    esomeprazole (NEXIUM) 40 MG capsule  Daily before breakfast        12/31/22 2230    ondansetron (ZOFRAN-ODT) 4 MG disintegrating tablet  Every 8 hours PRN        12/31/22 2230              Elpidio Anis, PA-C 01/05/23 0001    Vanetta Mulders,  MD 01/11/23 1515

## 2022-12-31 NOTE — ED Notes (Signed)
Pt unable to provide urine sample at this time 

## 2023-01-01 NOTE — ED Notes (Signed)
Patient verbalizes understanding of discharge instructions. Opportunity for questioning and answers were provided. Armband removed by staff, pt discharged from ED. Wheeled out to car, driven home by daughter

## 2023-01-02 ENCOUNTER — Encounter: Payer: Self-pay | Admitting: Gastroenterology

## 2023-01-02 ENCOUNTER — Ambulatory Visit (INDEPENDENT_AMBULATORY_CARE_PROVIDER_SITE_OTHER): Payer: Medicare Other | Admitting: Gastroenterology

## 2023-01-02 VITALS — BP 135/81 | HR 94 | Temp 98.4°F | Ht 66.0 in | Wt 179.7 lb

## 2023-01-02 DIAGNOSIS — R935 Abnormal findings on diagnostic imaging of other abdominal regions, including retroperitoneum: Secondary | ICD-10-CM | POA: Insufficient documentation

## 2023-01-02 DIAGNOSIS — R112 Nausea with vomiting, unspecified: Secondary | ICD-10-CM | POA: Insufficient documentation

## 2023-01-02 DIAGNOSIS — R9431 Abnormal electrocardiogram [ECG] [EKG]: Secondary | ICD-10-CM | POA: Diagnosis not present

## 2023-01-02 DIAGNOSIS — R1013 Epigastric pain: Secondary | ICD-10-CM | POA: Diagnosis not present

## 2023-01-02 DIAGNOSIS — K5190 Ulcerative colitis, unspecified, without complications: Secondary | ICD-10-CM

## 2023-01-02 DIAGNOSIS — R109 Unspecified abdominal pain: Secondary | ICD-10-CM

## 2023-01-02 DIAGNOSIS — K209 Esophagitis, unspecified without bleeding: Secondary | ICD-10-CM | POA: Diagnosis not present

## 2023-01-02 MED ORDER — ESOMEPRAZOLE MAGNESIUM 40 MG PO CPDR: 40.0000 mg | DELAYED_RELEASE_CAPSULE | Freq: Two times a day (BID) | ORAL | 5 refills | Status: DC

## 2023-01-02 NOTE — Progress Notes (Signed)
Gastroenterology Office Note     Primary Care Physician:  Juliette Alcide, MD  Primary Gastroenterologist: Dr. Levon Hedger   Chief Complaint   Chief Complaint  Patient presents with   Follow-up    Pt arrives for follow up. Pt was in [facility] for Nausea/vomiting and diarrhea. Pt states he is still having vomiting. Still having nausea also. Pt has burning sensation in stomach when first waking up. Pt also states that his stomach will have to settle in the mornings. Zofran Rx sent by [facility]. Paper script taken to pharmacy for Nexium      History of Present Illness   David Mooney is an 82 y.o. male presenting today with a history of chronic UC, GERD, last seen in May 2024. Currently on Lialda. He is here for early interval follow-up due to new onset nausea, vomiting, and abdominal pain.    Reported history of gastric ulcer over 20/30 years ago.   CT abd/pelvis with contrast 12/31/22 with wall thickening about the pyloric region with adjacent fat stranding, suspicious for peptic ulcer disease. Possible cystitis. Hepatic steatosis. Prescribed Cipro.   Nausea and vomiting for the past week, was having epigastric pain even prior to this. Having worsening GERD symptoms, solid food dysphagia, pain with swallowing. About 10 lbs weight loss since April 2024. Very small amounts of oral intake. Noets early satiety. Small amounts of food. Soft foods. Was having diarrhea recently but back to normal. Just tylenol products.    Last EGD: many years ago, cannot remember exactly when Last Colonoscopy: 12/04/2018 Normal terminal ileum Area of mildly congested mucosa in the cecum which was biopsied (mildly active chronic colitis), multiple scars in the colon related to healed colitis, 2 small polyps in the splenic flexure.  Pathology consistent with tubular adenomas. Recommended repeat colonoscopy in 5 years.  Past Medical History:  Diagnosis Date   Arthritis    Cataract     Complication of anesthesia    GERD (gastroesophageal reflux disease)    Glaucoma    Hernia of unspecified site of abdominal cavity without mention of obstruction or gangrene    History of gastric ulcer    PONV (postoperative nausea and vomiting)    Ulcerative colitis     Past Surgical History:  Procedure Laterality Date   BIOPSY  12/04/2018   Procedure: BIOPSY;  Surgeon: Malissa Hippo, MD;  Location: [facility];  Service: Endoscopy;;  cecum   COLONOSCOPY     COLONOSCOPY N/A 08/27/2012   Procedure: COLONOSCOPY;  Surgeon: Malissa Hippo, MD;  Location: [facility];  Service: Endoscopy;  Laterality: N/A;  1200-moved to 13:15 Ann to notify pt   COLONOSCOPY N/A 12/04/2018   Procedure: COLONOSCOPY;  Surgeon: Malissa Hippo, MD;  Location: [facility];  Service: Endoscopy;  Laterality: N/A;  155pm   CYSTOSCOPY WITH INSERTION OF UROLIFT N/A 04/30/2022   Procedure: CYSTOSCOPY WITH INSERTION OF UROLIFT;  Surgeon: Malen Gauze, MD;  Location: [facility];  Service: Urology;  Laterality: N/A;   HERNIA REPAIR Left    MOLE REMOVAL     From head , chest   UPPER GASTROINTESTINAL ENDOSCOPY      Current Outpatient Medications  Medication Sig Dispense Refill   acetaminophen (TYLENOL) 650 MG CR tablet Take 650 mg by mouth every 8 (eight) hours as needed for pain.     alfuzosin (UROXATRAL) 10 MG 24 hr tablet TAKE 1 TABLET(10 MG) BY MOUTH  AT BEDTIME 30 tablet 11   atorvastatin (LIPITOR) 20 MG tablet Take 20 mg by mouth daily.     cholecalciferol (VITAMIN D3) 25 MCG (1000 UT) tablet Take 1,000 Units by mouth daily.      esomeprazole (NEXIUM) 40 MG capsule Take 1 capsule (40 mg total) by mouth daily before breakfast. 90 capsule 3   finasteride (PROSCAR) 5 MG tablet TAKE 1 TABLET(5 MG) BY MOUTH DAILY 90 tablet 3   fluticasone (FLONASE) 50 MCG/ACT nasal spray Place 2 sprays into both nostrils daily as needed for allergies.     mesalamine (LIALDA) 1.2 g EC tablet TAKE 2 TABLETS(2.4 GRAMS) BY  MOUTH TWICE DAILY 360 tablet 3   mirabegron ER (MYRBETRIQ) 25 MG TB24 tablet Take 1 tablet (25 mg total) by mouth daily. 30 tablet 11   Multiple Vitamins tablet Take 1 tablet by mouth daily. Multi vit with iron     ondansetron (ZOFRAN-ODT) 4 MG disintegrating tablet Take 1 tablet (4 mg total) by mouth every 8 (eight) hours as needed for nausea or vomiting. 20 tablet 0   Probiotic Product (ALIGN PO) Take 1 tablet by mouth daily.      silodosin (RAPAFLO) 8 MG CAPS capsule Take 1 capsule (8 mg total) by mouth daily with breakfast. 30 capsule 11   tamsulosin (FLOMAX) 0.4 MG CAPS capsule Take 0.4 mg by mouth daily.     VYZULTA 0.024 % SOLN Place 1 drop into both eyes every evening.     No current facility-administered medications for this visit.    Allergies as of 01/02/2023   (No Known Allergies)    Family History  Problem Relation Age of Onset   Arrhythmia Mother    Anuerysm Father    Hypertension Father    Diabetes Father    Healthy Sister    Healthy Sister    Irritable bowel syndrome Daughter    Thyroid disease Daughter     Social History   Socioeconomic History   Marital status: Married    Spouse name: Not on file   Number of children: Not on file   Years of education: Not on file   Highest education level: Not on file  Occupational History   Not on file  Tobacco Use   Smoking status: Never   Smokeless tobacco: Never  Vaping Use   Vaping status: Never Used  Substance and Sexual Activity   Alcohol use: No   Drug use: No   Sexual activity: Yes  Other Topics Concern   Not on file  Social History Narrative   Not on file   Social Drivers of Health   Financial Resource Strain: Not on file  Food Insecurity: Not on file  Transportation Needs: Not on file  Physical Activity: Not on file  Stress: Not on file  Social Connections: Not on file  Intimate Partner Violence: Not on file     Review of Systems  See HPI   Physical Exam   BP 135/81   Pulse 94   Temp  98.4 F (36.9 C)   Ht 5\' 6"  (1.676 m)   Wt 179 lb 11.2 oz (81.5 kg)   BMI 29.00 kg/m  General:   Alert and oriented. Flat affect, appears fatigued, but family states this is his baseline.  Head:  Normocephalic and atraumatic. Eyes:  Without icterus Abdomen:  +BS, soft, non-tender and non-distended. No HSM noted. No guarding or rebound. No masses appreciated.  Rectal:  Deferred  Msk:  Symmetrical without gross deformities.  Normal posture. Extremities:  Without edema. Neurologic:  Alert and  oriented x4;  grossly normal neurologically. Skin:  Intact without significant lesions or rashes. Psych:  Alert and cooperative. Normal mood and affect.   Assessment   David Mooney is a an 82 y.o. male presenting today with a history of chronic UC well-controlled on Lialda, GERD, last seen in May 2024, now evaluated in early follow-up due to new onset abdominal pain, nausea and vomiting.  ED presentation recently with wall thickening about the pylorus and concern for PUD. His last EGD was at least 20-30 years ago. Solid food dysphagia, odynophagia, and weight loss are concerning. We will need to pursue ASAP EGD. I discussed signs/symptoms that would prompt need for ED evaluation.   UC: can discuss management at next visit. No changes currently. Continue Lialda.   PLAN    Increase Nexium to BID Increase hydration Swallowing precautions ASAP EGD with Dr. Marletta Lor to be performed in Dr. Wilburt Finlay absence.    Gelene Mink, PhD, ANP-BC [facility]

## 2023-01-02 NOTE — Patient Instructions (Addendum)
We are arranging an upper endoscopy with dilation by Dr. Marletta Lor as soon as possible!  I have increased Nexium to twice a day, 30 minutes before breakfast and dinner.  Please stay hydrated, drinking plenty of liquids to keep your urine light. Stick with very soft foods. Sit upright while eating. After eating, do not lay down for at least 2-3 hours  Further recommendations to follow!  It was a pleasure to see you today. I want to create trusting relationships with patients and provide genuine, compassionate, and quality care. I truly value your feedback, so please be on the lookout for a survey regarding your visit with me today. I appreciate your time in completing this!         Gelene Mink, PhD, ANP-BC [facility] Gastroenter[facility]

## 2023-01-03 ENCOUNTER — Encounter (HOSPITAL_COMMUNITY)
Admission: RE | Admit: 2023-01-03 | Discharge: 2023-01-03 | Disposition: A | Discharge status: Home / Self Care | Payer: Medicare Other | Source: Ambulatory Visit | Attending: Internal Medicine | Admitting: Internal Medicine

## 2023-01-03 NOTE — Patient Instructions (Addendum)
20    Your procedure is scheduled on: 01/04/2023  Report to [facility] Main Entrance at    7:45 AM.  Call this number if you have problems the morning of surgery: [phone]   Remember:   Follow instructions on letter from office regarding when to stop eating and drinking        No Smoking the day of procedure      Take these medicines the morning of surgery with A SIP OF WATER: Nexium and flomax   Do not wear jewelry, make-up or nail polish.  Do not wear lotions, powders, or perfumes. You may wear deodorant.                Do not bring valuables to the hospital.  Contacts, dentures or bridgework may not be worn into surgery.  Leave suitcase in the car. After surgery it may be brought to your room.  For patients admitted to the hospital, checkout time is 11:00 AM the day of discharge.   Patients discharged the day of surgery will not be allowed to drive home. Upper Endoscopy, Adult Upper endoscopy is a procedure to look inside the upper GI (gastrointestinal) tract. The upper GI tract is made up of: The part of the body that moves food from your mouth to your stomach (esophagus). The stomach. The first part of your small intestine (duodenum). This procedure is also called esophagogastroduodenoscopy (EGD) or gastroscopy. In this procedure, your health care provider passes a thin, flexible tube (endoscope) through your mouth and down your esophagus into your stomach. A small camera is attached to the end of the tube. Images from the camera appear on a monitor in the exam room. During this procedure, your health care provider may also remove a small piece of tissue to be sent to a lab and examined under a microscope (biopsy). Your health care provider may do an upper endoscopy to diagnose cancers of the upper GI tract. You may also have this procedure to find the cause of other conditions, such as: Stomach pain. Heartburn. Pain or problems when swallowing. Nausea and vomiting. Stomach  bleeding. Stomach ulcers. Tell a health care provider about: Any allergies you have. All medicines you are taking, including vitamins, herbs, eye drops, creams, and over-the-counter medicines. Any problems you or family members have had with anesthetic medicines. Any blood disorders you have. Any surgeries you have had. Any medical conditions you have. Whether you are pregnant or may be pregnant. What are the risks? Generally, this is a safe procedure. However, problems may occur, including: Infection. Bleeding. Allergic reactions to medicines. A tear or hole (perforation) in the esophagus, stomach, or duodenum. What happens before the procedure? Staying hydrated Follow instructions from your health care provider about hydration, which may include: Up to 4 hours before the procedure - you may continue to drink clear liquids, such as water, clear fruit juice, black coffee, and plain tea.   Medicines Ask your health care provider about: Changing or stopping your regular medicines. This is especially important if you are taking diabetes medicines or blood thinners. Taking medicines such as aspirin and ibuprofen. These medicines can thin your blood. Do not take these medicines unless your health care provider tells you to take them. Taking over-the-counter medicines, vitamins, herbs, and supplements. General instructions Plan to have someone take you home from the hospital or clinic. If you will be going home right after the procedure, plan to have someone with you for 24 hours. Ask your  health care provider what steps will be taken to help prevent infection. What happens during the procedure?  An IV will be inserted into one of your veins. You may be given one or more of the following: A medicine to help you relax (sedative). A medicine to numb the throat (local anesthetic). You will lie on your left side on an exam table. Your health care provider will pass the endoscope through  your mouth and down your esophagus. Your health care provider will use the scope to check the inside of your esophagus, stomach, and duodenum. Biopsies may be taken. The endoscope will be removed. The procedure may vary among health care providers and hospitals. What happens after the procedure? Your blood pressure, heart rate, breathing rate, and blood oxygen level will be monitored until you leave the hospital or clinic. Do not drive for 24 hours if you were given a sedative during your procedure. When your throat is no longer numb, you may be given some fluids to drink. It is up to you to get the results of your procedure. Ask your health care provider, or the department that is doing the procedure, when your results will be ready. Summary Upper endoscopy is a procedure to look inside the upper GI tract. During the procedure, an IV will be inserted into one of your veins. You may be given a medicine to help you relax. A medicine will be used to numb your throat. The endoscope will be passed through your mouth and down your esophagus. This information is not intended to replace advice given to you by your health care provider. Make sure you discuss any questions you have with your health care provider. Document Revised: 06/26/2017 Document Reviewed: 06/03/2017 Elsevier Patient Education  2020 [employer]                                                                                                                                      EndoscopyCare After  Please read the instructions outlined below and refer to this sheet in the next few weeks. These discharge instructions provide you with general information on caring for yourself after you leave the hospital. Your doctor may also give you specific instructions. While your treatment has been planned according to the most current medical practices available, unavoidable complications occasionally occur. If you have any problems or  questions after discharge, please call your doctor. HOME CARE INSTRUCTIONS Activity You may resume your regular activity but move at a slower pace for the next 24 hours.  Take frequent rest periods for the next 24 hours.  Walking will help expel (get rid of) the air and reduce the bloated feeling in your abdomen.  No driving for 24 hours (because of the anesthesia (medicine) used during the test).  You may shower.  Do not sign any important legal documents or operate any machinery for 24 hours (because of the anesthesia used  during the test).  Nutrition Drink plenty of fluids.  You may resume your normal diet.  Begin with a light meal and progress to your normal diet.  Avoid alcoholic beverages for 24 hours or as instructed by your caregiver.  Medications You may resume your normal medications unless your caregiver tells you otherwise. What you can expect today You may experience abdominal discomfort such as a feeling of fullness or "gas" pains.  You may experience a sore throat for 2 to 3 days. This is normal. Gargling with salt water may help this.  Follow-up Your doctor will discuss the results of your test with you. SEEK IMMEDIATE MEDICAL CARE IF: You have excessive nausea (feeling sick to your stomach) and/or vomiting.  You have severe abdominal pain and distention (swelling).  You have trouble swallowing.  You have a temperature over 100 F (37.8 C).  You have rectal bleeding or vomiting of blood.  Document Released: 08/16/2003 Document Revised: 12/21/2010 Document Reviewed: 02/26/2007 Upper Endoscopy, Adult, Care After After the procedure, it is common to have a sore throat. It is also common to have: Mild stomach pain or discomfort. Bloating. Nausea. Follow these instructions at home: The instructions below may help you care for yourself at home. Your health care provider may give you more instructions. If you have questions, ask your health care provider. If you were  given a sedative during the procedure, it can affect you for several hours. Do not drive or operate machinery until your health care provider says that it is safe. If you will be going home right after the procedure, plan to have a responsible adult: Take you home from the hospital or clinic. You will not be allowed to drive. Care for you for the time you are told. Follow instructions from your health care provider about what you may eat and drink. Return to your normal activities as told by your health care provider. Ask your health care provider what activities are safe for you. Take over-the-counter and prescription medicines only as told by your health care provider. Contact a health care provider if you: Have a sore throat that lasts longer than one day. Have trouble swallowing. Have a fever. Get help right away if you: Vomit blood or your vomit looks like coffee grounds. Have bloody, black, or tarry stools. Have a very bad sore throat or you cannot swallow. Have difficulty breathing or very bad pain in your chest or abdomen. These symptoms may be an emergency. Get help right away. Call 911. Do not wait to see if the symptoms will go away. Do not drive yourself to the hospital. Summary After the procedure, it is common to have a sore throat, mild stomach discomfort, bloating, and nausea. If you were given a sedative during the procedure, it can affect you for several hours. Do not drive until your health care provider says that it is safe. Follow instructions from your health care provider about what you may eat and drink. Return to your normal activities as told by your health care provider. This information is not intended to replace advice given to you by your health care provider. Make sure you discuss any questions you have with your health care provider. Document Revised: 04/12/2021 Document Reviewed: 04/12/2021 Elsevier Patient Education  2024 [employer].

## 2023-01-04 ENCOUNTER — Encounter (HOSPITAL_COMMUNITY)
Admission: RE | Disposition: A | Discharge status: Other Acute Inpatient Hospital | Payer: Self-pay | Source: Home / Self Care | Attending: Emergency Medicine

## 2023-01-04 ENCOUNTER — Ambulatory Visit (HOSPITAL_COMMUNITY): Payer: Medicare Other

## 2023-01-04 ENCOUNTER — Ambulatory Visit (HOSPITAL_COMMUNITY)
Admission: RE | Admit: 2023-01-04 | Discharge: 2023-01-05 | Disposition: A | Discharge status: Other Acute Inpatient Hospital | Payer: Medicare Other | Attending: Emergency Medicine | Admitting: Emergency Medicine

## 2023-01-04 ENCOUNTER — Encounter (HOSPITAL_COMMUNITY): Payer: Self-pay | Admitting: Internal Medicine

## 2023-01-04 ENCOUNTER — Other Ambulatory Visit: Payer: Self-pay

## 2023-01-04 ENCOUNTER — Ambulatory Visit (HOSPITAL_COMMUNITY): Payer: Medicare Other | Admitting: Anesthesiology

## 2023-01-04 DIAGNOSIS — I10 Essential (primary) hypertension: Secondary | ICD-10-CM | POA: Insufficient documentation

## 2023-01-04 DIAGNOSIS — K209 Esophagitis, unspecified without bleeding: Secondary | ICD-10-CM | POA: Diagnosis not present

## 2023-01-04 DIAGNOSIS — K2210 Ulcer of esophagus without bleeding: Secondary | ICD-10-CM | POA: Diagnosis not present

## 2023-01-04 DIAGNOSIS — R1310 Dysphagia, unspecified: Secondary | ICD-10-CM | POA: Insufficient documentation

## 2023-01-04 DIAGNOSIS — K269 Duodenal ulcer, unspecified as acute or chronic, without hemorrhage or perforation: Secondary | ICD-10-CM

## 2023-01-04 DIAGNOSIS — K208 Other esophagitis without bleeding: Secondary | ICD-10-CM | POA: Diagnosis not present

## 2023-01-04 DIAGNOSIS — K2091 Esophagitis, unspecified with bleeding: Secondary | ICD-10-CM | POA: Insufficient documentation

## 2023-01-04 DIAGNOSIS — K2100 Gastro-esophageal reflux disease with esophagitis, without bleeding: Secondary | ICD-10-CM | POA: Diagnosis not present

## 2023-01-04 DIAGNOSIS — R079 Chest pain, unspecified: Secondary | ICD-10-CM | POA: Diagnosis not present

## 2023-01-04 DIAGNOSIS — K5190 Ulcerative colitis, unspecified, without complications: Secondary | ICD-10-CM | POA: Insufficient documentation

## 2023-01-04 DIAGNOSIS — R1013 Epigastric pain: Secondary | ICD-10-CM | POA: Diagnosis not present

## 2023-01-04 DIAGNOSIS — K2980 Duodenitis without bleeding: Secondary | ICD-10-CM | POA: Diagnosis not present

## 2023-01-04 HISTORY — PX: ESOPHAGOGASTRODUODENOSCOPY (EGD) WITH PROPOFOL: SHX5813

## 2023-01-04 LAB — CBC WITH DIFFERENTIAL/PLATELET
Abs Immature Granulocytes: 0.16 10*3/uL — ABNORMAL HIGH (ref 0.00–0.07)
Basophils Absolute: 0 10*3/uL (ref 0.0–0.1)
Basophils Relative: 0 %
Eosinophils Absolute: 0 10*3/uL (ref 0.0–0.5)
Eosinophils Relative: 0 %
HCT: 39.7 % (ref 39.0–52.0)
Hemoglobin: 13.3 g/dL (ref 13.0–17.0)
Immature Granulocytes: 1 %
Lymphocytes Relative: 8 %
Lymphs Abs: 1.6 10*3/uL (ref 0.7–4.0)
MCH: 30.2 pg (ref 26.0–34.0)
MCHC: 33.5 g/dL (ref 30.0–36.0)
MCV: 90 fL (ref 80.0–100.0)
Monocytes Absolute: 1.8 10*3/uL — ABNORMAL HIGH (ref 0.1–1.0)
Monocytes Relative: 9 %
Neutro Abs: 17.2 10*3/uL — ABNORMAL HIGH (ref 1.7–7.7)
Neutrophils Relative %: 82 %
Platelets: 246 10*3/uL (ref 150–400)
RBC: 4.41 MIL/uL (ref 4.22–5.81)
RDW: 12.5 % (ref 11.5–15.5)
WBC: 20.8 10*3/uL — ABNORMAL HIGH (ref 4.0–10.5)
nRBC: 0 % (ref 0.0–0.2)

## 2023-01-04 LAB — COMPREHENSIVE METABOLIC PANEL
ALT: 16 U/L (ref 0–44)
AST: 17 U/L (ref 15–41)
Albumin: 3.2 g/dL — ABNORMAL LOW (ref 3.5–5.0)
Alkaline Phosphatase: 46 U/L (ref 38–126)
Anion gap: 10 (ref 5–15)
BUN: 28 mg/dL — ABNORMAL HIGH (ref 8–23)
CO2: 22 mmol/L (ref 22–32)
Calcium: 10 mg/dL (ref 8.9–10.3)
Chloride: 96 mmol/L — ABNORMAL LOW (ref 98–111)
Creatinine, Ser: 1.2 mg/dL (ref 0.61–1.24)
GFR, Estimated: >60 mL/min (ref >60)
Glucose, Bld: 126 mg/dL — ABNORMAL HIGH (ref 70–99)
Potassium: 3.6 mmol/L (ref 3.5–5.1)
Sodium: 128 mmol/L — ABNORMAL LOW (ref 135–145)
Total Bilirubin: 0.4 mg/dL (ref <1.2)
Total Protein: 6.5 g/dL (ref 6.5–8.1)

## 2023-01-04 LAB — LACTIC ACID, PLASMA
Lactic Acid, Venous: 1.4 mmol/L (ref 0.5–1.9)
Lactic Acid, Venous: 1.2 mmol/L (ref 0.5–1.9)

## 2023-01-04 SURGERY — ESOPHAGOGASTRODUODENOSCOPY (EGD) WITH PROPOFOL
Anesthesia: General

## 2023-01-04 MED ORDER — PHENYLEPHRINE 80 MCG/ML (10ML) SYRINGE FOR IV PUSH (FOR BLOOD PRESSURE SUPPORT)
PREFILLED_SYRINGE | INTRAVENOUS | Status: DC | PRN
  Administered 2023-01-04: 80 ug via INTRAVENOUS

## 2023-01-04 MED ORDER — LACTATED RINGERS IV SOLN: INTRAVENOUS | Status: DC

## 2023-01-04 MED ORDER — LIDOCAINE HCL (CARDIAC) PF 100 MG/5ML IV SOSY
PREFILLED_SYRINGE | INTRAVENOUS | Status: DC | PRN
  Administered 2023-01-04: 60 mg via INTRAVENOUS

## 2023-01-04 MED ORDER — SODIUM CHLORIDE 0.9 % IV BOLUS
500.0000 mL | Freq: Once | INTRAVENOUS | Status: AC
  Administered 2023-01-04: 500 mL via INTRAVENOUS

## 2023-01-04 MED ORDER — SODIUM CHLORIDE 0.9 % IV BOLUS
1000.0000 mL | Freq: Once | INTRAVENOUS | Status: AC
  Administered 2023-01-04: 1000 mL via INTRAVENOUS

## 2023-01-04 MED ORDER — LIDOCAINE HCL (PF) 2 % IJ SOLN
INTRAMUSCULAR | Status: AC
  Filled 2023-01-04: qty 5

## 2023-01-04 MED ORDER — PANTOPRAZOLE SODIUM 40 MG IV SOLR
40.0000 mg | Freq: Once | INTRAVENOUS | Status: AC
  Administered 2023-01-04: 40 mg via INTRAVENOUS
  Filled 2023-01-04: qty 10

## 2023-01-04 MED ORDER — SODIUM CHLORIDE 0.9 % IV SOLN
2.0000 g | Freq: Once | INTRAVENOUS | Status: AC
  Administered 2023-01-04: 2 g via INTRAVENOUS
  Filled 2023-01-04: qty 20

## 2023-01-04 MED ORDER — PROPOFOL 500 MG/50ML IV EMUL
INTRAVENOUS | Status: DC | PRN
  Administered 2023-01-04: 100 ug/kg/min via INTRAVENOUS

## 2023-01-04 MED ORDER — PROPOFOL 10 MG/ML IV BOLUS
INTRAVENOUS | Status: DC | PRN
  Administered 2023-01-04: 80 mg via INTRAVENOUS

## 2023-01-04 NOTE — Transfer of Care (Addendum)
Immediate Anesthesia Transfer of Care Note  Patient: David Mooney  Procedure(s) Performed: ESOPHAGOGASTRODUODENOSCOPY (EGD) WITH PROPOFOL BALLOON DILATION  Patient Location: Short Stay  Anesthesia Type:General  Level of Consciousness: drowsy and patient cooperative  Airway & Oxygen Therapy: Patient Spontanous Breathing and Patient connected to nasal cannula oxygen  Post-op Assessment: Report given to RN and Post -op Vital signs reviewed and stable  Post vital signs: Reviewed and stable  Last Vitals:  Vitals Value Taken Time  BP 103/54 01/04/23   1028  Temp 37.3 01/04/23   1028  Pulse 84 01/04/23   1028  Resp 22 01/04/23   1028  SpO2 98% 01/04/23   1028    Last Pain:  Vitals:   01/04/23 1010  PainSc: 0-No pain         Complications: No notable events documented.

## 2023-01-04 NOTE — ED Notes (Signed)
Report given to [facility], RN @ [facility]

## 2023-01-04 NOTE — ED Triage Notes (Addendum)
Patient Came in to have Endoscopy. Heavy Inflammation, necrosis and black through out small Bowel. Dr. Marletta Lor, GI MD would like Patient to got to [facility] or [facility]. Per Endo RN. Per patient has been having abdominal pain for about a month.

## 2023-01-04 NOTE — H&P (Signed)
Primary Care Physician:  Juliette Alcide, MD Primary Gastroenterologist:  Dr. Marletta Lor  Pre-Procedure History & Physical: HPI:  David Mooney is a 82 y.o. male is here for an EGD with possible dilation due to history of dysphagia, nausea vomiting, weight loss, GERD, abnormal CT abd/pelvis.   CT abd/pelvis with contrast 12/31/22 with wall thickening about the pyloric region with adjacent fat stranding, suspicious for peptic ulcer disease. Possible cystitis. Hepatic steatosis.    Nausea and vomiting for the past week, was having epigastric pain even prior to this. Having worsening GERD symptoms, solid food dysphagia, pain with swallowing. About 10 lbs weight loss since April 2024. Very small amounts of oral intake. Noets early satiety. Small amounts of food. Soft foods. Was having diarrhea recently but back to normal. Just tylenol products.     Past Medical History:  Diagnosis Date   Arthritis    Cataract    Complication of anesthesia    GERD (gastroesophageal reflux disease)    Glaucoma    Hernia of unspecified site of abdominal cavity without mention of obstruction or gangrene    History of gastric ulcer    PONV (postoperative nausea and vomiting)    Ulcerative colitis     Past Surgical History:  Procedure Laterality Date   BIOPSY  12/04/2018   Procedure: BIOPSY;  Surgeon: Malissa Hippo, MD;  Location: [facility];  Service: Endoscopy;;  cecum   COLONOSCOPY     COLONOSCOPY N/A 08/27/2012   Procedure: COLONOSCOPY;  Surgeon: Malissa Hippo, MD;  Location: [facility];  Service: Endoscopy;  Laterality: N/A;  1200-moved to 13:15 Ann to notify pt   COLONOSCOPY N/A 12/04/2018   Procedure: COLONOSCOPY;  Surgeon: Malissa Hippo, MD;  Location: [facility];  Service: Endoscopy;  Laterality: N/A;  155pm   CYSTOSCOPY WITH INSERTION OF UROLIFT N/A 04/30/2022   Procedure: CYSTOSCOPY WITH INSERTION OF UROLIFT;  Surgeon: Malen Gauze, MD;  Location: [facility] OR[facility];  Service: Urology;   Laterality: N/A;   HERNIA REPAIR Left    MOLE REMOVAL     From head , chest   UPPER GASTROINTESTINAL ENDOSCOPY      Prior to Admission medications   Medication Sig Start Date End Date Taking? Authorizing Provider  alfuzosin (UROXATRAL) 10 MG 24 hr tablet TAKE 1 TABLET(10 MG) BY MOUTH AT BEDTIME 03/08/22  Yes McKenzie, Mardene Celeste, MD  atorvastatin (LIPITOR) 20 MG tablet Take 20 mg by mouth daily. 01/16/22  Yes [provider]  cholecalciferol (VITAMIN D3) 25 MCG (1000 UT) tablet Take 1,000 Units by mouth daily.    Yes [provider]  ciprofloxacin (CIPRO) 500 MG tablet Take 500 mg by mouth 2 (two) times daily. 12/31/22  Yes [provider]  esomeprazole (NEXIUM) 40 MG capsule Take 1 capsule (40 mg total) by mouth 2 (two) times daily before a meal. 01/02/23  Yes Gelene Mink, NP  finasteride (PROSCAR) 5 MG tablet TAKE 1 TABLET(5 MG) BY MOUTH DAILY 02/26/22  Yes McKenzie, Mardene Celeste, MD  mesalamine (LIALDA) 1.2 g EC tablet TAKE 2 TABLETS(2.4 GRAMS) BY MOUTH TWICE DAILY 10/30/22  Yes Marguerita Merles, Reuel Boom, MD  mirabegron ER (MYRBETRIQ) 25 MG TB24 tablet Take 1 tablet (25 mg total) by mouth daily. 06/19/22  Yes Donnita Falls, FNP  Multiple Vitamins tablet Take 1 tablet by mouth daily. Multi vit with iron   Yes [provider]  Probiotic Product (ALIGN PO) Take 1 tablet by mouth daily.  Yes [provider]  silodosin (RAPAFLO) 8 MG CAPS capsule Take 1 capsule (8 mg total) by mouth daily with breakfast. 02/09/22  Yes McKenzie, Mardene Celeste, MD  tamsulosin (FLOMAX) 0.4 MG CAPS capsule Take 0.4 mg by mouth daily. 04/26/22  Yes [provider]  acetaminophen (TYLENOL) 650 MG CR tablet Take 650 mg by mouth every 8 (eight) hours as needed for pain.    [provider]  esomeprazole (NEXIUM) 40 MG capsule Take 1 capsule (40 mg total) by mouth daily before breakfast. 12/31/22   Upstill, Melvenia Beam, PA-C  fluticasone (FLONASE) 50 MCG/ACT nasal spray  Place 2 sprays into both nostrils daily as needed for allergies.    [provider]  ondansetron (ZOFRAN-ODT) 4 MG disintegrating tablet Take 1 tablet (4 mg total) by mouth every 8 (eight) hours as needed for nausea or vomiting. 12/31/22   Upstill, Shari, PA-C  VYZULTA 0.024 % SOLN Place 1 drop into both eyes every evening. 04/20/22   [provider]    Allergies as of 01/02/2023   (No Known Allergies)    Family History  Problem Relation Age of Onset   Arrhythmia Mother    Anuerysm Father    Hypertension Father    Diabetes Father    Healthy Sister    Healthy Sister    Irritable bowel syndrome Daughter    Thyroid disease Daughter     Social History   Socioeconomic History   Marital status: Married    Spouse name: Not on file   Number of children: Not on file   Years of education: Not on file   Highest education level: Not on file  Occupational History   Not on file  Tobacco Use   Smoking status: Never   Smokeless tobacco: Never  Vaping Use   Vaping status: Never Used  Substance and Sexual Activity   Alcohol use: No   Drug use: No   Sexual activity: Yes  Other Topics Concern   Not on file  Social History Narrative   Not on file   Social Drivers of Health   Financial Resource Strain: Not on file  Food Insecurity: Not on file  Transportation Needs: Not on file  Physical Activity: Not on file  Stress: Not on file  Social Connections: Not on file  Intimate Partner Violence: Not on file    Review of Systems: General: Negative for fever, chills, fatigue, weakness. Eyes: Negative for vision changes.  ENT: Negative for hoarseness, difficulty swallowing , nasal congestion. CV: Negative for chest pain, angina, palpitations, dyspnea on exertion, peripheral edema.  Respiratory: Negative for dyspnea at rest, dyspnea on exertion, cough, sputum, wheezing.  GI: See history of present illness. GU:  Negative for dysuria, hematuria, urinary incontinence,  urinary frequency, nocturnal urination.  MS: Negative for joint pain, low back pain.  Derm: Negative for rash or itching.  Neuro: Negative for weakness, abnormal sensation, seizure, frequent headaches, memory loss, confusion.  Psych: Negative for anxiety, depression Endo: Negative for unusual weight change.  Heme: Negative for bruising or bleeding. Allergy: Negative for rash or hives.  Physical Exam: Vital signs in last 24 hours: Temp:  [98.2 F (36.8 C)] 98.2 F (36.8 C) (12/20 0853) Pulse Rate:  [87-92] 87 (12/20 0900) Resp:  [16-23] 16 (12/20 0900) BP: (129-132)/(78-84) 132/78 (12/20 0900) SpO2:  [97 %] 97 % (12/20 0900)   General:   Alert,  Well-developed, well-nourished, pleasant and cooperative in NAD Head:  Normocephalic and atraumatic. Eyes:  Sclera clear, no  icterus.   Conjunctiva pink. Ears:  Normal auditory acuity. Nose:  No deformity, discharge,  or lesions. Msk:  Symmetrical without gross deformities. Normal posture. Extremities:  Without clubbing or edema. Neurologic:  Alert and  oriented x4;  grossly normal neurologically. Skin:  Intact without significant lesions or rashes. Psych:  Alert and cooperative. Normal mood and affect.   Impression/Plan: David Mooney is here for an EGD with possible dilation due to history of dysphagia, nausea vomiting, weight loss, GERD, abnormal CT abd/pelvis.   Risks, benefits, limitations, imponderables and alternatives regarding procedure have been reviewed with the patient. Questions have been answered. All parties agreeable.

## 2023-01-04 NOTE — ED Provider Notes (Signed)
  Physical Exam  BP 135/80   Pulse 83   Temp 98.2 F (36.8 C) (Oral)   Resp (!) 22   Ht 5\' 6"  (1.676 m)   Wt 65.8 kg   SpO2 94%   BMI 23.40 kg/m   Physical Exam Vitals and nursing note reviewed.  HENT:     Head: Normocephalic and atraumatic.  Eyes:     Pupils: Pupils are equal, round, and reactive to light.  Cardiovascular:     Rate and Rhythm: Normal rate and regular rhythm.  Pulmonary:     Effort: Pulmonary effort is normal.     Breath sounds: Normal breath sounds.  Abdominal:     Palpations: Abdomen is soft.     Tenderness: There is no abdominal tenderness.  Skin:    General: Skin is warm and dry.  Neurological:     Mental Status: He is alert.  Psychiatric:        Mood and Affect: Mood normal.     Procedures  Procedures  ED Course / MDM   Clinical Course as of 01/05/23 0149  Fri Jan 04, 2023  1607 Call taken from the [facility] transfer line.  Cardiothoracic surgery had previously spoken with our team here and recommended patient be admitted to general surgery service.  General surgery is requesting that this patient be admitted to medical service.  There are no medicine beds available at this time but 1 should become available either later tonight or tomorrow.  Transfer center line phone number is [phone].  Patient has remained stable to this point in the ED.  He has received ceftriaxone and Protonix and IV fluids [MP]  1914 I called the transfer line back.  Still no beds available.  I have put in a page to speak with Dr.De Harrell [facility] cardiothoracic surgery) again to see if an ED to ED transfer would be more appropriate.  Awaiting callback [MP]  2047 Call received from [facility] transfer line.  [facility] has been accepted to [facility]ontology unit at [facility].  Accepting physician Joaquim Nam, MD.  Awaiting bed assignment [MP]    Clinical Course User Index [MP] Royanne Foots, DO   Medical Decision Making I, Estelle June DO, have  assumed care of this patient from the previous provider finalization of transfer to [facility] for definitive care  Amount and/or Complexity of Data Reviewed Labs: ordered. Radiology: ordered. ECG/medicine tests: ordered.  Risk Prescription drug management.   Final diagnosis Esophagitis Duodenal ulcer       Royanne Foots, DO 01/05/23 0150

## 2023-01-04 NOTE — Op Note (Signed)
[facility] Patient Name: David Mooney Procedure Date: 01/04/2023 9:33 AM MRN: [id number] Date of Birth: 03/02/1940 Attending MD: Hennie Duos. Marletta Lor , [place], [id number] CSN: [id number] Age: 82 Admit Type: Inpatient Procedure:                Upper GI endoscopy Indications:              Epigastric abdominal pain, Dysphagia, Odynophagia,                            Nausea with vomiting Providers:                Hennie Duos. Marletta Lor, DO, Buel Ream. Museum/gallery exhibitions officer, Charity fundraiser,                            Judeth Cornfield. Jessee Avers, Technician Referring MD:              Medicines:                See the Anesthesia note for documentation of the                            administered medications Complications:            No immediate complications. Estimated Blood Loss:     Estimated blood loss was minimal. Procedure:                Pre-Anesthesia Assessment:                           - The anesthesia plan was to use monitored                            anesthesia care (MAC).                           After obtaining informed consent, the endoscope was                            passed under direct vision. Throughout the                            procedure, the patient's blood pressure, pulse, and                            oxygen saturations were monitored continuously. The                            [id number]-H190 ([id number]) scope was introduced through the                            mouth, and advanced to the second part of duodenum.                            The upper GI endoscopy was accomplished without                            difficulty.  The patient tolerated the procedure                            well. Scope In: 10:14:44 AM Scope Out: 10:23:14 AM Total Procedure Duration: 0 hours 8 minutes 30 seconds  Findings:      Severe esophagitis with no bleeding was found 28 to 38 cm from the       incisors. Black patches concerning for necrosis. Biopsies were taken       with a cold forceps for histology.       Severe esophagitis with no bleeding was found 24 to 38 cm from the       incisors. No necrosis seen in this region.      A large amount of food (residue) was found in the gastric body.      Large non-bleeding cratered duodenal ulcer with no stigmata of bleeding       was found in the duodenal bulb. Entire bulb ulcerated with black patches       concerning for necrosis. Biopsies were taken with a cold forceps for       histology. Impression:               - Severe erosive esophagitis with no bleeding.                            Biopsied.                           - Severe esophagitis with no bleeding.                           - A large amount of food (residue) in the stomach.                           - Non-bleeding duodenal ulcer with no stigmata of                            bleeding. Biopsied. Moderate Sedation:      Per Anesthesia Care Recommendation:           - Transfer patient to ER. Recommend IV antibiotics.                            Recommend transfer to quatern[facility] center. Likely                            will need cardiothoracic consultation. Check lactic                            acid and gastrin level. No reported history of                            caustic injury. Procedure Code(s):        --- Professional ---                           [phone], Esophagogastroduodenoscopy, flexible,  transoral; with biopsy, single or multiple Diagnosis Code(s):        --- Professional ---                           K20.80, Other esophagitis without bleeding                           K26.9, Duodenal ulcer, unspecified as acute or                            chronic, without hemorrhage or perforation                           R10.13, Epigastric pain                           R13.10, Dysphagia, unspecified                           R11.2, Nausea with vomiting, unspecified CPT copyright 2022 American Medical Association. All rights reserved. The codes documented in  this report are preliminary and upon coder review may  be revised to meet current compliance requirements. Hennie Duos. Marletta Lor, DO Hennie Duos. Marletta Lor, DO 01/04/2023 10:45:42 AM This report has been signed electronically. Number of Addenda: 0

## 2023-01-04 NOTE — ED Provider Notes (Signed)
[facility] AT [facility] Provider Note   CSN: [id number] Arrival date & time: 01/04/23  1107     History  Chief Complaint  Patient presents with   Abdominal Pain    David Mooney is a 82 y.o. male.  Patient presents with a history of 1 week of vomiting and epigastric discomfort.  Patient was seen by GI today and had endoscopy.  The endoscopy showed severe esophagitis with necrosis and duodenal ulcerations with necrosis.  Patient has a history of ulcerative colitis and gastric ulcer.  The gastroenterologist felt like the patient needed to go to a tertiary care center.  The history is provided by medical records Psychologist, sport and exercise). No language interpreter was used.  Abdominal Pain Pain location:  Epigastric Pain quality: aching   Pain radiates to:  Does not radiate Pain severity:  Moderate Onset quality:  Gradual Timing:  Intermittent Progression:  Unchanged Chronicity:  Recurrent Context: not alcohol use   Relieved by:  Nothing Worsened by:  Nothing Ineffective treatments:  None tried Associated symptoms: vomiting   Associated symptoms: no chest pain, no cough, no diarrhea, no fatigue and no hematuria        Home Medications Prior to Admission medications   Medication Sig Start Date End Date Taking? Authorizing Provider  alfuzosin (UROXATRAL) 10 MG 24 hr tablet TAKE 1 TABLET(10 MG) BY MOUTH AT BEDTIME 03/08/22  Yes McKenzie, Mardene Celeste, MD  atorvastatin (LIPITOR) 20 MG tablet Take 20 mg by mouth daily. 01/16/22  Yes [provider]  cholecalciferol (VITAMIN D3) 25 MCG (1000 UT) tablet Take 1,000 Units by mouth daily.    Yes [provider]  ciprofloxacin (CIPRO) 500 MG tablet Take 500 mg by mouth 2 (two) times daily. 12/31/22  Yes [provider]  esomeprazole (NEXIUM) 40 MG capsule Take 1 capsule (40 mg total) by mouth 2 (two) times daily before a meal. 01/02/23  Yes Gelene Mink, NP  finasteride (PROSCAR) 5  MG tablet TAKE 1 TABLET(5 MG) BY MOUTH DAILY 02/26/22  Yes McKenzie, Mardene Celeste, MD  mesalamine (LIALDA) 1.2 g EC tablet TAKE 2 TABLETS(2.4 GRAMS) BY MOUTH TWICE DAILY 10/30/22  Yes Marguerita Merles, Reuel Boom, MD  mirabegron ER (MYRBETRIQ) 25 MG TB24 tablet Take 1 tablet (25 mg total) by mouth daily. 06/19/22  Yes Donnita Falls, FNP  Multiple Vitamins tablet Take 1 tablet by mouth daily. Multi vit with iron   Yes [provider]  Probiotic Product (ALIGN PO) Take 1 tablet by mouth daily.    Yes [provider]  silodosin (RAPAFLO) 8 MG CAPS capsule Take 1 capsule (8 mg total) by mouth daily with breakfast. 02/09/22  Yes McKenzie, Mardene Celeste, MD  tamsulosin (FLOMAX) 0.4 MG CAPS capsule Take 0.4 mg by mouth daily. 04/26/22  Yes [provider]  acetaminophen (TYLENOL) 650 MG CR tablet Take 650 mg by mouth every 8 (eight) hours as needed for pain.    [provider]  esomeprazole (NEXIUM) 40 MG capsule Take 1 capsule (40 mg total) by mouth daily before breakfast. 12/31/22   Upstill, Melvenia Beam, PA-C  fluticasone (FLONASE) 50 MCG/ACT nasal spray Place 2 sprays into both nostrils daily as needed for allergies.    [provider]  ondansetron (ZOFRAN-ODT) 4 MG disintegrating tablet Take 1 tablet (4 mg total) by mouth every 8 (eight) hours as needed for nausea or vomiting. 12/31/22   Upstill, Shari, PA-C  VYZULTA 0.024 % SOLN Place 1 drop into both eyes every  evening. 04/20/22   [provider]      Allergies    Patient has no known allergies.    Review of Systems   Review of Systems  Constitutional:  Negative for appetite change and fatigue.  HENT:  Negative for congestion, ear discharge and sinus pressure.   Eyes:  Negative for discharge.  Respiratory:  Negative for cough.   Cardiovascular:  Negative for chest pain.  Gastrointestinal:  Positive for abdominal pain and vomiting. Negative for diarrhea.  Genitourinary:  Negative for frequency and hematuria.   Musculoskeletal:  Negative for back pain.  Skin:  Negative for rash.  Neurological:  Negative for seizures and headaches.  Psychiatric/Behavioral:  Negative for hallucinations.     Physical Exam Updated Vital Signs BP (!) 143/91   Pulse 77   Temp 98.2 F (36.8 C) (Oral)   Resp 17   Ht 5\' 6"  (1.676 m)   Wt 65.8 kg   SpO2 96%   BMI 23.40 kg/m  Physical Exam Vitals and nursing note reviewed.  Constitutional:      Appearance: He is well-developed.  HENT:     Head: Normocephalic.     Nose: Nose normal.  Eyes:     General: No scleral icterus.    Conjunctiva/sclera: Conjunctivae normal.  Neck:     Thyroid: No thyromegaly.  Cardiovascular:     Rate and Rhythm: Normal rate and regular rhythm.     Heart sounds: No murmur heard.    No friction rub. No gallop.  Pulmonary:     Breath sounds: No stridor. No wheezing or rales.  Chest:     Chest wall: No tenderness.  Abdominal:     General: There is no distension.     Tenderness: There is abdominal tenderness. There is no rebound.  Musculoskeletal:        General: Normal range of motion.     Cervical back: Neck supple.  Lymphadenopathy:     Cervical: No cervical adenopathy.  Skin:    Findings: No erythema or rash.  Neurological:     Mental Status: He is alert and oriented to person, place, and time.     Motor: No abnormal muscle tone.     Coordination: Coordination normal.  Psychiatric:        Behavior: Behavior normal.     ED Results / Procedures / Treatments   Labs (all labs ordered are listed, but only abnormal results are displayed) Labs Reviewed  CBC WITH DIFFERENTIAL/PLATELET - Abnormal; Notable for the following components:      Result Value   WBC 20.8 (*)    Neutro Abs 17.2 (*)    Monocytes Absolute 1.8 (*)    Abs Immature Granulocytes 0.16 (*)    All other components within normal limits  COMPREHENSIVE METABOLIC PANEL - Abnormal; Notable for the following components:   Sodium 128 (*)    Chloride 96 (*)     Glucose, Bld 126 (*)    BUN 28 (*)    Albumin 3.2 (*)    All other components within normal limits  LACTIC ACID, PLASMA  LACTIC ACID, PLASMA  SURGICAL PATHOLOGY    EKG None  Radiology DG Chest Port 1 View Result Date: 01/04/2023 CLINICAL DATA:  Pain. EXAM: PORTABLE CHEST 1 VIEW COMPARISON:  01/02/2018 FINDINGS: The lungs are clear without focal pneumonia, edema, pneumothorax or pleural effusion. Interstitial markings are diffusely coarsened with chronic features. Cardiopericardial silhouette is at upper limits of normal for size. No acute bony abnormality.  Telemetry leads overlie the chest. IMPRESSION: Chronic interstitial coarsening without acute cardiopulmonary findings. Electronically Signed   By: Kennith Center M.D.   On: 01/04/2023 12:58    Procedures Procedures    Medications Ordered in ED Medications  sodium chloride 0.9 % bolus 500 mL (0 mLs Intravenous Stopped 01/04/23 1318)  pantoprazole (PROTONIX) injection 40 mg (40 mg Intravenous Given 01/04/23 1144)  cefTRIAXone (ROCEPHIN) 2 g in sodium chloride 0.9 % 100 mL IVPB (0 g Intravenous Stopped 01/04/23 1300)  sodium chloride 0.9 % bolus 1,000 mL (1,000 mLs Intravenous Bolus 01/04/23 1411)    ED Course/ Medical Decision Making/ A&P Clinical Course as of 01/04/23 1711  Fri Jan 04, 2023  1607 [phone] [MP]    Clinical Course User Index [MP] Royanne Foots, DO    CRITICAL CARE Performed by: Bethann Berkshire Total critical care time: 40 minutes Critical care time was exclusive of separately billable procedures and treating other patients. Critical care was necessary to treat or prevent imminent or life-threatening deterioration. Critical care was time spent personally by me on the following activities: development of treatment plan with patient and/or surrogate as well as nursing, discussions with consultants, evaluation of patient's response to treatment, examination of patient, obtaining history from patient or  surrogate, ordering and performing treatments and interventions, ordering and review of laboratory studies, ordering and review of radiographic studies, pulse oximetry and re-evaluation of patient's condition.                                Medical Decision Making Amount and/or Complexity of Data Reviewed Labs: ordered. Radiology: ordered. ECG/medicine tests: ordered.  Risk Prescription drug management.   Patient with severe esophagitis with necrosis and duodenal ulcerations with necrosis.  [facility] be admitted to [facility].  Disposition will be completed by my colleague Dr. Elayne Snare        Final Clinical Impression(s) / ED Diagnoses Final diagnoses:  None    Rx / DC Orders ED Discharge Orders     None         Bethann Berkshire, MD 01/04/23 1713

## 2023-01-04 NOTE — Anesthesia Preprocedure Evaluation (Signed)
Anesthesia Evaluation  Patient identified by MRN, date of birth, ID band Patient awake    Reviewed: Allergy & Precautions, H&P , NPO status , Patient's Chart, lab work & pertinent test results, reviewed documented beta blocker date and time   History of Anesthesia Complications (+) PONV and history of anesthetic complications  Airway Mallampati: II  TM Distance: >3 FB Neck ROM: full    Dental no notable dental hx.    Pulmonary neg pulmonary ROS   Pulmonary exam normal breath sounds clear to auscultation       Cardiovascular Exercise Tolerance: Good hypertension, negative cardio ROS  Rhythm:regular Rate:Normal     Neuro/Psych negative neurological ROS  negative psych ROS   GI/Hepatic negative GI ROS, Neg liver ROS, PUD,GERD  ,,  Endo/Other  negative endocrine ROS    Renal/GU Renal diseasenegative Renal ROS  negative genitourinary   Musculoskeletal   Abdominal   Peds  Hematology negative hematology ROS (+) Blood dyscrasia, anemia   Anesthesia Other Findings   Reproductive/Obstetrics negative OB ROS                             Anesthesia Physical Anesthesia Plan  ASA: 2  Anesthesia Plan: General   Post-op Pain Management:    Induction:   PONV Risk Score and Plan: Propofol infusion  Airway Management Planned:   Additional Equipment:   Intra-op Plan:   Post-operative Plan:   Informed Consent: I have reviewed the patients History and Physical, chart, labs and discussed the procedure including the risks, benefits and alternatives for the proposed anesthesia with the patient or authorized representative who has indicated his/her understanding and acceptance.     Dental Advisory Given  Plan Discussed with: CRNA  Anesthesia Plan Comments:        Anesthesia Quick Evaluation

## 2023-01-05 DIAGNOSIS — J90 Pleural effusion, not elsewhere classified: Secondary | ICD-10-CM | POA: Diagnosis not present

## 2023-01-05 DIAGNOSIS — N2889 Other specified disorders of kidney and ureter: Secondary | ICD-10-CM | POA: Diagnosis not present

## 2023-01-05 DIAGNOSIS — K209 Esophagitis, unspecified without bleeding: Secondary | ICD-10-CM | POA: Diagnosis not present

## 2023-01-05 DIAGNOSIS — R918 Other nonspecific abnormal finding of lung field: Secondary | ICD-10-CM | POA: Diagnosis not present

## 2023-01-05 DIAGNOSIS — Z79899 Other long term (current) drug therapy: Secondary | ICD-10-CM | POA: Diagnosis not present

## 2023-01-05 DIAGNOSIS — I82412 Acute embolism and thrombosis of left femoral vein: Secondary | ICD-10-CM | POA: Diagnosis not present

## 2023-01-05 DIAGNOSIS — I82452 Acute embolism and thrombosis of left peroneal vein: Secondary | ICD-10-CM | POA: Diagnosis not present

## 2023-01-05 DIAGNOSIS — E876 Hypokalemia: Secondary | ICD-10-CM | POA: Diagnosis not present

## 2023-01-05 DIAGNOSIS — N400 Enlarged prostate without lower urinary tract symptoms: Secondary | ICD-10-CM | POA: Diagnosis not present

## 2023-01-05 DIAGNOSIS — K5190 Ulcerative colitis, unspecified, without complications: Secondary | ICD-10-CM | POA: Diagnosis not present

## 2023-01-05 DIAGNOSIS — E871 Hypo-osmolality and hyponatremia: Secondary | ICD-10-CM | POA: Diagnosis not present

## 2023-01-05 DIAGNOSIS — I10 Essential (primary) hypertension: Secondary | ICD-10-CM | POA: Diagnosis not present

## 2023-01-05 DIAGNOSIS — K2100 Gastro-esophageal reflux disease with esophagitis, without bleeding: Secondary | ICD-10-CM | POA: Diagnosis not present

## 2023-01-05 DIAGNOSIS — E785 Hyperlipidemia, unspecified: Secondary | ICD-10-CM | POA: Diagnosis not present

## 2023-01-05 DIAGNOSIS — K2091 Esophagitis, unspecified with bleeding: Secondary | ICD-10-CM | POA: Diagnosis not present

## 2023-01-05 DIAGNOSIS — K55069 Acute infarction of intestine, part and extent unspecified: Secondary | ICD-10-CM | POA: Diagnosis not present

## 2023-01-05 DIAGNOSIS — K2210 Ulcer of esophagus without bleeding: Secondary | ICD-10-CM | POA: Diagnosis not present

## 2023-01-05 DIAGNOSIS — N201 Calculus of ureter: Secondary | ICD-10-CM | POA: Diagnosis not present

## 2023-01-05 DIAGNOSIS — I82442 Acute embolism and thrombosis of left tibial vein: Secondary | ICD-10-CM | POA: Diagnosis not present

## 2023-01-05 DIAGNOSIS — K2289 Other specified disease of esophagus: Secondary | ICD-10-CM | POA: Diagnosis not present

## 2023-01-05 DIAGNOSIS — K269 Duodenal ulcer, unspecified as acute or chronic, without hemorrhage or perforation: Secondary | ICD-10-CM | POA: Diagnosis not present

## 2023-01-05 DIAGNOSIS — R1310 Dysphagia, unspecified: Secondary | ICD-10-CM | POA: Diagnosis not present

## 2023-01-05 DIAGNOSIS — R1013 Epigastric pain: Secondary | ICD-10-CM | POA: Diagnosis not present

## 2023-01-08 LAB — SURGICAL PATHOLOGY

## 2023-01-08 NOTE — Anesthesia Postprocedure Evaluation (Signed)
Anesthesia Post Note  Patient: ZAKKERY MELHORN  Procedure(s) Performed: ESOPHAGOGASTRODUODENOSCOPY (EGD) WITH PROPOFOL  Patient location during evaluation: Phase II Anesthesia Type: General Level of consciousness: awake Pain management: pain level controlled Vital Signs Assessment: post-procedure vital signs reviewed and stable Respiratory status: spontaneous breathing and respiratory function stable Cardiovascular status: blood pressure returned to baseline and stable Postop Assessment: no headache and no apparent nausea or vomiting Anesthetic complications: no Comments: Late entry   No notable events documented.   Last Vitals:  Vitals:   01/04/23 2345 01/04/23 2357  BP: 128/72 112/69  Pulse: 84 91  Resp: 20 19  Temp:  36.7 C  SpO2: 94% 95%    Last Pain:  Vitals:   01/05/23 0147  TempSrc:   PainSc: 0-No pain                 Windell Norfolk

## 2023-01-11 DIAGNOSIS — I82412 Acute embolism and thrombosis of left femoral vein: Secondary | ICD-10-CM | POA: Insufficient documentation

## 2023-01-17 NOTE — Progress Notes (Signed)
 Name: David Mooney DOB: 09/09/1940 MRN: [id number]  History of Present Illness: Mr. David Mooney is a 83 y.o. male who presents today for follow up visit at [facility] [place]. He is accompanied by his daughter Eleanor, who is a teacher, early years/pre. - GU History: History of Present Illness: Mr. David Mooney is a 83 y.o. male who presents today for follow up visit at [facility] . - GU History: 1. BPH with BOO.  - Previously took Rapaflo  and Uroxatral . - Taking Proscar , Flomax , Myrbetriq  25 mg. - 04/30/2022: Underwent Urolift procedure by Dr. Sherrilee. 2. Kidney stones.   At last visit on 06/19/2022: Doing well.   Since last visit: > 09/21/2022: Seen in ER. CT showed a 4 mm distal left ureteral stone with minimal left hydroureteronephrosis.  > 12/31/2022: Seen in ER. CT showed small renal cysts. No GU stones, masses, or hydronephrosis.  > 01/05/2023: CT abdomen/pelvis w/ contrast at [facility] showed Distal left ureteral stone measuring 3 mm without substantial hydronephrosis.   Today: He states that he thinks he passed a stone recently. Denies flank pain, abdominal pain, fevers, nausea, or vomiting. Denies urinary hesitancy, urgency, frequency, nocturia, dysuria, gross hematuria, weak urinary stream, straining to void, or sensations of incomplete emptying.   Fall Screening: Do you usually have a device to assist in your mobility? No   Medications: Current Outpatient Medications  Medication Sig Dispense Refill   acetaminophen  (TYLENOL ) 650 MG CR tablet Take 650 mg by mouth every 8 (eight) hours as needed for pain.     atorvastatin (LIPITOR) 20 MG tablet Take 20 mg by mouth daily.     ciprofloxacin  (CIPRO ) 500 MG tablet Take 500 mg by mouth 2 (two) times daily.     esomeprazole  (NEXIUM ) 40 MG capsule Take 1 capsule (40 mg total) by mouth daily before breakfast. 90 capsule 3   esomeprazole  (NEXIUM ) 40 MG capsule Take 1 capsule (40 mg total) by mouth 2 (two) times daily  before a meal. 60 capsule 5   finasteride  (PROSCAR ) 5 MG tablet TAKE 1 TABLET(5 MG) BY MOUTH DAILY 90 tablet 3   fluticasone (FLONASE) 50 MCG/ACT nasal spray Place 2 sprays into both nostrils daily as needed for allergies.     mesalamine  (LIALDA ) 1.2 g EC tablet TAKE 2 TABLETS(2.4 GRAMS) BY MOUTH TWICE DAILY 360 tablet 3   mirabegron  ER (MYRBETRIQ ) 25 MG TB24 tablet Take 1 tablet (25 mg total) by mouth daily. 30 tablet 11   Multiple Vitamins tablet Take 1 tablet by mouth daily. Multi vit with iron     ondansetron  (ZOFRAN -ODT) 4 MG disintegrating tablet Take 1 tablet (4 mg total) by mouth every 8 (eight) hours as needed for nausea or vomiting. 20 tablet 0   Probiotic Product (ALIGN PO) Take 1 tablet by mouth daily.      tamsulosin  (FLOMAX ) 0.4 MG CAPS capsule Take 0.4 mg by mouth daily.     VYZULTA 0.024 % SOLN Place 1 drop into both eyes every evening.     cholecalciferol (VITAMIN D3) 25 MCG (1000 UT) tablet Take 1,000 Units by mouth daily.  (Patient not taking: Reported on 01/23/2023)     No current facility-administered medications for this visit.    Allergies: No Known Allergies  Past Medical History:  Diagnosis Date   Arthritis    Cataract    Complication of anesthesia    GERD (gastroesophageal reflux disease)    Glaucoma    Hernia of unspecified site of abdominal cavity without mention of obstruction  or gangrene    History of gastric ulcer    PONV (postoperative nausea and vomiting)    Ulcerative colitis    Past Surgical History:  Procedure Laterality Date   BIOPSY  12/04/2018   Procedure: BIOPSY;  Surgeon: Golda Claudis PENNER, MD;  [facility];  Service: Endoscopy;;  cecum   COLONOSCOPY     COLONOSCOPY N/A 08/27/2012   Procedure: COLONOSCOPY;  Surgeon: Claudis PENNER Golda, MD;  [facility];  Service: Endoscopy;  Laterality: N/A;  1200-moved to 13:15 Ann to notify pt   COLONOSCOPY N/A 12/04/2018   Procedure: COLONOSCOPY;  Surgeon: Golda Claudis PENNER, MD;   [facility];  Service: Endoscopy;  Laterality: N/A;  155pm   CYSTOSCOPY WITH INSERTION OF UROLIFT N/A 04/30/2022   Procedure: CYSTOSCOPY WITH INSERTION OF UROLIFT;  Surgeon: Sherrilee Belvie CROME, MD;  Location: [facility];  Service: Urology;  Laterality: N/A;   HERNIA REPAIR Left    MOLE REMOVAL     From head , chest   UPPER GASTROINTESTINAL ENDOSCOPY     Family History  Problem Relation Age of Onset   Arrhythmia Mother    Anuerysm Father    Hypertension Father    Diabetes Father    Healthy Sister    Healthy Sister    Irritable bowel syndrome Daughter    Thyroid  disease Daughter    Social History   Socioeconomic History   Marital status: Married    Spouse name: Not on file   Number of children: Not on file   Years of education: Not on file   Highest education level: Not on file  Occupational History   Not on file  Tobacco Use   Smoking status: Never   Smokeless tobacco: Never  Vaping Use   Vaping status: Never Used  Substance and Sexual Activity   Alcohol  use: No   Drug use: No   Sexual activity: Yes  Other Topics Concern   Not on file  Social History Narrative   Not on file   Social Drivers of Health   Financial Resource Strain: Not on file  Food Insecurity: Low Risk  (01/05/2023)   Received from [facility]   Hunger Vital Sign    Worried About Running Out of Food in the Last Year: Never true    Ran Out of Food in the Last Year: Never true  Transportation Needs: No Transportation Needs (01/05/2023)   Received from [facility]    In the past 12 months, has lack of reliable transportation kept you from medical appointments, meetings, work or from getting things needed for daily living? : No  Physical Activity: Not on file  Stress: Not on file  Social Connections: Not on file  Intimate Partner Violence: Not on file    SUBJECTIVE  Review of Systems Constitutional: Patient denies any unintentional weight loss or change in  strength lntegumentary: Patient denies any rashes or pruritus Cardiovascular: Patient denies chest pain or syncope Respiratory: Patient denies shortness of breath Gastrointestinal: Patient denies nausea, vomiting, constipation, or diarrhea Musculoskeletal: Patient denies muscle cramps or weakness Neurologic: Patient denies convulsions or seizures Allergic/Immunologic: Patient denies recent allergic reaction(s) Hematologic/Lymphatic: Patient denies bleeding tendencies Endocrine: Patient denies heat/cold intolerance  GU: As per HPI.  OBJECTIVE Vitals:   01/23/23 1100  BP: 122/78  Pulse: 87  Temp: 98.2 F (36.8 C)   There is no height or weight on file to calculate BMI.  Physical Examination Constitutional: No obvious distress;  patient is non-toxic appearing  Cardiovascular: No visible lower extremity edema.  Respiratory: The patient does not have audible wheezing/stridor; respirations do not appear labored  Gastrointestinal: Abdomen non-distended Musculoskeletal: Normal ROM of UEs  Skin: No obvious rashes/open sores  Neurologic: CN 2-12 grossly intact Psychiatric: Answered questions appropriately with normal affect  Hematologic/Lymphatic/Immunologic: No obvious bruises or sites of spontaneous bleeding  UA: Patient did not void in office today  PVR: 107 ml  ASSESSMENT Kidney stones - Plan: Urinalysis, Routine w reflex microscopic, BLADDER SCAN AMB NON-IMAGING, DG Abd 1 View, DG Abd 1 View  Benign prostatic hyperplasia with urinary obstruction - Plan: Urinalysis, Routine w reflex microscopic, BLADDER SCAN AMB NON-IMAGING  We reviewed recent history and imaging results. He is asymptomatic for stone today. We agreed to obtain KUB this week to confirm passage of the 3 mm left distal ureteral stone seen on recent CT. Will plan to follow up in 6 months with KUB for stone surveillance or sooner if needed. Pt verbalized understanding and agreement. All questions were  answered.  PLAN Advised the following: KUB today. Return in about 6 months (around 07/23/2023) for KUB, UA, PVR, & f/u with Lauraine Oz NP.  Orders Placed This Encounter  Procedures   DG Abd 1 View    Standing Status:   Future    Expected Date:   01/23/2023    Expiration Date:   01/23/2024    Reason for Exam (SYMPTOM  OR DIAGNOSIS REQUIRED):   kidney stone    Preferred imaging location?:   [facility]   DG Abd 1 View    Standing Status:   Future    Expected Date:   07/23/2023    Expiration Date:   01/23/2024    Reason for Exam (SYMPTOM  OR DIAGNOSIS REQUIRED):   kidney stone    Preferred imaging location?:   [facility]   Urinalysis, Routine w reflex microscopic   BLADDER SCAN AMB NON-IMAGING    It has been explained that the patient is to follow regularly with their PCP in addition to all other providers involved in their care and to follow instructions provided by these respective offices. Patient advised to contact urology clinic if any urologic-pertaining questions, concerns, new symptoms or problems arise in the interim period.  There are no Patient Instructions on file for this visit.  Electronically signed by:  Lauraine KYM Oz, MSN, FNP-C, CUNP 01/23/2023 12:13 PM

## 2023-01-22 DIAGNOSIS — K269 Duodenal ulcer, unspecified as acute or chronic, without hemorrhage or perforation: Secondary | ICD-10-CM | POA: Diagnosis not present

## 2023-01-22 DIAGNOSIS — K5180 Other ulcerative colitis without complications: Secondary | ICD-10-CM | POA: Diagnosis not present

## 2023-01-22 DIAGNOSIS — K2210 Ulcer of esophagus without bleeding: Secondary | ICD-10-CM | POA: Diagnosis not present

## 2023-01-22 DIAGNOSIS — I82412 Acute embolism and thrombosis of left femoral vein: Secondary | ICD-10-CM | POA: Diagnosis not present

## 2023-01-23 ENCOUNTER — Encounter: Payer: Self-pay | Admitting: Urology

## 2023-01-23 ENCOUNTER — Ambulatory Visit (HOSPITAL_COMMUNITY)
Admission: RE | Admit: 2023-01-23 | Discharge: 2023-01-23 | Disposition: A | Discharge status: Home / Self Care | Payer: Medicare Other | Source: Ambulatory Visit | Attending: Urology | Admitting: Urology

## 2023-01-23 ENCOUNTER — Ambulatory Visit: Payer: Medicare Other | Admitting: Urology

## 2023-01-23 VITALS — BP 122/78 | HR 87 | Temp 98.2°F

## 2023-01-23 DIAGNOSIS — N200 Calculus of kidney: Secondary | ICD-10-CM | POA: Diagnosis not present

## 2023-01-23 DIAGNOSIS — Z87442 Personal history of urinary calculi: Secondary | ICD-10-CM | POA: Diagnosis not present

## 2023-01-23 DIAGNOSIS — N138 Other obstructive and reflux uropathy: Secondary | ICD-10-CM

## 2023-01-23 DIAGNOSIS — N401 Enlarged prostate with lower urinary tract symptoms: Secondary | ICD-10-CM | POA: Diagnosis not present

## 2023-01-23 LAB — BLADDER SCAN AMB NON-IMAGING: Scan Result: 107

## 2023-01-23 NOTE — Progress Notes (Addendum)
PVR 107 ?

## 2023-01-24 ENCOUNTER — Encounter (HOSPITAL_COMMUNITY): Payer: Self-pay | Admitting: Internal Medicine

## 2023-01-28 ENCOUNTER — Other Ambulatory Visit (HOSPITAL_COMMUNITY): Payer: Self-pay | Admitting: Occupational Therapy

## 2023-01-28 DIAGNOSIS — R1312 Dysphagia, oropharyngeal phase: Secondary | ICD-10-CM

## 2023-01-28 DIAGNOSIS — K2210 Ulcer of esophagus without bleeding: Secondary | ICD-10-CM

## 2023-02-08 DIAGNOSIS — N182 Chronic kidney disease, stage 2 (mild): Secondary | ICD-10-CM | POA: Diagnosis not present

## 2023-02-08 DIAGNOSIS — Z1329 Encounter for screening for other suspected endocrine disorder: Secondary | ICD-10-CM | POA: Diagnosis not present

## 2023-02-08 DIAGNOSIS — R7301 Impaired fasting glucose: Secondary | ICD-10-CM | POA: Diagnosis not present

## 2023-02-08 DIAGNOSIS — N183 Chronic kidney disease, stage 3 unspecified: Secondary | ICD-10-CM | POA: Diagnosis not present

## 2023-02-08 DIAGNOSIS — E782 Mixed hyperlipidemia: Secondary | ICD-10-CM | POA: Diagnosis not present

## 2023-02-18 ENCOUNTER — Ambulatory Visit (INDEPENDENT_AMBULATORY_CARE_PROVIDER_SITE_OTHER): Payer: Medicare Other | Admitting: Gastroenterology

## 2023-02-18 ENCOUNTER — Telehealth: Payer: Self-pay

## 2023-02-18 ENCOUNTER — Encounter (INDEPENDENT_AMBULATORY_CARE_PROVIDER_SITE_OTHER): Payer: Self-pay | Admitting: Gastroenterology

## 2023-02-18 VITALS — BP 115/76 | HR 77 | Temp 97.4°F | Ht 66.0 in | Wt 171.3 lb

## 2023-02-18 DIAGNOSIS — K209 Esophagitis, unspecified without bleeding: Secondary | ICD-10-CM | POA: Diagnosis not present

## 2023-02-18 DIAGNOSIS — K2091 Esophagitis, unspecified with bleeding: Secondary | ICD-10-CM

## 2023-02-18 DIAGNOSIS — K269 Duodenal ulcer, unspecified as acute or chronic, without hemorrhage or perforation: Secondary | ICD-10-CM | POA: Diagnosis not present

## 2023-02-18 DIAGNOSIS — K5100 Ulcerative (chronic) pancolitis without complications: Secondary | ICD-10-CM

## 2023-02-18 NOTE — Progress Notes (Unsigned)
Katrinka Blazing, M.D. Gastroenterology & Hepatology [facility] [address] [place], [place] [address]  Primary Care Physician: Juliette Alcide, MD [address] [place] [address]  I will communicate my assessment and recommendations to the referring MD via EMR.  Problems: Severe esophagitis Duodenal ulcer Ulcerative colitis  History of Present Illness: David Mooney is a 83 y.o. male with past medical history of GERD, ulcerative colitis, who presents for follow up of severe esophagitis and duodenal ulcer.  The patient was last seen on 01/02/2023. At that time, the patient was sent to the ED for evaluation of issues with dysphagia.  The patient had a his Nexium increased to twice a day dosing.  Wife reported he presented new onset of issues swallowing pills 3 weeks before his last appointment. He was also having abdominal pain around the same time.  Patient went to the ER for evaluation of worsening symptoms on 12/31/22.  CT of the abdomen and pelvis with IV contrast showed presence of thickening of the pylorus suspicious for peptic ulcer disease.  He was discharged on Zofran as needed.  However, given recurrence of symptoms the patient decided to come back to [facility] on 27 May 2022. Patient underwent EGD with Dr. Marletta Lor at [facility] on 01/04/2023.  Patient was found to have severe esophagitis between 28 to 38 cm from the incisors with black patches concerning for necrosis (biopsies showed presence of necrotic tissue with acute and chronic inflammation, also presence of granulomas), there was also esophagitis between 24 to 28 cm but there was no presence of black spots.  There was a large amount of food in the gastric body.  There was a large nonbleeding cratered duodenal ulcer, with presence of black patches in the duodenal bulb (there was presence of acute and chronic inflammation, presence of granulomas, negative for  fungal stains).  It was recommended that the patient was transferred to a quaternary center for cardiothoracic consultation.  The patient was subsequently transferred to [facility]  He was monitored at this facility but no endoscopic or surgical interventions were performed.  He was continued on PPI twice daily and Carafate.  Diet was slowly advanced.  Notably, the patient was found to have a DVT of the left femoral vein and was subsequently started on heparin and finally was put on Eliquis.  Notably, he advised to continue at least 6 months of treatment with anticoagulation.  He was discharged home on Augmentin for 5 days.  He reports that he is feeling much better compared to prior. He has been only on pureed diet since Christmas. He is not having any dysphagia or odynophagia since he left the hospital. He denies having any pain at the moment.  He is currently taking sucralfate 4 times a day, usually takes it before meals. He is also taking Nexium 40 mg twice a day.  The patient denies having any nausea, vomiting, fever, chills, hematochezia, melena, hematemesis, diarrhea, jaundice, pruritus or weight loss.  He has a MBS scheudled on 02/20/2023.  Patient is currently taking Lialda 2.4 g twice a day  Last EGD:  as above Last Colonoscopy: 12/04/2018 Normal terminal ileum Area of mildly congested mucosa in the cecum which was biopsied (mildly active chronic colitis), multiple scars in the colon related to healed colitis, 2 small polyps in the splenic flexure.  Pathology consistent with tubular adenomas. Recommended repeat colonoscopy in 5 years.  Past Medical History: Past  Medical History:  Diagnosis Date   Arthritis    Cataract    Complication of anesthesia    GERD (gastroesophageal reflux disease)    Glaucoma    Hernia of unspecified site of abdominal cavity without mention of obstruction or gangrene    History of gastric ulcer    PONV (postoperative nausea and  vomiting)    Ulcerative colitis     Past Surgical History: Past Surgical History:  Procedure Laterality Date   BIOPSY  12/04/2018   Procedure: BIOPSY;  Surgeon: Malissa Hippo, MD;  [facility];  Service: Endoscopy;;  cecum   COLONOSCOPY     COLONOSCOPY N/A 08/27/2012   Procedure: COLONOSCOPY;  Surgeon: Malissa Hippo, MD;  [facility];  Service: Endoscopy;  Laterality: N/A;  1200-moved to 13:15 Ann to notify pt   COLONOSCOPY N/A 12/04/2018   Procedure: COLONOSCOPY;  Surgeon: Malissa Hippo, MD;  [facility];  Service: Endoscopy;  Laterality: N/A;  155pm   CYSTOSCOPY WITH INSERTION OF UROLIFT N/A 04/30/2022   Procedure: CYSTOSCOPY WITH INSERTION OF UROLIFT;  Surgeon: Malen Gauze, MD;  Location: [facility];  Service: Urology;  Laterality: N/A;   ESOPHAGOGASTRODUODENOSCOPY (EGD) WITH PROPOFOL  01/04/2023   Procedure: ESOPHAGOGASTRODUODENOSCOPY (EGD) WITH PROPOFOL;  Surgeon: Lanelle Bal, DO;  [facility];  Service: Endoscopy;;   HERNIA REPAIR Left    MOLE REMOVAL     From head , chest   UPPER GASTROINTESTINAL ENDOSCOPY      Family History: Family History  Problem Relation Age of Onset   Arrhythmia Mother    Anuerysm Father    Hypertension Father    Diabetes Father    Healthy Sister    Healthy Sister    Irritable bowel syndrome Daughter    Thyroid disease Daughter     Social History: Social History   Tobacco Use  Smoking Status Never  Smokeless Tobacco Never   Social History   Substance and Sexual Activity  Alcohol Use No   Social History   Substance and Sexual Activity  Drug Use No    Allergies: No Known Allergies  Medications: Current Outpatient Medications  Medication Sig Dispense Refill   apixaban (ELIQUIS) 5 MG TABS tablet Take 5 mg by mouth 2 (two) times daily.     atorvastatin (LIPITOR) 20 MG tablet Take 20 mg by mouth daily.     esomeprazole (NEXIUM) 40 MG capsule Take 1 capsule (40 mg  total) by mouth 2 (two) times daily before a meal. 60 capsule 5   finasteride (PROSCAR) 5 MG tablet TAKE 1 TABLET(5 MG) BY MOUTH DAILY 90 tablet 3   fluticasone (FLONASE) 50 MCG/ACT nasal spray Place 2 sprays into both nostrils daily as needed for allergies.     mesalamine (LIALDA) 1.2 g EC tablet TAKE 2 TABLETS(2.4 GRAMS) BY MOUTH TWICE DAILY 360 tablet 3   mirabegron ER (MYRBETRIQ) 25 MG TB24 tablet Take 1 tablet (25 mg total) by mouth daily. 30 tablet 11   Multiple Vitamin (MULTIVITAMIN) tablet Take 1 tablet by mouth daily.     sucralfate (CARAFATE) 1 GM/10ML suspension Take 1 g by mouth 4 (four) times daily -  with meals and at bedtime.     tamsulosin (FLOMAX) 0.4 MG CAPS capsule Take 0.4 mg by mouth daily.     VYZULTA 0.024 % SOLN Place 1 drop into both eyes every evening.     No current facility-administered medications for this visit.  Review of Systems: GENERAL: negative for malaise, night sweats HEENT: No changes in hearing or vision, no nose bleeds or other nasal problems. NECK: Negative for lumps, goiter, pain and significant neck swelling RESPIRATORY: Negative for cough, wheezing CARDIOVASCULAR: Negative for chest pain, leg swelling, palpitations, orthopnea GI: SEE HPI MUSCULOSKELETAL: Negative for joint pain or swelling, back pain, and muscle pain. SKIN: Negative for lesions, rash PSYCH: Negative for sleep disturbance, mood disorder and recent psychosocial stressors. HEMATOLOGY Negative for prolonged bleeding, bruising easily, and swollen nodes. ENDOCRINE: Negative for cold or heat intolerance, polyuria, polydipsia and goiter. NEURO: negative for tremor, gait imbalance, syncope and seizures. The remainder of the review of systems is noncontributory.   Physical Exam: BP 115/76   Pulse 77   Temp (!) 97.4 F (36.3 C) (Oral)   Ht 5\' 6"  (1.676 m)   Wt 171 lb 4.8 oz (77.7 kg)   BMI 27.65 kg/m  GENERAL: The patient is AO x3, in no acute distress. HEENT: Head is  normocephalic and atraumatic. EOMI are intact. Mouth is well hydrated and without lesions. NECK: Supple. No masses LUNGS: Clear to auscultation. No presence of rhonchi/wheezing/rales. Adequate chest expansion HEART: RRR, normal s1 and s2. ABDOMEN: Soft, nontender, no guarding, no peritoneal signs, and nondistended. BS +. No masses. EXTREMITIES: Without any cyanosis, clubbing, rash, lesions or edema. NEUROLOGIC: AOx3, no focal motor deficit. SKIN: no jaundice, no rashes  Imaging/Labs: as above  I personally reviewed and interpreted the available labs, imaging and endoscopic files.  Impression and Plan: David Mooney is a 83 y.o. male with past medical history of GERD, ulcerative colitis, who presents for follow up of severe esophagitis and duodenal ulcer.  The patient presented an episode of severe esophagitis and duodenal ulcer which was concerning for necrosis.  Due to this reason, he was transferred to a quaternary center but no interventions were performed as he improved with medical treatment.  He has presented substantial improvement of his symptoms, although he is still eating pured diet.  We discussed in detail with the patient and the family that he will need to continue with Nexium twice daily.  I also explained the adequate fashion to take this medication.  He should also continue with Carafate 1 g every 6 hours.  I do consider that as long as he can tolerate it, he can slowly advance his diet to a soft consistency.  Since he was found to have a DVT there is requiring anticoagulation for at least 6 months, we will proceed with a diagnostic EGD in March but we will not attempt to take biopsies at that time.  Patient and family is in agreement with this.  -Continue Nexium 40 mg twice a day, take it an hour before breakfast and supper -Continue sucralfate 1 g every 6 hours -Slowly advance diet to soft consistency -Schedule EGD in March -Continue Lialda 2.4 g twice a day  All  questions were answered.      Katrinka Blazing, MD Gastroenterology and Hepatology [facility]

## 2023-02-18 NOTE — Patient Instructions (Signed)
Continue Nexium 40 mg twice a day, take it an hour before breakfast and supper Continue sucralfate 1 g every 6 hours Slowly advance diet to soft consistency Schedule EGD in March Continue Lialda 2.4 g twice a day

## 2023-02-18 NOTE — Telephone Encounter (Signed)
Patient was made aware of Sarah recommendation "there were no stones visualized on KUB. Thanks." Patient verbalized understanding.

## 2023-02-19 ENCOUNTER — Encounter (INDEPENDENT_AMBULATORY_CARE_PROVIDER_SITE_OTHER): Payer: Self-pay

## 2023-02-20 ENCOUNTER — Ambulatory Visit (HOSPITAL_COMMUNITY): Payer: Medicare Other | Attending: Family Medicine | Admitting: Speech Pathology

## 2023-02-20 ENCOUNTER — Encounter (HOSPITAL_COMMUNITY): Payer: Self-pay | Admitting: Speech Pathology

## 2023-02-20 ENCOUNTER — Ambulatory Visit (HOSPITAL_COMMUNITY)
Admission: RE | Admit: 2023-02-20 | Discharge: 2023-02-20 | Disposition: A | Discharge status: Home / Self Care | Payer: Medicare Other | Source: Ambulatory Visit | Attending: Family Medicine | Admitting: Family Medicine

## 2023-02-20 DIAGNOSIS — R1310 Dysphagia, unspecified: Secondary | ICD-10-CM | POA: Diagnosis not present

## 2023-02-20 DIAGNOSIS — R1312 Dysphagia, oropharyngeal phase: Secondary | ICD-10-CM | POA: Insufficient documentation

## 2023-02-20 DIAGNOSIS — K2210 Ulcer of esophagus without bleeding: Secondary | ICD-10-CM | POA: Diagnosis not present

## 2023-02-20 NOTE — Therapy (Signed)
[facility] Outpatient Rehabilitation at [facility] [address] [place], [place], [address] Phone: [phone]   Fax:  [phone]  Modified Barium Swallow  Patient Details  Name: David Mooney MRN: [id number] Date of Birth: [birth year] No data recorded  Encounter Date: 02/20/2023   End of Session - 02/20/23 1513     Visit Number 1    Number of Visits 1    Activity Tolerance Patient tolerated treatment well             HPI/PMH: HPI: David Mooney is a 83 y.o. male with past medical history of GERD, ulcerative colitis, who presented recently to GI for follow up of severe esophagitis and duodenal ulcer. He has been only on pureed diet since Christmas. MBSS requested   Clinical Impression: Pt's oropharyngeal swallowing was noted to be within functional limits on today's assessment. Pt was assessed with thin, NTL, HTL, puree and regular textures. Pt demonstrates timely oral prep stage with a cohesive bolus followed by a timely swallow, adequate pharyngeal stripping wave, good hyolaryngeal excursion and good airway protection. Note occasional trace to min pharyngeal residue after the swallow that is cleared by a reflexive repeat swallow. Barium tablet assessed with thin liquids and note brief stasis of the tablet in the cervical esophagus but easily passed through with one additional presentation of puree. Radiologist present to confirm. Pt was seen by Dr. Henreitta yesterday 02/19/23 who recommended slowly progressing to soft textures. (Pt has been on puree since December d/t esophageal issues). SLP reviewed findings of MBSS and reinforced esophageal precautions and provided texture suggestions to slowly introduce soft textures per GI recommendation; reviewed with Pt's wife and daughter. There are no further ST needs noted at this time. Thank you for this referral,  Factors that may increase risk of adverse event in presence of aspiration Noe & Lianne 2021): No data  recorded  Recommendations/Plan: Swallowing Evaluation Recommendations Swallowing Evaluation Recommendations Recommendations: PO diet PO Diet Recommendation: Dysphagia 1 (Pureed); Thin liquids (Level 0) (slowly advance to soft textures) Liquid Administration via: Cup; Straw Medication Administration: Whole meds with liquid Supervision: Patient able to self-feed Swallowing strategies  : Small bites/sips; Multiple dry swallows after each bite/sip; Follow solids with liquids Postural changes: Position pt fully upright for meals Oral care recommendations: Oral care BID (2x/day)    Treatment Plan Treatment Plan Treatment recommendations: No treatment recommended at this time Follow-up recommendations: No SLP follow up     Recommendations Recommendations for follow up therapy are one component of a multi-disciplinary discharge planning process, led by the attending physician.  Recommendations may be updated based on patient status, additional functional criteria and insurance authorization.  Assessment: Orofacial Exam: No data recorded  Anatomy:  Anatomy: WFL   Boluses Administered: Boluses Administered Boluses Administered: Thin liquids (Level 0); Moderately thick liquids (Level 3, honey thick); Mildly thick liquids (Level 2, nectar thick); Puree; Solid     Oral Impairment Domain: Oral Impairment Domain Lip Closure: No labial escape Tongue control during bolus hold: Cohesive bolus between tongue to palatal seal Bolus preparation/mastication: Timely and efficient chewing and mashing Bolus transport/lingual motion: Brisk tongue motion Oral residue: Complete oral clearance Initiation of pharyngeal swallow : Posterior angle of the ramus     Pharyngeal Impairment Domain: Pharyngeal Impairment Domain Soft palate elevation: No bolus between soft palate (SP)/pharyngeal wall (PW) Laryngeal elevation: Complete superior movement of thyroid  cartilage with complete approximation  of arytenoids to epiglottic petiole Anterior hyoid excursion: Complete anterior movement Epiglottic movement: Complete  inversion Laryngeal vestibule closure: Complete, no air/contrast in laryngeal vestibule Pharyngeal stripping wave : Present - complete Pharyngeal contraction (A/P view only): N/A Pharyngoesophageal segment opening: Complete distension and complete duration, no obstruction of flow Tongue base retraction: No contrast between tongue base and posterior pharyngeal wall (PPW) Pharyngeal residue: Complete pharyngeal clearance Location of pharyngeal residue: Tongue base     Esophageal Impairment Domain: Esophageal Impairment Domain Esophageal clearance upright position: Complete clearance, esophageal coating    Pill: Pill Consistency administered: Thin liquids (Level 0)    Penetration/Aspiration Scale Score: Penetration/Aspiration Scale Score 1.  Material does not enter airway: Thin liquids (Level 0); Mildly thick liquids (Level 2, nectar thick); Moderately thick liquids (Level 3, honey thick); Puree; Solid; Pill    Compensatory Strategies: Compensatory Strategies Compensatory strategies: No       General Information: Caregiver present: Yes   Diet Prior to this Study: Dysphagia 1 (pureed); Thin liquids (Level 0)    Temperature : Normal    Respiratory Status: WFL    Supplemental O2: None (Room air)    History of Recent Intubation: No   Behavior/Cognition: Alert; Cooperative; Pleasant mood  Self-Feeding Abilities: Able to self-feed  Baseline vocal quality/speech: Normal  Volitional Cough: Able to elicit  Volitional Swallow: Able to elicit  No data recorded  Goal Planning: No data recorded No data recorded No data recorded No data recorded No data recorded  Pain: No data recorded  End of Session: Start Time:No data recorded Stop Time: No data recorded Time Calculation:No data recorded Charges: No data recorded SLP visit diagnosis:  SLP Visit Diagnosis: Dysphagia, unspecified (R13.10)    Past Medical History:  Past Medical History:  Diagnosis Date   Arthritis    Cataract    Complication of anesthesia    GERD (gastroesophageal reflux disease)    Glaucoma    Hernia of unspecified site of abdominal cavity without mention of obstruction or gangrene    History of gastric ulcer    PONV (postoperative nausea and vomiting)    Ulcerative colitis    Past Surgical History:  Past Surgical History:  Procedure Laterality Date   BIOPSY  12/04/2018   Procedure: BIOPSY;  Surgeon: Golda Claudis PENNER, MD;  [facility];  Service: Endoscopy;;  cecum   COLONOSCOPY     COLONOSCOPY N/A 08/27/2012   Procedure: COLONOSCOPY;  Surgeon: Claudis PENNER Golda, MD;  [facility];  Service: Endoscopy;  Laterality: N/A;  1200-moved to 13:15 Ann to notify pt   COLONOSCOPY N/A 12/04/2018   Procedure: COLONOSCOPY;  Surgeon: Golda Claudis PENNER, MD;  [facility];  Service: Endoscopy;  Laterality: N/A;  155pm   CYSTOSCOPY WITH INSERTION OF UROLIFT N/A 04/30/2022   Procedure: CYSTOSCOPY WITH INSERTION OF UROLIFT;  Surgeon: Sherrilee Belvie CROME, MD;  Location: [facility];  Service: Urology;  Laterality: N/A;   ESOPHAGOGASTRODUODENOSCOPY (EGD) WITH PROPOFOL   01/04/2023   Procedure: ESOPHAGOGASTRODUODENOSCOPY (EGD) WITH PROPOFOL ;  Surgeon: Cindie Carlin POUR, DO;  [facility];  Service: Endoscopy;;   HERNIA REPAIR Left    MOLE REMOVAL     From head , chest   UPPER GASTROINTESTINAL ENDOSCOPY     Deamber Buckhalter H. Clois KILLIAN, CCC-SLP Speech Language Pathologist  Raguel VEAR Clois 02/20/2023, 3:15 PM  Raguel VEAR Clois, CCC-SLP 02/20/2023, 3:14 PM  [facility] Outpatient Rehabilitation at [facility] [address] [place], [place], [address] Phone: [phone]   Fax:  [phone]  Name: David Mooney MRN: [id number] Date of Birth:  04/02/1940  

## 2023-03-11 DIAGNOSIS — K080 Exfoliation of teeth due to systemic causes: Secondary | ICD-10-CM | POA: Diagnosis not present

## 2023-03-21 DIAGNOSIS — H02135 Senile ectropion of left lower eyelid: Secondary | ICD-10-CM | POA: Diagnosis not present

## 2023-03-21 DIAGNOSIS — H04123 Dry eye syndrome of bilateral lacrimal glands: Secondary | ICD-10-CM | POA: Diagnosis not present

## 2023-03-21 DIAGNOSIS — H401131 Primary open-angle glaucoma, bilateral, mild stage: Secondary | ICD-10-CM | POA: Diagnosis not present

## 2023-03-21 DIAGNOSIS — H02132 Senile ectropion of right lower eyelid: Secondary | ICD-10-CM | POA: Diagnosis not present

## 2023-03-26 DIAGNOSIS — N401 Enlarged prostate with lower urinary tract symptoms: Secondary | ICD-10-CM | POA: Diagnosis not present

## 2023-03-26 DIAGNOSIS — E782 Mixed hyperlipidemia: Secondary | ICD-10-CM | POA: Diagnosis not present

## 2023-03-26 DIAGNOSIS — D649 Anemia, unspecified: Secondary | ICD-10-CM | POA: Diagnosis not present

## 2023-03-26 DIAGNOSIS — Z6825 Body mass index (BMI) 25.0-25.9, adult: Secondary | ICD-10-CM | POA: Diagnosis not present

## 2023-03-26 DIAGNOSIS — G4733 Obstructive sleep apnea (adult) (pediatric): Secondary | ICD-10-CM | POA: Diagnosis not present

## 2023-03-26 DIAGNOSIS — Z0001 Encounter for general adult medical examination with abnormal findings: Secondary | ICD-10-CM | POA: Diagnosis not present

## 2023-04-02 ENCOUNTER — Telehealth (INDEPENDENT_AMBULATORY_CARE_PROVIDER_SITE_OTHER): Payer: Self-pay | Admitting: Gastroenterology

## 2023-04-02 NOTE — Pre-Procedure Instructions (Signed)
  RE: eliquis Received: Today Marlowe Shores, LPN  Elsie Amis, RN [place] Faster! On the encounter form from his 02/18/23 visit, Dr. Salena Saner put no need to stop anticoagulation.       Previous Messages

## 2023-04-02 NOTE — Pre-Procedure Instructions (Signed)
  eliquis Received: Today Elsie Amis, RN  Estudillo, Ewell Poe, CMA; Marlowe Shores, LPN; Marguerita Merles, Reuel Boom, MD Good afternoon. I am reviewing Dyllin Llerena's chart and see that he is on eliquis, but have been unable to see anywhere that he needs to hold this before procedure. Will you review this for mr Dr Levon Hedger? I may not be looking at everything correctly. Thank you!Marland Kitchen

## 2023-04-02 NOTE — Telephone Encounter (Signed)
 You're welcome! ===View-only below this line=== ----- Message ----- From: David Amis, RN Sent: 04/02/2023   3:28 PM EDT To: David Shores, LPN Subject: RE: eliquis                                    Okay ! Thank you! I must have skipped right over that!! ----- Message ----- From: David Shores, LPN Sent: 1/61/0960   3:25 PM EDT To: David Amis, RN Subject: RE: David Mooney! On the encounter form from his 02/18/23 visit, David. Salena Saner put no need to stop anticoagulation. ----- Message ----- From: David Amis, RN Sent: 04/02/2023   3:02 PM EDT To: David Mooney, CMA; David Shores, LPN; * Subject: eliquis                                        Good afternoon. I am reviewing David Mooney's chart and see that he is on eliquis, but have been unable to see anywhere that he needs to hold this before procedure. Will you review this for [name]? I may not be looking at everything correctly. Thank you!Marland Kitchen

## 2023-04-04 ENCOUNTER — Encounter (HOSPITAL_COMMUNITY)
Admission: RE | Admit: 2023-04-04 | Discharge: 2023-04-04 | Disposition: A | Discharge status: Home / Self Care | Payer: Medicare Other | Source: Ambulatory Visit | Attending: Gastroenterology | Admitting: Gastroenterology

## 2023-04-08 ENCOUNTER — Telehealth (INDEPENDENT_AMBULATORY_CARE_PROVIDER_SITE_OTHER): Payer: Self-pay | Admitting: Gastroenterology

## 2023-04-08 NOTE — Telephone Encounter (Signed)
 Pt called in and states he has diarrhea. Pt has EGD in the morning. Woke up this morning with diarrhea. No fever or other symptoms and feels fine. Pt states that he believes he is slowing down. Advised pt that if diarrhea gets worse or if he wakes up in the morning and can not come to EGD appt, please call the hospital.

## 2023-04-09 ENCOUNTER — Encounter (HOSPITAL_COMMUNITY)
Admission: RE | Disposition: A | Discharge status: Home / Self Care | Payer: Self-pay | Source: Home / Self Care | Attending: Gastroenterology

## 2023-04-09 ENCOUNTER — Ambulatory Visit (HOSPITAL_COMMUNITY)
Admission: RE | Admit: 2023-04-09 | Discharge: 2023-04-09 | Disposition: A | Discharge status: Home / Self Care | Payer: Medicare Other | Attending: Gastroenterology | Admitting: Gastroenterology

## 2023-04-09 ENCOUNTER — Encounter (HOSPITAL_COMMUNITY): Payer: Self-pay | Admitting: Gastroenterology

## 2023-04-09 ENCOUNTER — Ambulatory Visit (HOSPITAL_COMMUNITY): Admitting: Anesthesiology

## 2023-04-09 DIAGNOSIS — K259 Gastric ulcer, unspecified as acute or chronic, without hemorrhage or perforation: Secondary | ICD-10-CM | POA: Diagnosis not present

## 2023-04-09 DIAGNOSIS — I10 Essential (primary) hypertension: Secondary | ICD-10-CM | POA: Diagnosis not present

## 2023-04-09 DIAGNOSIS — K5190 Ulcerative colitis, unspecified, without complications: Secondary | ICD-10-CM | POA: Insufficient documentation

## 2023-04-09 DIAGNOSIS — K209 Esophagitis, unspecified without bleeding: Secondary | ICD-10-CM

## 2023-04-09 DIAGNOSIS — K2100 Gastro-esophageal reflux disease with esophagitis, without bleeding: Secondary | ICD-10-CM | POA: Diagnosis not present

## 2023-04-09 DIAGNOSIS — Z7901 Long term (current) use of anticoagulants: Secondary | ICD-10-CM | POA: Diagnosis not present

## 2023-04-09 HISTORY — PX: ESOPHAGOGASTRODUODENOSCOPY (EGD) WITH PROPOFOL: SHX5813

## 2023-04-09 SURGERY — ESOPHAGOGASTRODUODENOSCOPY (EGD) WITH PROPOFOL
Anesthesia: General

## 2023-04-09 MED ORDER — ESOMEPRAZOLE MAGNESIUM 40 MG PO CPDR: 40.0000 mg | DELAYED_RELEASE_CAPSULE | Freq: Two times a day (BID) | ORAL | 5 refills | Status: DC

## 2023-04-09 MED ORDER — SUCRALFATE 1 GM/10ML PO SUSP: 1.0000 g | Freq: Three times a day (TID) | ORAL | 0 refills | Status: DC

## 2023-04-09 MED ORDER — LACTATED RINGERS IV SOLN: INTRAVENOUS | Status: DC | PRN

## 2023-04-09 MED ORDER — PROPOFOL 10 MG/ML IV BOLUS
INTRAVENOUS | Status: DC | PRN
  Administered 2023-04-09 (×2): 40 mg via INTRAVENOUS

## 2023-04-09 MED ORDER — LIDOCAINE HCL (CARDIAC) PF 100 MG/5ML IV SOSY
PREFILLED_SYRINGE | INTRAVENOUS | Status: DC | PRN
  Administered 2023-04-09: 50 mg via INTRAVENOUS

## 2023-04-09 NOTE — H&P (Signed)
 David Mooney is an 83 y.o. male.   Chief Complaint: severe esophagitis and duodenal ulcer. HPI: David Mooney is a 83 y.o. male with past medical history of GERD, ulcerative colitis, who presents for follow up of severe esophagitis and duodenal ulcer.   The patient denies having any nausea, vomiting, fever, chills, hematochezia, melena, hematemesis, abdominal distention, abdominal pain, diarrhea, jaundice, pruritus or weight loss.   Past Medical History:  Diagnosis Date  . Arthritis   . Cataract   . Complication of anesthesia   . GERD (gastroesophageal reflux disease)   . Glaucoma   . Hernia of unspecified site of abdominal cavity without mention of obstruction or gangrene   . History of gastric ulcer   . PONV (postoperative nausea and vomiting)   . Ulcerative colitis     Past Surgical History:  Procedure Laterality Date  . BIOPSY  12/04/2018   Procedure: BIOPSY;  Surgeon: Malissa Hippo, MD;  [facility];  Service: Endoscopy;;  cecum  . COLONOSCOPY    . COLONOSCOPY N/A 08/27/2012   Procedure: COLONOSCOPY;  Surgeon: Malissa Hippo, MD;  [facility];  Service: Endoscopy;  Laterality: N/A;  1200-moved to 13:15 Ann to notify pt  . COLONOSCOPY N/A 12/04/2018   Procedure: COLONOSCOPY;  Surgeon: Malissa Hippo, MD;  [facility];  Service: Endoscopy;  Laterality: N/A;  155pm  . CYSTOSCOPY WITH INSERTION OF UROLIFT N/A 04/30/2022   Procedure: CYSTOSCOPY WITH INSERTION OF UROLIFT;  Surgeon: Malen Gauze, MD;  Location: [facility];  Service: Urology;  Laterality: N/A;  . ESOPHAGOGASTRODUODENOSCOPY (EGD) WITH PROPOFOL  01/04/2023   Procedure: ESOPHAGOGASTRODUODENOSCOPY (EGD) WITH PROPOFOL;  Surgeon: Lanelle Bal, DO;  [facility];  Service: Endoscopy;;  . HERNIA REPAIR Left   . MOLE REMOVAL     From head , chest  . UPPER GASTROINTESTINAL ENDOSCOPY      Family History  Problem Relation Age of Onset  . Arrhythmia Mother   .  Anuerysm Father   . Hypertension Father   . Diabetes Father   . Healthy Sister   . Healthy Sister   . Irritable bowel syndrome Daughter   . Thyroid disease Daughter    Social History:  reports that he has never smoked. He has never used smokeless tobacco. He reports that he does not drink alcohol and does not use drugs.  Allergies: No Known Allergies  Medications Prior to Admission  Medication Sig Dispense Refill  . apixaban (ELIQUIS) 5 MG TABS tablet Take 5 mg by mouth 2 (two) times daily.    Marland Kitchen atorvastatin (LIPITOR) 20 MG tablet Take 20 mg by mouth daily.    Marland Kitchen esomeprazole (NEXIUM) 40 MG capsule Take 1 capsule (40 mg total) by mouth 2 (two) times daily before a meal. 60 capsule 5  . finasteride (PROSCAR) 5 MG tablet TAKE 1 TABLET(5 MG) BY MOUTH DAILY 90 tablet 3  . fluticasone (FLONASE) 50 MCG/ACT nasal spray Place 2 sprays into both nostrils daily as needed for allergies.    . mesalamine (LIALDA) 1.2 g EC tablet TAKE 2 TABLETS(2.4 GRAMS) BY MOUTH TWICE DAILY 360 tablet 3  . mirabegron ER (MYRBETRIQ) 25 MG TB24 tablet Take 1 tablet (25 mg total) by mouth daily. 30 tablet 11  . Multiple Vitamin (MULTIVITAMIN) tablet Take 1 tablet by mouth daily.    . sucralfate (CARAFATE) 1 GM/10ML suspension Take 1 g by mouth 4 (four) times daily -  with meals and at  bedtime.    . tamsulosin (FLOMAX) 0.4 MG CAPS capsule Take 0.4 mg by mouth daily.    Marland Kitchen VYZULTA 0.024 % SOLN Place 1 drop into both eyes every evening.      No results found for this or any previous visit (from the past 48 hours). No results found.  Review of Systems  All other systems reviewed and are negative.   Blood pressure (!) 140/68, pulse 64, temperature 98.1 F (36.7 C), resp. rate 12, SpO2 98%. Physical Exam  GENERAL: The patient is AO x3, in no acute distress. HEENT: Head is normocephalic and atraumatic. EOMI are intact. Mouth is well hydrated and without lesions. NECK: Supple. No masses LUNGS: Clear to auscultation.  No presence of rhonchi/wheezing/rales. Adequate chest expansion HEART: RRR, normal s1 and s2. ABDOMEN: Soft, nontender, no guarding, no peritoneal signs, and nondistended. BS +. No masses. EXTREMITIES: Without any cyanosis, clubbing, rash, lesions or edema. NEUROLOGIC: AOx3, no focal motor deficit. SKIN: no jaundice, no rashes  Assessment/Plan David Mooney is a 83 y.o. male with past medical history of GERD, ulcerative colitis, who presents for follow up of severe esophagitis and duodenal ulcer.  We will proceed with EGD  Dolores Frame, MD 04/09/2023, 7:41 AM

## 2023-04-09 NOTE — Transfer of Care (Signed)
 Immediate Anesthesia Transfer of Care Note  Patient: David Mooney  Procedure(s) Performed: ESOPHAGOGASTRODUODENOSCOPY (EGD) WITH PROPOFOL  Patient Location: Short Stay  Anesthesia Type:General  Level of Consciousness: awake, alert , oriented, and patient cooperative  Airway & Oxygen Therapy: Patient Spontanous Breathing  Post-op Assessment: Report given to RN, Post -op Vital signs reviewed and stable, and Patient moving all extremities X 4  Post vital signs: Reviewed and stable  Last Vitals:  Vitals Value Taken Time  BP 88/50 04/09/23 0800  Temp 36.5 C 04/09/23 0800  Pulse 66 04/09/23 0800  Resp 17 04/09/23 0800  SpO2 98 % 04/09/23 0800    Last Pain:  Vitals:   04/09/23 0800  TempSrc: Axillary  PainSc: Asleep         Complications: No notable events documented.

## 2023-04-09 NOTE — Anesthesia Preprocedure Evaluation (Signed)
 Anesthesia Evaluation  Patient identified by MRN, date of birth, ID band Patient awake    Reviewed: Allergy & Precautions, H&P , NPO status , Patient's Chart, lab work & pertinent test results, reviewed documented beta blocker date and time   History of Anesthesia Complications (+) PONV and history of anesthetic complications  Airway Mallampati: II  TM Distance: >3 FB Neck ROM: full    Dental no notable dental hx.    Pulmonary neg pulmonary ROS   Pulmonary exam normal breath sounds clear to auscultation       Cardiovascular Exercise Tolerance: Good hypertension, negative cardio ROS  Rhythm:regular Rate:Normal     Neuro/Psych negative neurological ROS  negative psych ROS   GI/Hepatic Neg liver ROS, PUD,GERD  ,,  Endo/Other  negative endocrine ROS    Renal/GU Renal disease  negative genitourinary   Musculoskeletal   Abdominal   Peds  Hematology  (+) Blood dyscrasia, anemia   Anesthesia Other Findings   Reproductive/Obstetrics negative OB ROS                             Anesthesia Physical Anesthesia Plan  ASA: 2  Anesthesia Plan: General   Post-op Pain Management:    Induction:   PONV Risk Score and Plan: Propofol infusion  Airway Management Planned:   Additional Equipment:   Intra-op Plan:   Post-operative Plan:   Informed Consent: I have reviewed the patients History and Physical, chart, labs and discussed the procedure including the risks, benefits and alternatives for the proposed anesthesia with the patient or authorized representative who has indicated his/her understanding and acceptance.     Dental Advisory Given  Plan Discussed with: CRNA  Anesthesia Plan Comments:        Anesthesia Quick Evaluation

## 2023-04-09 NOTE — Op Note (Signed)
 [employer] Patient Name: David Mooney Procedure Date: 04/09/2023 7:18 AM MRN: [id number] Date of Birth: 03/25/1940 Attending MD: Katrinka Blazing , , [id number] CSN: [id number] Age: 83 Admit Type: Outpatient Procedure:                Upper GI endoscopy Indications:              Reflux esophagitis, Acute duodenal ulcer Providers:                Katrinka Blazing, Buel Ream. Museum/gallery exhibitions officer, Charity fundraiser,                            Judeth Cornfield. Jessee Avers, Technician Referring MD:              Medicines:                Monitored Anesthesia Care Complications:            No immediate complications. Estimated Blood Loss:     Estimated blood loss: none. Procedure:                Pre-Anesthesia Assessment:                           - Prior to the procedure, a History and Physical                            was performed, and patient medications, allergies                            and sensitivities were reviewed. The patient's                            tolerance of previous anesthesia was reviewed.                           - The risks and benefits of the procedure and the                            sedation options and risks were discussed with the                            patient. All questions were answered and informed                            consent was obtained.                           - ASA Grade Assessment: III - A patient with severe                            systemic disease.                           After obtaining informed consent, the endoscope was                            passed under direct  vision. Throughout the                            procedure, the patient's blood pressure, pulse, and                            oxygen saturations were monitored continuously. The                            [id number]-H190 ([id number]) scope was introduced through the                            mouth, and advanced to the second part of duodenum.                            The upper GI endoscopy  was accomplished without                            difficulty. The patient tolerated the procedure                            well. Scope In: 7:51:48 AM Scope Out: 7:56:36 AM Total Procedure Duration: 0 hours 4 minutes 48 seconds  Findings:      The examined esophagus was normal.      One partially obstructing non-bleeding superficial gastric ulcer of       significant severity with a clean ulcer base (Forrest Class III) was       found in the gastric antrum. The lesion was 10 mm in largest dimension.      The examined duodenum was normal. Impression:               - Normal esophagus.                           - Partially obstructing non-bleeding gastric ulcer                            with a clean ulcer base (Forrest Class III).                           - Normal examined duodenum.                           - No specimens collected. Moderate Sedation:      Per Anesthesia Care Recommendation:           - Discharge patient to home (ambulatory).                           - Resume previous diet.                           - Continue present medications.                           - Repeat upper endoscopy in 3 months for  surveillance. Procedure Code(s):        --- Professional ---                           [phone], Esophagogastroduodenoscopy, flexible,                            transoral; diagnostic, including collection of                            specimen(s) by brushing or washing, when performed                            (separate procedure) Diagnosis Code(s):        --- Professional ---                           K25.9, Gastric ulcer, unspecified as acute or                            chronic, without hemorrhage or perforation                           K21.00, Gastro-esophageal reflux disease with                            esophagitis, without bleeding                           K26.3, Acute duodenal ulcer without hemorrhage or                             perforation CPT copyright 2022 American Medical Association. All rights reserved. The codes documented in this report are preliminary and upon coder review may  be revised to meet current compliance requirements. Katrinka Blazing, MD Katrinka Blazing,  04/09/2023 8:03:53 AM This report has been signed electronically. Number of Addenda: 0

## 2023-04-09 NOTE — Discharge Instructions (Addendum)
You are being discharged to home.  Resume your previous diet.  Continue your present medications.  Your physician has recommended a repeat upper endoscopy in three months for surveillance.

## 2023-04-10 ENCOUNTER — Encounter (HOSPITAL_COMMUNITY): Payer: Self-pay | Admitting: Gastroenterology

## 2023-04-11 NOTE — Anesthesia Postprocedure Evaluation (Signed)
 Anesthesia Post Note  Patient: David Mooney  Procedure(s) Performed: ESOPHAGOGASTRODUODENOSCOPY (EGD) WITH PROPOFOL  Patient location during evaluation: Phase II Anesthesia Type: General Level of consciousness: awake Pain management: pain level controlled Vital Signs Assessment: post-procedure vital signs reviewed and stable Respiratory status: spontaneous breathing and respiratory function stable Cardiovascular status: blood pressure returned to baseline and stable Postop Assessment: no headache and no apparent nausea or vomiting Anesthetic complications: no Comments: Late entry   No notable events documented.   Last Vitals:  Vitals:   04/09/23 0645 04/09/23 0800  BP:  (!) 88/50  Pulse: 64 66  Resp: 12 17  Temp:  36.5 C  SpO2: 98% 98%    Last Pain:  Vitals:   04/10/23 1439  TempSrc:   PainSc: 0-No pain                 Windell Norfolk

## 2023-04-23 ENCOUNTER — Other Ambulatory Visit: Payer: Self-pay

## 2023-04-23 DIAGNOSIS — R3915 Urgency of urination: Secondary | ICD-10-CM

## 2023-04-23 DIAGNOSIS — R351 Nocturia: Secondary | ICD-10-CM

## 2023-04-23 DIAGNOSIS — N401 Enlarged prostate with lower urinary tract symptoms: Secondary | ICD-10-CM

## 2023-04-23 MED ORDER — FINASTERIDE 5 MG PO TABS: 5.0000 mg | ORAL_TABLET | Freq: Every day | ORAL | 3 refills | Status: DC

## 2023-05-20 ENCOUNTER — Encounter (INDEPENDENT_AMBULATORY_CARE_PROVIDER_SITE_OTHER): Payer: Self-pay | Admitting: Gastroenterology

## 2023-05-20 ENCOUNTER — Ambulatory Visit (INDEPENDENT_AMBULATORY_CARE_PROVIDER_SITE_OTHER): Payer: Medicare Other | Admitting: Gastroenterology

## 2023-05-20 VITALS — BP 116/67 | HR 74 | Temp 97.5°F | Ht 66.0 in | Wt 170.7 lb

## 2023-05-20 DIAGNOSIS — K5100 Ulcerative (chronic) pancolitis without complications: Secondary | ICD-10-CM

## 2023-05-20 DIAGNOSIS — K269 Duodenal ulcer, unspecified as acute or chronic, without hemorrhage or perforation: Secondary | ICD-10-CM | POA: Diagnosis not present

## 2023-05-20 DIAGNOSIS — K5190 Ulcerative colitis, unspecified, without complications: Secondary | ICD-10-CM | POA: Diagnosis not present

## 2023-05-20 MED ORDER — SUCRALFATE 1 GM/10ML PO SUSP: 1.0000 g | Freq: Three times a day (TID) | ORAL | 1 refills | Status: DC

## 2023-05-20 NOTE — Patient Instructions (Addendum)
 Continue sucralfate  1 g every 6 hours Continue esomeprazole  40 mg BID. Continue Lialda  2.4 g twice a day Schedule EGD in late June/beginning of July Please follow-up with PCP closely regarding anticoagulation Perform blood workup

## 2023-05-20 NOTE — Progress Notes (Signed)
 Samantha Cress, M.D. Gastroenterology & Hepatology [facility] Gastroenterology [address] [place], [place] [address]  Primary Care Physician: Alston Jerry, MD [address] [place] [address]  I will communicate my assessment and recommendations to the referring MD via EMR.  Problems: Severe esophagitis, resolved Large duodenal ulcer, improving but still present Ulcerative colitis   History of Present Illness: David Mooney is a 83 y.o. 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  The patient was last seen on 02/18/2023. At that time, the patient was advised to continue Nexium  40 mg twice a day and Carafate , was scheduled for EGD.  He was continued on Lialda  2.4 g twice a day.  Patient denies any complaints.  Patient denies having any complaints. The patient denies having any nausea, vomiting, fever, chills, hematochezia, melena, hematemesis, abdominal distention, abdominal pain, diarrhea, jaundice, pruritus or weight loss.  Patient is currently taking Lialda  2.4 g twice a day. Still on Eliquis.  Still taking sucralfate  and pantoprazole  40 mg BID.  Does not take any NSAIDs or high dose aspirin.  Last flu shot:possibly 2024 Last pneumonia shot:had it in the past, does not know when Last zoster vaccine: had it in the past, does not know when COVID-19 shot: 2 shots and some boosters  Last EGD: 04/09/2023 - Normal esophagus. - Partially obstructing non- bleeding gastric ulcer with a clean ulcer base ( Forrest Class III) . - Normal examined duodenum.  Recommend a repeat EGD in 3 months.  Last Colonoscopy: 12/04/2018 Normal terminal ileum Area of mildly congested mucosa in the cecum which was biopsied (mildly active chronic colitis), multiple scars in the colon related to healed colitis, 2 small polyps in the  splenic flexure.  Pathology consistent with tubular adenomas. Recommended repeat colonoscopy in 5 years.  Past Medical History: Past Medical History:  Diagnosis Date   Arthritis    Cataract    Complication of anesthesia    GERD (gastroesophageal reflux disease)    Glaucoma    Hernia of unspecified site of abdominal cavity without mention of obstruction or gangrene    History of gastric ulcer    PONV (postoperative nausea and vomiting)    Ulcerative colitis     Past Surgical History: Past Surgical History:  Procedure Laterality Date   BIOPSY  12/04/2018   Procedure: BIOPSY;  Surgeon: Ruby Corporal, MD;  [facility];  Service: Endoscopy;;  cecum   COLONOSCOPY     COLONOSCOPY N/A 08/27/2012   Procedure: COLONOSCOPY;  Surgeon: Ruby Corporal, MD;  Location: [facility];  Service: Endoscopy;  Laterality: N/A;  1200-moved to 13:15 Ann to notify pt   COLONOSCOPY N/A 12/04/2018   Procedure: COLONOSCOPY;  Surgeon: Ruby Corporal, MD;  [facility]: [facility];  Service: Endoscopy;  Laterality: N/A;  155pm   CYSTOSCOPY WITH INSERTION OF UROLIFT N/A 04/30/2022   Procedure: CYSTOSCOPY WITH INSERTION OF UROLIFT;  Surgeon: Marco Severs, MD;  Location: [facility];  Service: Urology;  Laterality: N/A;   ESOPHAGOGASTRODUODENOSCOPY (EGD) WITH PROPOFOL   01/04/2023   Procedure: ESOPHAGOGASTRODUODENOSCOPY (EGD) WITH PROPOFOL ;  Surgeon: Vinetta Greening, DO;  [facility];  Service: Endoscopy;;   ESOPHAGOGASTRODUODENOSCOPY (EGD) WITH PROPOFOL  N/A 04/09/2023   Procedure: ESOPHAGOGASTRODUODENOSCOPY (EGD) WITH PROPOFOL ;  Surgeon: Urban Garden, MD;  Location: [facility];  Service: Gastroenterology;  Laterality: N/A;  7:30am;asa 3   HERNIA REPAIR Left    MOLE REMOVAL     From head , chest   UPPER GASTROINTESTINAL ENDOSCOPY      Family History: Family History  Problem Relation Age of Onset   Arrhythmia Mother    Anuerysm Father    Hypertension Father     Diabetes Father    Healthy Sister    Healthy Sister    Irritable bowel syndrome Daughter    Thyroid  disease Daughter     Social History: Social History   Tobacco Use  Smoking Status Never  Smokeless Tobacco Never   Social History   Substance and Sexual Activity  Alcohol  Use No   Social History   Substance and Sexual Activity  Drug Use No    Allergies: No Known Allergies  Medications: Current Outpatient Medications  Medication Sig Dispense Refill   apixaban (ELIQUIS) 5 MG TABS tablet Take 5 mg by mouth 2 (two) times daily.     atorvastatin (LIPITOR) 20 MG tablet Take 20 mg by mouth daily.     esomeprazole  (NEXIUM ) 40 MG capsule Take 1 capsule (40 mg total) by mouth 2 (two) times daily before a meal. 60 capsule 5   finasteride  (PROSCAR ) 5 MG tablet Take 1 tablet (5 mg total) by mouth daily. TAKE 1 TABLET(5 MG) BY MOUTH DAILY 90 tablet 3   mesalamine  (LIALDA ) 1.2 g EC tablet TAKE 2 TABLETS(2.4 GRAMS) BY MOUTH TWICE DAILY 360 tablet 3   mirabegron  ER (MYRBETRIQ ) 25 MG TB24 tablet Take 1 tablet (25 mg total) by mouth daily. 30 tablet 11   Multiple Vitamin (MULTIVITAMIN) tablet Take 1 tablet by mouth daily.     sucralfate  (CARAFATE ) 1 GM/10ML suspension Take 10 mLs (1 g total) by mouth 4 (four) times daily -  with meals and at bedtime. 420 mL 0   tamsulosin  (FLOMAX ) 0.4 MG CAPS capsule Take 0.4 mg by mouth daily.     VYZULTA 0.024 % SOLN Place 1 drop into both eyes every evening.     fluticasone (FLONASE) 50 MCG/ACT nasal spray Place 2 sprays into both nostrils daily as needed for allergies. (Patient not taking: Reported on 05/20/2023)     No current facility-administered medications for this visit.    Review of Systems: GENERAL: negative for malaise, night sweats HEENT: No changes in hearing or vision, no nose bleeds or other nasal problems. NECK: Negative for lumps, goiter, pain and significant neck swelling RESPIRATORY: Negative for cough, wheezing CARDIOVASCULAR:  Negative for chest pain, leg swelling, palpitations, orthopnea GI: SEE HPI MUSCULOSKELETAL: Negative for joint pain or swelling, back pain, and muscle pain. SKIN: Negative for lesions, rash PSYCH: Negative for sleep disturbance, mood disorder and recent psychosocial stressors. HEMATOLOGY Negative for prolonged bleeding, bruising easily, and swollen nodes. ENDOCRINE: Negative for cold or heat intolerance, polyuria, polydipsia and goiter. NEURO: negative for tremor, gait imbalance, syncope and seizures. The remainder of the review of systems is noncontributory.   Physical Exam: BP 116/67 (BP Location: Left Arm, Patient Position: Sitting, Cuff Size: Normal)   Pulse 74   Temp (!) 97.5 F (36.4 C) (Temporal)   Ht 5\' 6"  (1.676 m)   Wt 170 lb 11.2 oz (77.4 kg)   BMI 27.55 kg/m  GENERAL: The patient is AO x3, in no acute distress. HEENT: Head is normocephalic and atraumatic. EOMI are intact. Mouth is well hydrated and without lesions. NECK: Supple. No masses LUNGS: Clear to auscultation. No presence of rhonchi/wheezing/rales. Adequate chest expansion HEART: RRR, normal  s1 and s2. ABDOMEN: Soft, nontender, no guarding, no peritoneal signs, and nondistended. BS +. No masses. EXTREMITIES: Without any cyanosis, clubbing, rash, lesions or edema. NEUROLOGIC: AOx3, no focal motor deficit. SKIN: no jaundice, no rashes  Imaging/Labs: as above  I personally reviewed and interpreted the available labs, imaging and endoscopic files.  Impression and Plan: David Mooney is a 83 y.o. 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  The patient has had significant improvement of his initial symptoms of peptic ulcer disease and esophagitis with the use of a combination of PPI and sucralfate .  In fact, he had significant improvement of his endoscopic findings.  Will  continue his current regimen until his repeat endoscopy in late June/beginning of July when we repeat his EGD.  Ideally, this should be performed off anticoagulation for which he should follow close with his PCP.  His ulcerative colitis has been well-controlled and is clinically in remission.  Will continue with Lialda  twice daily for now.  Will obtain surveillance labs as well.  -Continue sucralfate  1 g every 6 hours -Continue esomeprazole  40 mg BID. -Continue Lialda  2.4 g twice a day -Schedule EGD in late June/beginning of July -Patient needs to follow-up with PCP closely regarding anticoagulation -Check CBC and BMP  All questions were answered.      Samantha Cress, MD Gastroenterology and Hepatology [facility] Gastroenterology

## 2023-05-22 ENCOUNTER — Telehealth (INDEPENDENT_AMBULATORY_CARE_PROVIDER_SITE_OTHER): Payer: Self-pay | Admitting: Gastroenterology

## 2023-05-22 NOTE — Telephone Encounter (Signed)
 Pt daughter Lovett Ruck called in and states that they would repeat EGD to be completed on 07/09/23 if possible. Pt daughter also states she contacted Dr.Burdine office to check about Eliquis; pt will complete Eliquis course on 07/03/23 or 07/04/23 per daughter.

## 2023-05-24 ENCOUNTER — Encounter (INDEPENDENT_AMBULATORY_CARE_PROVIDER_SITE_OTHER): Payer: Self-pay | Admitting: *Deleted

## 2023-05-24 NOTE — Telephone Encounter (Signed)
 Pt daughter contacted and pt scheduled for 6/24 at 7:30. Instructions will be mailed once pre op is received. No PA needed per [employer].

## 2023-06-28 ENCOUNTER — Other Ambulatory Visit: Payer: Self-pay | Admitting: Urology

## 2023-07-05 ENCOUNTER — Encounter (HOSPITAL_COMMUNITY): Payer: Self-pay

## 2023-07-05 ENCOUNTER — Encounter (HOSPITAL_COMMUNITY)
Admission: RE | Admit: 2023-07-05 | Discharge: 2023-07-05 | Disposition: A | Discharge status: Home / Self Care | Source: Ambulatory Visit | Attending: Gastroenterology | Admitting: Gastroenterology

## 2023-07-05 HISTORY — DX: Personal history of urinary calculi: Z87.442

## 2023-07-05 NOTE — Patient Instructions (Signed)
 David Mooney  07/05/2023     @PREFPERIOPPHARMACY @   Your procedure is scheduled on 07/09/2023.   Report to [facility] at  0600 A.M.   Call this number if you have problems the morning of surgery:  [phone]  If you experience any cold or flu symptoms such as cough, fever, chills, shortness of breath, etc. between now and your scheduled surgery, please notify us  at the above number.   Remember:        Your last dose of eliquis should be on 07/04/2023.   Follow the diet instructions given to you by the office.   You may drink clear liquids until 0330 am on 07/09/2023.    Clear liquids allowed are:                    Water , Juice (No red color; non-citric and without pulp; diabetics please choose diet or no sugar options), Carbonated beverages (diabetics please choose diet or no sugar options), Clear Tea (No creamer, milk, or cream, including half & half and powdered creamer), Black Coffee Only (No creamer, milk or cream, including half & half and powdered creamer), and Clear Sports drink (No red color; diabetics please choose diet or no sugar options)    Take these medicines the morning of surgery with A SIP OF WATER                               esomeprazole , lialda , flomax .    Do not wear jewelry, make-up or nail polish, including gel polish,  artificial nails, or any other type of covering on natural nails (fingers and  toes).  Do not wear lotions, powders, or perfumes, or deodorant.  Do not shave 48 hours prior to surgery.  Men may shave face and neck.  Do not bring valuables to the hospital.  [facility] is not responsible for any belongings or valuables.  Contacts, dentures or bridgework may not be worn into surgery.  Leave your suitcase in the car.  After surgery it may be brought to your room.  For patients admitted to the hospital, discharge time will be determined by your treatment team.  Patients discharged the day of surgery will not be allowed to drive  home and must have someone with them for 24 hours.    Special instructions:  DO NOT smoke tobacco or vape for 24 hours before your procedure.  Please read over the following fact sheets that you were given. Anesthesia Post-op Instructions and Care and Recovery After Surgery      Upper Endoscopy, Adult, Care After After the procedure, it is common to have a sore throat. It is also common to have: Mild stomach pain or discomfort. Bloating. Nausea. Follow these instructions at home: The instructions below may help you care for yourself at home. Your health care provider may give you more instructions. If you have questions, ask your health care provider. If you were given a sedative during the procedure, it can affect you for several hours. Do not drive or operate machinery until your health care provider says that it is safe. If you will be going home right after the procedure, plan to have a responsible adult: Take you home from the hospital or clinic. You will not be allowed to drive. Care for you for the time you are told. Follow instructions from your health care provider about what you may  eat and drink. Return to your normal activities as told by your health care provider. Ask your health care provider what activities are safe for you. Take over-the-counter and prescription medicines only as told by your health care provider. Contact a health care provider if you: Have a sore throat that lasts longer than one day. Have trouble swallowing. Have a fever. Get help right away if you: Vomit blood or your vomit looks like coffee grounds. Have bloody, black, or tarry stools. Have a very bad sore throat or you cannot swallow. Have difficulty breathing or very bad pain in your chest or abdomen. These symptoms may be an emergency. Get help right away. Call 911. Do not wait to see if the symptoms will go away. Do not drive yourself to the hospital. Summary After the procedure, it is  common to have a sore throat, mild stomach discomfort, bloating, and nausea. If you were given a sedative during the procedure, it can affect you for several hours. Do not drive until your health care provider says that it is safe. Follow instructions from your health care provider about what you may eat and drink. Return to your normal activities as told by your health care provider. This information is not intended to replace advice given to you by your health care provider. Make sure you discuss any questions you have with your health care provider. Document Revised: 04/12/2021 Document Reviewed: 04/12/2021 Elsevier Patient Education  2024 Elsevier Inc.General Anesthesia, Adult, Care After The following information offers guidance on how to care for yourself after your procedure. Your health care provider may also give you more specific instructions. If you have problems or questions, contact your health care provider. What can I expect after the procedure? After the procedure, it is common for people to: Have pain or discomfort at the IV site. Have nausea or vomiting. Have a sore throat or hoarseness. Have trouble concentrating. Feel cold or chills. Feel weak, sleepy, or tired (fatigue). Have soreness and body aches. These can affect parts of the body that were not involved in surgery. Follow these instructions at home: For the time period you were told by your health care provider:  Rest. Do not participate in activities where you could fall or become injured. Do not drive or use machinery. Do not drink alcohol . Do not take sleeping pills or medicines that cause drowsiness. Do not make important decisions or sign legal documents. Do not take care of children on your own. General instructions Drink enough fluid to keep your urine pale yellow. If you have sleep apnea, surgery and certain medicines can increase your risk for breathing problems. Follow instructions from your health  care provider about wearing your sleep device: Anytime you are sleeping, including during daytime naps. While taking prescription pain medicines, sleeping medicines, or medicines that make you drowsy. Return to your normal activities as told by your health care provider. Ask your health care provider what activities are safe for you. Take over-the-counter and prescription medicines only as told by your health care provider. Do not use any products that contain nicotine or tobacco. These products include cigarettes, chewing tobacco, and vaping devices, such as e-cigarettes. These can delay incision healing after surgery. If you need help quitting, ask your health care provider. Contact a health care provider if: You have nausea or vomiting that does not get better with medicine. You vomit every time you eat or drink. You have pain that does not get better with medicine. You cannot urinate  or have bloody urine. You develop a skin rash. You have a fever. Get help right away if: You have trouble breathing. You have chest pain. You vomit blood. These symptoms may be an emergency. Get help right away. Call 911. Do not wait to see if the symptoms will go away. Do not drive yourself to the hospital. Summary After the procedure, it is common to have a sore throat, hoarseness, nausea, vomiting, or to feel weak, sleepy, or fatigue. For the time period you were told by your health care provider, do not drive or use machinery. Get help right away if you have difficulty breathing, have chest pain, or vomit blood. These symptoms may be an emergency. This information is not intended to replace advice given to you by your health care provider. Make sure you discuss any questions you have with your health care provider. Document Revised: 03/31/2021 Document Reviewed: 03/31/2021 Elsevier Patient Education  2024 [employer].

## 2023-07-09 ENCOUNTER — Encounter (HOSPITAL_COMMUNITY): Payer: Self-pay | Admitting: Gastroenterology

## 2023-07-09 ENCOUNTER — Encounter (HOSPITAL_COMMUNITY)
Admission: RE | Disposition: A | Discharge status: Home / Self Care | Payer: Self-pay | Source: Home / Self Care | Attending: Gastroenterology

## 2023-07-09 ENCOUNTER — Ambulatory Visit (HOSPITAL_COMMUNITY)
Admission: RE | Admit: 2023-07-09 | Discharge: 2023-07-09 | Disposition: A | Discharge status: Home / Self Care | Attending: Gastroenterology | Admitting: Gastroenterology

## 2023-07-09 ENCOUNTER — Ambulatory Visit (HOSPITAL_COMMUNITY): Payer: Self-pay | Admitting: Anesthesiology

## 2023-07-09 ENCOUNTER — Other Ambulatory Visit: Payer: Self-pay

## 2023-07-09 DIAGNOSIS — K269 Duodenal ulcer, unspecified as acute or chronic, without hemorrhage or perforation: Secondary | ICD-10-CM

## 2023-07-09 DIAGNOSIS — K2289 Other specified disease of esophagus: Secondary | ICD-10-CM | POA: Diagnosis not present

## 2023-07-09 DIAGNOSIS — Z7901 Long term (current) use of anticoagulants: Secondary | ICD-10-CM | POA: Diagnosis not present

## 2023-07-09 DIAGNOSIS — K311 Adult hypertrophic pyloric stenosis: Secondary | ICD-10-CM

## 2023-07-09 DIAGNOSIS — K219 Gastro-esophageal reflux disease without esophagitis: Secondary | ICD-10-CM | POA: Insufficient documentation

## 2023-07-09 DIAGNOSIS — K257 Chronic gastric ulcer without hemorrhage or perforation: Secondary | ICD-10-CM | POA: Insufficient documentation

## 2023-07-09 DIAGNOSIS — K5190 Ulcerative colitis, unspecified, without complications: Secondary | ICD-10-CM | POA: Diagnosis not present

## 2023-07-09 DIAGNOSIS — Z86718 Personal history of other venous thrombosis and embolism: Secondary | ICD-10-CM | POA: Diagnosis not present

## 2023-07-09 DIAGNOSIS — T182XXA Foreign body in stomach, initial encounter: Secondary | ICD-10-CM

## 2023-07-09 DIAGNOSIS — K3189 Other diseases of stomach and duodenum: Secondary | ICD-10-CM

## 2023-07-09 DIAGNOSIS — Z09 Encounter for follow-up examination after completed treatment for conditions other than malignant neoplasm: Secondary | ICD-10-CM | POA: Diagnosis not present

## 2023-07-09 DIAGNOSIS — K259 Gastric ulcer, unspecified as acute or chronic, without hemorrhage or perforation: Secondary | ICD-10-CM | POA: Diagnosis not present

## 2023-07-09 HISTORY — PX: ESOPHAGOGASTRODUODENOSCOPY: SHX5428

## 2023-07-09 SURGERY — EGD (ESOPHAGOGASTRODUODENOSCOPY)
Anesthesia: General

## 2023-07-09 MED ORDER — LIDOCAINE 2% (20 MG/ML) 5 ML SYRINGE
INTRAMUSCULAR | Status: DC | PRN
  Administered 2023-07-09: 100 mg via INTRAVENOUS

## 2023-07-09 MED ORDER — PROPOFOL 10 MG/ML IV BOLUS
INTRAVENOUS | Status: DC | PRN
Start: 2023-07-09 — End: 2023-07-09
  Administered 2023-07-09 (×2): 20 mg via INTRAVENOUS
  Administered 2023-07-09 (×2): 50 mg via INTRAVENOUS

## 2023-07-09 MED ORDER — LACTATED RINGERS IV SOLN: INTRAVENOUS | Status: DC

## 2023-07-09 NOTE — Op Note (Signed)
 [facility] Patient Name: David Mooney Procedure Date: 07/09/2023 7:10 AM MRN: [id number] Date of Birth: January 07, 1941 Attending MD: Toribio Fortune , , [id number] CSN: [id number] Age: 83 Admit Type: Outpatient Procedure:                Upper GI endoscopy Indications:              Follow-up of chronic gastric ulcer Providers:                Toribio Fortune, Olam Ada, RN, Jon Loge Referring MD:              Medicines:                Monitored Anesthesia Care Complications:            No immediate complications. Estimated Blood Loss:     Estimated blood loss: none. Procedure:                Pre-Anesthesia Assessment:                           - Prior to the procedure, a History and Physical                            was performed, and patient medications, allergies                            and sensitivities were reviewed. The patient's                            tolerance of previous anesthesia was reviewed.                           - The risks and benefits of the procedure and the                            sedation options and risks were discussed with the                            patient. All questions were answered and informed                            consent was obtained.                           - ASA Grade Assessment: III - A patient with severe                            systemic disease.                           After obtaining informed consent, the endoscope was                            passed under direct vision. Throughout the                            procedure, the  patient's blood pressure, pulse, and                            oxygen saturations were monitored continuously. The                            [id number]-H190 ([id number]) scope was introduced through the                            mouth, and advanced to the second part of duodenum.                            The upper GI endoscopy was accomplished without                             difficulty. The patient tolerated the procedure                            well. Scope In: 7:35:41 AM Scope Out: 7:40:25 AM Total Procedure Duration: 0 hours 4 minutes 44 seconds  Findings:      Localized mild mucosal changes characterized by discoloration were found       in the distal esophagus. Biopsies were taken with a cold forceps for       histology.      Seven pills were found in the gastric body.      One non-bleeding superficial gastric ulcer with a clean ulcer base       (Forrest Class III) was found at the pylorus. The lesion was 8 mm in       largest dimension. The ulcer had surrounding edematous tissue and was       partially obstructing, but could be traversed. Slightly better compared       to prior. Biopsies were taken with a cold forceps for Helicobacter       pylori testing.      The examined duodenum was normal. Impression:               - Discolored mucosa in the esophagus. Biopsied.                           - Pills were found in the stomach.                           - Non-bleeding gastric ulcer with a clean ulcer                            base (Forrest Class III). Biopsied. Partial pyloric                            stenosis.                           - Normal examined duodenum. Moderate Sedation:      Per Anesthesia Care Recommendation:           - Discharge patient to home (ambulatory).                           -  Resume previous diet.                           - Await pathology results.                           - Use Nexium  (esomeprazole ) 40 mg PO BID.                           - Repeat upper endoscopy in 4 months for treatment                            of stenosis. Will need to stay on liquid diet the                            day before.                           - Restart Eliquis tonight. Procedure Code(s):        --- Professional ---                           [phone], Esophagogastroduodenoscopy, flexible,                            transoral; with  biopsy, single or multiple Diagnosis Code(s):        --- Professional ---                           K22.89, Other specified disease of esophagus                           T18.2XXA, Foreign body in stomach, initial encounter                           K25.9, Gastric ulcer, unspecified as acute or                            chronic, without hemorrhage or perforation                           K25.7, Chronic gastric ulcer without hemorrhage or                            perforation CPT copyright 2022 American Medical Association. All rights reserved. The codes documented in this report are preliminary and upon coder review may  be revised to meet current compliance requirements. Toribio Fortune, MD Toribio Fortune,  07/09/2023 7:52:48 AM This report has been signed electronically. Number of Addenda: 0

## 2023-07-09 NOTE — H&P (Signed)
 David Mooney is an 83 y.o. male.   Chief Complaint:  severe esophagitis, duodenal ulcer . HPI: David Mooney is a 83 y.o. 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  The patient denies having any nausea, vomiting, fever, chills, hematochezia, melena, hematemesis, abdominal distention, abdominal pain, diarrhea, jaundice, pruritus or weight loss.  Past Medical History:  Diagnosis Date   Arthritis    Cataract    Complication of anesthesia    Embolus (HCC) 12/2022   left Hip   GERD (gastroesophageal reflux disease)    Glaucoma    Hernia of unspecified site of abdominal cavity without mention of obstruction or gangrene    History of gastric ulcer    History of kidney stones    PONV (postoperative nausea and vomiting)    Ulcerative colitis     Past Surgical History:  Procedure Laterality Date   BIOPSY  12/04/2018   Procedure: BIOPSY;  Surgeon: Golda Claudis PENNER, MD;  Location: [facility];  Service: Endoscopy;;  cecum   COLONOSCOPY     COLONOSCOPY N/A 08/27/2012   Procedure: COLONOSCOPY;  Surgeon: Claudis PENNER Golda, MD;  Location: [facility];  Service: Endoscopy;  Laterality: N/A;  1200-moved to 13:15 Ann to notify pt   COLONOSCOPY N/A 12/04/2018   Procedure: COLONOSCOPY;  Surgeon: Golda Claudis PENNER, MD;  Location: [facility];  Service: Endoscopy;  Laterality: N/A;  155pm   CYSTOSCOPY WITH INSERTION OF UROLIFT N/A 04/30/2022   Procedure: CYSTOSCOPY WITH INSERTION OF UROLIFT;  Surgeon: Sherrilee Belvie CROME, MD;  Location: [facility];  Service: Urology;  Laterality: N/A;   ESOPHAGOGASTRODUODENOSCOPY (EGD) WITH PROPOFOL   01/04/2023   Procedure: ESOPHAGOGASTRODUODENOSCOPY (EGD) WITH PROPOFOL ;  Surgeon: Cindie Carlin POUR, DO;  [facility]: [facility];  Service: Endoscopy;;   ESOPHAGOGASTRODUODENOSCOPY (EGD) WITH PROPOFOL  N/A 04/09/2023   Procedure:  ESOPHAGOGASTRODUODENOSCOPY (EGD) WITH PROPOFOL ;  Surgeon: Eartha Angelia Sieving, MD;  Location: [facility];  Service: Gastroenterology;  Laterality: N/A;  7:30am;asa 3   HERNIA REPAIR Left    MOLE REMOVAL     From head , chest   UPPER GASTROINTESTINAL ENDOSCOPY      Family History  Problem Relation Age of Onset   Arrhythmia Mother    Anuerysm Father    Hypertension Father    Diabetes Father    Healthy Sister    Healthy Sister    Irritable bowel syndrome Daughter    Thyroid  disease Daughter    Social History:  reports that he has never smoked. He has never used smokeless tobacco. He reports that he does not drink alcohol  and does not use drugs.  Allergies: No Known Allergies  Medications Prior to Admission  Medication Sig Dispense Refill   atorvastatin (LIPITOR) 20 MG tablet Take 20 mg by mouth daily.     finasteride  (PROSCAR ) 5 MG tablet Take 1 tablet (5 mg total) by mouth daily. TAKE 1 TABLET(5 MG) BY MOUTH DAILY 90 tablet 3   mirabegron  ER (MYRBETRIQ ) 25 MG TB24 tablet Take 1 tablet (25 mg total) by mouth daily. 30 tablet 11   sucralfate  (CARAFATE ) 1 GM/10ML suspension Take 10 mLs (1 g total) by mouth 4 (four) times daily -  with meals and at bedtime. 2400 mL 1   apixaban (ELIQUIS) 5 MG TABS tablet Take 5 mg by mouth 2 (two) times daily.     esomeprazole  (NEXIUM ) 40 MG capsule Take  1 capsule (40 mg total) by mouth 2 (two) times daily before a meal. 60 capsule 5   fluticasone (FLONASE) 50 MCG/ACT nasal spray Place 2 sprays into both nostrils daily as needed for allergies. (Patient not taking: Reported on 05/20/2023)     mesalamine  (LIALDA ) 1.2 g EC tablet TAKE 2 TABLETS(2.4 GRAMS) BY MOUTH TWICE DAILY 360 tablet 3   Multiple Vitamin (MULTIVITAMIN) tablet Take 1 tablet by mouth daily.     tamsulosin  (FLOMAX ) 0.4 MG CAPS capsule Take 0.4 mg by mouth daily.     VYZULTA 0.024 % SOLN Place 1 drop into both eyes every evening.      No results found for this or any previous visit  (from the past 48 hours). No results found.  Review of Systems  All other systems reviewed and are negative.   Blood pressure (!) 159/76, pulse (!) 56, temperature 97.9 F (36.6 C), temperature source Oral, resp. rate 12, height 5' 6 (1.676 m), weight 77.4 kg, SpO2 98%. Physical Exam  GENERAL: The patient is AO x3, in no acute distress. HEENT: Head is normocephalic and atraumatic. EOMI are intact. Mouth is well hydrated and without lesions. NECK: Supple. No masses LUNGS: Clear to auscultation. No presence of rhonchi/wheezing/rales. Adequate chest expansion HEART: RRR, normal s1 and s2. ABDOMEN: Soft, nontender, no guarding, no peritoneal signs, and nondistended. BS +. No masses. EXTREMITIES: Without any cyanosis, clubbing, rash, lesions or edema. NEUROLOGIC: AOx3, no focal motor deficit. SKIN: no jaundice, no rashes  Assessment/Plan  David Mooney is a 83 y.o. 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 We will proceed with EGD.  Toribio Eartha Flavors, MD 07/09/2023, 7:22 AM

## 2023-07-09 NOTE — Discharge Instructions (Signed)
 You are being discharged to home.  Resume your previous diet.  We are waiting for your pathology results.  Take Nexium  (esomeprazole ) 40 mg by mouth twice a day.  Your physician has recommended a repeat upper endoscopy in four months for treatment. Will need to stay on liquid diet the day before. Restart Eliquis tonight.

## 2023-07-09 NOTE — Transfer of Care (Signed)
 Immediate Anesthesia Transfer of Care Note  Patient: David Mooney  Procedure(s) Performed: EGD (ESOPHAGOGASTRODUODENOSCOPY)  Patient Location: Endoscopy Unit  Anesthesia Type:General  Level of Consciousness: awake, alert , oriented, and patient cooperative  Airway & Oxygen Therapy: Patient Spontanous Breathing  Post-op Assessment: Report given to RN, Post -op Vital signs reviewed and stable, and Patient moving all extremities X 4  Post vital signs: Reviewed and stable  Last Vitals:  Vitals Value Taken Time  BP 95/56 07/09/23 07:48  Temp 36.4 C 07/09/23 07:45  Pulse 60 07/09/23 07:45  Resp 16 07/09/23 07:45  SpO2 99 % 07/09/23 07:48    Last Pain:  Vitals:   07/09/23 0745  TempSrc: Oral  PainSc: 0-No pain         Complications: No notable events documented.

## 2023-07-09 NOTE — Anesthesia Preprocedure Evaluation (Signed)
 Anesthesia Evaluation  Patient identified by MRN, date of birth, ID band Patient awake    Reviewed: Allergy & Precautions, H&P , NPO status , Patient's Chart, lab work & pertinent test results, reviewed documented beta blocker date and time   History of Anesthesia Complications (+) PONV and history of anesthetic complications  Airway Mallampati: II  TM Distance: >3 FB Neck ROM: full    Dental no notable dental hx.    Pulmonary neg pulmonary ROS   Pulmonary exam normal breath sounds clear to auscultation       Cardiovascular Exercise Tolerance: Good hypertension, negative cardio ROS  Rhythm:regular Rate:Normal     Neuro/Psych negative neurological ROS  negative psych ROS   GI/Hepatic negative GI ROS, Neg liver ROS, PUD,GERD  ,,  Endo/Other  negative endocrine ROS    Renal/GU Renal diseasenegative Renal ROS  negative genitourinary   Musculoskeletal   Abdominal   Peds  Hematology negative hematology ROS (+) Blood dyscrasia, anemia   Anesthesia Other Findings   Reproductive/Obstetrics negative OB ROS                             Anesthesia Physical Anesthesia Plan  ASA: 2  Anesthesia Plan: General   Post-op Pain Management:    Induction:   PONV Risk Score and Plan: Propofol infusion  Airway Management Planned:   Additional Equipment:   Intra-op Plan:   Post-operative Plan:   Informed Consent: I have reviewed the patients History and Physical, chart, labs and discussed the procedure including the risks, benefits and alternatives for the proposed anesthesia with the patient or authorized representative who has indicated his/her understanding and acceptance.     Dental Advisory Given  Plan Discussed with: CRNA  Anesthesia Plan Comments:        Anesthesia Quick Evaluation

## 2023-07-10 ENCOUNTER — Encounter (HOSPITAL_COMMUNITY): Payer: Self-pay | Admitting: Gastroenterology

## 2023-07-10 ENCOUNTER — Ambulatory Visit (INDEPENDENT_AMBULATORY_CARE_PROVIDER_SITE_OTHER): Payer: Self-pay | Admitting: Gastroenterology

## 2023-07-10 LAB — SURGICAL PATHOLOGY

## 2023-07-10 NOTE — Anesthesia Postprocedure Evaluation (Signed)
 Anesthesia Post Note  Patient: David Mooney  Procedure(s) Performed: EGD (ESOPHAGOGASTRODUODENOSCOPY)  Patient location during evaluation: Phase II Anesthesia Type: General Level of consciousness: awake Pain management: pain level controlled Vital Signs Assessment: post-procedure vital signs reviewed and stable Respiratory status: spontaneous breathing and respiratory function stable Cardiovascular status: blood pressure returned to baseline and stable Postop Assessment: no headache and no apparent nausea or vomiting Anesthetic complications: no Comments: Late entry   No notable events documented.   Last Vitals:  Vitals:   07/09/23 0748 07/09/23 0751  BP: (!) 95/56 (!) 103/45  Pulse:  64  Resp:    Temp:    SpO2: 99% 98%    Last Pain:  Vitals:   07/09/23 0751  TempSrc:   PainSc: 0-No pain                 [name]

## 2023-07-14 ENCOUNTER — Other Ambulatory Visit: Payer: Self-pay | Admitting: Urology

## 2023-07-14 DIAGNOSIS — R3915 Urgency of urination: Secondary | ICD-10-CM

## 2023-07-16 NOTE — Progress Notes (Signed)
 4 mth EGD noted in recall Patient result letter mailed procedure note and pathology result faxed to PCP

## 2023-07-22 ENCOUNTER — Ambulatory Visit (HOSPITAL_COMMUNITY)
Admission: RE | Admit: 2023-07-22 | Discharge: 2023-07-22 | Disposition: A | Discharge status: Home / Self Care | Source: Ambulatory Visit | Attending: Urology | Admitting: Urology

## 2023-07-22 DIAGNOSIS — I878 Other specified disorders of veins: Secondary | ICD-10-CM | POA: Diagnosis not present

## 2023-07-22 DIAGNOSIS — Z87442 Personal history of urinary calculi: Secondary | ICD-10-CM | POA: Diagnosis not present

## 2023-07-22 DIAGNOSIS — N200 Calculus of kidney: Secondary | ICD-10-CM | POA: Insufficient documentation

## 2023-07-22 NOTE — Progress Notes (Unsigned)
 Name: David Mooney DOB: Aug 24, 1940 MRN: [id number]  History of Present Illness: David Mooney is a 83 y.o. male who presents today for follow up visit at [facility] [place]. Relevant History includes: 1. BPH with BOO & LUTS (urgency, nocturia).  - 04/30/2022: Underwent Urolift procedure by Dr. Sherrilee. - Previously took Rapaflo  and Uroxatral . - Taking Proscar , Flomax , Myrbetriq  25 mg. 2. Kidney stones.  At last visit on 01/23/2023: KUB negative for radiopaque urinary tract calculi identified by x-ray.  Today: KUB yesterday (07/22/2023): No visualized urolithiasis. Calcification in the pelvis are unchanged in typical of phleboliths.  He reports that he believes he passed a stone on the left side 1-2 months ago; had some LLQ pain which lasted for 1-2 days then suddenly resolved. He denies flank pain or abdominal pain currently.  He states he feels that he is emptying his bladder better than he has in a long time (states his PVR today doesn't reflect this because he was rushing). He denies urinary urgency, frequency, dysuria, gross hematuria, hesitancy, straining to void, or sensations of incomplete emptying. Reports nocturia 2-3x/night.   He reports history of obstructive sleep apnea and denies wearing CPAP routinely.  He reports trying to minimize his fluid intake within 3 hours prior to bedtime and during the night.  He reports some caffeine intake within 8 hours prior to bedtime.   Medications: Current Outpatient Medications  Medication Sig Dispense Refill   finasteride  (PROSCAR ) 5 MG tablet Take 1 tablet (5 mg total) by mouth daily. TAKE 1 TABLET(5 MG) BY MOUTH DAILY 90 tablet 3   mirabegron  ER (MYRBETRIQ ) 50 MG TB24 tablet Take 1 tablet (50 mg total) by mouth daily. 30 tablet 5   apixaban (ELIQUIS) 5 MG TABS tablet Take 5 mg by mouth 2 (two) times daily. (Patient not taking: Reported on 07/23/2023)     atorvastatin (LIPITOR) 20 MG tablet Take 20 mg by mouth daily.      esomeprazole  (NEXIUM ) 40 MG capsule Take 1 capsule (40 mg total) by mouth 2 (two) times daily before a meal. 60 capsule 5   fluticasone (FLONASE) 50 MCG/ACT nasal spray Place 2 sprays into both nostrils daily as needed for allergies. (Patient not taking: Reported on 05/20/2023)     mesalamine  (LIALDA ) 1.2 g EC tablet TAKE 2 TABLETS(2.4 GRAMS) BY MOUTH TWICE DAILY 360 tablet 3   Multiple Vitamin (MULTIVITAMIN) tablet Take 1 tablet by mouth daily.     sucralfate  (CARAFATE ) 1 GM/10ML suspension Take 10 mLs (1 g total) by mouth 4 (four) times daily -  with meals and at bedtime. 2400 mL 1   tamsulosin  (FLOMAX ) 0.4 MG CAPS capsule Take 1 capsule (0.4 mg total) by mouth daily. 30 capsule 11   VYZULTA 0.024 % SOLN Place 1 drop into both eyes every evening.     No current facility-administered medications for this visit.    Allergies: No Known Allergies  Past Medical History:  Diagnosis Date   Arthritis    Cataract    Complication of anesthesia    Embolus (HCC) 12/2022   left Hip   GERD (gastroesophageal reflux disease)    Glaucoma    Hernia of unspecified site of abdominal cavity without mention of obstruction or gangrene    History of gastric ulcer    History of kidney stones    PONV (postoperative nausea and vomiting)    Ulcerative colitis    Past Surgical History:  Procedure Laterality Date   BIOPSY  12/04/2018  Procedure: BIOPSY;  Surgeon: Golda Claudis PENNER, MD;  [facility]: [facility];  Service: Endoscopy;;  cecum   COLONOSCOPY     COLONOSCOPY N/A 08/27/2012   Procedure: COLONOSCOPY;  Surgeon: Claudis PENNER Golda, MD;  [facility]: [facility];  Service: Endoscopy;  Laterality: N/A;  1200-moved to 13:15 Ann to notify pt   COLONOSCOPY N/A 12/04/2018   Procedure: COLONOSCOPY;  Surgeon: Golda Claudis PENNER, MD;  [facility];  Service: Endoscopy;  Laterality: N/A;  155pm   CYSTOSCOPY WITH INSERTION OF UROLIFT N/A 04/30/2022   Procedure: CYSTOSCOPY WITH INSERTION OF UROLIFT;   Surgeon: Sherrilee Belvie CROME, MD;  Location: [facility];  Service: Urology;  Laterality: N/A;   ESOPHAGOGASTRODUODENOSCOPY N/A 07/09/2023   Procedure: EGD (ESOPHAGOGASTRODUODENOSCOPY);  Surgeon: Eartha Flavors, Toribio, MD;  [facility]: [facility];  Service: Gastroenterology;  Laterality: N/A;  7:30AM;ASA 3   ESOPHAGOGASTRODUODENOSCOPY (EGD) WITH PROPOFOL   01/04/2023   Procedure: ESOPHAGOGASTRODUODENOSCOPY (EGD) WITH PROPOFOL ;  Surgeon: Cindie Carlin POUR, DO;  [facility];  Service: Endoscopy;;   ESOPHAGOGASTRODUODENOSCOPY (EGD) WITH PROPOFOL  N/A 04/09/2023   Procedure: ESOPHAGOGASTRODUODENOSCOPY (EGD) WITH PROPOFOL ;  Surgeon: Eartha Flavors Toribio, MD;  [facility]: [facility];  Service: Gastroenterology;  Laterality: N/A;  7:30am;asa 3   HERNIA REPAIR Left    MOLE REMOVAL     From head , chest   UPPER GASTROINTESTINAL ENDOSCOPY     Family History  Problem Relation Age of Onset   Arrhythmia Mother    Anuerysm Father    Hypertension Father    Diabetes Father    Healthy Sister    Healthy Sister    Irritable bowel syndrome Daughter    Thyroid  disease Daughter    Social History   Socioeconomic History   Marital status: Married    Spouse name: Not on file   Number of children: Not on file   Years of education: Not on file   Highest education level: Not on file  Occupational History   Not on file  Tobacco Use   Smoking status: Never   Smokeless tobacco: Never  Vaping Use   Vaping status: Never Used  Substance and Sexual Activity   Alcohol  use: No   Drug use: No   Sexual activity: Yes  Other Topics Concern   Not on file  Social History Narrative   Not on file   Social Drivers of Health   Financial Resource Strain: Not on file  Food Insecurity: Low Risk  (01/05/2023)   Received from [facility]   Hunger Vital Sign    Within the past 12 months, you worried that your food would run out before you got money to buy more: Never true    Within the past 12  months, the food you bought just didn't last and you didn't have money to get more. : Never true  Transportation Needs: No Transportation Needs (01/05/2023)   Received from [facility]    In the past 12 months, has lack of reliable transportation kept you from medical appointments, meetings, work or from getting things needed for daily living? : No  Physical Activity: Not on file  Stress: Not on file  Social Connections: Not on file  Intimate Partner Violence: Not on file    SUBJECTIVE  Review of Systems Constitutional: Patient denies any unintentional weight loss or change in strength lntegumentary: Patient denies any rashes or pruritus Cardiovascular: Patient denies chest pain or syncope Respiratory: Patient denies shortness of breath Gastrointestinal: Patient denies  nausea, vomiting, constipation, or diarrhea  Musculoskeletal: Patient denies muscle cramps or weakness Neurologic: Patient denies convulsions or seizures Allergic/Immunologic: Patient denies recent allergic reaction(s) Hematologic/Lymphatic: Patient denies bleeding tendencies Endocrine: Patient denies heat/cold intolerance  GU: As per HPI.  OBJECTIVE Vitals:   07/23/23 0922  BP: (!) 113/59  Pulse: 71  Temp: 98.3 F (36.8 C)   There is no height or weight on file to calculate BMI.  Physical Examination Constitutional: No obvious distress; patient is non-toxic appearing  Cardiovascular: No visible lower extremity edema.  Respiratory: The patient does not have audible wheezing/stridor; respirations do not appear labored  Gastrointestinal: Abdomen non-distended Musculoskeletal: Normal ROM of UEs  Skin: No obvious rashes/open sores  Neurologic: CN 2-12 grossly intact Psychiatric: Answered questions appropriately with normal affect  Hematologic/Lymphatic/Immunologic: No obvious bruises or sites of spontaneous bleeding  UA: negative  PVR: 172 ml (patient states this is not reflective of his  norm because he was rushing today)  ASSESSMENT Benign prostatic hyperplasia with urinary obstruction - Plan: BLADDER SCAN AMB NON-IMAGING, tamsulosin  (FLOMAX ) 0.4 MG CAPS capsule  Kidney stones - Plan: Urinalysis, Routine w reflex microscopic  Urgency of urination - Plan: BLADDER SCAN AMB NON-IMAGING, mirabegron  ER (MYRBETRIQ ) 50 MG TB24 tablet  Nocturia - Plan: mirabegron  ER (MYRBETRIQ ) 50 MG TB24 tablet  We reviewed recent imaging results; no acute stone findings. Will plan to repeat imaging for stone surveillance in 6-12 months.  For nocturia we agreed to increase Myrbetriq  to 50 mg daily. He was also advised to continue minimizing fluid intake within 3 hours prior to bedtime / overnight, avoid caffeine intake within 8 hours prior to bedtime, and to follow up with his PCP and/or sleep specialist about his OSA / possible need for CPAP.  Patient verbalized understanding of and agreement with current plan. All questions were answered.  PLAN Advised the following: Continue Flomax  (Tamsulosin ) 0.4 mg daily. Continue Proscar  (Finasteride ) 5 mg daily. Increase Myrbetriq  to 50 mg daily.  Continue minimizing fluid intake within 3 hours prior to bedtime / overnight. Avoid caffeine intake within 8 hours prior to bedtime Follow up with PCP and/or sleep specialist about OSA management. Return in about 4 weeks (around 08/20/2023) for BPH, OAB, with UA & PVR.  Orders Placed This Encounter  Procedures   Urinalysis, Routine w reflex microscopic   BLADDER SCAN AMB NON-IMAGING    It has been explained that the patient is to follow regularly with their PCP in addition to all other providers involved in their care and to follow instructions provided by these respective offices. Patient advised to contact urology clinic if any urologic-pertaining questions, concerns, new symptoms or problems arise in the interim period.  There are no Patient Instructions on file for this visit.  Electronically signed  by:  Lauraine KYM Oz, MSN, FNP-C, CUNP 07/23/2023 10:00 AM

## 2023-07-23 ENCOUNTER — Encounter: Payer: Self-pay | Admitting: Urology

## 2023-07-23 ENCOUNTER — Ambulatory Visit: Payer: Medicare Other | Admitting: Urology

## 2023-07-23 VITALS — BP 113/59 | HR 71 | Temp 98.3°F

## 2023-07-23 DIAGNOSIS — R3915 Urgency of urination: Secondary | ICD-10-CM | POA: Diagnosis not present

## 2023-07-23 DIAGNOSIS — N401 Enlarged prostate with lower urinary tract symptoms: Secondary | ICD-10-CM

## 2023-07-23 DIAGNOSIS — N138 Other obstructive and reflux uropathy: Secondary | ICD-10-CM

## 2023-07-23 DIAGNOSIS — N200 Calculus of kidney: Secondary | ICD-10-CM | POA: Diagnosis not present

## 2023-07-23 DIAGNOSIS — R351 Nocturia: Secondary | ICD-10-CM

## 2023-07-23 LAB — URINALYSIS, ROUTINE W REFLEX MICROSCOPIC
Bilirubin, UA: NEGATIVE
Glucose, UA: NEGATIVE
Ketones, UA: NEGATIVE
Leukocytes,UA: NEGATIVE
Nitrite, UA: NEGATIVE
Protein,UA: NEGATIVE
RBC, UA: NEGATIVE
Specific Gravity, UA: 1.02 (ref 1.005–1.030)
Urobilinogen, Ur: 0.2 mg/dL (ref 0.2–1.0)
pH, UA: 6 (ref 5.0–7.5)

## 2023-07-23 LAB — BLADDER SCAN AMB NON-IMAGING: Scan Result: 172

## 2023-07-23 MED ORDER — TAMSULOSIN HCL 0.4 MG PO CAPS: 0.4000 mg | ORAL_CAPSULE | Freq: Every day | ORAL | 11 refills | Status: DC

## 2023-07-23 MED ORDER — MIRABEGRON ER 50 MG PO TB24: 50.0000 mg | ORAL_TABLET | Freq: Every day | ORAL | 5 refills | Status: DC

## 2023-07-23 NOTE — Progress Notes (Signed)
 Bladder Scan completed today.  Patient can void prior to the bladder scan. Bladder scan result: 172  Performed By: [facility] LPN

## 2023-08-05 DIAGNOSIS — L989 Disorder of the skin and subcutaneous tissue, unspecified: Secondary | ICD-10-CM | POA: Diagnosis not present

## 2023-08-05 DIAGNOSIS — H6122 Impacted cerumen, left ear: Secondary | ICD-10-CM | POA: Diagnosis not present

## 2023-08-05 DIAGNOSIS — Z6827 Body mass index (BMI) 27.0-27.9, adult: Secondary | ICD-10-CM | POA: Diagnosis not present

## 2023-08-05 DIAGNOSIS — L237 Allergic contact dermatitis due to plants, except food: Secondary | ICD-10-CM | POA: Diagnosis not present

## 2023-08-09 DIAGNOSIS — Z6827 Body mass index (BMI) 27.0-27.9, adult: Secondary | ICD-10-CM | POA: Diagnosis not present

## 2023-08-09 DIAGNOSIS — A46 Erysipelas: Secondary | ICD-10-CM | POA: Diagnosis not present

## 2023-08-12 DIAGNOSIS — Z6827 Body mass index (BMI) 27.0-27.9, adult: Secondary | ICD-10-CM | POA: Diagnosis not present

## 2023-08-12 DIAGNOSIS — A46 Erysipelas: Secondary | ICD-10-CM | POA: Diagnosis not present

## 2023-08-13 ENCOUNTER — Inpatient Hospital Stay (HOSPITAL_COMMUNITY)

## 2023-08-13 ENCOUNTER — Inpatient Hospital Stay (HOSPITAL_COMMUNITY)
Admission: EM | Admit: 2023-08-13 | Discharge: 2023-08-17 | DRG: 386 | Disposition: A | Discharge status: Home / Self Care | Attending: Internal Medicine | Admitting: Internal Medicine

## 2023-08-13 ENCOUNTER — Emergency Department (HOSPITAL_COMMUNITY)

## 2023-08-13 ENCOUNTER — Other Ambulatory Visit: Payer: Self-pay

## 2023-08-13 ENCOUNTER — Encounter (HOSPITAL_COMMUNITY): Payer: Self-pay

## 2023-08-13 DIAGNOSIS — N401 Enlarged prostate with lower urinary tract symptoms: Secondary | ICD-10-CM | POA: Diagnosis not present

## 2023-08-13 DIAGNOSIS — R109 Unspecified abdominal pain: Principal | ICD-10-CM

## 2023-08-13 DIAGNOSIS — R710 Precipitous drop in hematocrit: Secondary | ICD-10-CM | POA: Diagnosis not present

## 2023-08-13 DIAGNOSIS — Z833 Family history of diabetes mellitus: Secondary | ICD-10-CM

## 2023-08-13 DIAGNOSIS — Z87442 Personal history of urinary calculi: Secondary | ICD-10-CM

## 2023-08-13 DIAGNOSIS — K56609 Unspecified intestinal obstruction, unspecified as to partial versus complete obstruction: Secondary | ICD-10-CM | POA: Diagnosis not present

## 2023-08-13 DIAGNOSIS — Z7901 Long term (current) use of anticoagulants: Secondary | ICD-10-CM | POA: Diagnosis not present

## 2023-08-13 DIAGNOSIS — K51912 Ulcerative colitis, unspecified with intestinal obstruction: Principal | ICD-10-CM | POA: Diagnosis present

## 2023-08-13 DIAGNOSIS — N281 Cyst of kidney, acquired: Secondary | ICD-10-CM | POA: Diagnosis not present

## 2023-08-13 DIAGNOSIS — K5000 Crohn's disease of small intestine without complications: Secondary | ICD-10-CM

## 2023-08-13 DIAGNOSIS — N138 Other obstructive and reflux uropathy: Secondary | ICD-10-CM | POA: Diagnosis not present

## 2023-08-13 DIAGNOSIS — H409 Unspecified glaucoma: Secondary | ICD-10-CM | POA: Diagnosis not present

## 2023-08-13 DIAGNOSIS — K279 Peptic ulcer, site unspecified, unspecified as acute or chronic, without hemorrhage or perforation: Secondary | ICD-10-CM

## 2023-08-13 DIAGNOSIS — K5100 Ulcerative (chronic) pancolitis without complications: Secondary | ICD-10-CM | POA: Diagnosis not present

## 2023-08-13 DIAGNOSIS — R1084 Generalized abdominal pain: Secondary | ICD-10-CM | POA: Diagnosis not present

## 2023-08-13 DIAGNOSIS — K635 Polyp of colon: Secondary | ICD-10-CM | POA: Diagnosis not present

## 2023-08-13 DIAGNOSIS — Z86718 Personal history of other venous thrombosis and embolism: Secondary | ICD-10-CM

## 2023-08-13 DIAGNOSIS — K311 Adult hypertrophic pyloric stenosis: Secondary | ICD-10-CM | POA: Diagnosis not present

## 2023-08-13 DIAGNOSIS — K920 Hematemesis: Secondary | ICD-10-CM | POA: Diagnosis not present

## 2023-08-13 DIAGNOSIS — Z8719 Personal history of other diseases of the digestive system: Secondary | ICD-10-CM | POA: Diagnosis not present

## 2023-08-13 DIAGNOSIS — K5190 Ulcerative colitis, unspecified, without complications: Secondary | ICD-10-CM | POA: Diagnosis present

## 2023-08-13 DIAGNOSIS — R9431 Abnormal electrocardiogram [ECG] [EKG]: Secondary | ICD-10-CM | POA: Diagnosis not present

## 2023-08-13 DIAGNOSIS — K219 Gastro-esophageal reflux disease without esophagitis: Secondary | ICD-10-CM | POA: Diagnosis not present

## 2023-08-13 DIAGNOSIS — Z79899 Other long term (current) drug therapy: Secondary | ICD-10-CM

## 2023-08-13 DIAGNOSIS — Z8249 Family history of ischemic heart disease and other diseases of the circulatory system: Secondary | ICD-10-CM

## 2023-08-13 DIAGNOSIS — M6281 Muscle weakness (generalized): Secondary | ICD-10-CM

## 2023-08-13 DIAGNOSIS — R7982 Elevated C-reactive protein (CRP): Secondary | ICD-10-CM | POA: Diagnosis not present

## 2023-08-13 DIAGNOSIS — R2681 Unsteadiness on feet: Secondary | ICD-10-CM

## 2023-08-13 DIAGNOSIS — K259 Gastric ulcer, unspecified as acute or chronic, without hemorrhage or perforation: Secondary | ICD-10-CM | POA: Diagnosis not present

## 2023-08-13 DIAGNOSIS — D72829 Elevated white blood cell count, unspecified: Secondary | ICD-10-CM | POA: Diagnosis not present

## 2023-08-13 DIAGNOSIS — Z4682 Encounter for fitting and adjustment of non-vascular catheter: Secondary | ICD-10-CM | POA: Diagnosis not present

## 2023-08-13 DIAGNOSIS — K529 Noninfective gastroenteritis and colitis, unspecified: Secondary | ICD-10-CM | POA: Diagnosis not present

## 2023-08-13 DIAGNOSIS — Z8711 Personal history of peptic ulcer disease: Secondary | ICD-10-CM | POA: Diagnosis not present

## 2023-08-13 DIAGNOSIS — R58 Hemorrhage, not elsewhere classified: Secondary | ICD-10-CM | POA: Diagnosis not present

## 2023-08-13 LAB — COMPREHENSIVE METABOLIC PANEL WITH GFR
ALT: 18 U/L (ref 0–44)
AST: 22 U/L (ref 15–41)
Albumin: 3.7 g/dL (ref 3.5–5.0)
Alkaline Phosphatase: 55 U/L (ref 38–126)
Anion gap: 11 (ref 5–15)
BUN: 20 mg/dL (ref 8–23)
CO2: 23 mmol/L (ref 22–32)
Calcium: 9.7 mg/dL (ref 8.9–10.3)
Chloride: 103 mmol/L (ref 98–111)
Creatinine, Ser: 0.91 mg/dL (ref 0.61–1.24)
GFR, Estimated: >60 mL/min (ref >60)
Glucose, Bld: 164 mg/dL — ABNORMAL HIGH (ref 70–99)
Potassium: 3.8 mmol/L (ref 3.5–5.1)
Sodium: 137 mmol/L (ref 135–145)
Total Bilirubin: 0.7 mg/dL (ref 0.0–1.2)
Total Protein: 7 g/dL (ref 6.5–8.1)

## 2023-08-13 LAB — URINALYSIS, ROUTINE W REFLEX MICROSCOPIC
Bilirubin Urine: NEGATIVE
Glucose, UA: NEGATIVE mg/dL
Hgb urine dipstick: NEGATIVE
Ketones, ur: NEGATIVE mg/dL
Leukocytes,Ua: NEGATIVE
Nitrite: NEGATIVE
Protein, ur: NEGATIVE mg/dL
Specific Gravity, Urine: 1.035 — ABNORMAL HIGH (ref 1.005–1.030)
pH: 6 (ref 5.0–8.0)

## 2023-08-13 LAB — LACTIC ACID, PLASMA
Lactic Acid, Venous: 2.3 mmol/L (ref 0.5–1.9)
Lactic Acid, Venous: 2.1 mmol/L (ref 0.5–1.9)

## 2023-08-13 LAB — CBC
HCT: 39.5 % (ref 39.0–52.0)
Hemoglobin: 13.1 g/dL (ref 13.0–17.0)
MCH: 29.6 pg (ref 26.0–34.0)
MCHC: 33.2 g/dL (ref 30.0–36.0)
MCV: 89.4 fL (ref 80.0–100.0)
Platelets: 205 K/uL (ref 150–400)
RBC: 4.42 MIL/uL (ref 4.22–5.81)
RDW: 13.4 % (ref 11.5–15.5)
WBC: 18.2 K/uL — ABNORMAL HIGH (ref 4.0–10.5)
nRBC: 0 % (ref 0.0–0.2)

## 2023-08-13 LAB — HEMOGLOBIN AND HEMATOCRIT, BLOOD
HCT: 37.6 % — ABNORMAL LOW (ref 39.0–52.0)
Hemoglobin: 12.2 g/dL — ABNORMAL LOW (ref 13.0–17.0)

## 2023-08-13 LAB — TYPE AND SCREEN
ABO/RH(D): O POS
Antibody Screen: NEGATIVE

## 2023-08-13 LAB — PROTIME-INR
INR: 1 (ref 0.8–1.2)
Prothrombin Time: 14.1 s (ref 11.4–15.2)

## 2023-08-13 LAB — LIPASE, BLOOD: Lipase: 25 U/L (ref 11–51)

## 2023-08-13 MED ORDER — ONDANSETRON HCL 4 MG/2ML IJ SOLN
4.0000 mg | Freq: Four times a day (QID) | INTRAMUSCULAR | Status: DC | PRN
  Administered 2023-08-16: 4 mg via INTRAVENOUS
  Filled 2023-08-13: qty 2

## 2023-08-13 MED ORDER — MORPHINE SULFATE (PF) 2 MG/ML IV SOLN
2.0000 mg | INTRAVENOUS | Status: DC | PRN
  Administered 2023-08-14 – 2023-08-16 (×2): 2 mg via INTRAVENOUS
  Filled 2023-08-13 (×2): qty 1

## 2023-08-13 MED ORDER — ACETAMINOPHEN 650 MG RE SUPP: 650.0000 mg | Freq: Four times a day (QID) | RECTAL | Status: DC | PRN

## 2023-08-13 MED ORDER — PANTOPRAZOLE SODIUM 40 MG IV SOLR
40.0000 mg | Freq: Two times a day (BID) | INTRAVENOUS | Status: DC
  Administered 2023-08-14 – 2023-08-17 (×7): 40 mg via INTRAVENOUS
  Filled 2023-08-13 (×7): qty 10

## 2023-08-13 MED ORDER — PIPERACILLIN-TAZOBACTAM 3.375 G IVPB 30 MIN
3.3750 g | Freq: Once | INTRAVENOUS | Status: AC
  Administered 2023-08-13: 3.375 g via INTRAVENOUS
  Filled 2023-08-13: qty 50

## 2023-08-13 MED ORDER — PANTOPRAZOLE SODIUM 40 MG IV SOLR
40.0000 mg | Freq: Once | INTRAVENOUS | Status: AC
  Administered 2023-08-13: 40 mg via INTRAVENOUS
  Filled 2023-08-13: qty 10

## 2023-08-13 MED ORDER — LACTATED RINGERS IV BOLUS
500.0000 mL | Freq: Once | INTRAVENOUS | Status: AC
  Administered 2023-08-13: 500 mL via INTRAVENOUS

## 2023-08-13 MED ORDER — ONDANSETRON HCL 4 MG/2ML IJ SOLN
4.0000 mg | Freq: Once | INTRAMUSCULAR | Status: AC
  Administered 2023-08-13: 4 mg via INTRAVENOUS
  Filled 2023-08-13: qty 2

## 2023-08-13 MED ORDER — KCL IN DEXTROSE-NACL 20-5-0.9 MEQ/L-%-% IV SOLN: INTRAVENOUS | Status: DC

## 2023-08-13 MED ORDER — ACETAMINOPHEN 325 MG PO TABS
650.0000 mg | ORAL_TABLET | Freq: Four times a day (QID) | ORAL | Status: DC | PRN
Start: 2023-08-13 — End: 2023-08-17

## 2023-08-13 MED ORDER — MORPHINE SULFATE (PF) 4 MG/ML IV SOLN
4.0000 mg | Freq: Once | INTRAVENOUS | Status: AC
  Administered 2023-08-13: 4 mg via INTRAVENOUS
  Filled 2023-08-13: qty 1

## 2023-08-13 MED ORDER — IOHEXOL 300 MG/ML  SOLN
100.0000 mL | Freq: Once | INTRAMUSCULAR | Status: AC | PRN
  Administered 2023-08-13: 100 mL via INTRAVENOUS

## 2023-08-13 MED ORDER — PIPERACILLIN-TAZOBACTAM 3.375 G IVPB
3.3750 g | Freq: Three times a day (TID) | INTRAVENOUS | Status: DC
  Administered 2023-08-13 – 2023-08-15 (×5): 3.375 g via INTRAVENOUS
  Filled 2023-08-13 (×5): qty 50

## 2023-08-13 MED ORDER — SODIUM CHLORIDE 0.9 % IV BOLUS
500.0000 mL | Freq: Once | INTRAVENOUS | Status: AC
  Administered 2023-08-13: 500 mL via INTRAVENOUS

## 2023-08-13 MED ORDER — ONDANSETRON HCL 4 MG PO TABS
4.0000 mg | ORAL_TABLET | Freq: Four times a day (QID) | ORAL | Status: DC | PRN
Start: 2023-08-13 — End: 2023-08-17

## 2023-08-13 MED ORDER — SODIUM CHLORIDE 0.9 % IV BOLUS
1000.0000 mL | Freq: Once | INTRAVENOUS | Status: AC
  Administered 2023-08-13: 1000 mL via INTRAVENOUS

## 2023-08-13 NOTE — ED Triage Notes (Signed)
 Pt BIB ems for generalized abdominal pain with vomiting today. Pt states one episode of black loose stool. Pt states vomit color black as well. Pt has hx of ulcerative colitis.

## 2023-08-13 NOTE — Plan of Care (Signed)
   Problem: Activity: Goal: Risk for activity intolerance will decrease Outcome: Progressing   Problem: Coping: Goal: Level of anxiety will decrease Outcome: Progressing

## 2023-08-13 NOTE — ED Provider Notes (Signed)
 [place] [facility] DEPARTMENT AT [facility] Provider Note   CSN: [id number] Arrival date & time: 08/13/23  1504     Patient presents with: Abdominal Pain   David Mooney is a 83 y.o. male.  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  Recently had an endoscopy which showed a nonbleeding gastric ulcer and normal duodenum..  Complaining of acute onset of nausea and right-sided abdominal pain today with 4 episodes of vomiting dark-colored maybe black and green.  1 episode of loose stools that was black.  Rates his pain as 10 out of 10.  Feels nauseous and has a headache.  Denies any chest pain or shortness of breath.  Follows with Dr. Eartha GI.  {Add pertinent medical, surgical, social history, OB history to YEP:[id number]} The history is provided by the patient.  Abdominal Pain Pain location:  RLQ Pain quality: aching   Pain severity:  Severe Onset quality:  Sudden Duration:  1 day Timing:  Constant Progression:  Waxing and waning Relieved by:  None tried Worsened by:  Nothing Ineffective treatments:  None tried Associated symptoms: diarrhea, hematemesis, melena, nausea and vomiting   Associated symptoms: no chest pain, no constipation, no cough, no dysuria, no fever and no shortness of breath        Prior to Admission medications   Medication Sig Start Date End Date Taking? Authorizing Provider  apixaban (ELIQUIS) 5 MG TABS tablet Take 5 mg by mouth 2 (two) times daily. Patient not taking: Reported on 07/23/2023    [provider]  atorvastatin (LIPITOR) 20 MG tablet Take 20 mg by mouth daily. 01/16/22   [provider]  esomeprazole  (NEXIUM ) 40 MG capsule Take 1 capsule (40 mg total) by mouth 2 (two) times daily before a meal. 04/09/23   Eartha Flavors, Toribio, MD  finasteride  (PROSCAR ) 5 MG tablet Take 1 tablet (5 mg total) by mouth daily. TAKE 1 TABLET(5 MG) BY MOUTH DAILY 04/23/23   McKenzie, Belvie CROME, MD  fluticasone [facility]) 50 MCG/ACT nasal spray Place 2 sprays into both nostrils daily as needed for allergies. Patient not taking: Reported on 05/20/2023    [provider]  mesalamine  (LIALDA ) 1.2 g EC tablet TAKE 2 TABLETS(2.4 GRAMS) BY MOUTH TWICE DAILY 10/30/22   Eartha Flavors, Toribio, MD  mirabegron  ER (MYRBETRIQ ) 50 MG TB24 tablet Take 1 tablet (50 mg total) by mouth daily. 07/23/23   Gerldine Lauraine BROCKS, FNP  Multiple Vitamin (MULTIVITAMIN) tablet Take 1 tablet by mouth daily.    [provider]  sucralfate  (CARAFATE ) 1 GM/10ML suspension Take 10 mLs (1 g total) by mouth 4 (four) times daily -  with meals and at bedtime. 05/20/23   Eartha Flavors Toribio, MD  tamsulosin  (FLOMAX ) 0.4 MG CAPS capsule Take 1 capsule (0.4 mg total) by mouth daily. 07/23/23   Larocco, Lauraine BROCKS, FNP  VYZULTA 0.024 % SOLN Place 1 drop into both eyes every evening. 04/20/22   [provider]    Allergies: Patient has no known allergies.    Review of Systems  Constitutional:  Negative for fever.  Respiratory:  Negative for cough and shortness of breath.   Cardiovascular:  Negative for chest pain.  Gastrointestinal:  Positive for abdominal pain, diarrhea, hematemesis, melena, nausea and vomiting. Negative for constipation.  Genitourinary:  Negative for dysuria.    Updated Vital Signs Ht 5' 6 (1.676 m)   Wt 77.1 kg   BMI 27.44 kg/m  Physical Exam Vitals and nursing note reviewed.  Constitutional:      General: He is not in acute distress.    Appearance: Normal appearance. He is well-developed.  HENT:     Head: Normocephalic and atraumatic.  Eyes:     Conjunctiva/sclera: Conjunctivae normal.  Cardiovascular:     Rate and Rhythm: Normal rate and regular rhythm.     Heart sounds: No murmur heard. Pulmonary:     Effort: Pulmonary effort is normal. No respiratory distress.     Breath sounds: Normal breath sounds.  Abdominal:     General: Abdomen is protuberant.      Palpations: Abdomen is soft.     Tenderness: There is generalized abdominal tenderness.  Musculoskeletal:     Cervical back: Neck supple.  Skin:    General: Skin is warm and dry.  Neurological:     General: No focal deficit present.     Mental Status: He is alert.     GCS: GCS eye subscore is 4. GCS verbal subscore is 5. GCS motor subscore is 6.     (all labs ordered are listed, but only abnormal results are displayed) Labs Reviewed  LIPASE, BLOOD  COMPREHENSIVE METABOLIC PANEL WITH GFR  CBC  URINALYSIS, ROUTINE W REFLEX MICROSCOPIC  PROTIME-INR  LACTIC ACID, PLASMA  LACTIC ACID, PLASMA  TYPE AND SCREEN    EKG: None  Radiology: No results found.  {Document cardiac monitor, telemetry assessment procedure when appropriate:32947} Procedures   Medications Ordered in the ED  sodium chloride  0.9 % bolus 500 mL (has no administration in time range)  ondansetron  (ZOFRAN ) injection 4 mg (has no administration in time range)  morphine  (PF) 4 MG/ML injection 4 mg (has no administration in time range)  pantoprazole  (PROTONIX ) injection 40 mg (has no administration in time range)    Clinical Course as of 08/13/23 1534  Tue Aug 13, 2023  1534 Rectal exam done with bed as chaperone.  Normal tone no masses.  Sample sent to lab for guaiac. [MB]    Clinical Course User Index [MB] Towana Ozell BROCKS, MD   {Click here for ABCD2, HEART and other calculators REFRESH Note before signing:1}                              Medical Decision Making Amount and/or Complexity of Data Reviewed Labs: ordered.  Risk Prescription drug management.   This patient complains of ***; this involves an extensive number of treatment Options and is a complaint that carries with it a high risk of complications and morbidity. The differential includes ***  I ordered, reviewed and interpreted labs, which included *** I ordered medication *** and reviewed PMP when indicated. I ordered imaging studies  which included *** and I independently    visualized and interpreted imaging which showed *** Additional history obtained from *** Previous records obtained and reviewed *** I consulted *** and discussed lab and imaging findings and discussed disposition.  Cardiac monitoring reviewed, *** Social determinants considered, *** Critical Interventions: ***  After the interventions stated above, I reevaluated the patient and found *** Admission and further testing considered, ***   {Document critical care time when appropriate  Document review of labs and clinical decision tools ie CHADS2VASC2, etc  Document your independent review of radiology images and any outside records  Document your discussion with family members, caretakers and with consultants  Document social determinants of health affecting pt's care  Document  your decision making why or why not admission, treatments were needed:32947:::1}   Final diagnoses:  None    ED Discharge Orders     None

## 2023-08-13 NOTE — Assessment & Plan Note (Signed)
 Holding mesalamine  while n.p.o.

## 2023-08-13 NOTE — Assessment & Plan Note (Addendum)
 Presenting with abdominal pain, vomiting and diarrhea.  Reports black vomitus and black stools.  Stool occult negative.  Leukocytosis of 18.2.  Lactic acid of 2.3 > 2.1.  His eliquis was d/c 7/19.  History of ulcerative colitis, GI ulcers. - CTAP WC- dilated bowel loops are noted in the right lower quadrant with severe inflammatory changes concerning for possible closed loop obstruction, small bowel volvulus or internal hernia.  -N.p.o. -EDP talked to Dr. Mavis - NG tube, will see in consult -IV Zosyn  - 2 L bolus given, continue D5 N/s + 20 kcl 75cc/hr - Zofran  as needed - IV Morphine  2 mg as needed

## 2023-08-13 NOTE — H&P (Signed)
 History and Physical    David Mooney FMW:[id number] DOB: 01/16/1941 DOA: 08/13/2023  PCP: Lari Elspeth BRAVO, MD   Patient coming from: Home  I have personally briefly reviewed patient's old medical records in [facility]  Chief Complaint: Vomiting, abd pain  HPI: David Mooney is a 83 y.o. male with medical history significant for DVT, BPH, duodenal ulcer, ulcerative colitis. Patient presented to the ED with complaints of sudden onset of abdominal pain that started today, with abdominal bloating, he reports at least 4 episodes of vomiting-contents were black initially and then dark green, also reports black stools. No fevers no chills. He was previously on anticoagulation with Eliquis for DVT, but this was discontinued on the 19th of this month- his provider told him he no longer needed it.  ED Course: Temperature 98.2.  Heart rate 57-61.  Respirate rate 18.  Blood pressure systolic 165-187.  O2 sat greater than 95% on room air. WBC 18.2.  Lactic acid 2.3 > 2.1. CT abdomen and pelvis- dilated bowel loops are noted in the right lower quadrant with severe inflammatory changes concerning for possible closed loop obstruction, small bowel volvulus or internal hernia.  IV Zosyn  started, 1.5 L fluid bolus given. EDP talked with surgeon on-call Dr. Mavis- NG tube, will see in consult in a.m.  Review of Systems: As per HPI all other systems reviewed and negative.  Past Medical History:  Diagnosis Date   Arthritis    Cataract    Complication of anesthesia    Embolus (HCC) 12/2022   left Hip   GERD (gastroesophageal reflux disease)    Glaucoma    Hernia of unspecified site of abdominal cavity without mention of obstruction or gangrene    History of gastric ulcer    History of kidney stones    PONV (postoperative nausea and vomiting)    Ulcerative colitis     Past Surgical History:  Procedure Laterality Date   BIOPSY  12/04/2018   Procedure: BIOPSY;  Surgeon: Golda Claudis PENNER, MD;  [facility]: [facility];  Service: Endoscopy;;  cecum   COLONOSCOPY     COLONOSCOPY N/A 08/27/2012   Procedure: COLONOSCOPY;  Surgeon: Claudis PENNER Golda, MD;  Location: [facility];  Service: Endoscopy;  Laterality: N/A;  1200-moved to 13:15 Ann to notify pt   COLONOSCOPY N/A 12/04/2018   Procedure: COLONOSCOPY;  Surgeon: Golda Claudis PENNER, MD;  Location: [facility];  Service: Endoscopy;  Laterality: N/A;  155pm   CYSTOSCOPY WITH INSERTION OF UROLIFT N/A 04/30/2022   Procedure: CYSTOSCOPY WITH INSERTION OF UROLIFT;  Surgeon: Sherrilee Belvie CROME, MD;  Location: [facility] OR[facility];  Service: Urology;  Laterality: N/A;   ESOPHAGOGASTRODUODENOSCOPY N/A 07/09/2023   Procedure: EGD (ESOPHAGOGASTRODUODENOSCOPY);  Surgeon: Eartha Flavors, Toribio, MD;  Location: [facility];  Service: Gastroenterology;  Laterality: N/A;  7:30AM;ASA 3   ESOPHAGOGASTRODUODENOSCOPY (EGD) WITH PROPOFOL   01/04/2023   Procedure: ESOPHAGOGASTRODUODENOSCOPY (EGD) WITH PROPOFOL ;  Surgeon: Cindie Carlin POUR, DO;  Location: [facility];  Service: Endoscopy;;   ESOPHAGOGASTRODUODENOSCOPY (EGD) WITH PROPOFOL  N/A 04/09/2023   Procedure: ESOPHAGOGASTRODUODENOSCOPY (EGD) WITH PROPOFOL ;  Surgeon: Eartha Flavors Toribio, MD;  Location: [facility];  Service: Gastroenterology;  Laterality: N/A;  7:30am;asa 3   HERNIA REPAIR Left    MOLE REMOVAL     From head , chest   UPPER GASTROINTESTINAL ENDOSCOPY       reports that he has never smoked. He has never used smokeless tobacco. He reports that he does not  drink alcohol  and does not use drugs.  No Known Allergies  Family History  Problem Relation Age of Onset   Arrhythmia Mother    Anuerysm Father    Hypertension Father    Diabetes Father    Healthy Sister    Healthy Sister    Irritable bowel syndrome Daughter    Thyroid  disease Daughter     Prior to Admission medications   Medication Sig Start Date End Date Taking? Authorizing Provider  apixaban (ELIQUIS) 5 MG  TABS tablet Take 5 mg by mouth 2 (two) times daily. Patient not taking: Reported on 07/23/2023    [provider]  atorvastatin (LIPITOR) 20 MG tablet Take 20 mg by mouth daily. 01/16/22   [provider]  esomeprazole  (NEXIUM ) 40 MG capsule Take 1 capsule (40 mg total) by mouth 2 (two) times daily before a meal. 04/09/23   Eartha Flavors, Toribio, MD  finasteride  (PROSCAR ) 5 MG tablet Take 1 tablet (5 mg total) by mouth daily. TAKE 1 TABLET(5 MG) BY MOUTH DAILY 04/23/23   McKenzie, Belvie CROME, MD  fluticasone [facility] 50 MCG/ACT nasal spray Place 2 sprays into both nostrils daily as needed for allergies. Patient not taking: Reported on 05/20/2023    [provider]  mesalamine  (LIALDA ) 1.2 g EC tablet TAKE 2 TABLETS(2.4 GRAMS) BY MOUTH TWICE DAILY 10/30/22   Castaneda Mayorga, Toribio, MD  mirabegron  ER (MYRBETRIQ ) 50 MG TB24 tablet Take 1 tablet (50 mg total) by mouth daily. 07/23/23   Gerldine Lauraine BROCKS, FNP  Multiple Vitamin (MULTIVITAMIN) tablet Take 1 tablet by mouth daily.    [provider]  sucralfate  (CARAFATE ) 1 GM/10ML suspension Take 10 mLs (1 g total) by mouth 4 (four) times daily -  with meals and at bedtime. 05/20/23   Eartha Flavors Toribio, MD  tamsulosin  (FLOMAX ) 0.4 MG CAPS capsule Take 1 capsule (0.4 mg total) by mouth daily. 07/23/23   Larocco, Lauraine BROCKS, FNP  VYZULTA 0.024 % SOLN Place 1 drop into both eyes every evening. 04/20/22   [provider]    Physical Exam: Vitals:   08/13/23 1516 08/13/23 1545 08/13/23 1600 08/13/23 1606  BP:  (!) 187/79 (!) 165/86   Pulse:  (!) 57 61   Resp:    18  Temp:    98.2 F (36.8 C)  TempSrc:    Oral  SpO2:  95% 96%   Weight: 77.1 kg     Height: 5' 6 (1.676 m)       Constitutional: NAD, calm, comfortable Vitals:   08/13/23 1516 08/13/23 1545 08/13/23 1600 08/13/23 1606  BP:  (!) 187/79 (!) 165/86   Pulse:  (!) 57 61   Resp:    18  Temp:    98.2 F (36.8 C)  TempSrc:    Oral  SpO2:  95% 96%    Weight: 77.1 kg     Height: 5' 6 (1.676 m)      Eyes: PERRL, lids and conjunctivae normal ENMT: Mucous membranes are moist.  NG tube in place Neck: normal, supple, no masses, no thyromegaly Respiratory: clear to auscultation bilaterally, no wheezing, no crackles. Normal respiratory effort. No accessory muscle use.  Cardiovascular: Regular rate and rhythm, no murmurs / rubs / gallops. No extremity edema.    Abdomen: Distended, soft, no tenderness, no masses palpated. No hepatosplenomegaly. Musculoskeletal: no clubbing / cyanosis. No joint deformity upper and lower extremities.  Skin: no rashes, lesions, ulcers. No induration Neurologic: No facial asymmetry, moving extremity spontaneously, speech  fluent Psychiatric: Normal judgment and insight. Alert and oriented x 3. Normal mood.   Labs on Admission: I have personally reviewed following labs and imaging studies  CBC: Recent Labs  Lab 08/13/23 1530  WBC 18.2*  HGB 13.1  HCT 39.5  MCV 89.4  PLT 205   Basic Metabolic Panel: Recent Labs  Lab 08/13/23 1530  NA 137  K 3.8  CL 103  CO2 23  GLUCOSE 164*  BUN 20  CREATININE 0.91  CALCIUM 9.7   GFR: Estimated Creatinine Clearance: 59.1 mL/min (by C-G formula based on SCr of 0.91 mg/dL). Liver Function Tests: Recent Labs  Lab 08/13/23 1530  AST 22  ALT 18  ALKPHOS 55  BILITOT 0.7  PROT 7.0  ALBUMIN 3.7   Recent Labs  Lab 08/13/23 1530  LIPASE 25   Coagulation Profile: Recent Labs  Lab 08/13/23 1551  INR 1.0   Urine analysis:    Component Value Date/Time   COLORURINE STRAW (A) 08/13/2023 1805   APPEARANCEUR CLEAR 08/13/2023 1805   APPEARANCEUR Clear 07/23/2023 0937   LABSPEC 1.035 (H) 08/13/2023 1805   PHURINE 6.0 08/13/2023 1805   GLUCOSEU NEGATIVE 08/13/2023 1805   HGBUR NEGATIVE 08/13/2023 1805   BILIRUBINUR NEGATIVE 08/13/2023 1805   BILIRUBINUR Negative 07/23/2023 0937   KETONESUR NEGATIVE 08/13/2023 1805   PROTEINUR NEGATIVE 08/13/2023 1805    NITRITE NEGATIVE 08/13/2023 1805   LEUKOCYTESUR NEGATIVE 08/13/2023 1805    Radiological Exams on Admission: CT ABDOMEN PELVIS W CONTRAST Result Date: 08/13/2023 CLINICAL DATA:  Acute generalized abdominal pain, vomiting. EXAM: CT ABDOMEN AND PELVIS WITH CONTRAST TECHNIQUE: Multidetector CT imaging of the abdomen and pelvis was performed using the standard protocol following bolus administration of intravenous contrast. RADIATION DOSE REDUCTION: This exam was performed according to the departmental dose-optimization program which includes automated exposure control, adjustment of the mA and/or kV according to patient size and/or use of iterative reconstruction technique. CONTRAST:  OMNIPAQUE  IOHEXOL  300 MG/ML  SOLN COMPARISON:  December 31, 2022. FINDINGS: Lower chest: No acute abnormality. Hepatobiliary: No focal liver abnormality is seen. No gallstones, gallbladder wall thickening, or biliary dilatation. Pancreas: Unremarkable. No pancreatic ductal dilatation or surrounding inflammatory changes. Spleen: Normal in size without focal abnormality. Adrenals/Urinary Tract: Adrenal glands appear normal. Left renal cysts are noted. No hydronephrosis or renal obstruction is noted. Urinary bladder is unremarkable. Stomach/Bowel: Stomach is unremarkable. The appendix is not clearly visualized. The colon is unremarkable. However, there is severe wall thickening and inflammatory changes seen involving the ileum concerning for possible closed loop obstruction or internal hernia given focal narrowing seen on image number 43 of series 2. This appears to result in dilatation of more proximal small bowel loops. Vascular/Lymphatic: Aortic atherosclerosis. No enlarged abdominal or pelvic lymph nodes. Reproductive: Prostate is unremarkable. Other: Small fat containing periumbilical hernia. Small amount of free fluid or ascites is noted around the liver and spleen. It is also present in the right lower quadrant.  Musculoskeletal: No acute or significant osseous findings. IMPRESSION: Dilated bowel loops are noted in the right lower quadrant with severe inflammatory changes concerning for possible closed loop obstruction, small bowel volvulus or internal hernia. Small amount of free fluid or ascites is noted as well. Critical Value/emergent results were called by telephone at the time of interpretation on 08/13/2023 at 5:23 pm to provider [facility] , who verbally acknowledged these results. Electronically Signed   By: Lynwood Landy Raddle M.D.   On: 08/13/2023 17:24   EKG: Independently reviewed.  Sinus  rhythm, rate 60, QTc 463.  No significant change from prior..  Assessment/Plan Principal Problem:   SBO (small bowel obstruction) (HCC) Active Problems:   Ulcerative colitis (HCC)   Benign prostatic hyperplasia with urinary obstruction  Assessment and Plan: * SBO (small bowel obstruction) (HCC) Presenting with abdominal pain, vomiting and diarrhea.  Reports black vomitus and black stools.  Stool occult negative.  Leukocytosis of 18.2.  Lactic acid of 2.3 > 2.1.  His eliquis was d/c 7/19.  History of ulcerative colitis, GI ulcers. - CTAP WC- dilated bowel loops are noted in the right lower quadrant with severe inflammatory changes concerning for possible closed loop obstruction, small bowel volvulus or internal hernia.  -N.p.o. -EDP talked to Dr. Mavis - NG tube, will see in consult -IV Zosyn  - 2 L bolus given, continue D5 N/s + 20 kcl 75cc/hr - Zofran  as needed - IV Morphine  2 mg as needed  Benign prostatic hyperplasia with urinary obstruction Hold tamsulosin , mirabegron  and finasteride  while n.p.o.  Ulcerative colitis (HCC) Holding mesalamine  while n.p.o.  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 His anticoagulation with Eliquis was discontinued 7/19.  DVT prophylaxis: SCDS Code Status:  FULL Family Communication:  None at bedside Disposition Plan:  > 2 days Consults called: Gen Surg Admission status:  Inpt Tele  I certify that at the point of admission it is my clinical judgment that the patient will require inpatient hospital care spanning beyond 2 midnights from the point of admission due to high intensity of service, high risk for further deterioration and high frequency of surveillance required.   Author: Tully FORBES Carwin, MD 08/13/2023 8:32 PM  For on call review [email or web address].

## 2023-08-13 NOTE — Assessment & Plan Note (Signed)
 Hold tamsulosin , mirabegron  and finasteride  while n.p.o.

## 2023-08-14 DIAGNOSIS — R109 Unspecified abdominal pain: Principal | ICD-10-CM

## 2023-08-14 DIAGNOSIS — K529 Noninfective gastroenteritis and colitis, unspecified: Secondary | ICD-10-CM

## 2023-08-14 DIAGNOSIS — K5000 Crohn's disease of small intestine without complications: Secondary | ICD-10-CM

## 2023-08-14 DIAGNOSIS — K56609 Unspecified intestinal obstruction, unspecified as to partial versus complete obstruction: Secondary | ICD-10-CM | POA: Diagnosis not present

## 2023-08-14 LAB — BASIC METABOLIC PANEL WITH GFR
Anion gap: 10 (ref 5–15)
BUN: 15 mg/dL (ref 8–23)
CO2: 24 mmol/L (ref 22–32)
Calcium: 9.5 mg/dL (ref 8.9–10.3)
Chloride: 106 mmol/L (ref 98–111)
Creatinine, Ser: 0.88 mg/dL (ref 0.61–1.24)
GFR, Estimated: >60 mL/min (ref >60)
Glucose, Bld: 129 mg/dL — ABNORMAL HIGH (ref 70–99)
Potassium: 4.1 mmol/L (ref 3.5–5.1)
Sodium: 140 mmol/L (ref 135–145)

## 2023-08-14 LAB — CBC
HCT: 39.7 % (ref 39.0–52.0)
Hemoglobin: 13 g/dL (ref 13.0–17.0)
MCH: 29.6 pg (ref 26.0–34.0)
MCHC: 32.7 g/dL (ref 30.0–36.0)
MCV: 90.4 fL (ref 80.0–100.0)
Platelets: 188 K/uL (ref 150–400)
RBC: 4.39 MIL/uL (ref 4.22–5.81)
RDW: 13.7 % (ref 11.5–15.5)
WBC: 13.3 K/uL — ABNORMAL HIGH (ref 4.0–10.5)
nRBC: 0 % (ref 0.0–0.2)

## 2023-08-14 LAB — OCCULT BLOOD, POC DEVICE: Fecal Occult Bld: NEGATIVE

## 2023-08-14 MED ORDER — METHYLPREDNISOLONE SODIUM SUCC 125 MG IJ SOLR
60.0000 mg | INTRAMUSCULAR | Status: DC
  Administered 2023-08-14 – 2023-08-16 (×3): 60 mg via INTRAVENOUS
  Filled 2023-08-14 (×3): qty 2

## 2023-08-14 MED ORDER — KCL IN DEXTROSE-NACL 20-5-0.9 MEQ/L-%-% IV SOLN: INTRAVENOUS | Status: AC

## 2023-08-14 MED ORDER — MENTHOL 3 MG MT LOZG
1.0000 | LOZENGE | OROMUCOSAL | Status: DC | PRN
  Administered 2023-08-14: 3 mg via ORAL
  Filled 2023-08-14: qty 9

## 2023-08-14 NOTE — TOC CM/SW Note (Signed)
 Transition of Care [facility]) - Inpatient Brief Assessment   Patient Details  Name: NATALE THOMA MRN: [id number] Date of Birth: [birth year]  [facility]) CM/SW Contact:    Noreen KATHEE Cleotilde ISRAEL Phone Number: 08/14/2023, 9:18 AM   Clinical Narrative:  Transition of Care Department [facility]) has reviewed patient and no TOC needs have been identified at this time. We will continue to monitor patient advancement through interdisciplinary progression rounds. If new patient transition needs arise, please place a TOC consult.   Transition of Care Asessment: Insurance and Status: Insurance coverage has been reviewed Patient has primary care physician: Yes Home environment has been reviewed: Single Family Home Prior level of function:: Independent Prior/Current Home Services: No current home services Social Drivers of Health Review: SDOH reviewed no interventions necessary Readmission risk has been reviewed: Yes Transition of care needs: no transition of care needs at this time

## 2023-08-14 NOTE — Plan of Care (Signed)
   Problem: Education: Goal: Knowledge of General Education information will improve Description Including pain rating scale, medication(s)/side effects and non-pharmacologic comfort measures Outcome: Progressing

## 2023-08-14 NOTE — Consult Note (Signed)
 Referring Provider: No ref. provider found Primary Care Physician:  Lari Elspeth BRAVO, MD Primary Gastroenterologist:  Dr. Eartha   Reason for Consultation:  Small bowel obstruction   HPI:  David Mooney is a 83 y.o. 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 Imaging concerning for closed loop small bowel obstruction,  General surgery was consulted along with GI for further evaluation  CT A/P with contrast Dilated bowel loops noted in the right lower quadrant with severe inflammatory changes concerning for possible closed-loop obstruction, small bowel volvulus or internal hernia Small amount of free fluid or ascites noted as well  NG tube was placed for decompression  CMP grossly unremarkable WBC 18.2 FOBT negative Lactic acid 2.3, 2.1 Blood cultures negative thus far Hemoglobin 13.1   Consult: Patient reports he noticed his stomach not feeling well upon waking yesterday. He got up and ate some breakfast thinking this may help but notes he actually felt worse after this, prompting him to ask his wife to call EMS for him. He endorses nausea, vomiting. With 4 episodes of emesis, no hematemesis or coffee ground emesis. Had one darker to green in color BM but states it did not appear black, no BRBPR. Stool was looser to watery. He notes that he had no improvement in abdominal pain with defecation. He is not passing flatus or belching at this time but was belching yesterday morning.   He reports at baseline leading up to yesterday, he was having up to 2-3 BMs per day with stools looser to formed. He denies any abdominal pain until yesterday.   Has had about 100 cc output via NG tube.   Last EGD:06/2023 - Discolor mucosa in esophagus -Pills found in stomach -Nonbleeding gastric  ulcer with clean ulcer base (Forrest class III), partial pyloric stenosis Normal examined duodenum  Biopsy with nonspecific reactive gastropathy, esophageal squamous mucosa with patchy superficial sloughing, otherwise unremarkable  Recommended repeat endoscopy in 4 months for treatment of stenosis  Last colonoscopy: November 2020 -Examined portion of ileum was normal - Congested mucosa in cecum - Scar in entire examined colon - One 5 mm polyp at splenic flexure - 1 small polyp at splenic flexure  Biopsy showed mildly active chronic colitis Tubular adenoma x 2  Recommended repeat colonoscopy 5 years  Past Medical History:  Diagnosis Date   Arthritis    Cataract    Complication of anesthesia    Embolus (HCC) 12/2022   left Hip   GERD (gastroesophageal reflux disease)    Glaucoma    Hernia of unspecified site of abdominal cavity without mention of obstruction or gangrene    History of gastric ulcer    History of kidney stones    PONV (postoperative nausea and vomiting)    Ulcerative colitis     Past Surgical History:  Procedure Laterality Date   BIOPSY  12/04/2018   Procedure: BIOPSY;  Surgeon: Golda Claudis PENNER, MD;  Location: [facility];  Service: Endoscopy;;  cecum   COLONOSCOPY     COLONOSCOPY N/A 08/27/2012   Procedure: COLONOSCOPY;  Surgeon: Claudis PENNER Golda, MD;  Location: [facility];  Service: Endoscopy;  Laterality: N/A;  1200-moved to 13:15 Ann to notify pt   COLONOSCOPY N/A 12/04/2018   Procedure: COLONOSCOPY;  Surgeon: Golda Claudis PENNER, MD;  Location: [facility];  Service: Endoscopy;  Laterality: N/A;  155pm   CYSTOSCOPY WITH INSERTION OF UROLIFT N/A 04/30/2022   Procedure: CYSTOSCOPY WITH INSERTION OF UROLIFT;  Surgeon: Sherrilee Belvie CROME, MD;  Location: [facility];  Service: Urology;  Laterality: N/A;   ESOPHAGOGASTRODUODENOSCOPY N/A 07/09/2023   Procedure: EGD (ESOPHAGOGASTRODUODENOSCOPY);  Surgeon: Eartha Flavors, Toribio, MD;  Location: [facility];   Service: Gastroenterology;  Laterality: N/A;  7:30AM;ASA 3   ESOPHAGOGASTRODUODENOSCOPY (EGD) WITH PROPOFOL   01/04/2023   Procedure: ESOPHAGOGASTRODUODENOSCOPY (EGD) WITH PROPOFOL ;  Surgeon: Cindie Carlin POUR, DO;  Location: [facility];  Service: Endoscopy;;   ESOPHAGOGASTRODUODENOSCOPY (EGD) WITH PROPOFOL  N/A 04/09/2023   Procedure: ESOPHAGOGASTRODUODENOSCOPY (EGD) WITH PROPOFOL ;  Surgeon: Eartha Flavors Toribio, MD;  Location: [facility];  Service: Gastroenterology;  Laterality: N/A;  7:30am;asa 3   HERNIA REPAIR Left    MOLE REMOVAL     From head , chest   UPPER GASTROINTESTINAL ENDOSCOPY      Prior to Admission medications   Medication Sig Start Date End Date Taking? Authorizing Provider  acetaminophen  (TYLENOL ) 650 MG CR tablet Take 1,300 mg by mouth every 8 (eight) hours as needed for pain.   Yes [provider]  atorvastatin (LIPITOR) 20 MG tablet Take 20 mg by mouth daily. 01/16/22  Yes [provider]  cephALEXin (KEFLEX) 500 MG capsule Take 500 mg by mouth 3 (three) times daily. 08/09/23  Yes [provider]  esomeprazole  (NEXIUM ) 40 MG capsule Take 1 capsule (40 mg total) by mouth 2 (two) times daily before a meal. 04/09/23  Yes Eartha Flavors, Daniel, MD  finasteride  (PROSCAR ) 5 MG tablet Take 1 tablet (5 mg total) by mouth daily. TAKE 1 TABLET(5 MG) BY MOUTH DAILY 04/23/23  Yes McKenzie, Belvie CROME, MD  Guaifenesin [facility] MAXIMUM STRENGTH) 1200 MG TB12 Take 1 tablet by mouth 2 (two) times daily as needed (chest congestion).   Yes [provider]  mesalamine  (LIALDA ) 1.2 g EC tablet TAKE 2 TABLETS(2.4 GRAMS) BY MOUTH TWICE DAILY 10/30/22  Yes Eartha Flavors Toribio, MD  mirabegron  ER (MYRBETRIQ ) 50 MG TB24 tablet Take 1 tablet (50 mg total) by mouth daily. 07/23/23  Yes Gerldine Lauraine BROCKS, FNP  Multiple Vitamin (MULTIVITAMIN) tablet Take 1 tablet by mouth daily.   Yes [provider]  predniSONE (STERAPRED UNI-PAK 21 TAB) 10 MG (21)  TBPK tablet Take 10 mg by mouth as directed. Dose pack 08/09/23  Yes [provider]  sucralfate  (CARAFATE ) 1 GM/10ML suspension Take 10 mLs (1 g total) by mouth 4 (four) times daily -  with meals and at bedtime. 05/20/23  Yes Eartha Flavors Toribio, MD  tamsulosin  (FLOMAX ) 0.4 MG CAPS capsule Take 1 capsule (0.4 mg total) by mouth daily. 07/23/23  Yes Gerldine Lauraine BROCKS, FNP  apixaban (ELIQUIS) 5 MG TABS tablet Take 5 mg by mouth 2 (two) times daily.    [provider]    Current Facility-Administered Medications  Medication Dose Route Frequency Provider Last Rate Last Admin   acetaminophen  (TYLENOL ) tablet 650 mg  650 mg Per Tube Q6H PRN Emokpae, Ejiroghene E, MD       Or   acetaminophen  (TYLENOL ) suppository 650 mg  650 mg Rectal Q6H PRN Emokpae, Ejiroghene E, MD       dextrose  5 % and 0.9 % NaCl with KCl 20 mEq/L infusion   Intravenous Continuous Emokpae, Ejiroghene E, MD 75 mL/hr at 08/13/23 2348 Restarted at 08/13/23 2348   menthol -cetylpyridinium (CEPACOL) lozenge 3 mg  1 lozenge Oral PRN Mavis Anes, MD   3 mg at 08/14/23 9162   morphine  (PF) 2 MG/ML injection 2 mg  2 mg Intravenous Q4H PRN Emokpae, Ejiroghene E, MD   2 mg at 08/14/23 9478   ondansetron  (ZOFRAN ) tablet 4 mg  4 mg Per Tube Q6H PRN Emokpae, Ejiroghene E, MD       Or   ondansetron  (ZOFRAN ) injection 4 mg  4 mg Intravenous Q6H PRN Emokpae, Ejiroghene E, MD       pantoprazole  (PROTONIX ) injection 40 mg  40 mg Intravenous Q12H Emokpae, Ejiroghene E, MD   40 mg at 08/14/23 0836   piperacillin -tazobactam (ZOSYN ) IVPB 3.375 g  3.375 g Intravenous Q8H Nazari, Walid A, RPH 12.5 mL/hr at 08/14/23 0518 3.375 g at 08/14/23 0518    Allergies as of 08/13/2023   (No Known Allergies)    Review of Systems: Gen: Denies fever, chills, loss of appetite, change in weight or weight loss CV: Denies chest pain, heart palpitations, syncope, edema  Resp: Denies shortness of breath with rest, cough, wheezing GI:  denies  hematochezia, constipation, dysphagia, odyonophagia, early satiety or weight loss. +darker stools +nausea +vomiting +abdominal pain +looser stools GU : Denies urinary burning, urinary frequency, urinary incontinence.  MS: Denies joint pain,swelling, cramping Derm: Denies rash, itching, dry skin Psych: Denies depression, anxiety,confusion, or memory loss Heme: Denies bruising, bleeding, and enlarged lymph nodes.  Physical Exam: Vital signs in last 24 hours: Temp:  [97.5 F (36.4 C)-98.5 F (36.9 C)] 98.5 F (36.9 C) (07/30 0815) Pulse Rate:  [57-69] 69 (07/30 0815) Resp:  [18-20] 19 (07/30 0815) BP: (140-187)/(75-86) 140/83 (07/30 0815) SpO2:  [95 %-100 %] 98 % (07/30 0815) FiO2 (%):  [21 %] 21 % (07/29 2029) Weight:  [77.1 kg-78.6 kg] 78.6 kg (07/29 2003) Last BM Date : 08/13/23 General:   Alert,  Well-developed, well-nourished, pleasant and cooperative in NAD Head:  Normocephalic and atraumatic. Eyes:  Sclera clear, no icterus.   Conjunctiva pink. Ears:  Normal auditory acuity. Nose:  No deformity, discharge,  or lesions. Mouth:  No deformity or lesions, dentition normal. Neck:  Supple; no masses or thyromegaly. Lungs:  Clear throughout to auscultation.   No wheezes, crackles, or rhonchi. No acute distress. Heart:  Regular rate and rhythm; no murmurs, clicks, rubs,  or gallops. Abdomen:   mild TTP to RLQ, full but soft. Bowel sounds present, mildly diminished. No masses, hepatosplenomegaly or hernias noted. without guarding, and without rebound.   Rectal:  Deferred until time of colonoscopy.   Msk:  Symmetrical without gross deformities. Normal posture. Extremities:  Without clubbing or edema. Neurologic:  Alert and  oriented x4;  grossly normal neurologically. Skin:  Intact without significant lesions or rashes. Psych:  Alert and cooperative. Normal mood and affect.  Intake/Output from previous day: 07/29 0701 - 07/30 0700 In: 670.4 [I.V.:124.5; IV Piggyback:545.9] Out: 875  [Urine:625; Emesis/NG output:100; Blood:150] Intake/Output this shift: Total I/O In: 30 [NG/GT:30] Out: -   Lab Results: Recent Labs    08/13/23 1530 08/13/23 2138 08/14/23 0410  WBC 18.2*  --  13.3*  HGB 13.1 12.2* 13.0  HCT 39.5 37.6* 39.7  PLT 205  --  188   BMET Recent Labs    08/13/23 1530 08/14/23 0410  NA 137 140  K 3.8 4.1  CL 103 106  CO2 23 24  GLUCOSE 164* 129*  BUN 20 15  CREATININE 0.91 0.88  CALCIUM 9.7 9.5   LFT Recent Labs  08/13/23 1530  PROT 7.0  ALBUMIN 3.7  AST 22  ALT 18  ALKPHOS 55  BILITOT 0.7   PT/INR Recent Labs    08/13/23 1551  LABPROT 14.1  INR 1.0   Studies/Results: DG Chest Portable 1 View Result Date: 08/13/2023 CLINICAL DATA:  NG tube placement EXAM: PORTABLE CHEST 1 VIEW COMPARISON:  01/04/2023 FINDINGS: NG tube tip is near the GE junction. The side port is in the distal esophagus. This could be advanced several cm for optimal positioning in the stomach. Heart is normal size. Visualized lungs clear. No effusions. IMPRESSION: NG tube tip near the GE junction. Recommend advancing several cm further into the stomach. Electronically Signed   By: Franky Crease M.D.   On: 08/13/2023 18:56   CT ABDOMEN PELVIS W CONTRAST Result Date: 08/13/2023 CLINICAL DATA:  Acute generalized abdominal pain, vomiting. EXAM: CT ABDOMEN AND PELVIS WITH CONTRAST TECHNIQUE: Multidetector CT imaging of the abdomen and pelvis was performed using the standard protocol following bolus administration of intravenous contrast. RADIATION DOSE REDUCTION: This exam was performed according to the departmental dose-optimization program which includes automated exposure control, adjustment of the mA and/or kV according to patient size and/or use of iterative reconstruction technique. CONTRAST:  OMNIPAQUE  IOHEXOL  300 MG/ML  SOLN COMPARISON:  December 31, 2022. FINDINGS: Lower chest: No acute abnormality. Hepatobiliary: No focal liver abnormality is seen. No  gallstones, gallbladder wall thickening, or biliary dilatation. Pancreas: Unremarkable. No pancreatic ductal dilatation or surrounding inflammatory changes. Spleen: Normal in size without focal abnormality. Adrenals/Urinary Tract: Adrenal glands appear normal. Left renal cysts are noted. No hydronephrosis or renal obstruction is noted. Urinary bladder is unremarkable. Stomach/Bowel: Stomach is unremarkable. The appendix is not clearly visualized. The colon is unremarkable. However, there is severe wall thickening and inflammatory changes seen involving the ileum concerning for possible closed loop obstruction or internal hernia given focal narrowing seen on image number 43 of series 2. This appears to result in dilatation of more proximal small bowel loops. Vascular/Lymphatic: Aortic atherosclerosis. No enlarged abdominal or pelvic lymph nodes. Reproductive: Prostate is unremarkable. Other: Small fat containing periumbilical hernia. Small amount of free fluid or ascites is noted around the liver and spleen. It is also present in the right lower quadrant. Musculoskeletal: No acute or significant osseous findings. IMPRESSION: Dilated bowel loops are noted in the right lower quadrant with severe inflammatory changes concerning for possible closed loop obstruction, small bowel volvulus or internal hernia. Small amount of free fluid or ascites is noted as well. Critical Value/emergent results were called by telephone at the time of interpretation on 08/13/2023 at 5:23 pm to provider [facility] , who verbally acknowledged these results. Electronically Signed   By: Lynwood Landy Raddle M.D.   On: 08/13/2023 17:24    Impression: David Mooney is a 83 y.o. 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 Imaging concerning for  closed loop small bowel obstruction,  General surgery was consulted along with GI for further evaluation  Small bowel obstruction: -sudden onset of abdominal pain, nausea, vomiting, darker/looser stools but not black -in setting of Ulcerative colitis, maintained on Lialda  -FOBT negative -CT A/P with contrast Dilated bowel loops noted in the right lower quadrant with severe inflammatory changes concerning for possible closed-loop obstruction, small bowel volvulus or internal hernia,  Small amount of free fluid or ascites noted as well -general surgery on board with plans to decompress via NG tube, no surgical intervention at this time -last TCS in 2020/2014 with inflammation only in the cecum -no evidence of small bowel disease previously to raise concern for Crohn's disease though CT findings this admission certainly raises concern for Crohn's given inflammatory changes in small bowel -will need outpatient colonoscopy in 6-8 weeks -WBC 18.2 initially, down to 13.2, LA elevated but blood cultures thus far negative, likely -recommend starting steroids due to concern for SBO likely secondary to IBD reactive/inflammatory response vs. Overt infection    Plan: -remain NPO -start solumedrol 60mg  daily -outpatient colonoscopy in 6-8 weeks  -continue with NG tube decompression -appreciate General surgery input -continue supportive measures -OOB as tolerated    LOS: 1 day    08/14/2023, 9:32 AM  Tyrek Lawhorn L. Camarion Weier, MSN, APRN, AGNP-C Adult-Gerontology Nurse Practitioner [facility]

## 2023-08-14 NOTE — Consult Note (Signed)
 Reason for Consult:Small Bowel Obstruction Referring Physician: Tully Carwin, MD  HPI: David Mooney is an 83 y.o. male with history of ulcerative colitis, BPH, kidney stones, and peptic ulcer disease, and remote history of left inguinal hernia repair in childhood presenting today with a one-day history of abdominal pain with associated vomiting of dark coloration and episodes of dark stools. It began yesterday morning at breakfast after he took his prednisone. He reports no changes to his diet in the past week and he only has taken prednisone lately as a new medication. His pain is located predominately in his right lower quadrant but he does have some in his right upper quadrant. He rated it initially as a 10/10 sharp and stabbing pain and it is around 5/10 today after morphine  and NG tube placement. He has never had any pain like this before. He had a CT performed yesterday that showed dilated bowel loops in the right lower quadrant consistent with small bowel obstruction. Fecal occult blood was negative and patient did not take bismuth recently. This morning he has not passed any flatus or had any episodes of nausea or vomiting. He has been able to urinate. He denies any dizziness or lightheadedness, shortness of breath, heart palpitations, Blood in urine or stool, fatigue or unintentional weight loss.    Past Medical History:  Diagnosis Date   Arthritis    Cataract    Complication of anesthesia    Embolus (HCC) 12/2022   left Hip   GERD (gastroesophageal reflux disease)    Glaucoma    Hernia of unspecified site of abdominal cavity without mention of obstruction or gangrene    History of gastric ulcer    History of kidney stones    PONV (postoperative nausea and vomiting)    Ulcerative colitis     Past Surgical History:  Procedure Laterality Date   BIOPSY  12/04/2018   Procedure: BIOPSY;  Surgeon: Golda Claudis PENNER, MD;  [facility];  Service: Endoscopy;;  cecum    COLONOSCOPY     COLONOSCOPY N/A 08/27/2012   Procedure: COLONOSCOPY;  Surgeon: Claudis PENNER Golda, MD;  [facility]: [facility];  Service: Endoscopy;  Laterality: N/A;  1200-moved to 13:15 Ann to notify pt   COLONOSCOPY N/A 12/04/2018   Procedure: COLONOSCOPY;  Surgeon: Golda Claudis PENNER, MD;  [facility];  Service: Endoscopy;  Laterality: N/A;  155pm   CYSTOSCOPY WITH INSERTION OF UROLIFT N/A 04/30/2022   Procedure: CYSTOSCOPY WITH INSERTION OF UROLIFT;  Surgeon: Sherrilee Belvie CROME, MD;  Location: [facility];  Service: Urology;  Laterality: N/A;   ESOPHAGOGASTRODUODENOSCOPY N/A 07/09/2023   Procedure: EGD (ESOPHAGOGASTRODUODENOSCOPY);  Surgeon: Eartha Flavors, Toribio, MD;  [facility]: [facility];  Service: Gastroenterology;  Laterality: N/A;  7:30AM;ASA 3   ESOPHAGOGASTRODUODENOSCOPY (EGD) WITH PROPOFOL   01/04/2023   Procedure: ESOPHAGOGASTRODUODENOSCOPY (EGD) WITH PROPOFOL ;  Surgeon: Cindie Carlin POUR, DO;  [facility];  Service: Endoscopy;;   ESOPHAGOGASTRODUODENOSCOPY (EGD) WITH PROPOFOL  N/A 04/09/2023   Procedure: ESOPHAGOGASTRODUODENOSCOPY (EGD) WITH PROPOFOL ;  Surgeon: Eartha Flavors Toribio, MD;  [facility];  Service: Gastroenterology;  Laterality: N/A;  7:30am;asa 3   HERNIA REPAIR Left    MOLE REMOVAL     From head , chest   UPPER GASTROINTESTINAL ENDOSCOPY      Family History  Problem Relation Age of Onset   Arrhythmia Mother    Anuerysm Father    Hypertension Father    Diabetes Father    Healthy Sister  Healthy Sister    Irritable bowel syndrome Daughter    Thyroid  disease Daughter     Social History:  reports that he has never smoked. He has never used smokeless tobacco. He reports that he does not drink alcohol  and does not use drugs.  Allergies: No Known Allergies  Medications: I have reviewed the patient's current medications.  Results for orders placed or performed during the hospital encounter of 08/13/23 (from the past 48  hours)  Lipase, blood     Status: None   Collection Time: 08/13/23  3:30 PM  Result Value Ref Range   Lipase 25 11 - 51 U/L    Comment: Performed at [facility], [address], [place], [place] [address]  Comprehensive metabolic panel     Status: Abnormal   Collection Time: 08/13/23  3:30 PM  Result Value Ref Range   Sodium 137 135 - 145 mmol/L   Potassium 3.8 3.5 - 5.1 mmol/L   Chloride 103 98 - 111 mmol/L   CO2 23 22 - 32 mmol/L   Glucose, Bld 164 (H) 70 - 99 mg/dL    Comment: Glucose reference range applies only to samples taken after fasting for at least 8 hours.   BUN 20 8 - 23 mg/dL   Creatinine, Ser 9.08 0.61 - 1.24 mg/dL   Calcium 9.7 8.9 - 89.6 mg/dL   Total Protein 7.0 6.5 - 8.1 g/dL   Albumin 3.7 3.5 - 5.0 g/dL   AST 22 15 - 41 U/L   ALT 18 0 - 44 U/L   Alkaline Phosphatase 55 38 - 126 U/L   Total Bilirubin 0.7 0.0 - 1.2 mg/dL   GFR, Estimated >39 >39 mL/min    Comment: (NOTE) Calculated using the CKD-EPI Creatinine Equation (2021)    Anion gap 11 5 - 15    Comment: Performed at [facility], [address] [place], [place] [address]  CBC     Status: Abnormal   Collection Time: 08/13/23  3:30 PM  Result Value Ref Range   WBC 18.2 (H) 4.0 - 10.5 K/uL   RBC 4.42 4.22 - 5.81 MIL/uL   Hemoglobin 13.1 13.0 - 17.0 g/dL   HCT 60.4 60.9 - 47.9 %   MCV 89.4 80.0 - 100.0 fL   MCH 29.6 26.0 - 34.0 pg   MCHC 33.2 30.0 - 36.0 g/dL   RDW 86.5 88.4 - 84.4 %   Platelets 205 150 - 400 K/uL   nRBC 0.0 0.0 - 0.2 %    Comment: Performed at [facility], [address], [place], [place] [address]  Type and screen [facility]     Status: None   Collection Time: 08/13/23  3:51 PM  Result Value Ref Range   ABO/RH(D) O POS    Antibody Screen NEG    Sample Expiration      08/16/2023,2359 Performed at [facility], [address] [place], [place] [address]   Lactic acid, plasma     Status: Abnormal   Collection Time: 08/13/23  3:51 PM  Result Value Ref Range   Lactic  Acid, Venous 2.3 (HH) 0.5 - 1.9 mmol/L    Comment: CRITICAL RESULT CALLED TO, READ BACK BY AND VERIFIED WITH [place],M ON 08/13/23 AT 1725 BY LOY,C Performed at [facility], [address], [place], [place] [address]   Protime-INR     Status: None   Collection Time: 08/13/23  3:51 PM  Result Value Ref Range   Prothrombin Time 14.1 11.4 -  15.2 seconds   INR 1.0 0.8 - 1.2    Comment: (NOTE) INR goal varies based on device and disease states. Performed at [facility], [address], [place], [place] [address]   Lactic acid, plasma     Status: Abnormal   Collection Time: 08/13/23  5:43 PM  Result Value Ref Range   Lactic Acid, Venous 2.1 (HH) 0.5 - 1.9 mmol/L    Comment: CRITICAL VALUE NOTED.  VALUE IS CONSISTENT WITH PREVIOUSLY REPORTED AND CALLED VALUE. Performed at [facility], [address] [place], [place] [address]   Culture, blood (routine x 2)     Status: None (Preliminary result)   Collection Time: 08/13/23  5:43 PM   Specimen: Blood  Result Value Ref Range   Specimen Description BLOOD LEFT ANTECUBITAL    Special Requests      BOTTLES DRAWN AEROBIC AND ANAEROBIC Blood Culture adequate volume Performed at [facility], [address], [place], [place] [address]    Culture PENDING    Report Status PENDING   Culture, blood (routine x 2)     Status: None (Preliminary result)   Collection Time: 08/13/23  5:43 PM   Specimen: Blood  Result Value Ref Range   Specimen Description BLOOD RIGHT ANTECUBITAL    Special Requests      BOTTLES DRAWN AEROBIC AND ANAEROBIC Blood Culture adequate volume Performed at [facility], [address], [place], [place] [address]    Culture PENDING    Report Status PENDING   Urinalysis, Routine w reflex microscopic -Urine, Clean Catch     Status: Abnormal   Collection Time: 08/13/23  6:05 PM  Result Value Ref Range   Color, Urine STRAW (A) YELLOW   APPearance CLEAR CLEAR   Specific Gravity, Urine 1.035 (H) 1.005 - 1.030   pH 6.0 5.0 - 8.0    Glucose, UA NEGATIVE NEGATIVE mg/dL   Hgb urine dipstick NEGATIVE NEGATIVE   Bilirubin Urine NEGATIVE NEGATIVE   Ketones, ur NEGATIVE NEGATIVE mg/dL   Protein, ur NEGATIVE NEGATIVE mg/dL   Nitrite NEGATIVE NEGATIVE   Leukocytes,Ua NEGATIVE NEGATIVE    Comment: Performed at [facility], [address] [place], [place] [address]  Hemoglobin and hematocrit, blood     Status: Abnormal   Collection Time: 08/13/23  9:38 PM  Result Value Ref Range   Hemoglobin 12.2 (L) 13.0 - 17.0 g/dL   HCT 62.3 (L) 60.9 - 47.9 %    Comment: Performed at [facility], [address], [place], [place] [address]  Basic metabolic panel     Status: Abnormal   Collection Time: 08/14/23  4:10 AM  Result Value Ref Range   Sodium 140 135 - 145 mmol/L   Potassium 4.1 3.5 - 5.1 mmol/L   Chloride 106 98 - 111 mmol/L   CO2 24 22 - 32 mmol/L   Glucose, Bld 129 (H) 70 - 99 mg/dL    Comment: Glucose reference range applies only to samples taken after fasting for at least 8 hours.   BUN 15 8 - 23 mg/dL   Creatinine, Ser 9.11 0.61 - 1.24 mg/dL   Calcium 9.5 8.9 - 10.3 mg/dL   GFR, Estimated >39 >39 mL/min    Comment: (NOTE) Calculated using the CKD-EPI Creatinine Equation (2021)    Anion gap 10 5 - 15    Comment: Performed at [facility], [address] [place], [place] [address]  CBC     Status: Abnormal   Collection Time: 08/14/23  4:10 AM  Result Value Ref Range   WBC 13.3 (H) 4.0 - 10.5 K/uL   RBC 4.39 4.22 - 5.81 MIL/uL   Hemoglobin 13.0 13.0 - 17.0 g/dL   HCT 60.2 60.9 - 47.9 %   MCV 90.4 80.0 - 100.0 fL   MCH 29.6 26.0 - 34.0 pg   MCHC 32.7 30.0 - 36.0 g/dL   RDW 86.2 88.4 - 84.4 %   Platelets 188 150 - 400 K/uL   nRBC 0.0 0.0 - 0.2 %    Comment: Performed at [facility], [address], [place], [place] [address]    DG Chest Portable 1 View Result Date: 08/13/2023 CLINICAL DATA:  NG tube placement EXAM: PORTABLE CHEST 1 VIEW COMPARISON:  01/04/2023 FINDINGS: NG tube tip is near the GE junction.  The side port is in the distal esophagus. This could be advanced several cm for optimal positioning in the stomach. Heart is normal size. Visualized lungs clear. No effusions. IMPRESSION: NG tube tip near the GE junction. Recommend advancing several cm further into the stomach. Electronically Signed   By: Franky Crease M.D.   On: 08/13/2023 18:56   CT ABDOMEN PELVIS W CONTRAST Result Date: 08/13/2023 CLINICAL DATA:  Acute generalized abdominal pain, vomiting. EXAM: CT ABDOMEN AND PELVIS WITH CONTRAST TECHNIQUE: Multidetector CT imaging of the abdomen and pelvis was performed using the standard protocol following bolus administration of intravenous contrast. RADIATION DOSE REDUCTION: This exam was performed according to the departmental dose-optimization program which includes automated exposure control, adjustment of the mA and/or kV according to patient size and/or use of iterative reconstruction technique. CONTRAST:  OMNIPAQUE  IOHEXOL  300 MG/ML  SOLN COMPARISON:  December 31, 2022. FINDINGS: Lower chest: No acute abnormality. Hepatobiliary: No focal liver abnormality is seen. No gallstones, gallbladder wall thickening, or biliary dilatation. Pancreas: Unremarkable. No pancreatic ductal dilatation or surrounding inflammatory changes. Spleen: Normal in size without focal abnormality. Adrenals/Urinary Tract: Adrenal glands appear normal. Left renal cysts are noted. No hydronephrosis or renal obstruction is noted. Urinary bladder is unremarkable. Stomach/Bowel: Stomach is unremarkable. The appendix is not clearly visualized. The colon is unremarkable. However, there is severe wall thickening and inflammatory changes seen involving the ileum concerning for possible closed loop obstruction or internal hernia given focal narrowing seen on image number 43 of series 2. This appears to result in dilatation of more proximal small bowel loops. Vascular/Lymphatic: Aortic atherosclerosis. No enlarged abdominal or pelvic  lymph nodes. Reproductive: Prostate is unremarkable. Other: Small fat containing periumbilical hernia. Small amount of free fluid or ascites is noted around the liver and spleen. It is also present in the right lower quadrant. Musculoskeletal: No acute or significant osseous findings. IMPRESSION: Dilated bowel loops are noted in the right lower quadrant with severe inflammatory changes concerning for possible closed loop obstruction, small bowel volvulus or internal hernia. Small amount of free fluid or ascites is noted as well. Critical Value/emergent results were called by telephone at the time of interpretation on 08/13/2023 at 5:23 pm to provider [facility] , who verbally acknowledged these results. Electronically Signed   By: Lynwood Landy Raddle M.D.   On: 08/13/2023 17:24    ROS:  Pertinent items are noted in HPI.  Blood pressure (!) 148/75, pulse 65, temperature 98.3 F (36.8 C), temperature source Oral, resp. rate 20, height 5' 6 (1.676 m), weight 78.6 kg, SpO2 98%. Physical Exam:  Physical Exam Constitutional:      General: He is not in acute distress.    Appearance: He  is well-developed.  HENT:     Head: Normocephalic and atraumatic.  Eyes:     Extraocular Movements: Extraocular movements intact.  Cardiovascular:     Rate and Rhythm: Normal rate and regular rhythm.     Heart sounds: Normal heart sounds.  Pulmonary:     Effort: Pulmonary effort is normal. No respiratory distress.     Breath sounds: Normal breath sounds.  Abdominal:     General: Abdomen is protuberant. Bowel sounds are increased. There is distension. There is no abdominal bruit.     Palpations: Abdomen is soft. There is no fluid wave, hepatomegaly, mass or pulsatile mass.     Tenderness: There is abdominal tenderness in the right upper quadrant and right lower quadrant. There is no guarding or rebound.     Hernia: No hernia is present.     Comments: Hyperactive bowel sounds  Skin:    General: Skin is warm and  dry.     Coloration: Skin is not jaundiced.  Neurological:     General: No focal deficit present.     Mental Status: He is alert.  Psychiatric:        Mood and Affect: Mood normal.     Assessment/Plan:  Impression: Small bowel obstruction   Assessment: Patient is in no acute distress but is having some moderate pain from his bowel distention. He has not yet had flatus but has been able to urinate.  He is appropriately tender to palpation and distended in the RLQ.  Plan: Continue decompression with NG tube. If able to pass flatus, could consider advancing to clear liquids at that time. No surgical intervention at this time; would consider if conservative measures fail to resolve.  Leonor JAYSON Clause 08/14/2023, 7:14 AM

## 2023-08-14 NOTE — Plan of Care (Signed)
  Problem: Education: Goal: Knowledge of General Education information will improve Description: Including pain rating scale, medication(s)/side effects and non-pharmacologic comfort measures 08/14/2023 1812 by Lynnette Cena CROME, RN Outcome: Progressing 08/14/2023 1609 by Lynnette Cena CROME, RN Outcome: Progressing

## 2023-08-14 NOTE — Progress Notes (Signed)
 PROGRESS NOTE    David Mooney  FMW:[id number] DOB: 08/24/1940 DOA: 08/13/2023 PCP: Lari Elspeth BRAVO, MD   Brief Narrative:    David Mooney is a 83 y.o. male with medical history significant for DVT, BPH, duodenal ulcer, ulcerative colitis. Patient presented to the ED with complaints of sudden onset of abdominal pain that started today, with abdominal bloating, he reports at least 4 episodes of vomiting-contents were black initially and then dark green, also reports black stools.  Patient was admitted with small bowel obstruction and has had NG tube placement.    7/30: General surgery evaluation not recommending intervention at this time and has suggested GI evaluation for enteritis likely secondary to inflammatory bowel disease.  GI has started patient on IV Solu-Medrol .  Assessment & Plan:   Principal Problem:   SBO (small bowel obstruction) (HCC) Active Problems:   Ulcerative colitis (HCC)   Benign prostatic hyperplasia with urinary obstruction  Assessment and Plan:  SBO (small bowel obstruction) (HCC) with concern for enteritis Presenting with abdominal pain, vomiting and diarrhea.  Reports black vomitus and black stools.  Stool occult negative.  Leukocytosis of 18.2.  Lactic acid of 2.3 > 2.1.  His eliquis was d/c 7/19.  History of ulcerative colitis, GI ulcers. - CTAP WC- dilated bowel loops are noted in the right lower quadrant with severe inflammatory changes concerning for possible closed loop obstruction, small bowel volvulus or internal hernia.  - Continue n.p.o. and NG tube decompression -Appreciate general surgery and GI recommendations with Solu-Medrol  initiated -Continue IV fluid -Continues on IV Zosyn    Benign prostatic hyperplasia with urinary obstruction Hold tamsulosin , mirabegron  and finasteride  while n.p.o.   Ulcerative colitis (HCC) Holding mesalamine  while n.p.o.   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 His anticoagulation with Eliquis was discontinued 7/19.  DVT prophylaxis: SCDs Code Status: Full Family Communication: None at bedside Disposition Plan:  Status is: Inpatient Remains inpatient appropriate because: Need for IV fluids and NG tube placement.  Consultants:  General surgery GI  Procedures:  None  Antimicrobials:  Anti-infectives (From admission, onward)    Start     Dose/Rate Route Frequency Ordered Stop   08/14/23 0000  piperacillin -tazobactam (ZOSYN ) IVPB 3.375 g        3.375 g 12.5 mL/hr over 240 Minutes Intravenous Every 8 hours 08/13/23 2037     08/13/23 1730  piperacillin -tazobactam (ZOSYN ) IVPB 3.375 g        3.375 g 100 mL/hr over 30 Minutes Intravenous  Once 08/13/23 1725 08/13/23 2036       Subjective: Patient seen and evaluated today with improvement in abdominal pain and no nausea or vomiting noted.  Patient denies any flatus or bowel movements.  Objective: Vitals:   08/13/23 2029 08/13/23 2350 08/14/23 0548 08/14/23 0815  BP:  (!) 170/79 (!) 148/75 (!) 140/83  Pulse:  (!) 58 65 69  Resp:  18 20 19   Temp:  97.6 F (36.4 C) 98.3 F (36.8 C) 98.5 F (36.9 C)  TempSrc:  Oral Oral Oral  SpO2: 100% 98% 98% 98%  Weight:      Height:        Intake/Output Summary (Last 24 hours) at 08/14/2023 1158 Last data filed at 08/14/2023 0849 Gross per 24 hour  Intake 700.39 ml  Output 875 ml  Net -174.61 ml  Filed Weights   08/13/23 1516 08/13/23 2003  Weight: 77.1 kg 78.6 kg    Examination:  General exam: Appears calm and comfortable  Respiratory system: Clear to auscultation. Respiratory effort normal. Cardiovascular system: S1 & S2 heard, RRR.  Gastrointestinal system: Abdomen is soft, NG tube to LIS Central nervous system: Alert and awake Extremities: No edema Skin: No  significant lesions noted Psychiatry: Flat affect.    Data Reviewed: I have personally reviewed following labs and imaging studies  CBC: Recent Labs  Lab 08/13/23 1530 08/13/23 2138 08/14/23 0410  WBC 18.2*  --  13.3*  HGB 13.1 12.2* 13.0  HCT 39.5 37.6* 39.7  MCV 89.4  --  90.4  PLT 205  --  188   Basic Metabolic Panel: Recent Labs  Lab 08/13/23 1530 08/14/23 0410  NA 137 140  K 3.8 4.1  CL 103 106  CO2 23 24  GLUCOSE 164* 129*  BUN 20 15  CREATININE 0.91 0.88  CALCIUM 9.7 9.5   GFR: Estimated Creatinine Clearance: 61.6 mL/min (by C-G formula based on SCr of 0.88 mg/dL). Liver Function Tests: Recent Labs  Lab 08/13/23 1530  AST 22  ALT 18  ALKPHOS 55  BILITOT 0.7  PROT 7.0  ALBUMIN 3.7   Recent Labs  Lab 08/13/23 1530  LIPASE 25   No results for input(s): AMMONIA in the last 168 hours. Coagulation Profile: Recent Labs  Lab 08/13/23 1551  INR 1.0   Cardiac Enzymes: No results for input(s): CKTOTAL, CKMB, CKMBINDEX, TROPONINI in the last 168 hours. BNP (last 3 results) No results for input(s): PROBNP in the last 8760 hours. HbA1C: No results for input(s): HGBA1C in the last 72 hours. CBG: No results for input(s): GLUCAP in the last 168 hours. Lipid Profile: No results for input(s): CHOL, HDL, LDLCALC, TRIG, CHOLHDL, LDLDIRECT in the last 72 hours. Thyroid  Function Tests: No results for input(s): TSH, T4TOTAL, FREET4, T3FREE, THYROIDAB in the last 72 hours. Anemia Panel: No results for input(s): VITAMINB12, FOLATE, FERRITIN, TIBC, IRON, RETICCTPCT in the last 72 hours. Sepsis Labs: Recent Labs  Lab 08/13/23 1551 08/13/23 1743  LATICACIDVEN 2.3* 2.1*    Recent Results (from the past 240 hours)  Culture, blood (routine x 2)     Status: None (Preliminary result)   Collection Time: 08/13/23  5:43 PM   Specimen: BLOOD  Result Value Ref Range Status   Specimen Description BLOOD LEFT  ANTECUBITAL  Final   Special Requests   Final    BOTTLES DRAWN AEROBIC AND ANAEROBIC Blood Culture adequate volume   Culture   Final    NO GROWTH < 24 HOURS Performed at [facility], [address] [place], [place] [address]    Report Status PENDING  Incomplete  Culture, blood (routine x 2)     Status: None (Preliminary result)   Collection Time: 08/13/23  5:43 PM   Specimen: BLOOD  Result Value Ref Range Status   Specimen Description BLOOD RIGHT ANTECUBITAL  Final   Special Requests   Final    BOTTLES DRAWN AEROBIC AND ANAEROBIC Blood Culture adequate volume   Culture   Final    NO GROWTH < 24 HOURS Performed at [facility], [address], [place], [place] [address]    Report Status PENDING  Incomplete         Radiology Studies: DG Chest Portable 1 View Result Date: 08/13/2023 CLINICAL DATA:  NG tube placement EXAM: PORTABLE CHEST 1 VIEW COMPARISON:  01/04/2023 FINDINGS: NG tube tip  is near the GE junction. The side port is in the distal esophagus. This could be advanced several cm for optimal positioning in the stomach. Heart is normal size. Visualized lungs clear. No effusions. IMPRESSION: NG tube tip near the GE junction. Recommend advancing several cm further into the stomach. Electronically Signed   By: Franky Crease M.D.   On: 08/13/2023 18:56   CT ABDOMEN PELVIS W CONTRAST Result Date: 08/13/2023 CLINICAL DATA:  Acute generalized abdominal pain, vomiting. EXAM: CT ABDOMEN AND PELVIS WITH CONTRAST TECHNIQUE: Multidetector CT imaging of the abdomen and pelvis was performed using the standard protocol following bolus administration of intravenous contrast. RADIATION DOSE REDUCTION: This exam was performed according to the departmental dose-optimization program which includes automated exposure control, adjustment of the mA and/or kV according to patient size and/or use of iterative reconstruction technique. CONTRAST:  OMNIPAQUE  IOHEXOL  300 MG/ML  SOLN COMPARISON:  December 31, 2022. FINDINGS: Lower chest: No acute abnormality. Hepatobiliary: No focal liver abnormality is seen. No gallstones, gallbladder wall thickening, or biliary dilatation. Pancreas: Unremarkable. No pancreatic ductal dilatation or surrounding inflammatory changes. Spleen: Normal in size without focal abnormality. Adrenals/Urinary Tract: Adrenal glands appear normal. Left renal cysts are noted. No hydronephrosis or renal obstruction is noted. Urinary bladder is unremarkable. Stomach/Bowel: Stomach is unremarkable. The appendix is not clearly visualized. The colon is unremarkable. However, there is severe wall thickening and inflammatory changes seen involving the ileum concerning for possible closed loop obstruction or internal hernia given focal narrowing seen on image number 43 of series 2. This appears to result in dilatation of more proximal small bowel loops. Vascular/Lymphatic: Aortic atherosclerosis. No enlarged abdominal or pelvic lymph nodes. Reproductive: Prostate is unremarkable. Other: Small fat containing periumbilical hernia. Small amount of free fluid or ascites is noted around the liver and spleen. It is also present in the right lower quadrant. Musculoskeletal: No acute or significant osseous findings. IMPRESSION: Dilated bowel loops are noted in the right lower quadrant with severe inflammatory changes concerning for possible closed loop obstruction, small bowel volvulus or internal hernia. Small amount of free fluid or ascites is noted as well. Critical Value/emergent results were called by telephone at the time of interpretation on 08/13/2023 at 5:23 pm to provider [facility] , who verbally acknowledged these results. Electronically Signed   By: Lynwood Landy Raddle M.D.   On: 08/13/2023 17:24        Scheduled Meds:  methylPREDNISolone  (SOLU-MEDROL ) injection  60 mg Intravenous Q24H   pantoprazole  (PROTONIX ) IV  40 mg Intravenous Q12H   Continuous Infusions:  dextrose  5 % and 0.9 % NaCl  with KCl 20 mEq/L     piperacillin -tazobactam (ZOSYN )  IV 3.375 g (08/14/23 0518)     LOS: 1 day    Time spent: 55 minutes    Andrew Blasius D Maree, DO [facility]  If 7PM-7AM, please contact night-coverage [email or web address] 08/14/2023, 11:58 AM

## 2023-08-14 NOTE — Plan of Care (Signed)
   Problem: Clinical Measurements: Goal: Ability to maintain clinical measurements within normal limits will improve Outcome: Progressing

## 2023-08-15 DIAGNOSIS — K56609 Unspecified intestinal obstruction, unspecified as to partial versus complete obstruction: Secondary | ICD-10-CM

## 2023-08-15 DIAGNOSIS — K279 Peptic ulcer, site unspecified, unspecified as acute or chronic, without hemorrhage or perforation: Secondary | ICD-10-CM

## 2023-08-15 DIAGNOSIS — K51912 Ulcerative colitis, unspecified with intestinal obstruction: Principal | ICD-10-CM

## 2023-08-15 LAB — COMPREHENSIVE METABOLIC PANEL WITH GFR
ALT: 16 U/L (ref 0–44)
AST: 17 U/L (ref 15–41)
Albumin: 3.4 g/dL — ABNORMAL LOW (ref 3.5–5.0)
Alkaline Phosphatase: 48 U/L (ref 38–126)
Anion gap: 12 (ref 5–15)
BUN: 18 mg/dL (ref 8–23)
CO2: 26 mmol/L (ref 22–32)
Calcium: 10 mg/dL (ref 8.9–10.3)
Chloride: 105 mmol/L (ref 98–111)
Creatinine, Ser: 0.98 mg/dL (ref 0.61–1.24)
GFR, Estimated: >60 mL/min (ref >60)
Glucose, Bld: 117 mg/dL — ABNORMAL HIGH (ref 70–99)
Potassium: 3.7 mmol/L (ref 3.5–5.1)
Sodium: 143 mmol/L (ref 135–145)
Total Bilirubin: 1 mg/dL (ref 0.0–1.2)
Total Protein: 6.6 g/dL (ref 6.5–8.1)

## 2023-08-15 LAB — LACTIC ACID, PLASMA: Lactic Acid, Venous: 2.2 mmol/L (ref 0.5–1.9)

## 2023-08-15 LAB — CBC
HCT: 40.6 % (ref 39.0–52.0)
Hemoglobin: 13.2 g/dL (ref 13.0–17.0)
MCH: 29.5 pg (ref 26.0–34.0)
MCHC: 32.5 g/dL (ref 30.0–36.0)
MCV: 90.8 fL (ref 80.0–100.0)
Platelets: 193 K/uL (ref 150–400)
RBC: 4.47 MIL/uL (ref 4.22–5.81)
RDW: 13.6 % (ref 11.5–15.5)
WBC: 11.8 K/uL — ABNORMAL HIGH (ref 4.0–10.5)
nRBC: 0 % (ref 0.0–0.2)

## 2023-08-15 LAB — C-REACTIVE PROTEIN: CRP: 4.1 mg/dL — ABNORMAL HIGH (ref <1.0)

## 2023-08-15 LAB — MAGNESIUM: Magnesium: 2.4 mg/dL (ref 1.7–2.4)

## 2023-08-15 LAB — HEPATITIS B SURFACE ANTIGEN: Hepatitis B Surface Ag: NONREACTIVE

## 2023-08-15 MED ORDER — FINASTERIDE 5 MG PO TABS
5.0000 mg | ORAL_TABLET | Freq: Every day | ORAL | Status: DC
Start: 2023-08-15 — End: 2023-08-17
  Administered 2023-08-15 – 2023-08-17 (×3): 5 mg via ORAL
  Filled 2023-08-15 (×3): qty 1

## 2023-08-15 MED ORDER — TAMSULOSIN HCL 0.4 MG PO CAPS
0.4000 mg | ORAL_CAPSULE | Freq: Every day | ORAL | Status: DC
  Administered 2023-08-15 – 2023-08-17 (×3): 0.4 mg via ORAL
  Filled 2023-08-15 (×3): qty 1

## 2023-08-15 MED ORDER — MIRABEGRON ER 25 MG PO TB24
50.0000 mg | ORAL_TABLET | Freq: Every day | ORAL | Status: DC
  Administered 2023-08-15 – 2023-08-17 (×3): 50 mg via ORAL
  Filled 2023-08-15 (×3): qty 2

## 2023-08-15 NOTE — Progress Notes (Signed)
 Subjective: Patient feeling some better today. Wife at bedside, states they walked around the floor this morning. NG tube was removed. He has not had any BMs thus far but is passing gas. Abdomen is feeling much better. Does note he felt a bit full when he did liquid breakfast tray this morning. No nausea or vomiting.   Objective: Vital signs in last 24 hours: Temp:  [97.6 F (36.4 C)-98.2 F (36.8 C)] 97.6 F (36.4 C) (07/31 0453) Pulse Rate:  [62-75] 62 (07/31 0453) Resp:  [18-20] 18 (07/31 0453) BP: (129-142)/(75-79) 139/77 (07/31 0453) SpO2:  [95 %-99 %] 98 % (07/31 0453) Last BM Date : 08/13/23 General:   Alert and oriented, pleasant Head:  Normocephalic and atraumatic. Eyes:  No icterus, sclera clear. Conjuctiva pink.  Mouth:  Without lesions, mucosa pink and moist.  Heart:  S1, S2 present, no murmurs noted.  Lungs: Clear to auscultation bilaterally, without wheezing, rales, or rhonchi.  Abdomen:  Bowel sounds present, soft, non-tender, non-distended. No HSM or hernias noted. No rebound or guarding. No masses appreciated  Neurologic:  Alert and  oriented x4;  grossly normal neurologically. Skin:  Warm and dry, intact without significant lesions.  Cervical Nodes:  No significant cervical adenopathy. Psych:  Alert and cooperative. Normal mood and affect.  Intake/Output from previous day: 07/30 0701 - 07/31 0700 In: 1027.7 [I.V.:822.5; NG/GT:60; IV Piggyback:145.2] Out: 560 [Urine:450; Emesis/NG output:110] Intake/Output this shift: No intake/output data recorded.  Lab Results: Recent Labs    08/13/23 1530 08/13/23 2138 08/14/23 0410 08/15/23 0730  WBC 18.2*  --  13.3* 11.8*  HGB 13.1 12.2* 13.0 13.2  HCT 39.5 37.6* 39.7 40.6  PLT 205  --  188 193   BMET Recent Labs    08/13/23 1530 08/14/23 0410 08/15/23 0730  NA 137 140 143  K 3.8 4.1 3.7  CL 103 106 105  CO2 23 24 26   GLUCOSE 164* 129* 117*  BUN 20 15 18   CREATININE 0.91 0.88 0.98  CALCIUM 9.7 9.5 10.0    LFT Recent Labs    08/13/23 1530 08/15/23 0730  PROT 7.0 6.6  ALBUMIN 3.7 3.4*  AST 22 17  ALT 18 16  ALKPHOS 55 48  BILITOT 0.7 1.0   PT/INR Recent Labs    08/13/23 1551  LABPROT 14.1  INR 1.0    Studies/Results: DG Chest Portable 1 View Result Date: 08/13/2023 CLINICAL DATA:  NG tube placement EXAM: PORTABLE CHEST 1 VIEW COMPARISON:  01/04/2023 FINDINGS: NG tube tip is near the GE junction. The side port is in the distal esophagus. This could be advanced several cm for optimal positioning in the stomach. Heart is normal size. Visualized lungs clear. No effusions. IMPRESSION: NG tube tip near the GE junction. Recommend advancing several cm further into the stomach. Electronically Signed   By: Franky Crease M.D.   On: 08/13/2023 18:56   CT ABDOMEN PELVIS W CONTRAST Result Date: 08/13/2023 CLINICAL DATA:  Acute generalized abdominal pain, vomiting. EXAM: CT ABDOMEN AND PELVIS WITH CONTRAST TECHNIQUE: Multidetector CT imaging of the abdomen and pelvis was performed using the standard protocol following bolus administration of intravenous contrast. RADIATION DOSE REDUCTION: This exam was performed according to the departmental dose-optimization program which includes automated exposure control, adjustment of the mA and/or kV according to patient size and/or use of iterative reconstruction technique. CONTRAST:  OMNIPAQUE  IOHEXOL  300 MG/ML  SOLN COMPARISON:  December 31, 2022. FINDINGS: Lower chest: No acute abnormality. Hepatobiliary: No focal liver abnormality is  seen. No gallstones, gallbladder wall thickening, or biliary dilatation. Pancreas: Unremarkable. No pancreatic ductal dilatation or surrounding inflammatory changes. Spleen: Normal in size without focal abnormality. Adrenals/Urinary Tract: Adrenal glands appear normal. Left renal cysts are noted. No hydronephrosis or renal obstruction is noted. Urinary bladder is unremarkable. Stomach/Bowel: Stomach is unremarkable. The  appendix is not clearly visualized. The colon is unremarkable. However, there is severe wall thickening and inflammatory changes seen involving the ileum concerning for possible closed loop obstruction or internal hernia given focal narrowing seen on image number 43 of series 2. This appears to result in dilatation of more proximal small bowel loops. Vascular/Lymphatic: Aortic atherosclerosis. No enlarged abdominal or pelvic lymph nodes. Reproductive: Prostate is unremarkable. Other: Small fat containing periumbilical hernia. Small amount of free fluid or ascites is noted around the liver and spleen. It is also present in the right lower quadrant. Musculoskeletal: No acute or significant osseous findings. IMPRESSION: Dilated bowel loops are noted in the right lower quadrant with severe inflammatory changes concerning for possible closed loop obstruction, small bowel volvulus or internal hernia. Small amount of free fluid or ascites is noted as well. Critical Value/emergent results were called by telephone at the time of interpretation on 08/13/2023 at 5:23 pm to provider [facility] , who verbally acknowledged these results. Electronically Signed   By: Lynwood Landy Raddle M.D.   On: 08/13/2023 17:24    Assessment: David Mooney is a 83 y.o. 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 Imaging concerning for closed loop small bowel obstruction,  General surgery was consulted along with GI for further evaluation   Small bowel obstruction: -sudden onset of abdominal pain, nausea, vomiting, darker/looser stools but not black -in setting of Ulcerative colitis, maintained on Lialda  -FOBT negative -CT A/P with contrast Dilated bowel loops noted in the right lower quadrant with severe inflammatory changes concerning  for possible closed-loop obstruction, small bowel volvulus or internal hernia, Small amount of free fluid or ascites noted as well -last TCS in 2020/2014 with inflammation only in the cecum -no evidence of small bowel disease previously to raise concern for Crohn's disease though CT findings this admission certainly raises concern for Crohn's given inflammatory changes in small bowel -will need outpatient colonoscopy in 6-8 weeks -WBC 18.2 initially, 13.3, 11.8 today. LA elevated but blood cultures thus far negative  -TB quant and Hep BsAg ordered in preparation for potential biologic therapy as outpatient. Results pending.  -started steroids yesterday due to concern for SBO likely secondary to IBD reactive/inflammatory response vs. Overt infection   -general surgery on board, no surgical intervention at this time -NG tube decompression yesterday, NG tube removed this morning, seems to be improving, passing flatus, no BMs yet. Denies nausea/vomiting/abdominal pain.    Plan: -clear liquid diet -solumedrol 60mg  daily -outpatient colonoscopy in 6-8 weeks  -appreciate General surgery continued input -continue supportive measures -OOB as tolerated     LOS: 2 days    08/15/2023, 8:59 AM   Morrell Fluke L. Tyce Delcid, MSN, APRN, AGNP-C Adult-Gerontology Nurse Practitioner [facility]

## 2023-08-15 NOTE — Progress Notes (Addendum)
 Subjective: Patient reports feeling well this morning and does not have any pain in his stomach. He has been passing gas since last night and has done so again this morning but has not had any bowel movement yet. Tolerating NG tube well. He denies any nausea or vomiting. Urination is going well with no hematuria. Denies any chest pain or SOB. Has not been up to walk around except to try to go to the bathroom. He has no concerns at the moment and no significant overnight events. Would like to discuss clinical situation with physician when wife is here later.  Objective: Vital signs in last 24 hours: Temp:  [97.6 F (36.4 C)-98.5 F (36.9 C)] 97.6 F (36.4 C) (07/31 0453) Pulse Rate:  [62-75] 62 (07/31 0453) Resp:  [18-20] 18 (07/31 0453) BP: (129-142)/(75-83) 139/77 (07/31 0453) SpO2:  [95 %-99 %] 98 % (07/31 0453) Last BM Date : 08/13/23  Intake/Output from previous day: 07/30 0701 - 07/31 0700 In: 1027.7 [I.V.:822.5; NG/GT:60; IV Piggyback:145.2] Out: 560 [Urine:450; Emesis/NG output:110] Intake/Output this shift: No intake/output data recorded.  General appearance: alert, cooperative, and no distress Head: Normocephalic, without obvious abnormality, atraumatic Resp: clear to auscultation bilaterally Cardio: regular rate and rhythm, S1, S2 normal, no murmur, click, rub or gallop GI: abnormal findings:  hyperactive bowel sounds and tenderness to palpation in RLQ and LLQ, 4/10 Extremities: extremities normal, atraumatic, no cyanosis or edema and Homans sign is negative, no sign of DVT Pulses: 2+ and symmetric Skin: Skin color, texture, turgor normal. No rashes or lesions  Lab Results:  Recent Labs    08/13/23 1530 08/13/23 2138 08/14/23 0410  WBC 18.2*  --  13.3*  HGB 13.1 12.2* 13.0  HCT 39.5 37.6* 39.7  PLT 205  --  188   BMET Recent Labs    08/13/23 1530 08/14/23 0410  NA 137 140  K 3.8 4.1  CL 103 106  CO2 23 24  GLUCOSE 164* 129*  BUN 20 15  CREATININE  0.91 0.88  CALCIUM 9.7 9.5   PT/INR Recent Labs    08/13/23 1551  LABPROT 14.1  INR 1.0    Studies/Results: DG Chest Portable 1 View Result Date: 08/13/2023 CLINICAL DATA:  NG tube placement EXAM: PORTABLE CHEST 1 VIEW COMPARISON:  01/04/2023 FINDINGS: NG tube tip is near the GE junction. The side port is in the distal esophagus. This could be advanced several cm for optimal positioning in the stomach. Heart is normal size. Visualized lungs clear. No effusions. IMPRESSION: NG tube tip near the GE junction. Recommend advancing several cm further into the stomach. Electronically Signed   By: Franky Crease M.D.   On: 08/13/2023 18:56   CT ABDOMEN PELVIS W CONTRAST Result Date: 08/13/2023 CLINICAL DATA:  Acute generalized abdominal pain, vomiting. EXAM: CT ABDOMEN AND PELVIS WITH CONTRAST TECHNIQUE: Multidetector CT imaging of the abdomen and pelvis was performed using the standard protocol following bolus administration of intravenous contrast. RADIATION DOSE REDUCTION: This exam was performed according to the departmental dose-optimization program which includes automated exposure control, adjustment of the mA and/or kV according to patient size and/or use of iterative reconstruction technique. CONTRAST:  OMNIPAQUE  IOHEXOL  300 MG/ML  SOLN COMPARISON:  December 31, 2022. FINDINGS: Lower chest: No acute abnormality. Hepatobiliary: No focal liver abnormality is seen. No gallstones, gallbladder wall thickening, or biliary dilatation. Pancreas: Unremarkable. No pancreatic ductal dilatation or surrounding inflammatory changes. Spleen: Normal in size without focal abnormality. Adrenals/Urinary Tract: Adrenal glands appear normal. Left renal cysts  are noted. No hydronephrosis or renal obstruction is noted. Urinary bladder is unremarkable. Stomach/Bowel: Stomach is unremarkable. The appendix is not clearly visualized. The colon is unremarkable. However, there is severe wall thickening and inflammatory  changes seen involving the ileum concerning for possible closed loop obstruction or internal hernia given focal narrowing seen on image number 43 of series 2. This appears to result in dilatation of more proximal small bowel loops. Vascular/Lymphatic: Aortic atherosclerosis. No enlarged abdominal or pelvic lymph nodes. Reproductive: Prostate is unremarkable. Other: Small fat containing periumbilical hernia. Small amount of free fluid or ascites is noted around the liver and spleen. It is also present in the right lower quadrant. Musculoskeletal: No acute or significant osseous findings. IMPRESSION: Dilated bowel loops are noted in the right lower quadrant with severe inflammatory changes concerning for possible closed loop obstruction, small bowel volvulus or internal hernia. Small amount of free fluid or ascites is noted as well. Critical Value/emergent results were called by telephone at the time of interpretation on 08/13/2023 at 5:23 pm to provider [facility] , who verbally acknowledged these results. Electronically Signed   By: Lynwood Landy Raddle M.D.   On: 08/13/2023 17:24    Anti-infectives: Anti-infectives (From admission, onward)    Start     Dose/Rate Route Frequency Ordered Stop   08/14/23 0000  piperacillin -tazobactam (ZOSYN ) IVPB 3.375 g        3.375 g 12.5 mL/hr over 240 Minutes Intravenous Every 8 hours 08/13/23 2037     08/13/23 1730  piperacillin -tazobactam (ZOSYN ) IVPB 3.375 g        3.375 g 100 mL/hr over 30 Minutes Intravenous  Once 08/13/23 1725 08/13/23 2036       Assessment/Plan: Impression: small bowel obstruction, improving mildly  Assessment. David Mooney is feeling much better than he was at admission and isn't in any pain at rest. He has had several instances of passing flatus but has not been able to have a BM yet. He has not been up to ambulate except to try to go to the bathroom. No signs of DVT or PE.  Plan:  Ambulate through hallways as tolerated to improve  bowel motility and for DVT prophylaxis. Remove NG Tube  Advancing to full liquid diet as tolerated Continue solumedrol and protonix    LOS: 2 days    David Mooney 08/15/2023

## 2023-08-15 NOTE — Progress Notes (Signed)
 PROGRESS NOTE    David Mooney  FMW:[id number] DOB: 08/13/1940 DOA: 08/13/2023 PCP: Lari Elspeth BRAVO, MD   Brief Narrative:    David Mooney is a 83 y.o. male with medical history significant for DVT, BPH, duodenal ulcer, ulcerative colitis. Patient presented to the ED with complaints of sudden onset of abdominal pain that started today, with abdominal bloating, he reports at least 4 episodes of vomiting-contents were black initially and then dark green, also reports black stools.  Patient was admitted with small bowel obstruction and has had NG tube placement.    7/30: General surgery evaluation not recommending intervention at this time and has suggested GI evaluation for enteritis likely secondary to inflammatory bowel disease.  GI has started patient on IV Solu-Medrol .  7/31: Overall appears to be doing better with NG tube removed and started on full liquid diet with plans for mobilization.  Assessment & Plan:   Principal Problem:   SBO (small bowel obstruction) (HCC) Active Problems:   Ulcerative colitis (HCC)   Benign prostatic hyperplasia with urinary obstruction   Right sided abdominal pain   Enteritis  Assessment and Plan:  SBO (small bowel obstruction) (HCC) with concern for enteritis-improving Presenting with abdominal pain, vomiting and diarrhea.  Reports black vomitus and black stools.  Stool occult negative.  Leukocytosis of 18.2.  Lactic acid of 2.3 > 2.1.  His eliquis was d/c 7/19.  History of ulcerative colitis, GI ulcers. - CTAP WC- dilated bowel loops are noted in the right lower quadrant with severe inflammatory changes concerning for possible closed loop obstruction, small bowel volvulus or internal hernia.  -Appreciate general surgery and GI recommendations with Solu-Medrol  initiated - Stop IV fluid and Zosyn  -Advanced to full liquid diet with NG tube removed and plans for ambulation.   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 His anticoagulation with Eliquis was discontinued 7/19.  DVT prophylaxis: SCDs Code Status: Full Family Communication: Spouse at bedside 7/31  Disposition Plan:  Status is: Inpatient Remains inpatient appropriate because: Need for IV fluids and NG tube placement.  Consultants:  General surgery GI  Procedures:  None  Antimicrobials:  Anti-infectives (From admission, onward)    Start     Dose/Rate Route Frequency Ordered Stop   08/14/23 0000  piperacillin -tazobactam (ZOSYN ) IVPB 3.375 g        3.375 g 12.5 mL/hr over 240 Minutes Intravenous Every 8 hours 08/13/23 2037     08/13/23 1730  piperacillin -tazobactam (ZOSYN ) IVPB 3.375 g        3.375 g 100 mL/hr over 30 Minutes Intravenous  Once 08/13/23 1725 08/13/23 2036       Subjective: Patient seen and evaluated today and overall appears to be doing much better with no further abdominal pain, nausea, or vomiting.  NG tube has been removed and he has been advanced to full liquid diet per general surgery with plans for mobilization.  Objective: Vitals:   08/14/23 0815 08/14/23 1334 08/14/23 2101 08/15/23 0453  BP: (!) 140/83 (!) 142/79 129/75 139/77  Pulse: 69 74 75 62  Resp: 19 18 20 18   Temp: 98.5 F (36.9 C) 97.8 F (36.6 C) 98.2 F (36.8 C) 97.6 F (36.4 C)  TempSrc: Oral Oral Oral Oral  SpO2: 98% 99% 95% 98%  Weight:      Height:        Intake/Output Summary (Last 24 hours) at 08/15/2023 1117 Last data filed at 08/15/2023 0900 Gross per 24 hour  Intake 997.71 ml  Output 760 ml  Net 237.71 ml   Filed Weights   08/13/23 1516 08/13/23 2003  Weight: 77.1 kg  78.6 kg    Examination:  General exam: Appears calm and comfortable  Respiratory system: Clear to auscultation. Respiratory effort normal. Cardiovascular system: S1 & S2 heard, RRR.  Gastrointestinal system: Abdomen is soft Central nervous system: Alert and awake Extremities: No edema Skin: No significant lesions noted Psychiatry: Flat affect.    Data Reviewed: I have personally reviewed following labs and imaging studies  CBC: Recent Labs  Lab 08/13/23 1530 08/13/23 2138 08/14/23 0410 08/15/23 0730  WBC 18.2*  --  13.3* 11.8*  HGB 13.1 12.2* 13.0 13.2  HCT 39.5 37.6* 39.7 40.6  MCV 89.4  --  90.4 90.8  PLT 205  --  188 193   Basic Metabolic Panel: Recent Labs  Lab 08/13/23 1530 08/14/23 0410 08/15/23 0730  NA 137 140 143  K 3.8 4.1 3.7  CL 103 106 105  CO2 23 24 26   GLUCOSE 164* 129* 117*  BUN 20 15 18   CREATININE 0.91 0.88 0.98  CALCIUM 9.7 9.5 10.0  MG  --   --  2.4   GFR: Estimated Creatinine Clearance: 55.3 mL/min (by C-G formula based on SCr of 0.98 mg/dL). Liver Function Tests: Recent Labs  Lab 08/13/23 1530 08/15/23 0730  AST 22 17  ALT 18 16  ALKPHOS 55 48  BILITOT 0.7 1.0  PROT 7.0 6.6  ALBUMIN 3.7 3.4*   Recent Labs  Lab 08/13/23 1530  LIPASE 25   No results for input(s): AMMONIA in the last 168 hours. Coagulation Profile: Recent Labs  Lab 08/13/23 1551  INR 1.0   Cardiac Enzymes: No results for input(s): CKTOTAL, CKMB, CKMBINDEX, TROPONINI in the last 168 hours. BNP (last 3 results) No results for input(s): PROBNP in the last 8760 hours. HbA1C: No results for input(s): HGBA1C in the last 72 hours. CBG: No results for input(s): GLUCAP in the last 168 hours. Lipid Profile: No results for input(s): CHOL, HDL, LDLCALC, TRIG, CHOLHDL, LDLDIRECT in the last 72 hours. Thyroid  Function Tests: No results for input(s): TSH, T4TOTAL, FREET4, T3FREE, THYROIDAB in the last 72 hours. Anemia  Panel: No results for input(s): VITAMINB12, FOLATE, FERRITIN, TIBC, IRON, RETICCTPCT in the last 72 hours. Sepsis Labs: Recent Labs  Lab 08/13/23 1551 08/13/23 1743 08/15/23 0733  LATICACIDVEN 2.3* 2.1* 2.2*    Recent Results (from the past 240 hours)  Culture, blood (routine x 2)     Status: None (Preliminary result)   Collection Time: 08/13/23  5:43 PM   Specimen: BLOOD  Result Value Ref Range Status   Specimen Description BLOOD LEFT ANTECUBITAL  Final   Special Requests   Final    BOTTLES DRAWN AEROBIC AND ANAEROBIC Blood Culture adequate volume   Culture   Final    NO GROWTH 2 DAYS Performed at [facility], [address], [place], [place] [address]    Report Status PENDING  Incomplete  Culture, blood (routine x 2)     Status: None (Preliminary result)   Collection Time: 08/13/23  5:43 PM   Specimen: BLOOD  Result Value Ref Range Status   Specimen Description BLOOD RIGHT ANTECUBITAL  Final   Special Requests   Final    BOTTLES DRAWN AEROBIC AND ANAEROBIC Blood Culture adequate volume   Culture   Final    NO GROWTH 2 DAYS Performed at [facility], [address], [place], [place] [address]    Report Status PENDING  Incomplete         Radiology Studies: DG Chest Portable 1 View Result Date: 08/13/2023 CLINICAL DATA:  NG tube placement EXAM: PORTABLE CHEST 1 VIEW COMPARISON:  01/04/2023 FINDINGS: NG tube tip is near the GE junction. The side port is in the distal esophagus. This could be advanced several cm for optimal positioning in the stomach. Heart is normal size. Visualized lungs clear. No effusions. IMPRESSION: NG tube tip near the GE junction. Recommend advancing several cm further into the stomach. Electronically Signed   By: Franky Crease M.D.   On: 08/13/2023 18:56   CT ABDOMEN PELVIS W CONTRAST Result Date: 08/13/2023 CLINICAL DATA:  Acute generalized abdominal pain, vomiting. EXAM: CT ABDOMEN AND PELVIS WITH CONTRAST TECHNIQUE: Multidetector CT  imaging of the abdomen and pelvis was performed using the standard protocol following bolus administration of intravenous contrast. RADIATION DOSE REDUCTION: This exam was performed according to the departmental dose-optimization program which includes automated exposure control, adjustment of the mA and/or kV according to patient size and/or use of iterative reconstruction technique. CONTRAST:  OMNIPAQUE  IOHEXOL  300 MG/ML  SOLN COMPARISON:  December 31, 2022. FINDINGS: Lower chest: No acute abnormality. Hepatobiliary: No focal liver abnormality is seen. No gallstones, gallbladder wall thickening, or biliary dilatation. Pancreas: Unremarkable. No pancreatic ductal dilatation or surrounding inflammatory changes. Spleen: Normal in size without focal abnormality. Adrenals/Urinary Tract: Adrenal glands appear normal. Left renal cysts are noted. No hydronephrosis or renal obstruction is noted. Urinary bladder is unremarkable. Stomach/Bowel: Stomach is unremarkable. The appendix is not clearly visualized. The colon is unremarkable. However, there is severe wall thickening and inflammatory changes seen involving the ileum concerning for possible closed loop obstruction or internal hernia given focal narrowing seen on image number 43 of series 2. This appears to result in dilatation of more proximal small bowel loops. Vascular/Lymphatic: Aortic atherosclerosis. No enlarged abdominal or pelvic lymph nodes. Reproductive: Prostate is unremarkable. Other: Small fat containing periumbilical hernia. Small amount of free fluid or ascites is noted around the liver and spleen. It is also present in the right lower quadrant. Musculoskeletal: No acute or significant osseous findings. IMPRESSION: Dilated bowel loops are noted in the right lower quadrant with severe inflammatory changes concerning for possible closed loop obstruction, small bowel volvulus or internal hernia. Small amount of free fluid or ascites is noted as well.  Critical Value/emergent results were called by telephone at the time of interpretation on 08/13/2023 at 5:23 pm to provider [facility] , who verbally acknowledged these results. Electronically Signed   By: Lynwood Landy Raddle M.D.   On: 08/13/2023 17:24        Scheduled Meds:  methylPREDNISolone  (SOLU-MEDROL ) injection  60 mg Intravenous Q24H   pantoprazole  (PROTONIX ) IV  40 mg Intravenous Q12H   Continuous Infusions:  piperacillin -tazobactam (ZOSYN )  IV 3.375 g (08/15/23 0517)     LOS: 2 days    Time spent: 55 minutes    Burt Piatek D Maree, DO [facility]  If 7PM-7AM, please contact night-coverage [email or web address] 08/15/2023, 11:17 AM

## 2023-08-15 NOTE — Plan of Care (Signed)

## 2023-08-15 NOTE — Progress Notes (Signed)
 MD Mavis in to see pt. NGT removed by MD. Pt instructed on full liquid diet and need to advance slowly with eating/drinking. Also instructed on importance of ambulation and mobility. Pt states understanding.

## 2023-08-16 DIAGNOSIS — D72829 Elevated white blood cell count, unspecified: Secondary | ICD-10-CM

## 2023-08-16 DIAGNOSIS — K56609 Unspecified intestinal obstruction, unspecified as to partial versus complete obstruction: Secondary | ICD-10-CM | POA: Diagnosis not present

## 2023-08-16 DIAGNOSIS — R710 Precipitous drop in hematocrit: Secondary | ICD-10-CM

## 2023-08-16 DIAGNOSIS — R7982 Elevated C-reactive protein (CRP): Secondary | ICD-10-CM

## 2023-08-16 LAB — BASIC METABOLIC PANEL WITH GFR
Anion gap: 6 (ref 5–15)
BUN: 21 mg/dL (ref 8–23)
CO2: 25 mmol/L (ref 22–32)
Calcium: 9.3 mg/dL (ref 8.9–10.3)
Chloride: 107 mmol/L (ref 98–111)
Creatinine, Ser: 0.99 mg/dL (ref 0.61–1.24)
GFR, Estimated: >60 mL/min (ref >60)
Glucose, Bld: 121 mg/dL — ABNORMAL HIGH (ref 70–99)
Potassium: 3.6 mmol/L (ref 3.5–5.1)
Sodium: 138 mmol/L (ref 135–145)

## 2023-08-16 LAB — CBC
HCT: 35 % — ABNORMAL LOW (ref 39.0–52.0)
Hemoglobin: 11.6 g/dL — ABNORMAL LOW (ref 13.0–17.0)
MCH: 29.4 pg (ref 26.0–34.0)
MCHC: 33.1 g/dL (ref 30.0–36.0)
MCV: 88.6 fL (ref 80.0–100.0)
Platelets: 181 K/uL (ref 150–400)
RBC: 3.95 MIL/uL — ABNORMAL LOW (ref 4.22–5.81)
RDW: 13.3 % (ref 11.5–15.5)
WBC: 12.8 K/uL — ABNORMAL HIGH (ref 4.0–10.5)
nRBC: 0 % (ref 0.0–0.2)

## 2023-08-16 LAB — MAGNESIUM: Magnesium: 2.2 mg/dL (ref 1.7–2.4)

## 2023-08-16 NOTE — TOC Progression Note (Signed)
 Transition of Care [facility]) - Progression Note    Patient Details  Name: David Mooney MRN: [id number] Date of Birth: Jan 19, 1940  [facility]) CM/SW Contact  Hoy DELENA Bigness, LCSW Phone Number: 08/16/2023, 2:59 PM  Clinical Narrative:     Pt agreeable to recommendation for OPPT. OPPT referral has been placed.     Barriers to Discharge: Continued Medical Work up               Expected Discharge Plan and Services                                               Social Drivers of Health (SDOH) Interventions SDOH Screenings   Food Insecurity: No Food Insecurity (08/13/2023)  Housing: Low Risk  (08/13/2023)  Transportation Needs: No Transportation Needs (08/13/2023)  Utilities: Not At Risk (08/13/2023)  Social Connections: Socially Integrated (08/13/2023)  Tobacco Use: Low Risk  (08/13/2023)    Readmission Risk Interventions    08/14/2023    9:18 AM  Readmission Risk Prevention Plan  Medication Screening Complete  Transportation Screening Complete

## 2023-08-16 NOTE — Plan of Care (Signed)
   Problem: Education: Goal: Knowledge of General Education information will improve Description: Including pain rating scale, medication(s)/side effects and non-pharmacologic comfort measures Outcome: Progressing   Problem: Clinical Measurements: Goal: Ability to maintain clinical measurements within normal limits will improve Outcome: Progressing Goal: Diagnostic test results will improve Outcome: Progressing

## 2023-08-16 NOTE — Evaluation (Signed)
 Physical Therapy Evaluation Patient Details Name: David Mooney MRN: [id number] DOB: [birth year] Today's Date: 08/16/2023  History of Present Illness  David Mooney is a 83 y.o. male with medical history significant for DVT, BPH, duodenal ulcer, ulcerative colitis.  Patient presented to the ED with complaints of sudden onset of abdominal pain that started today, with abdominal bloating, he reports at least 4 episodes of vomiting-contents were black initially and then dark green, also reports black stools.  No fevers no chills.  He was previously on anticoagulation with Eliquis for DVT, but this was discontinued on the 19th of this month- his provider told him he no longer needed it.   Clinical Impression  Patient tolerated OT/PT co-evaluation well. Patient was modified Independent - Independent with all bed mobility, functional transfers (STS, commode), and ambulation without AD. Patient demonstrates some mild LE weakness via MMT (4-/5 hip flexion) and while standing from commode, requiring second attempt to complete independently. Also demonstrated balance deficits during independent ambulation, specifically during turns, which patient would benefit from skilled PT in outpatient setting to address. Patient does not present with urgent need for skilled physical therapy acutely at this time. Patient discharged to care of nursing for ambulation daily as tolerated for length of stay.       If plan is discharge home, recommend the following:     Can travel by private vehicle        Equipment Recommendations None recommended by PT  Recommendations for Other Services       Functional Status Assessment Patient has had a recent decline in their functional status and demonstrates the ability to make significant improvements in function in a reasonable and predictable amount of time.     Precautions / Restrictions Precautions Precautions: Fall Recall of Precautions/Restrictions:  Intact Restrictions Weight Bearing Restrictions Per Provider Order: No      Mobility  Bed Mobility Overal bed mobility: Independent, Modified Independent       General bed mobility comments: HOB Flat, slight use of railings during supine>sit    Transfers Overall transfer level: Independent       General transfer comment: without AD    Ambulation/Gait Ambulation/Gait assistance: Independent Gait Distance (Feet): 100 Feet Assistive device: None Gait Pattern/deviations: Step-through pattern, Staggering right Gait velocity: WFL     General Gait Details: Pt with 2 instances of slight unsteadiness,  losing balance in R direction with initial stand and during turns but able to recover independently. repots 2-3 falls within last 6 months  Stairs      Wheelchair Mobility     Tilt Bed    Modified Rankin (Stroke Patients Only)       Balance Overall balance assessment: Mild deficits observed, not formally tested           Pertinent Vitals/Pain Pain Assessment Pain Assessment: 0-10 Pain Score: 4  Pain Location: gut Pain Descriptors / Indicators: Throbbing Pain Intervention(s): Limited activity within patient's tolerance, Monitored during session, Repositioned    Home Living Family/patient expects to be discharged to:: Private residence Living Arrangements: Spouse/significant other Available Help at Discharge: Family;Available 24 hours/day Type of Home: House Home Access: Stairs to enter   [employer] of Steps: 2   Home Layout: One level Home Equipment: Agricultural consultant (2 wheels) Additional Comments: Reports wife unable to assist him physically but available 24/7. daughter lives close by    Prior Function Prior Level of Function : Independent/Modified Independent       Mobility Comments: Tourist information centre manager  without AD ADLs Comments: Independent     Extremity/Trunk Assessment   Upper Extremity Assessment Upper Extremity Assessment:  Defer to OT evaluation    Lower Extremity Assessment Lower Extremity Assessment: Generalized weakness;Overall [facility] for tasks assessed    Cervical / Trunk Assessment Cervical / Trunk Assessment: Normal  Communication   Communication Communication: No apparent difficulties    Cognition Arousal: Alert Behavior During Therapy: WFL for tasks assessed/performed   PT - Cognitive impairments: No apparent impairments         Following commands: Intact       Cueing Cueing Techniques: Verbal cues     General Comments      Exercises     Assessment/Plan    PT Assessment All further PT needs can be met in the next venue of care  PT Problem List Decreased strength;Decreased balance;Decreased activity tolerance       PT Treatment Interventions      PT Goals (Current goals can be found in the Care Plan section)  Acute Rehab PT Goals Patient Stated Goal: Return home with option of Outpatient PT for balance PT Goal Formulation: With patient Time For Goal Achievement: 08/18/23 Potential to Achieve Goals: Good    Frequency       Co-evaluation PT/OT/SLP Co-Evaluation/Treatment: Yes Reason for Co-Treatment: To address functional/ADL transfers PT goals addressed during session: Mobility/safety with mobility OT goals addressed during session: ADL's and self-care       AM-PAC PT 6 Clicks Mobility  Outcome Measure Help needed turning from your back to your side while in a flat bed without using bedrails?: None Help needed moving from lying on your back to sitting on the side of a flat bed without using bedrails?: None Help needed moving to and from a bed to a chair (including a wheelchair)?: None Help needed standing up from a chair using your arms (e.g., wheelchair or bedside chair)?: None Help needed to walk in hospital room?: A Little Help needed climbing 3-5 steps with a railing? : A Little 6 Click Score: 22    End of Session   Activity Tolerance: Patient tolerated  treatment well Patient left: in chair;with call bell/phone within reach   PT Visit Diagnosis: Unsteadiness on feet (R26.81);Muscle weakness (generalized) (M62.81);History of falling (Z91.81)    Time: 1320-1330 PT Time Calculation (min) (ACUTE ONLY): 10 min   Charges:   PT Evaluation $PT Eval Low Complexity: 1 Low PT Treatments $Therapeutic Activity: 8-22 mins PT General Charges $$ ACUTE PT VISIT: 1 Visit        2:36 PM, 08/16/23 Carley Strickling Powell-Butler, PT, DPT [place] with [facility]

## 2023-08-16 NOTE — Progress Notes (Signed)
  Subjective: Patient reports having some new pain this morning in his right lower quadrant. He was able to have a liquid bowel movement yesterday and again this morning. He feels his pain is an 8-9/10 and is throbbing in quality and does not radiate. It came on after his bowel movement this morning. He does report sleeping well overnight and was able to tolerate his diet yesterday. He denies any nausea or vomiting or blood in his stool.   Objective: Vital signs in last 24 hours: Temp:  [97.3 F (36.3 C)-99.3 F (37.4 C)] 99.3 F (37.4 C) (08/01 0552) Pulse Rate:  [63-99] 63 (08/01 0552) Resp:  [16-18] 16 (08/01 0552) BP: (102-158)/(60-81) 124/60 (08/01 0552) SpO2:  [92 %-100 %] 92 % (08/01 0552) Last BM Date : 08/13/23  Intake/Output from previous day: 07/31 0701 - 08/01 0700 In: -  Out: 200 [Emesis/NG output:200] Intake/Output this shift: No intake/output data recorded.  General appearance: alert, cooperative, and moderate distress Head: Normocephalic, without obvious abnormality, atraumatic Resp: clear to auscultation bilaterally Cardio: regular rate and rhythm, S1, S2 normal, no murmur, click, rub or gallop GI: tender to palpation in RLQ no rebound or guarding, mild distension Extremities: extremities normal, atraumatic, no cyanosis or edema  Lab Results:  Recent Labs    08/15/23 0730 08/16/23 0439  WBC 11.8* 12.8*  HGB 13.2 11.6*  HCT 40.6 35.0*  PLT 193 181   BMET Recent Labs    08/15/23 0730 08/16/23 0439  NA 143 138  K 3.7 3.6  CL 105 107  CO2 26 25  GLUCOSE 117* 121*  BUN 18 21  CREATININE 0.98 0.99  CALCIUM 10.0 9.3   PT/INR Recent Labs    08/13/23 1551  LABPROT 14.1  INR 1.0    Studies/Results: No results found.  Anti-infectives: Anti-infectives (From admission, onward)    Start     Dose/Rate Route Frequency Ordered Stop   08/14/23 0000  piperacillin -tazobactam (ZOSYN ) IVPB 3.375 g  Status:  Discontinued        3.375 g 12.5 mL/hr over  240 Minutes Intravenous Every 8 hours 08/13/23 2037 08/15/23 1119   08/13/23 1730  piperacillin -tazobactam (ZOSYN ) IVPB 3.375 g        3.375 g 100 mL/hr over 30 Minutes Intravenous  Once 08/13/23 1725 08/13/23 2036       Assessment/Plan:  Impression: partial small bowel obstruction- stable  Assessment: Patient is tolerating his diet well and has begun to pass liquid stools but is hurting in his right lower quadrant. I think this could be because of the reintroduction of foods causing more pressure on his previously obstructed area.   Plan: I think we can try some IV Tylenol  for now and keep up with the liquid diet. Consider toradol for pain management as well. Consider repeat imaging if emesis returns.    LOS: 3 days    Leonor JAYSON Clause 08/16/2023

## 2023-08-16 NOTE — Evaluation (Signed)
 Occupational Therapy Evaluation Patient Details Name: David Mooney MRN: [id number] DOB: 27-Sep-1940 Today's Date: 08/16/2023   History of Present Illness   David Mooney is a 83 y.o. male with medical history significant for DVT, BPH, duodenal ulcer, ulcerative colitis.  Patient presented to the ED with complaints of sudden onset of abdominal pain that started today, with abdominal bloating, he reports at least 4 episodes of vomiting-contents were black initially and then dark green, also reports black stools.  No fevers no chills.  He was previously on anticoagulation with Eliquis for DVT, but this was discontinued on the 19th of this month- his provider told him he no longer needed it. (per MD)     Clinical Impressions Pt agreeable to OT and PT co-evaluation. Pt apperas to be near baseline function for ADL's at a level of independent. Brief loss of balance while turning around while ambulating in the hall. No other deficits noted. Pt is not recommended for further acute OT services and will be discharged to care of nursing staff for remaining length of stay.                Functional Status Assessment   Patient has not had a recent decline in their functional status       Precautions/Restrictions   Precautions Precautions: Fall Recall of Precautions/Restrictions: Intact Restrictions Weight Bearing Restrictions Per Provider Order: No     Mobility Bed Mobility Overal bed mobility: Independent                  Transfers Overall transfer level: Independent                        Balance Overall balance assessment: Mild deficits observed, not formally tested                                         ADL either performed or assessed with clinical judgement   ADL Overall ADL's : Independent                                       General ADL Comments: Supervision assist breifly for ambulation due to near loss of  balance once.     Vision Baseline Vision/History: 1 Wears glasses Ability to See in Adequate Light: 1 Impaired Patient Visual Report: No change from baseline Vision Assessment?: No apparent visual deficits     Perception Perception: Not tested       Praxis Praxis: Not tested       Pertinent Vitals/Pain Pain Assessment Pain Assessment: 0-10 Pain Score: 4  Pain Location: gut Pain Descriptors / Indicators: Throbbing Pain Intervention(s): Monitored during session     Extremity/Trunk Assessment Upper Extremity Assessment Upper Extremity Assessment: Overall WFL for tasks assessed   Lower Extremity Assessment Lower Extremity Assessment: Defer to PT evaluation   Cervical / Trunk Assessment Cervical / Trunk Assessment: Normal   Communication Communication Communication: No apparent difficulties   Cognition Arousal: Alert Behavior During Therapy: WFL for tasks assessed/performed Cognition: No apparent impairments                               Following commands: Intact       Cueing  General Comments  Cueing Techniques: Verbal cues      Exercises     Shoulder Instructions      Home Living Family/patient expects to be discharged to:: Private residence Living Arrangements: Spouse/significant other Available Help at Discharge: Family;Available 24 hours/day Type of Home: House Home Access: Stairs to enter [employer] of Steps: 2   Home Layout: One level     Bathroom Shower/Tub: Producer, television/film/video: Standard Bathroom Accessibility: Yes How Accessible: Accessible via wheelchair;Accessible via walker Home Equipment: Rolling Walker (2 wheels)   Additional Comments: Daughter lives a block away. Wife has her own medical issues.      Prior Functioning/Environment Prior Level of Function : Independent/Modified Independent             Mobility Comments: Tourist information centre manager without AD ADLs Comments: Independent                             Co-evaluation PT/OT/SLP Co-Evaluation/Treatment: Yes Reason for Co-Treatment: To address functional/ADL transfers   OT goals addressed during session: ADL's and self-care                       End of Session    Activity Tolerance: Patient tolerated treatment well Patient left: in chair;with call bell/phone within reach  OT Visit Diagnosis: Unsteadiness on feet (R26.81)                Time: 0120-0130 OT Time Calculation (min): 10 min Charges:  OT General Charges $OT Visit: 1 Visit OT Evaluation $OT Eval Low Complexity: 1 Low  Cherrell Maybee OT, MOT  Jayson Person 08/16/2023, 1:54 PM

## 2023-08-16 NOTE — Care Management Important Message (Signed)
 Important Message  Patient Details  Name: David Mooney MRN: [id number] Date of Birth: March 27, 1940   Important Message Given:  Yes - Medicare IM     Kimber Fritts L Brier Reid 08/16/2023, 3:16 PM

## 2023-08-16 NOTE — Plan of Care (Signed)

## 2023-08-16 NOTE — Plan of Care (Signed)
   Problem: Education: Goal: Knowledge of General Education information will improve Description Including pain rating scale, medication(s)/side effects and non-pharmacologic comfort measures Outcome: Progressing   Problem: Health Behavior/Discharge Planning: Goal: Ability to manage health-related needs will improve Outcome: Progressing

## 2023-08-16 NOTE — Progress Notes (Signed)
 Gastroenterology Progress Note   Referring Provider: No ref. provider found Primary Care Physician:  Lari Elspeth BRAVO, MD Primary Gastroenterologist:  Toribio Rubins, MD  Patient ID: David Mooney; [id number]; 1940/12/27    Subjective   During examination patient sitting up on side of bed with lunch tray in front of him.  He stated he was finished with this and did well overall with drinking the broth, eating some Jell-O, and drinking some of his liquids.  Given a little bit of extra pain during meal he decided to slow down.  Does report gassiness.  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  Objective   Vital signs in last 24 hours Temp:  [97.3 F (36.3 C)-99.3 F (37.4 C)] 99.3 F (37.4 C) (08/01 0552) Pulse Rate:  [63-99] 63 (08/01 0552) Resp:  [16-18] 16 (08/01 0552) BP: (102-158)/(60-81) 124/60 (08/01 0552) SpO2:  [92 %-100 %] 92 % (08/01 0552) Last BM Date : 08/13/23  Physical Exam General:   Alert and oriented, pleasant Head:  Normocephalic and atraumatic. Eyes:  No icterus, sclera clear. Conjuctiva pink.  Neck:  Supple, without thyromegaly or masses.  Abdomen:  Bowel sounds present, soft, non-distended. Mild ttp to RLQ. No HSM or hernias noted. No rebound or guarding. No masses appreciated  Extremities:  Without clubbing or edema. Neurologic:  Alert and  oriented x4;  grossly normal neurologically. Skin:  Warm and dry, intact. Scattered bruising.  Psych:  Alert and cooperative. Normal mood and affect.  Intake/Output from previous day: 07/31 0701 - 08/01 0700 In: -  Out: 200 [Emesis/NG output:200] Intake/Output this shift: No intake/output data recorded.  Lab Results  Recent Labs    08/14/23 0410 08/15/23 0730 08/16/23 0439  WBC 13.3* 11.8* 12.8*  HGB 13.0 13.2 11.6*  HCT 39.7 40.6 35.0*  PLT 188 193 181   BMET Recent Labs    08/14/23 0410 08/15/23 0730 08/16/23 0439  NA 140 143 138  K 4.1 3.7 3.6  CL 106 105  107  CO2 24 26 25   GLUCOSE 129* 117* 121*  BUN 15 18 21   CREATININE 0.88 0.98 0.99  CALCIUM 9.5 10.0 9.3   LFT Recent Labs    08/13/23 1530 08/15/23 0730  PROT 7.0 6.6  ALBUMIN 3.7 3.4*  AST 22 17  ALT 18 16  ALKPHOS 55 48  BILITOT 0.7 1.0   PT/INR Recent Labs    08/13/23 1551  LABPROT 14.1  INR 1.0   Hepatitis Panel Recent Labs    08/15/23 0730  HEPBSAG NON REACTIVE    Studies/Results DG Chest Portable 1 View Result Date: 08/13/2023 CLINICAL DATA:  NG tube placement EXAM: PORTABLE CHEST 1 VIEW COMPARISON:  01/04/2023 FINDINGS: NG tube tip is near the GE junction. The side port is in the distal esophagus. This could be advanced several cm for optimal positioning in the stomach. Heart is normal size. Visualized lungs clear. No effusions. IMPRESSION: NG tube tip near the GE junction. Recommend advancing several cm further into the stomach. Electronically Signed   By: Franky Crease M.D.   On: 08/13/2023 18:56   CT ABDOMEN PELVIS W CONTRAST Result Date: 08/13/2023 CLINICAL DATA:  Acute generalized abdominal pain, vomiting. EXAM: CT ABDOMEN AND PELVIS WITH CONTRAST TECHNIQUE: Multidetector CT imaging of the abdomen and pelvis was performed using the standard protocol following bolus administration of intravenous contrast. RADIATION DOSE REDUCTION: This exam was performed according to the departmental dose-optimization program which includes automated exposure  control, adjustment of the mA and/or kV according to patient size and/or use of iterative reconstruction technique. CONTRAST:  OMNIPAQUE  IOHEXOL  300 MG/ML  SOLN COMPARISON:  December 31, 2022. FINDINGS: Lower chest: No acute abnormality. Hepatobiliary: No focal liver abnormality is seen. No gallstones, gallbladder wall thickening, or biliary dilatation. Pancreas: Unremarkable. No pancreatic ductal dilatation or surrounding inflammatory changes. Spleen: Normal in size without focal abnormality. Adrenals/Urinary Tract:  Adrenal glands appear normal. Left renal cysts are noted. No hydronephrosis or renal obstruction is noted. Urinary bladder is unremarkable. Stomach/Bowel: Stomach is unremarkable. The appendix is not clearly visualized. The colon is unremarkable. However, there is severe wall thickening and inflammatory changes seen involving the ileum concerning for possible closed loop obstruction or internal hernia given focal narrowing seen on image number 43 of series 2. This appears to result in dilatation of more proximal small bowel loops. Vascular/Lymphatic: Aortic atherosclerosis. No enlarged abdominal or pelvic lymph nodes. Reproductive: Prostate is unremarkable. Other: Small fat containing periumbilical hernia. Small amount of free fluid or ascites is noted around the liver and spleen. It is also present in the right lower quadrant. Musculoskeletal: No acute or significant osseous findings. IMPRESSION: Dilated bowel loops are noted in the right lower quadrant with severe inflammatory changes concerning for possible closed loop obstruction, small bowel volvulus or internal hernia. Small amount of free fluid or ascites is noted as well. Critical Value/emergent results were called by telephone at the time of interpretation on 08/13/2023 at 5:23 pm to provider [facility] , who verbally acknowledged these results. Electronically Signed   By: Lynwood Landy Raddle M.D.   On: 08/13/2023 17:24   DG Abd 1 View Result Date: 07/22/2023 CLINICAL DATA:  History of kidney stones. EXAM: ABDOMEN - 1 VIEW COMPARISON:  01/23/2023 FINDINGS: No visualized urolithiasis. Calcification in the pelvis are unchanged in typical of phleboliths. Normal bowel gas. Small volume of formed stool in the colon. Mild scoliosis and degenerative change in the spine. IMPRESSION: No visualized urolithiasis. Electronically Signed   By: Andrea Gasman M.D.   On: 07/22/2023 20:15    Assessment  83 y.o. 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  General surgery and GI consulted (7/31) for further evaluation given imaging concerns for closed-loop small bowel obstruction.  Small bowel obstruction: - Symptoms began with sudden onset abdominal pain, nausea, vomiting, loose stools - Significant history of UC, maintained on Lialda  - FOBT negative - CT imaging this admission with dilated bowel loops in the right lower quadrant with severe inflammatory changes concerning for possible closed-loop obstruction, small bowel volvulus or internal hernia, small amount of free fluid or ascites noted as well - Last colonoscopy in 2020 and 2014 with inflammation only in the cecum - Previously no evidence of small bowel disease to raise concern for Crohn's though imaging this admission raising concern for possible Crohn's given small bowel inflammation - TB quant to [place] and hep B surface antigen ordered in preparation for potential biologic therapy -hep BsAg negative, TB Gold pending - Will need to proceed with ileocolonoscopy outpatient after steroid taper to reassess for Crohn's versus UC - Presented with leukocytosis with WBC 18.2 and has improved down to 11.8, with increased to 12.8 today and mild drop in hemoglobin to 11.8.  CRP elevated at 4.1. - General surgery following along as well -  pulled NG tube 7/30 and diet advanced yesterday 7/31 to full liquids - + flatus yesterday and liquid BM yesterday afternoon and this morning - New RLQ pain this morning post BM, no radiation of pain, during my visit with him actually stated pain was improving but does seem to start with eating.  No nausea or vomiting.  - Continue Solu-Medrol  60 mg daily today then switch to prednisone 40 mg daily for 2 weeks with gradual taper as long as tolerating p.o. - Assessing gastrin level given history  of peptic ulcer disease with esophagitis which could be exacerbated by steroid treatment.  Plan / Recommendations  Continue full liquids as tolerated -diet per general surgery For now we will continue to hold mesalamine  OOB as tolerated  Continue Solu-Medrol  60 mg daily, can switch to p.o. taper over 4-6 weeks once tolerating p.o. (start with 40 mg a day for 2 weeks then gradual taper of 5 mg/week) Follow-up pending TB Gold Plan for ileocolonoscopy after steroid taper Check gastrin level today PPI twice daily Absolute NSAID cessation Low threshold for repeat imaging given new RLQ pain Could consider simethicone  if concern that abdominal pain is secondary to trapped excessive gas.     LOS: 3 days   08/16/2023, 9:43 AM  Charmaine Melia, MSN, FNP-BC, AGACNP-BC [facility]

## 2023-08-16 NOTE — Progress Notes (Signed)
 PROGRESS NOTE    [name]  FMW:[id number] DOB: 08-27-1940 DOA: 08/13/2023 PCP: Lari Elspeth BRAVO, MD   Brief Narrative:    David Mooney is a 83 y.o. male with medical history significant for DVT, BPH, duodenal ulcer, ulcerative colitis. Patient presented to the ED with complaints of sudden onset of abdominal pain that started today, with abdominal bloating, he reports at least 4 episodes of vomiting-contents were black initially and then dark green, also reports black stools.  Patient was admitted with small bowel obstruction and has had NG tube placement.    7/30: General surgery evaluation not recommending intervention at this time and has suggested GI evaluation for enteritis likely secondary to inflammatory bowel disease.  GI has started patient on IV Solu-Medrol .  7/31: Overall appears to be doing better with NG tube removed and started on full liquid diet with plans for mobilization.  8/1: Continues to have ongoing abdominal pain and remains on full liquid diet.  Encouraged mobilization and remains on IV Solu-Medrol .  Assessment & Plan:   Principal Problem:   SBO (small bowel obstruction) (HCC) Active Problems:   Ulcerative colitis (HCC)   Benign prostatic hyperplasia with urinary obstruction   Right sided abdominal pain   Enteritis   Peptic ulcer disease   Small bowel obstruction (HCC)  Assessment and Plan:  SBO (small bowel obstruction) (HCC) with concern for enteritis-improving Presenting with abdominal pain, vomiting and diarrhea.  Reports black vomitus and black stools.  Stool occult negative.  Leukocytosis of 18.2.  Lactic acid of 2.3 > 2.1.  His eliquis was d/c 7/19.  History of ulcerative colitis, GI ulcers. - CTAP WC- dilated bowel loops are noted in the right lower quadrant with severe inflammatory changes concerning for possible closed loop obstruction, small bowel volvulus or internal hernia.  -Appreciate general surgery and GI recommendations with  Solu-Medrol  initiated - Continue full liquid diet and advance as tolerated.  Encouraged mobilization.   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 His anticoagulation with Eliquis was discontinued 7/19.  DVT prophylaxis: SCDs Code Status: Full Family Communication: Spouse at bedside 8/1 Disposition Plan:  Status is: Inpatient Remains inpatient appropriate because: Need for IV fluids and NG tube placement.  Consultants:  General surgery GI  Procedures:  None  Antimicrobials:  Anti-infectives (From admission, onward)    Start     Dose/Rate Route Frequency Ordered Stop   08/14/23 0000  piperacillin -tazobactam (ZOSYN ) IVPB 3.375 g  Status:  Discontinued        3.375 g 12.5 mL/hr over 240 Minutes Intravenous Every 8 hours 08/13/23 2037 08/15/23 1119   08/13/23 1730  piperacillin -tazobactam (ZOSYN ) IVPB 3.375 g        3.375 g 100 mL/hr over 30 Minutes Intravenous  Once 08/13/23 1725 08/13/23 2036       Subjective: Patient seen and evaluated today and overall appears to be having significant abdominal pain with eating.  He denies any nausea or vomiting and did have a bowel movement earlier this morning.  He states that he has been trying to ambulate.  Objective: Vitals:   08/15/23 1355 08/15/23 2102 08/16/23 0552 08/16/23 1244  BP: 102/60 (!) 158/81 124/60 (!) 144/66  Pulse: 99  65 63 68  Resp: 16 18 16 17   Temp: (!) 97.3 F (36.3 C) 98.8 F (37.1 C) 99.3 F (37.4 C) 98.9 F (37.2 C)  TempSrc: Axillary Oral Oral Oral  SpO2: 99% 100% 92% 95%  Weight:      Height:       No intake or output data in  the 24 hours ending 08/16/23 1330  Filed Weights   08/13/23 1516 08/13/23 2003  Weight: 77.1 kg 78.6 kg    Examination:  General exam: Appears calm and comfortable  Respiratory system: Clear to auscultation. Respiratory effort normal. Cardiovascular system: S1 & S2 heard, RRR.  Gastrointestinal system: Abdomen is soft Central nervous system: Alert and awake Extremities: No edema Skin: No significant lesions noted Psychiatry: Flat affect.    Data Reviewed: I have personally reviewed following labs and imaging studies  CBC: Recent Labs  Lab 08/13/23 1530 08/13/23 2138 08/14/23 0410 08/15/23 0730 08/16/23 0439  WBC 18.2*  --  13.3* 11.8* 12.8*  HGB 13.1 12.2* 13.0 13.2 11.6*  HCT 39.5 37.6* 39.7 40.6 35.0*  MCV 89.4  --  90.4 90.8 88.6  PLT 205  --  188 193 181   Basic Metabolic Panel: Recent Labs  Lab 08/13/23 1530 08/14/23 0410 08/15/23 0730 08/16/23 0439  NA 137 140 143 138  K 3.8 4.1 3.7 3.6  CL 103 106 105 107  CO2 23 24 26 25   GLUCOSE 164* 129* 117* 121*  BUN 20 15 18 21   CREATININE 0.91 0.88 0.98 0.99  CALCIUM 9.7 9.5 10.0 9.3  MG  --   --  2.4 2.2   GFR: Estimated Creatinine Clearance: 54.8 mL/min (by C-G formula based on SCr of 0.99 mg/dL). Liver Function Tests: Recent Labs  Lab 08/13/23 1530 08/15/23 0730  AST 22 17  ALT 18 16  ALKPHOS 55 48  BILITOT 0.7 1.0  PROT 7.0 6.6  ALBUMIN 3.7 3.4*   Recent Labs  Lab 08/13/23 1530  LIPASE 25   No results for input(s): AMMONIA in the last 168 hours. Coagulation Profile: Recent Labs  Lab 08/13/23 1551  INR 1.0   Cardiac Enzymes: No results for input(s): CKTOTAL, CKMB, CKMBINDEX, TROPONINI in the last 168 hours. BNP (last 3 results) No results for input(s): PROBNP in the last 8760 hours. HbA1C: No results for input(s): HGBA1C in the last 72 hours. CBG: No results for input(s): GLUCAP in the last 168 hours. Lipid Profile: No results for input(s): CHOL, HDL,  LDLCALC, TRIG, CHOLHDL, LDLDIRECT in the last 72 hours. Thyroid  Function Tests: No results for input(s): TSH, T4TOTAL, FREET4, T3FREE, THYROIDAB in the last 72 hours. Anemia Panel: No results for input(s): VITAMINB12, FOLATE, FERRITIN, TIBC, IRON, RETICCTPCT in the last 72 hours. Sepsis Labs: Recent Labs  Lab 08/13/23 1551 08/13/23 1743 08/15/23 0733  LATICACIDVEN 2.3* 2.1* 2.2*    Recent Results (from the past 240 hours)  Culture, blood (routine x 2)     Status: None (Preliminary result)   Collection Time: 08/13/23  5:43 PM   Specimen: BLOOD  Result Value Ref Range Status   Specimen Description BLOOD LEFT ANTECUBITAL  Final   Special Requests   Final    BOTTLES DRAWN AEROBIC AND ANAEROBIC Blood Culture adequate volume   Culture   Final    NO GROWTH 3 DAYS Performed at [facility], [address], [place], [place] [address]    Report Status PENDING  Incomplete  Culture, blood (routine x 2)     Status: None (  Preliminary result)   Collection Time: 08/13/23  5:43 PM   Specimen: BLOOD  Result Value Ref Range Status   Specimen Description BLOOD RIGHT ANTECUBITAL  Final   Special Requests   Final    BOTTLES DRAWN AEROBIC AND ANAEROBIC Blood Culture adequate volume   Culture   Final    NO GROWTH 3 DAYS Performed at [facility], [address] [place], [place] [address]    Report Status PENDING  Incomplete         Radiology Studies: No results found.       Scheduled Meds:  finasteride   5 mg Oral Daily   methylPREDNISolone  (SOLU-MEDROL ) injection  60 mg Intravenous Q24H   mirabegron  ER  50 mg Oral Daily   pantoprazole  (PROTONIX ) IV  40 mg Intravenous Q12H   tamsulosin   0.4 mg Oral Daily     LOS: 3 days    Time spent: 55 minutes    Taura Lamarre JONETTA Fairly, DO [facility]  If 7PM-7AM, please contact night-coverage [email or web address] 08/16/2023, 1:30 PM

## 2023-08-17 ENCOUNTER — Ambulatory Visit: Payer: Self-pay | Admitting: Gastroenterology

## 2023-08-17 DIAGNOSIS — K56609 Unspecified intestinal obstruction, unspecified as to partial versus complete obstruction: Secondary | ICD-10-CM | POA: Diagnosis not present

## 2023-08-17 LAB — GASTRIN: Gastrin: 64 pg/mL (ref 0–115)

## 2023-08-17 MED ORDER — PREDNISONE 5 MG PO TABS: ORAL_TABLET | ORAL | 0 refills | Status: AC

## 2023-08-17 NOTE — Progress Notes (Signed)
  Subjective: Patient states he has been passing gas and tolerating clear liquid diet well.  No nausea or vomiting noted.  Objective: Vital signs in last 24 hours: Temp:  [97.5 F (36.4 C)-98.9 F (37.2 C)] 97.9 F (36.6 C) (08/02 0410) Pulse Rate:  [62-68] 62 (08/02 0410) Resp:  [14-17] 14 (08/02 0410) BP: (112-144)/(64-66) 112/64 (08/02 0410) SpO2:  [93 %-96 %] 93 % (08/02 0410) Last BM Date : 08/14/23  Intake/Output from previous day: 08/01 0701 - 08/02 0700 In: 720 [P.O.:720] Out: -  Intake/Output this shift: No intake/output data recorded.  General appearance: alert, cooperative, and no distress GI: soft, non-tender; bowel sounds normal; no masses,  no organomegaly  Lab Results:  Recent Labs    08/15/23 0730 08/16/23 0439  WBC 11.8* 12.8*  HGB 13.2 11.6*  HCT 40.6 35.0*  PLT 193 181   BMET Recent Labs    08/15/23 0730 08/16/23 0439  NA 143 138  K 3.7 3.6  CL 105 107  CO2 26 25  GLUCOSE 117* 121*  BUN 18 21  CREATININE 0.98 0.99  CALCIUM 10.0 9.3   PT/INR No results for input(s): LABPROT, INR in the last 72 hours.  Studies/Results: No results found.  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  Small bowel obstruction seems to have eased.  Has been on IV steroids and is due to switch to p.o. steroids.  Patient would like to try to soft diet, which is fine with me.  Discussed with Dr. Maree.  LOS: 4 days    David Mooney 08/17/2023

## 2023-08-17 NOTE — Discharge Summary (Signed)
 Physician Discharge Summary  David Mooney FMW:[id number] DOB: 10-20-1940 DOA: 08/13/2023  PCP: Lari Elspeth BRAVO, MD  Admit date: 08/13/2023  Discharge date: 08/17/2023  Admitted From:Home  Disposition:  Home  Recommendations for Outpatient Follow-up:  Follow up with PCP in 1-2 weeks Follow-up with GI Dr. Eartha in 4 weeks and remain on steroid taper as prescribed for enteritis Remain on PPI twice daily Eliquis discontinued Continue other home medications as prior  Home Health: None  Equipment/Devices: None  Discharge Condition:Stable  CODE STATUS: Full  Diet recommendation: Heart Healthy  Brief/Interim Summary: David Mooney is a 83 y.o. male with medical history significant for DVT, BPH, duodenal ulcer, ulcerative colitis. Patient presented to the ED with complaints of sudden onset of abdominal pain that started today, with abdominal bloating, he reports at least 4 episodes of vomiting-contents were black initially and then dark green, also reports black stools.  Patient was admitted with small bowel obstruction and has had NG tube placement.     7/30: General surgery evaluation not recommending intervention at this time and has suggested GI evaluation for enteritis likely secondary to inflammatory bowel disease.  GI has started patient on IV Solu-Medrol .   7/31: Overall appears to be doing better with NG tube removed and started on full liquid diet with plans for mobilization.   8/1: Continues to have ongoing abdominal pain and remains on full liquid diet.  Encouraged mobilization and remains on IV Solu-Medrol .  8/2: Now tolerating soft diet with no further symptomatology and is eager for discharge.  GI recommending transition to prednisone  taper over the course of 4-6 weeks which has been prescribed.  Plan to remain on PPI twice daily as prior.  Discharge Diagnoses:  Principal Problem:   SBO (small bowel obstruction) (HCC) Active Problems:   Ulcerative colitis  (HCC)   Benign prostatic hyperplasia with urinary obstruction   Right sided abdominal pain   Enteritis   Peptic ulcer disease   Small bowel obstruction (HCC)  Principal discharge diagnosis: Small bowel obstruction in setting of enteritis.  Discharge Instructions  Discharge Instructions     Ambulatory referral to Gastroenterology   Complete by: As directed    What is the reason for referral?: Other   Ambulatory referral to Physical Therapy   Complete by: As directed    Diet - low sodium heart healthy   Complete by: As directed    Increase activity slowly   Complete by: As directed       Allergies as of 08/17/2023   No Known Allergies      Medication List     STOP taking these medications    cephALEXin 500 MG capsule Commonly known as: KEFLEX   Eliquis 5 MG Tabs tablet Generic drug: apixaban   predniSONE  10 MG (21) Tbpk tablet Commonly known as: STERAPRED UNI-PAK 21 TAB Replaced by: predniSONE  5 MG tablet       TAKE these medications    acetaminophen  650 MG CR tablet Commonly known as: TYLENOL  Take 1,300 mg by mouth every 8 (eight) hours as needed for pain.   atorvastatin 20 MG tablet Commonly known as: LIPITOR Take 20 mg by mouth daily.   esomeprazole  40 MG capsule Commonly known as: NexIUM  Take 1 capsule (40 mg total) by mouth 2 (two) times daily before a meal.   finasteride  5 MG tablet Commonly known as: PROSCAR  Take 1 tablet (5 mg total) by mouth daily. TAKE 1 TABLET(5 MG) BY MOUTH DAILY   mesalamine  1.2 g  EC tablet Commonly known as: LIALDA  TAKE 2 TABLETS(2.4 GRAMS) BY MOUTH TWICE DAILY   mirabegron  ER 50 MG Tb24 tablet Commonly known as: MYRBETRIQ  Take 1 tablet (50 mg total) by mouth daily.   Mucinex Maximum Strength 1200 MG Tb12 Generic drug: Guaifenesin Take 1 tablet by mouth 2 (two) times daily as needed (chest congestion).   multivitamin tablet Take 1 tablet by mouth daily.   predniSONE  5 MG tablet Commonly known as:  DELTASONE  Take 8 tablets (40 mg total) by mouth daily with breakfast for 7 days, THEN 7 tablets (35 mg total) daily with breakfast for 7 days, THEN 6 tablets (30 mg total) daily with breakfast for 7 days, THEN 5 tablets (25 mg total) daily with breakfast for 7 days, THEN 4 tablets (20 mg total) daily with breakfast for 7 days, THEN 3 tablets (15 mg total) daily with breakfast for 7 days, THEN 2 tablets (10 mg total) daily with breakfast for 7 days, THEN 1 tablet (5 mg total) daily with breakfast for 7 days. Start taking on: August 17, 2023 Replaces: predniSONE  10 MG (21) Tbpk tablet   sucralfate  1 GM/10ML suspension Commonly known as: CARAFATE  Take 10 mLs (1 g total) by mouth 4 (four) times daily -  with meals and at bedtime.   tamsulosin  0.4 MG Caps capsule Commonly known as: FLOMAX  Take 1 capsule (0.4 mg total) by mouth daily.        Follow-up Information     Burdine, Elspeth BRAVO, MD. Schedule an appointment as soon as possible for a visit in 1 week(s).   Specialty: Family Medicine Contact information: [address] [place] [address] [phone]         Eartha Flavors, Toribio, MD. Go in 4 week(s).   Specialty: Gastroenterology Contact information: [address] [place] [address] [phone]                No Known Allergies  Consultations: General surgery GI   Procedures/Studies: DG Chest Portable 1 View Result Date: 08/13/2023 CLINICAL DATA:  NG tube placement EXAM: PORTABLE CHEST 1 VIEW COMPARISON:  01/04/2023 FINDINGS: NG tube tip is near the GE junction. The side port is in the distal esophagus. This could be advanced several cm for optimal positioning in the stomach. Heart is normal size. Visualized lungs clear. No effusions. IMPRESSION: NG tube tip near the GE junction. Recommend advancing several cm further into the stomach. Electronically Signed   By: Franky Crease M.D.   On: 08/13/2023 18:56   CT ABDOMEN PELVIS W CONTRAST Result  Date: 08/13/2023 CLINICAL DATA:  Acute generalized abdominal pain, vomiting. EXAM: CT ABDOMEN AND PELVIS WITH CONTRAST TECHNIQUE: Multidetector CT imaging of the abdomen and pelvis was performed using the standard protocol following bolus administration of intravenous contrast. RADIATION DOSE REDUCTION: This exam was performed according to the departmental dose-optimization program which includes automated exposure control, adjustment of the mA and/or kV according to patient size and/or use of iterative reconstruction technique. CONTRAST:  OMNIPAQUE  IOHEXOL  300 MG/ML  SOLN COMPARISON:  December 31, 2022. FINDINGS: Lower chest: No acute abnormality. Hepatobiliary: No focal liver abnormality is seen. No gallstones, gallbladder wall thickening, or biliary dilatation. Pancreas: Unremarkable. No pancreatic ductal dilatation or surrounding inflammatory changes. Spleen: Normal in size without focal abnormality. Adrenals/Urinary Tract: Adrenal glands appear normal. Left renal cysts are noted. No hydronephrosis or renal obstruction is noted. Urinary bladder is unremarkable. Stomach/Bowel: Stomach is unremarkable. The appendix is not clearly visualized. The colon  is unremarkable. However, there is severe wall thickening and inflammatory changes seen involving the ileum concerning for possible closed loop obstruction or internal hernia given focal narrowing seen on image number 43 of series 2. This appears to result in dilatation of more proximal small bowel loops. Vascular/Lymphatic: Aortic atherosclerosis. No enlarged abdominal or pelvic lymph nodes. Reproductive: Prostate is unremarkable. Other: Small fat containing periumbilical hernia. Small amount of free fluid or ascites is noted around the liver and spleen. It is also present in the right lower quadrant. Musculoskeletal: No acute or significant osseous findings. IMPRESSION: Dilated bowel loops are noted in the right lower quadrant with severe inflammatory changes  concerning for possible closed loop obstruction, small bowel volvulus or internal hernia. Small amount of free fluid or ascites is noted as well. Critical Value/emergent results were called by telephone at the time of interpretation on 08/13/2023 at 5:23 pm to provider [facility] , who verbally acknowledged these results. Electronically Signed   By: Lynwood Landy Raddle M.D.   On: 08/13/2023 17:24   DG Abd 1 View Result Date: 07/22/2023 CLINICAL DATA:  History of kidney stones. EXAM: ABDOMEN - 1 VIEW COMPARISON:  01/23/2023 FINDINGS: No visualized urolithiasis. Calcification in the pelvis are unchanged in typical of phleboliths. Normal bowel gas. Small volume of formed stool in the colon. Mild scoliosis and degenerative change in the spine. IMPRESSION: No visualized urolithiasis. Electronically Signed   By: Andrea Gasman M.D.   On: 07/22/2023 20:15     Discharge Exam: Vitals:   08/16/23 2027 08/17/23 0410  BP: 128/64 112/64  Pulse: 63 62  Resp: 16 14  Temp: (!) 97.5 F (36.4 C) 97.9 F (36.6 C)  SpO2: 96% 93%   Vitals:   08/16/23 0552 08/16/23 1244 08/16/23 2027 08/17/23 0410  BP: 124/60 (!) 144/66 128/64 112/64  Pulse: 63 68 63 62  Resp: 16 17 16 14   Temp: 99.3 F (37.4 C) 98.9 F (37.2 C) (!) 97.5 F (36.4 C) 97.9 F (36.6 C)  TempSrc: Oral Oral Oral Oral  SpO2: 92% 95% 96% 93%  Weight:      Height:        General: Pt is alert, awake, not in acute distress Cardiovascular: RRR, S1/S2 +, no rubs, no gallops Respiratory: CTA bilaterally, no wheezing, no rhonchi Abdominal: Soft, NT, ND, bowel sounds + Extremities: no edema, no cyanosis    The results of significant diagnostics from this hospitalization (including imaging, microbiology, ancillary and laboratory) are listed below for reference.     Microbiology: Recent Results (from the past 240 hours)  Culture, blood (routine x 2)     Status: None (Preliminary result)   Collection Time: 08/13/23  5:43 PM   Specimen:  BLOOD  Result Value Ref Range Status   Specimen Description BLOOD LEFT ANTECUBITAL  Final   Special Requests   Final    BOTTLES DRAWN AEROBIC AND ANAEROBIC Blood Culture adequate volume   Culture   Final    NO GROWTH 4 DAYS Performed at [facility], [address] [place], [place] [address]    Report Status PENDING  Incomplete  Culture, blood (routine x 2)     Status: None (Preliminary result)   Collection Time: 08/13/23  5:43 PM   Specimen: BLOOD  Result Value Ref Range Status   Specimen Description BLOOD RIGHT ANTECUBITAL  Final   Special Requests   Final    BOTTLES DRAWN AEROBIC AND ANAEROBIC Blood Culture adequate volume   Culture   Final  NO GROWTH 4 DAYS Performed at [facility], [address] [place], [place] [address]    Report Status PENDING  Incomplete     Labs: BNP (last 3 results) No results for input(s): BNP in the last 8760 hours. Basic Metabolic Panel: Recent Labs  Lab 08/13/23 1530 08/14/23 0410 08/15/23 0730 08/16/23 0439  NA 137 140 143 138  K 3.8 4.1 3.7 3.6  CL 103 106 105 107  CO2 23 24 26 25   GLUCOSE 164* 129* 117* 121*  BUN 20 15 18 21   CREATININE 0.91 0.88 0.98 0.99  CALCIUM 9.7 9.5 10.0 9.3  MG  --   --  2.4 2.2   Liver Function Tests: Recent Labs  Lab 08/13/23 1530 08/15/23 0730  AST 22 17  ALT 18 16  ALKPHOS 55 48  BILITOT 0.7 1.0  PROT 7.0 6.6  ALBUMIN 3.7 3.4*   Recent Labs  Lab 08/13/23 1530  LIPASE 25   No results for input(s): AMMONIA in the last 168 hours. CBC: Recent Labs  Lab 08/13/23 1530 08/13/23 2138 08/14/23 0410 08/15/23 0730 08/16/23 0439  WBC 18.2*  --  13.3* 11.8* 12.8*  HGB 13.1 12.2* 13.0 13.2 11.6*  HCT 39.5 37.6* 39.7 40.6 35.0*  MCV 89.4  --  90.4 90.8 88.6  PLT 205  --  188 193 181   Cardiac Enzymes: No results for input(s): CKTOTAL, CKMB, CKMBINDEX, TROPONINI in the last 168 hours. BNP: Invalid input(s): POCBNP CBG: No results for input(s): GLUCAP in the last 168  hours. D-Dimer No results for input(s): DDIMER in the last 72 hours. Hgb A1c No results for input(s): HGBA1C in the last 72 hours. Lipid Profile No results for input(s): CHOL, HDL, LDLCALC, TRIG, CHOLHDL, LDLDIRECT in the last 72 hours. Thyroid  function studies No results for input(s): TSH, T4TOTAL, T3FREE, THYROIDAB in the last 72 hours.  Invalid input(s): FREET3 Anemia work up No results for input(s): VITAMINB12, FOLATE, FERRITIN, TIBC, IRON, RETICCTPCT in the last 72 hours. Urinalysis    Component Value Date/Time   COLORURINE STRAW (A) 08/13/2023 1805   APPEARANCEUR CLEAR 08/13/2023 1805   APPEARANCEUR Clear 07/23/2023 0937   LABSPEC 1.035 (H) 08/13/2023 1805   PHURINE 6.0 08/13/2023 1805   GLUCOSEU NEGATIVE 08/13/2023 1805   HGBUR NEGATIVE 08/13/2023 1805   BILIRUBINUR NEGATIVE 08/13/2023 1805   BILIRUBINUR Negative 07/23/2023 0937   KETONESUR NEGATIVE 08/13/2023 1805   PROTEINUR NEGATIVE 08/13/2023 1805   NITRITE NEGATIVE 08/13/2023 1805   LEUKOCYTESUR NEGATIVE 08/13/2023 1805   Sepsis Labs Recent Labs  Lab 08/13/23 1530 08/14/23 0410 08/15/23 0730 08/16/23 0439  WBC 18.2* 13.3* 11.8* 12.8*   Microbiology Recent Results (from the past 240 hours)  Culture, blood (routine x 2)     Status: None (Preliminary result)   Collection Time: 08/13/23  5:43 PM   Specimen: BLOOD  Result Value Ref Range Status   Specimen Description BLOOD LEFT ANTECUBITAL  Final   Special Requests   Final    BOTTLES DRAWN AEROBIC AND ANAEROBIC Blood Culture adequate volume   Culture   Final    NO GROWTH 4 DAYS Performed at [facility], [address], [place], [place] [address]    Report Status PENDING  Incomplete  Culture, blood (routine x 2)     Status: None (Preliminary result)   Collection Time: 08/13/23  5:43 PM   Specimen: BLOOD  Result Value Ref Range Status   Specimen Description BLOOD RIGHT ANTECUBITAL  Final   Special Requests  Final     BOTTLES DRAWN AEROBIC AND ANAEROBIC Blood Culture adequate volume   Culture   Final    NO GROWTH 4 DAYS Performed at [facility], [address] [place], [place] [address]    Report Status PENDING  Incomplete     Time coordinating discharge: 35 minutes  SIGNED:   Adron JONETTA Fairly, DO [facility] 08/17/2023, 10:52 AM  If 7PM-7AM, please contact night-coverage [email or web address]

## 2023-08-18 LAB — CULTURE, BLOOD (ROUTINE X 2)
Culture: NO GROWTH
Culture: NO GROWTH
Special Requests: ADEQUATE
Special Requests: ADEQUATE

## 2023-08-19 LAB — QUANTIFERON-TB GOLD PLUS (RQFGPL)
QuantiFERON Mitogen Value: 0.38 [IU]/mL
QuantiFERON Nil Value: 0.03 [IU]/mL
QuantiFERON TB1 Ag Value: 0.02 [IU]/mL
QuantiFERON TB2 Ag Value: 0.03 [IU]/mL

## 2023-08-19 LAB — QUANTIFERON-TB GOLD PLUS: QuantiFERON-TB Gold Plus: UNDETERMINED — AB

## 2023-08-21 ENCOUNTER — Ambulatory Visit: Admitting: Urology

## 2023-08-21 ENCOUNTER — Other Ambulatory Visit (INDEPENDENT_AMBULATORY_CARE_PROVIDER_SITE_OTHER): Payer: Self-pay

## 2023-08-21 DIAGNOSIS — Z111 Encounter for screening for respiratory tuberculosis: Secondary | ICD-10-CM

## 2023-08-22 ENCOUNTER — Encounter (INDEPENDENT_AMBULATORY_CARE_PROVIDER_SITE_OTHER): Payer: Self-pay | Admitting: Gastroenterology

## 2023-08-22 ENCOUNTER — Ambulatory Visit (INDEPENDENT_AMBULATORY_CARE_PROVIDER_SITE_OTHER): Admitting: Gastroenterology

## 2023-08-22 VITALS — BP 113/54 | HR 76 | Temp 97.1°F | Ht 66.0 in | Wt 171.5 lb

## 2023-08-22 DIAGNOSIS — E782 Mixed hyperlipidemia: Secondary | ICD-10-CM | POA: Diagnosis not present

## 2023-08-22 DIAGNOSIS — K269 Duodenal ulcer, unspecified as acute or chronic, without hemorrhage or perforation: Secondary | ICD-10-CM | POA: Diagnosis not present

## 2023-08-22 DIAGNOSIS — Z6826 Body mass index (BMI) 26.0-26.9, adult: Secondary | ICD-10-CM | POA: Diagnosis not present

## 2023-08-22 DIAGNOSIS — Z111 Encounter for screening for respiratory tuberculosis: Secondary | ICD-10-CM

## 2023-08-22 DIAGNOSIS — Z86718 Personal history of other venous thrombosis and embolism: Secondary | ICD-10-CM | POA: Diagnosis not present

## 2023-08-22 DIAGNOSIS — K50819 Crohn's disease of both small and large intestine with unspecified complications: Secondary | ICD-10-CM

## 2023-08-22 DIAGNOSIS — K5190 Ulcerative colitis, unspecified, without complications: Secondary | ICD-10-CM | POA: Diagnosis not present

## 2023-08-22 DIAGNOSIS — K311 Adult hypertrophic pyloric stenosis: Secondary | ICD-10-CM | POA: Diagnosis not present

## 2023-08-22 DIAGNOSIS — N401 Enlarged prostate with lower urinary tract symptoms: Secondary | ICD-10-CM | POA: Diagnosis not present

## 2023-08-22 DIAGNOSIS — K50019 Crohn's disease of small intestine with unspecified complications: Secondary | ICD-10-CM

## 2023-08-22 NOTE — Patient Instructions (Addendum)
 Schedule colonoscopy Continue prednisone  taper as instructed Perform blood workup Continue esomeprazole  40 mg twice a day Stop Lialda 

## 2023-08-22 NOTE — Progress Notes (Unsigned)
 Toribio Fortune, M.D. Gastroenterology & Hepatology [facility] [address] [place], [place] [address]  Primary Care Physician: Lari Elspeth BRAVO, MD [address] [place] [address]  I will communicate my assessment and recommendations to the referring MD via EMR.  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  The patient was last seen on 05/20/2023. At that time, the patient was continued on Carafate  every 6 hours and esomeprazole  40 mg twice daily.  Was also advised to continue Lialda  2.4 g twice daily which she had been taking chronically.  EGD was performed with findings described below..  Patient reports he presented acute onset of vomiting green contents on 08/13/2023. He also had significant abdominal pain and abdominal distention. He asked his wife to call EMS due to the severity of his symptoms.  Reports he had never felt something similar in the past.  Patient was seen in the ER at [facility] on 08/13/2023.  CT of the abdomen pelvis with IV contrast showed dilated bowel loops in the right lower quadrant with severe inflammation concerning for possible closed-loop obstruction.  General surgery was consulted, considered that enteritis was secondary to inflammatory bowel disease.  Patient had NG tube placed and decompressed. He was started on IV methylprednisolone .  Patient is significant improvement of abdominal distention and was able to advance diet.  Patient was discharged home on a steroid taper 40 mg daily, with a taper of 5  mg/week.  He was advised to proceed with ileocolonoscopy after finishing taper. He had some previous episodes of anemia, with small drop in hemoglobin down to 11.6 on the day of discharge.  Gastrin level was checked which was normal at 64.  Patient had indeterminate QuantiFERON testing, pending result of repeat TB testing.  Hepatitis B surface antigen was negative.  Patient denies having any abdominal pain or other complaints. First bowel movement was on Saturday, has had more regular Bms since then. The patient denies having any nausea, vomiting, fever, chills, hematochezia, melena, hematemesis,  diarrhea, jaundice, pruritus or weight loss.  He has been taking prednisone  40 mg qday.  Last flu shot:possibly 2024 Last pneumonia shot:had it in the past, does not know when Last zoster vaccine: had it in the past, does not know when COVID-19 shot: 2 shots and some boosters   Last EGD: 07/09/2023 - Discolored mucosa in the esophagus. Biopsied. - Pills were found in the stomach. - Non- bleeding gastric ulcer with a clean ulcer base ( Forrest Class III) . Biopsied. Partial pyloric stenosis. - Normal examined duodenum.  Recommended repeat endoscopy in 4 months.   Last Colonoscopy: 12/04/2018 Normal terminal ileum Area of mildly congested mucosa in the cecum which was biopsied (mildly active chronic colitis), multiple scars in the colon related to healed colitis, 2 small polyps in the splenic flexure.  Pathology consistent with tubular adenomas. Recommended repeat colonoscopy in 5 years.  Past Medical History: Past Medical History:  Diagnosis Date   Arthritis    Cataract    Complication of anesthesia    Embolus (HCC) 12/2022   left Hip   GERD (gastroesophageal reflux disease)  Glaucoma    Hernia of unspecified site of abdominal cavity without mention of obstruction or gangrene    History of gastric ulcer    History of kidney stones    PONV (postoperative nausea and vomiting)     Ulcerative colitis     Past Surgical History: Past Surgical History:  Procedure Laterality Date   BIOPSY  12/04/2018   Procedure: BIOPSY;  Surgeon: Golda Claudis PENNER, MD;  Location: [facility];  Service: Endoscopy;;  cecum   COLONOSCOPY     COLONOSCOPY N/A 08/27/2012   Procedure: COLONOSCOPY;  Surgeon: Claudis PENNER Golda, MD;  Location: [facility];  Service: Endoscopy;  Laterality: N/A;  1200-moved to 13:15 Ann to notify pt   COLONOSCOPY N/A 12/04/2018   Procedure: COLONOSCOPY;  Surgeon: Golda Claudis PENNER, MD;  Location: [facility];  Service: Endoscopy;  Laterality: N/A;  155pm   CYSTOSCOPY WITH INSERTION OF UROLIFT N/A 04/30/2022   Procedure: CYSTOSCOPY WITH INSERTION OF UROLIFT;  Surgeon: Sherrilee Belvie CROME, MD;  Location: [facility];  Service: Urology;  Laterality: N/A;   ESOPHAGOGASTRODUODENOSCOPY N/A 07/09/2023   Procedure: EGD (ESOPHAGOGASTRODUODENOSCOPY);  Surgeon: Eartha Flavors, Toribio, MD;  Location: [facility];  Service: Gastroenterology;  Laterality: N/A;  7:30AM;ASA 3   ESOPHAGOGASTRODUODENOSCOPY (EGD) WITH PROPOFOL   01/04/2023   Procedure: ESOPHAGOGASTRODUODENOSCOPY (EGD) WITH PROPOFOL ;  Surgeon: Cindie Carlin POUR, DO;  Location: [facility];  Service: Endoscopy;;   ESOPHAGOGASTRODUODENOSCOPY (EGD) WITH PROPOFOL  N/A 04/09/2023   Procedure: ESOPHAGOGASTRODUODENOSCOPY (EGD) WITH PROPOFOL ;  Surgeon: Eartha Flavors Toribio, MD;  Location: [facility];  Service: Gastroenterology;  Laterality: N/A;  7:30am;asa 3   HERNIA REPAIR Left    MOLE REMOVAL     From head , chest   UPPER GASTROINTESTINAL ENDOSCOPY      Family History: Family History  Problem Relation Age of Onset   Arrhythmia Mother    Anuerysm Father    Hypertension Father    Diabetes Father    Healthy Sister    Healthy Sister    Irritable bowel syndrome Daughter    Thyroid  disease Daughter     Social History: Social History   Tobacco Use  Smoking Status Never  Smokeless Tobacco Never   Social  History   Substance and Sexual Activity  Alcohol  Use No   Social History   Substance and Sexual Activity  Drug Use No    Allergies: No Known Allergies  Medications: Current Outpatient Medications  Medication Sig Dispense Refill   acetaminophen  (TYLENOL ) 650 MG CR tablet Take 1,300 mg by mouth every 8 (eight) hours as needed for pain.     atorvastatin (LIPITOR) 20 MG tablet Take 20 mg by mouth daily.     esomeprazole  (NEXIUM ) 40 MG capsule Take 1 capsule (40 mg total) by mouth 2 (two) times daily before a meal. 60 capsule 5   finasteride  (PROSCAR ) 5 MG tablet Take 1 tablet (5 mg total) by mouth daily. TAKE 1 TABLET(5 MG) BY MOUTH DAILY 90 tablet 3   Guaifenesin (MUCINEX MAXIMUM STRENGTH) 1200 MG TB12 Take 1 tablet by mouth 2 (two) times daily as needed (chest congestion).     mesalamine  (LIALDA ) 1.2 g EC tablet TAKE 2 TABLETS(2.4 GRAMS) BY MOUTH TWICE DAILY 360 tablet 3   mirabegron  ER (MYRBETRIQ ) 50 MG TB24 tablet Take 1 tablet (50 mg total) by mouth daily. 30 tablet 5   predniSONE  (DELTASONE ) 5 MG tablet Take 8 tablets (40 mg total) by mouth daily with breakfast for 7 days, THEN 7  tablets (35 mg total) daily with breakfast for 7 days, THEN 6 tablets (30 mg total) daily with breakfast for 7 days, THEN 5 tablets (25 mg total) daily with breakfast for 7 days, THEN 4 tablets (20 mg total) daily with breakfast for 7 days, THEN 3 tablets (15 mg total) daily with breakfast for 7 days, THEN 2 tablets (10 mg total) daily with breakfast for 7 days, THEN 1 tablet (5 mg total) daily with breakfast for 7 days. (Patient taking differently: Take 8 tablets (40 mg total) by mouth daily with breakfast for 7 days, THEN 7 tablets (35 mg total) daily with breakfast for 7 days, THEN 6 tablets (30 mg total) daily with breakfast for 7 days, THEN 5 tablets (25 mg total) daily with breakfast for 7 days, THEN 4 tablets (20 mg total) daily with breakfast for 7 days, THEN 3 tablets (15 mg total) daily with breakfast for  7 days, THEN 2 tablets (10 mg total) daily with breakfast for 7 days, THEN 1 tablet (5 mg total) daily with breakfast for 7 days.currently taking 40 mg every day as of 08/22/2023.) 252 tablet 0   sucralfate  (CARAFATE ) 1 GM/10ML suspension Take 10 mLs (1 g total) by mouth 4 (four) times daily -  with meals and at bedtime. 2400 mL 1   tamsulosin  (FLOMAX ) 0.4 MG CAPS capsule Take 1 capsule (0.4 mg total) by mouth daily. 30 capsule 11   Multiple Vitamin (MULTIVITAMIN) tablet Take 1 tablet by mouth daily.     No current facility-administered medications for this visit.    Review of Systems: GENERAL: negative for malaise, night sweats HEENT: No changes in hearing or vision, no nose bleeds or other nasal problems. NECK: Negative for lumps, goiter, pain and significant neck swelling RESPIRATORY: Negative for cough, wheezing CARDIOVASCULAR: Negative for chest pain, leg swelling, palpitations, orthopnea GI: SEE HPI MUSCULOSKELETAL: Negative for joint pain or swelling, back pain, and muscle pain. SKIN: Negative for lesions, rash PSYCH: Negative for sleep disturbance, mood disorder and recent psychosocial stressors. HEMATOLOGY Negative for prolonged bleeding, bruising easily, and swollen nodes. ENDOCRINE: Negative for cold or heat intolerance, polyuria, polydipsia and goiter. NEURO: negative for tremor, gait imbalance, syncope and seizures. The remainder of the review of systems is noncontributory.   Physical Exam: BP (!) 113/54 (BP Location: Left Arm, Patient Position: Sitting, Cuff Size: Normal)   Pulse 76   Temp (!) 97.1 F (36.2 C) (Temporal)   Ht 5' 6 (1.676 m)   Wt 171 lb 8 oz (77.8 kg)   BMI 27.68 kg/m  GENERAL: The patient is AO x3, in no acute distress. HEENT: Head is normocephalic and atraumatic. EOMI are intact. Mouth is well hydrated and without lesions. NECK: Supple. No masses LUNGS: Clear to auscultation. No presence of rhonchi/wheezing/rales. Adequate chest expansion HEART:  RRR, normal s1 and s2. ABDOMEN: Soft, nontender, no guarding, no peritoneal signs, and nondistended. BS +. No masses. EXTREMITIES: Without any cyanosis, clubbing, rash, lesions or edema. NEUROLOGIC: AOx3, no focal motor deficit. SKIN: no jaundice, no rashes  Imaging/Labs: as above  I personally reviewed and interpreted the available labs, imaging and endoscopic files.  Impression and Plan: David Mooney is a 83 y.o. 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  Patient has been managed with ELF for multiple years for possible ulcerative colitis.  However, he had  a recent admission to the hospital after presenting severe inflammatory changes in the small bowel which responded to IV steroids and NG tube decompression.  I suspect he had been mislabeled as ulcerative colitis, and is clearly presenting with small bowel involvement supporting Crohn's disease.  We discussed in detail that Lialda  is not the most suitable medication for Crohn's disease and we will need to try different medications from the ones he is currently taking.  Given his age group, I suspect that he will benefit from switching to other medications such as Entyvio, Skyrizi, Tremfya or Stelara.  This was discussed with the patient who was interested in this.  Will proceed with a colonoscopy in a few weeks.  For now he should continue taking his prednisone  taper, decreasing to 5 mg/week.  He can stop the Lialda  at this moment as I do not see any benefit on continuing this medication.  We will repeat his QuantiFERON as he is previous result was indeterminate.  Finally, given his history of duodenal ulcer, we will continue on esomeprazole  40 mg twice a day.  He will need to have a repeat EGD in October to dilate his gastric outlet  obstruction.  -Schedule colonoscopy -Continue prednisone  taper as instructed -Check QuantiFERON -Continue esomeprazole  40 mg twice a day -Stop Lialda   All questions were answered.      Toribio Fortune, MD Gastroenterology and Hepatology [facility]

## 2023-08-23 DIAGNOSIS — Z111 Encounter for screening for respiratory tuberculosis: Secondary | ICD-10-CM | POA: Diagnosis not present

## 2023-08-26 ENCOUNTER — Telehealth (INDEPENDENT_AMBULATORY_CARE_PROVIDER_SITE_OTHER): Payer: Self-pay | Admitting: Gastroenterology

## 2023-08-26 NOTE — Telephone Encounter (Signed)
 Noted, Will discuss with the patient and his daughter the recent ov note with Dr. Eartha below.  a recent admission to the hospital after presenting severe inflammatory changes in the small bowel which responded to IV steroids and NG tube decompression.  I suspect he had been mislabeled as ulcerative colitis, and is clearly presenting with small bowel involvement supporting Crohn's disease.  We discussed in detail that Lialda  is not the most suitable medication for Crohn's disease and we will need to try different medications from the ones he is currently taking.  Given his age group, I suspect that he will benefit from switching to other medications such as Entyvio, Skyrizi, Tremfya or Stelara.  This was discussed with the patient who was interested in this.  Will proceed with a colonoscopy in a few weeks.  For now he should continue taking his prednisone  taper, decreasing to 5 mg/week.  He can stop the Lialda  at this moment as I do not see any benefit on continuing this medication.  We will repeat his QuantiFERON as he is previous result was indeterminate.   Finally, given his history of duodenal ulcer, we will continue on esomeprazole  40 mg twice a day.  He will need to have a repeat EGD in October to dilate his gastric outlet obstruction.   -Schedule colonoscopy -Continue prednisone  taper as instructed -Check QuantiFERON -Continue esomeprazole  40 mg twice a day -Stop Lialda    All questions were answered.       Toribio Eartha, MD Gastroenterology and Hepatology

## 2023-08-26 NOTE — Telephone Encounter (Signed)
 Patient saw Dr Eartha on 08/22/2023. Daughter, Eleanor, came by today asking about his medication changes. I told her since she wasn't on the DPR we wouldn't be able to speak with her. She and patient are coming back tomorrow to sign DPR and have her added.

## 2023-08-27 NOTE — Telephone Encounter (Signed)
 Patient and his daughter Eleanor came by the office today to sign a DPR so we could discuss with her what the patient needs to be doing.   I explained to them per Dr. Samuel last ov note: -Schedule colonoscopy -Continue prednisone  taper as instructed -Check QuantiFERON -Continue esomeprazole  40 mg twice a day -Stop Lialda   I asked that they investigate which of the medications for Crohn's his insurance is preferred, and to let us  know what they find out.   They state understanding and will follow all of the instructions above.

## 2023-08-28 ENCOUNTER — Encounter: Payer: Self-pay | Admitting: *Deleted

## 2023-08-28 ENCOUNTER — Ambulatory Visit (INDEPENDENT_AMBULATORY_CARE_PROVIDER_SITE_OTHER): Payer: Self-pay | Admitting: Gastroenterology

## 2023-08-28 ENCOUNTER — Other Ambulatory Visit: Payer: Self-pay | Admitting: *Deleted

## 2023-08-28 LAB — QUANTIFERON-TB GOLD PLUS
Mitogen-NIL: 5.99 [IU]/mL
NIL: 0.02 [IU]/mL
QuantiFERON-TB Gold Plus: NEGATIVE
TB1-NIL: 0 [IU]/mL
TB2-NIL: 0 [IU]/mL

## 2023-08-28 MED ORDER — PEG 3350-KCL-NA BICARB-NACL 420 G PO SOLR: 4000.0000 mL | Freq: Once | ORAL | 0 refills | Status: AC

## 2023-09-02 DIAGNOSIS — L820 Inflamed seborrheic keratosis: Secondary | ICD-10-CM | POA: Diagnosis not present

## 2023-09-02 DIAGNOSIS — L821 Other seborrheic keratosis: Secondary | ICD-10-CM | POA: Diagnosis not present

## 2023-09-02 NOTE — Telephone Encounter (Signed)
 Thanks

## 2023-09-10 DIAGNOSIS — K080 Exfoliation of teeth due to systemic causes: Secondary | ICD-10-CM | POA: Diagnosis not present

## 2023-09-12 ENCOUNTER — Other Ambulatory Visit: Payer: Self-pay | Admitting: Gastroenterology

## 2023-09-12 ENCOUNTER — Telehealth (INDEPENDENT_AMBULATORY_CARE_PROVIDER_SITE_OTHER): Payer: Self-pay

## 2023-09-12 DIAGNOSIS — K50818 Crohn's disease of both small and large intestine with other complication: Secondary | ICD-10-CM

## 2023-09-12 DIAGNOSIS — K5080 Crohn's disease of both small and large intestine without complications: Secondary | ICD-10-CM | POA: Insufficient documentation

## 2023-09-12 NOTE — Telephone Encounter (Signed)
 Thanks, I have placed orders for Tremfya induction dose.

## 2023-09-12 NOTE — Telephone Encounter (Signed)
 Patient daughter Eleanor Farrow called today to let you know they have checked with the patient's insurance and the Entyvio is non formulary, but the Tremfya, Skyrizi, and Stelara are on the Formulary and they are tier 5 and require a PA, patient cost would be 33% of the cost of the medication, but cost is not an issue for the patient. Patient is leaning towards Tremfya, but will start on either at your suggestion. Please advise. I asked that Melissa please find out which specialty pharmacy the patient will have to get his maintenance drug from. She will call us  back regarding this.

## 2023-09-12 NOTE — Telephone Encounter (Signed)
 I spoke with the patient daughter Eleanor she is aware we have placed the orders and [facility] would be in touch in the next few days after they get authorization through patient's insurance plan. She will let me know what Specialty pharmacy patient insurance requires us  to use for the maintenance dosing for Tremfya.

## 2023-09-13 ENCOUNTER — Telehealth: Payer: Self-pay

## 2023-09-13 NOTE — Telephone Encounter (Signed)
 Hello,  Patient will be scheduled as soon as possible.  Auth Submission: NO AUTH NEEDED Site of care: Site of care: [facility] Payer: [employer] MEDICARE Medication & CPT/J Code(s) submitted: Tremfya [id number] Diagnosis Code:  Route of submission (phone, fax, portal): phone Phone # Fax # Auth type: Buy/Bill PB Units/visits requested: 200mg  x 3doses Reference number: [id number] Approval from: 09/13/23 to 01/15/24

## 2023-09-13 NOTE — Telephone Encounter (Signed)
 Thanks so much Keyana, we will work on Tramfya SQ injections Thanks

## 2023-09-17 ENCOUNTER — Ambulatory Visit: Admitting: Urology

## 2023-09-19 NOTE — Telephone Encounter (Signed)
 Patient's Tremfya  maintenance dose per Toribio Eartha Flavors, MD would be 200 mg every 4 weeks, starting at week 12

## 2023-09-20 ENCOUNTER — Other Ambulatory Visit (HOSPITAL_COMMUNITY): Payer: Self-pay | Admitting: Gastroenterology

## 2023-09-20 MED ORDER — TREMFYA PEN 200 MG/2ML ~~LOC~~ SOAJ
200.0000 mg | SUBCUTANEOUS | 12 refills | Status: DC
  Filled ????-??-??: fill #0

## 2023-09-23 ENCOUNTER — Telehealth: Payer: Self-pay

## 2023-09-23 ENCOUNTER — Encounter: Payer: Self-pay | Admitting: Gastroenterology

## 2023-09-23 ENCOUNTER — Other Ambulatory Visit (HOSPITAL_COMMUNITY): Payer: Self-pay

## 2023-09-23 NOTE — Telephone Encounter (Signed)
 Pharmacy Patient Advocate Encounter   Received notification from Physician's Office that prior authorization for Tremfya  200MG /2ML syringes is required/requested.   Insurance verification completed.   The patient is insured through [employer].   Per test claim: PA required; PA submitted to above mentioned insurance via Latent Key/confirmation #/EOC [id number] Status is pending

## 2023-09-23 NOTE — Telephone Encounter (Signed)
-------  Fax Transmission Report-------  To:               Recipient at [phone] Subject:          Whitney Lamp: This is the approval on Carrson Thune's Tremfya  Result:           The transmission was successful. Explanation:      All Pages Ok Pages Sent:       6 Connect Time:     3 minutes, 50 seconds Transmit Time:    09/23/2023 16:35 Transfer Rate:    14400 Status Code:      0000 Retry Count:      0 Job Id:           [id number] Unique Id:        [id number] Fax Line:         38 Fax Server:       [id number]

## 2023-09-23 NOTE — Telephone Encounter (Signed)
 Hey yall, Erminio with [employer] called stating the patient Tremfya  200mg /16ml is approved from 09/23/2023-09/22/2024 Reference # [id number]. She will be faxing this information over and I will forward to you Keyan at fax [phone].  Thanks

## 2023-09-24 ENCOUNTER — Telehealth (INDEPENDENT_AMBULATORY_CARE_PROVIDER_SITE_OTHER): Payer: Self-pay

## 2023-09-24 NOTE — Telephone Encounter (Signed)
 Thanks for the update

## 2023-09-24 NOTE — Telephone Encounter (Signed)
 Pharmacy Patient Advocate Encounter  Received notification from [facility] [employer] that Prior Authorization for Tremfya  200MG /2ML syringes has been APPROVED from 09-23-2023 to 09-22-2024   PA #/Case ID/Reference #: [id number]

## 2023-09-24 NOTE — Telephone Encounter (Signed)
 David Mooney patient daughter called to report patient is scheduled for first infusion on Friday 09/27/2023. Patient will need to use [facility] specialty pharmacy for the Tremfya  maintenance.

## 2023-09-24 NOTE — Therapy (Signed)
 OUTPATIENT PHYSICAL THERAPY LOWER EXTREMITY EVALUATION   Patient Name: David Mooney MRN: [id number] DOB:09/22/1940, 83 y.o., male Today's Date: 09/24/2023  END OF SESSION:   Past Medical History:  Diagnosis Date   Arthritis    Cataract    Complication of anesthesia    Embolus (HCC) 12/2022   left Hip   GERD (gastroesophageal reflux disease)    Glaucoma    Hernia of unspecified site of abdominal cavity without mention of obstruction or gangrene    History of gastric ulcer    History of kidney stones    PONV (postoperative nausea and vomiting)    Ulcerative colitis    Past Surgical History:  Procedure Laterality Date   BIOPSY  12/04/2018   Procedure: BIOPSY;  Surgeon: Golda Claudis PENNER, MD;  [facility];  Service: Endoscopy;;  cecum   COLONOSCOPY     COLONOSCOPY N/A 08/27/2012   Procedure: COLONOSCOPY;  Surgeon: Claudis PENNER Golda, MD;  [facility];  Service: Endoscopy;  Laterality: N/A;  1200-moved to 13:15 Ann to notify pt   COLONOSCOPY N/A 12/04/2018   Procedure: COLONOSCOPY;  Surgeon: Golda Claudis PENNER, MD;  [facility];  Service: Endoscopy;  Laterality: N/A;  155pm   CYSTOSCOPY WITH INSERTION OF UROLIFT N/A 04/30/2022   Procedure: CYSTOSCOPY WITH INSERTION OF UROLIFT;  Surgeon: Sherrilee Belvie CROME, MD;  Location: [facility];  Service: Urology;  Laterality: N/A;   ESOPHAGOGASTRODUODENOSCOPY N/A 07/09/2023   Procedure: EGD (ESOPHAGOGASTRODUODENOSCOPY);  Surgeon: Eartha Flavors, Toribio, MD;  [facility];  Service: Gastroenterology;  Laterality: N/A;  7:30AM;ASA 3   ESOPHAGOGASTRODUODENOSCOPY (EGD) WITH PROPOFOL   01/04/2023   Procedure: ESOPHAGOGASTRODUODENOSCOPY (EGD) WITH PROPOFOL ;  Surgeon: Cindie Carlin POUR, DO;  [facility];  Service: Endoscopy;;   ESOPHAGOGASTRODUODENOSCOPY (EGD) WITH PROPOFOL  N/A 04/09/2023   Procedure: ESOPHAGOGASTRODUODENOSCOPY (EGD) WITH PROPOFOL ;  Surgeon: Eartha Flavors Toribio, MD;  [facility];  Service: Gastroenterology;  Laterality: N/A;  7:30am;asa 3   HERNIA REPAIR Left    MOLE REMOVAL     From head , chest   UPPER GASTROINTESTINAL ENDOSCOPY     Patient Active Problem List   Diagnosis Date Noted   Crohn's ileocolitis (HCC) 09/12/2023   Peptic ulcer disease 08/15/2023   Small bowel obstruction (HCC) 08/15/2023   Right sided abdominal pain 08/14/2023   Crohn's disease of small intestine (HCC) 08/14/2023   SBO (small bowel obstruction) (HCC) 08/13/2023   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 07/09/2023   Acute esophagitis 04/09/2023   Duodenal ulcer 02/18/2023   Acute deep vein thrombosis (DVT) of femoral vein of left lower extremity (HCC) 01/11/2023   Esophagitis, unspecified with bleeding 01/04/2023   Abnormal CT of the abdomen 01/02/2023   Kidney stones 12/11/2022   Nocturia 03/28/2020   Urgency of urination 03/28/2020   Benign prostatic hyperplasia with urinary obstruction 03/28/2020   Anemia 10/22/2018   Fall 01/02/2018   GERD (gastroesophageal reflux disease) 09/15/2013   Flatulence 09/15/2013   Ulcerative colitis (HCC) 07/24/2011   Glaucoma 07/24/2011    PCP: Lari Elspeth BRAVO, MD  REFERRING PROVIDER: Lari Elspeth BRAVO, MD  REFERRING DIAG: R26.81 (ICD-10-CM) - Unsteady gait when walking M62.81 (ICD-10-CM) - Muscle weakness (generalized  THERAPY DIAG:  No diagnosis found.  Rationale for Evaluation and Treatment: Rehabilitation  ONSET DATE: ***  SUBJECTIVE:   SUBJECTIVE STATEMENT: ***  PERTINENT HISTORY: *** PAIN:  Are you having pain? {OPRCPAIN:27236}  PRECAUTIONS: {Therapy precautions:24002}  RED FLAGS: {PT Red  Flags:29287}   WEIGHT BEARING RESTRICTIONS: {Yes ***/No:24003}  FALLS:  Has patient fallen in last 6 months? {fallsyesno:27318}  LIVING ENVIRONMENT: Lives with: {OPRC lives with:25569::lives with their family} Lives in: {Lives in:25570} Stairs: {opstairs:27293} Has following  equipment at home: {Assistive devices:23999}  OCCUPATION: ***  PLOF: {PLOF:24004}  PATIENT GOALS: ***  NEXT MD VISIT: ***  OBJECTIVE:  Note: Objective measures were completed at Evaluation unless otherwise noted.  DIAGNOSTIC FINDINGS: ***  PATIENT SURVEYS:  {rehab surveys:24030}  COGNITION: Overall cognitive status: {cognition:24006}     SENSATION: {sensation:27233}  EDEMA:  {edema:24020}  MUSCLE LENGTH: Hamstrings: Right *** deg; Left *** deg Debby test: Right *** deg; Left *** deg  POSTURE: {posture:25561}  PALPATION: ***  LOWER EXTREMITY ROM:  {AROM/PROM:27142} ROM Right eval Left eval  Hip flexion    Hip extension    Hip abduction    Hip adduction    Hip internal rotation    Hip external rotation    Knee flexion    Knee extension    Ankle dorsiflexion    Ankle plantarflexion    Ankle inversion    Ankle eversion     (Blank rows = not tested)  LOWER EXTREMITY MMT:  MMT Right eval Left eval  Hip flexion    Hip extension    Hip abduction    Hip adduction    Hip internal rotation    Hip external rotation    Knee flexion    Knee extension    Ankle dorsiflexion    Ankle plantarflexion    Ankle inversion    Ankle eversion     (Blank rows = not tested)  LOWER EXTREMITY SPECIAL TESTS:  {LEspecialtests:26242}  FUNCTIONAL TESTS:  {Functional tests:24029}  GAIT: Distance walked: *** Assistive device utilized: {Assistive devices:23999} Level of assistance: {Levels of assistance:24026} Comments: ***                                                                                                                                TREATMENT DATE: 09/25/23 physical therapy evaluation and HEP instruction    PATIENT EDUCATION:  Education details: Patient educated on exam findings, POC, scope of PT, HEP, and ***. Person educated: Patient Education method: Explanation, Demonstration, and Handouts Education comprehension: verbalized  understanding, returned demonstration, verbal cues required, and tactile cues required  HOME EXERCISE PROGRAM: ***  ASSESSMENT:  CLINICAL IMPRESSION: Patient is a 83 y.o. male who was seen today for physical therapy evaluation and treatment for R26.81 (ICD-10-CM) - Unsteady gait when walking M62.81 (ICD-10-CM) - Muscle weakness (generalized.   OBJECTIVE IMPAIRMENTS: {opptimpairments:25111}.   ACTIVITY LIMITATIONS: {activitylimitations:27494}  PARTICIPATION LIMITATIONS: {participationrestrictions:25113}  PERSONAL FACTORS: {Personal factors:25162} are also affecting patient's functional outcome.   REHAB POTENTIAL: Good  CLINICAL DECISION MAKING: Evolving/moderate complexity  EVALUATION COMPLEXITY: Moderate   GOALS: Goals reviewed with patient? No  SHORT TERM GOALS: Target date: *** *** Baseline: Goal status: INITIAL  2.  *** Baseline:  Goal status: INITIAL  3.  *** Baseline:  Goal status: INITIAL  4.  *** Baseline:  Goal status: INITIAL  5.  *** Baseline:  Goal status: INITIAL  6.  *** Baseline:  Goal status: INITIAL  LONG TERM GOALS: Target date: ***  *** Baseline:  Goal status: INITIAL  2.  *** Baseline:  Goal status: INITIAL  3.  *** Baseline:  Goal status: INITIAL  4.  *** Baseline:  Goal status: INITIAL  5.  *** Baseline:  Goal status: INITIAL  6.  *** Baseline:  Goal status: INITIAL   PLAN:  PT FREQUENCY: {rehab frequency:25116}  PT DURATION: {rehab duration:25117}  PLANNED INTERVENTIONS: 97164- PT Re-evaluation, 97110-Therapeutic exercises, 97530- Therapeutic activity, 97112- Neuromuscular re-education, 97535- Self Care, 02859- Manual therapy, [id number]- Gait training, [id number]- Orthotic Fit/training, [id number]- Canalith repositioning, [id number]- Aquatic Therapy, 97760- Splinting, 97597- Wound care (first 20 sq cm), 97598- Wound care (each additional 20 sq cm)Patient/Family education, Balance training, Stair training, Taping, Dry Needling,  Joint mobilization, Joint manipulation, Spinal manipulation, Spinal mobilization, Scar mobilization, and DME instructions.   PLAN FOR NEXT SESSION: Review HEP and goals    10:54 AM, 09/24/23 Waymon Laser Small Tamira Ryland MPT [facility] [place] [phone]

## 2023-09-24 NOTE — Telephone Encounter (Signed)
 Patient daughter wanted to give an update/FYI. She says the patient is taking a prednisone  taper of 15 mg daily,and yesterday around 3 am patient got sick on his stomach and yesterday evening had an episode of fecal incontinence due to diarrhea, that he says was sudden. Patient feels good today and denies any abdominal pain, dark or bloody stools. I advised this could have been a transient thing to let us  know if it continues. She says the patient first infusion of Tremfya  is scheduled for 09/27/2023 at the infusion center. Patient daughter says she just wanted this documented. I advised I would notify the doctor, but if continued to please let us  know. She states understanding.

## 2023-09-25 ENCOUNTER — Other Ambulatory Visit: Payer: Self-pay

## 2023-09-25 ENCOUNTER — Ambulatory Visit (HOSPITAL_COMMUNITY): Attending: Family Medicine

## 2023-09-25 DIAGNOSIS — M6281 Muscle weakness (generalized): Secondary | ICD-10-CM | POA: Diagnosis not present

## 2023-09-25 DIAGNOSIS — R2689 Other abnormalities of gait and mobility: Secondary | ICD-10-CM | POA: Diagnosis not present

## 2023-09-25 DIAGNOSIS — R2681 Unsteadiness on feet: Secondary | ICD-10-CM | POA: Diagnosis not present

## 2023-09-25 DIAGNOSIS — R262 Difficulty in walking, not elsewhere classified: Secondary | ICD-10-CM | POA: Diagnosis not present

## 2023-09-26 ENCOUNTER — Encounter (INDEPENDENT_AMBULATORY_CARE_PROVIDER_SITE_OTHER): Payer: Self-pay | Admitting: *Deleted

## 2023-09-26 ENCOUNTER — Encounter (HOSPITAL_COMMUNITY)
Admission: RE | Admit: 2023-09-26 | Discharge: 2023-09-26 | Disposition: A | Discharge status: Home / Self Care | Source: Ambulatory Visit | Attending: Gastroenterology | Admitting: Gastroenterology

## 2023-09-27 ENCOUNTER — Encounter: Attending: Gastroenterology | Admitting: *Deleted

## 2023-09-27 VITALS — BP 134/71 | HR 68 | Temp 97.5°F | Resp 16

## 2023-09-27 DIAGNOSIS — K50818 Crohn's disease of both small and large intestine with other complication: Secondary | ICD-10-CM

## 2023-09-27 MED ORDER — SODIUM CHLORIDE 0.9 % IV SOLN
200.0000 mg | Freq: Once | INTRAVENOUS | Status: AC
  Administered 2023-09-27: 200 mg via INTRAVENOUS
  Filled 2023-09-27: qty 20

## 2023-09-27 NOTE — Progress Notes (Signed)
 Diagnosis: Crohn's Disease  Provider:  Eartha Sieving MD  Procedure: IV Infusion  IV Type: Peripheral, IV Location: L Antecubital  Tremfya , Dose: 200 mg  Infusion Start Time: 1044  Infusion Stop Time: 1145  Post Infusion IV Care: Observation period completed  Discharge: Condition: Good, Destination: Home . AVS Provided  Performed by:  Baldwin Darice Helling, RN

## 2023-10-01 ENCOUNTER — Ambulatory Visit (HOSPITAL_BASED_OUTPATIENT_CLINIC_OR_DEPARTMENT_OTHER): Admitting: Anesthesiology

## 2023-10-01 ENCOUNTER — Encounter (HOSPITAL_COMMUNITY): Payer: Self-pay | Admitting: Gastroenterology

## 2023-10-01 ENCOUNTER — Ambulatory Visit (HOSPITAL_COMMUNITY): Admitting: Anesthesiology

## 2023-10-01 ENCOUNTER — Encounter (HOSPITAL_COMMUNITY)
Admission: RE | Disposition: A | Discharge status: Home / Self Care | Payer: Self-pay | Source: Home / Self Care | Attending: Gastroenterology

## 2023-10-01 ENCOUNTER — Ambulatory Visit (HOSPITAL_COMMUNITY)
Admission: RE | Admit: 2023-10-01 | Discharge: 2023-10-01 | Disposition: A | Discharge status: Home / Self Care | Attending: Gastroenterology | Admitting: Gastroenterology

## 2023-10-01 ENCOUNTER — Other Ambulatory Visit: Payer: Self-pay

## 2023-10-01 ENCOUNTER — Encounter (INDEPENDENT_AMBULATORY_CARE_PROVIDER_SITE_OTHER): Payer: Self-pay | Admitting: *Deleted

## 2023-10-01 DIAGNOSIS — K633 Ulcer of intestine: Secondary | ICD-10-CM | POA: Diagnosis not present

## 2023-10-01 DIAGNOSIS — K5080 Crohn's disease of both small and large intestine without complications: Secondary | ICD-10-CM | POA: Insufficient documentation

## 2023-10-01 DIAGNOSIS — Z7901 Long term (current) use of anticoagulants: Secondary | ICD-10-CM | POA: Diagnosis not present

## 2023-10-01 DIAGNOSIS — I10 Essential (primary) hypertension: Secondary | ICD-10-CM

## 2023-10-01 DIAGNOSIS — K219 Gastro-esophageal reflux disease without esophagitis: Secondary | ICD-10-CM | POA: Insufficient documentation

## 2023-10-01 DIAGNOSIS — Z86718 Personal history of other venous thrombosis and embolism: Secondary | ICD-10-CM | POA: Diagnosis not present

## 2023-10-01 DIAGNOSIS — K648 Other hemorrhoids: Secondary | ICD-10-CM

## 2023-10-01 HISTORY — PX: COLONOSCOPY: SHX5424

## 2023-10-01 LAB — HM COLONOSCOPY

## 2023-10-01 SURGERY — COLONOSCOPY
Anesthesia: Monitor Anesthesia Care

## 2023-10-01 MED ORDER — LACTATED RINGERS IV SOLN: INTRAVENOUS | Status: DC | PRN

## 2023-10-01 MED ORDER — PROPOFOL 10 MG/ML IV BOLUS
INTRAVENOUS | Status: AC
  Filled 2023-10-01: qty 20

## 2023-10-01 MED ORDER — PROPOFOL 10 MG/ML IV BOLUS
INTRAVENOUS | Status: DC | PRN
Start: 2023-10-01 — End: 2023-10-01
  Administered 2023-10-01 (×2): 50 mg via INTRAVENOUS
  Administered 2023-10-01: 100 mg via INTRAVENOUS
  Administered 2023-10-01: 50 mg via INTRAVENOUS

## 2023-10-01 MED ORDER — LACTATED RINGERS IV SOLN: INTRAVENOUS | Status: DC

## 2023-10-01 NOTE — H&P (Addendum)
 David Mooney is an 83 y.o. male.   Chief Complaint: Crohn's disease. HPI: DEDRICK HEFFNER is a 83 y.o. 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   The patient denies having any nausea, vomiting, fever, chills, hematochezia, melena, hematemesis, abdominal distention, abdominal pain, diarrhea, jaundice, pruritus or weight loss.   Past Medical History:  Diagnosis Date   Arthritis    Cataract    Complication of anesthesia    Embolus (HCC) 12/2022   left Hip   GERD (gastroesophageal reflux disease)    Glaucoma    Hernia of unspecified site of abdominal cavity without mention of obstruction or gangrene    History of gastric ulcer    History of kidney stones    PONV (postoperative nausea and vomiting)    Ulcerative colitis     Past Surgical History:  Procedure Laterality Date   BIOPSY  12/04/2018   Procedure: BIOPSY;  Surgeon: Golda Claudis PENNER, MD;  [facility];  Service: Endoscopy;;  cecum   COLONOSCOPY     COLONOSCOPY N/A 08/27/2012   Procedure: COLONOSCOPY;  Surgeon: Claudis PENNER Golda, MD;  [facility];  Service: Endoscopy;  Laterality: N/A;  1200-moved to 13:15 Ann to notify pt   COLONOSCOPY N/A 12/04/2018   Procedure: COLONOSCOPY;  Surgeon: Golda Claudis PENNER, MD;  [facility];  Service: Endoscopy;  Laterality: N/A;  155pm   CYSTOSCOPY WITH INSERTION OF UROLIFT N/A 04/30/2022   Procedure: CYSTOSCOPY WITH INSERTION OF UROLIFT;  Surgeon: Sherrilee Belvie CROME, MD;  Location: [facility];  Service: Urology;  Laterality: N/A;   ESOPHAGOGASTRODUODENOSCOPY N/A 07/09/2023   Procedure: EGD (ESOPHAGOGASTRODUODENOSCOPY);  Surgeon: Eartha Flavors, Toribio, MD;  [facility];  Service: Gastroenterology;  Laterality: N/A;  7:30AM;ASA 3   ESOPHAGOGASTRODUODENOSCOPY (EGD) WITH PROPOFOL   01/04/2023    Procedure: ESOPHAGOGASTRODUODENOSCOPY (EGD) WITH PROPOFOL ;  Surgeon: Cindie Carlin POUR, DO;  [facility];  Service: Endoscopy;;   ESOPHAGOGASTRODUODENOSCOPY (EGD) WITH PROPOFOL  N/A 04/09/2023   Procedure: ESOPHAGOGASTRODUODENOSCOPY (EGD) WITH PROPOFOL ;  Surgeon: Eartha Flavors Toribio, MD;  [facility];  Service: Gastroenterology;  Laterality: N/A;  7:30am;asa 3   HERNIA REPAIR Left    MOLE REMOVAL     From head , chest   UPPER GASTROINTESTINAL ENDOSCOPY      Family History  Problem Relation Age of Onset   Arrhythmia Mother    Anuerysm Father    Hypertension Father    Diabetes Father    Healthy Sister    Healthy Sister    Irritable bowel syndrome Daughter    Thyroid  disease Daughter    Social History:  reports that he has never smoked. He has never used smokeless tobacco. He reports that he does not drink alcohol  and does not use drugs.  Allergies: No Known Allergies  Medications Prior to Admission  Medication Sig Dispense Refill   atorvastatin (LIPITOR) 20 MG tablet Take 20 mg by mouth daily.     esomeprazole  (NEXIUM ) 40 MG capsule Take 1 capsule (40 mg total) by mouth 2 (two) times daily before a meal. 60 capsule 5   finasteride  (PROSCAR ) 5 MG tablet Take 1 tablet (5 mg total) by mouth daily. TAKE 1 TABLET(5 MG) BY MOUTH DAILY 90 tablet 3   mesalamine  (LIALDA ) 1.2 g EC tablet TAKE 2 TABLETS(2.4 GRAMS) BY MOUTH TWICE DAILY 360 tablet 3  mirabegron  ER (MYRBETRIQ ) 50 MG TB24 tablet Take 1 tablet (50 mg total) by mouth daily. 30 tablet 5   Multiple Vitamin (MULTIVITAMIN) tablet Take 1 tablet by mouth daily.     predniSONE  (DELTASONE ) 5 MG tablet Take 8 tablets (40 mg total) by mouth daily with breakfast for 7 days, THEN 7 tablets (35 mg total) daily with breakfast for 7 days, THEN 6 tablets (30 mg total) daily with breakfast for 7 days, THEN 5 tablets (25 mg total) daily with breakfast for 7 days, THEN 4 tablets (20 mg total) daily with breakfast for 7 days,  THEN 3 tablets (15 mg total) daily with breakfast for 7 days, THEN 2 tablets (10 mg total) daily with breakfast for 7 days, THEN 1 tablet (5 mg total) daily with breakfast for 7 days. (Patient taking differently: Take 8 tablets (40 mg total) by mouth daily with breakfast for 7 days, THEN 7 tablets (35 mg total) daily with breakfast for 7 days, THEN 6 tablets (30 mg total) daily with breakfast for 7 days, THEN 5 tablets (25 mg total) daily with breakfast for 7 days, THEN 4 tablets (20 mg total) daily with breakfast for 7 days, THEN 3 tablets (15 mg total) daily with breakfast for 7 days, THEN 2 tablets (10 mg total) daily with breakfast for 7 days, THEN 1 tablet (5 mg total) daily with breakfast for 7 days.currently taking 40 mg every day as of 08/22/2023.) 252 tablet 0   tamsulosin  (FLOMAX ) 0.4 MG CAPS capsule Take 1 capsule (0.4 mg total) by mouth daily. 30 capsule 11   [START ON 12/19/2023] TREMFYA  PEN 200 MG/2ML SOAJ Inject 200 mg into the skin every 28 (twenty-eight) days. Start Tremfya  pen around the week of December 4th or later. Needs to be given 4 weeks after the last infusion 2 mL 12   acetaminophen  (TYLENOL ) 650 MG CR tablet Take 1,300 mg by mouth every 8 (eight) hours as needed for pain.     Guaifenesin (MUCINEX MAXIMUM STRENGTH) 1200 MG TB12 Take 1 tablet by mouth 2 (two) times daily as needed (chest congestion).     sucralfate  (CARAFATE ) 1 GM/10ML suspension Take 10 mLs (1 g total) by mouth 4 (four) times daily -  with meals and at bedtime. 2400 mL 1    No results found for this or any previous visit (from the past 48 hours). No results found.  Review of Systems  All other systems reviewed and are negative.   Blood pressure (!) 150/90, pulse 90, temperature 98.2 F (36.8 C), temperature source Oral, resp. rate (!) 9, SpO2 100%. Physical Exam  GENERAL: The patient is AO x3, in no acute distress. HEENT: Head is normocephalic and atraumatic. EOMI are intact. Mouth is well hydrated and  without lesions. NECK: Supple. No masses LUNGS: Clear to auscultation. No presence of rhonchi/wheezing/rales. Adequate chest expansion HEART: RRR, normal s1 and s2. ABDOMEN: Soft, nontender, no guarding, no peritoneal signs, and nondistended. BS +. No masses. EXTREMITIES: Without any cyanosis, clubbing, rash, lesions or edema. NEUROLOGIC: AOx3, no focal motor deficit. SKIN: no jaundice, no rashes  Assessment/Plan TALIB HEADLEY is a 83 y.o. 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  Will proceed with colonoscopy.  Toribio Eartha Flavors, MD 10/01/2023, 9:26 AM

## 2023-10-01 NOTE — Anesthesia Preprocedure Evaluation (Signed)
 Anesthesia Evaluation  Patient identified by MRN, date of birth, ID band Patient awake    Reviewed: Allergy & Precautions, H&P , NPO status , Patient's Chart, lab work & pertinent test results, reviewed documented beta blocker date and time   History of Anesthesia Complications (+) PONV and history of anesthetic complications  Airway Mallampati: II  TM Distance: >3 FB Neck ROM: full    Dental no notable dental hx.    Pulmonary neg pulmonary ROS   Pulmonary exam normal breath sounds clear to auscultation       Cardiovascular Exercise Tolerance: Good hypertension, negative cardio ROS  Rhythm:regular Rate:Normal     Neuro/Psych negative neurological ROS  negative psych ROS   GI/Hepatic Neg liver ROS, PUD,GERD  ,,  Endo/Other  negative endocrine ROS    Renal/GU Renal disease  negative genitourinary   Musculoskeletal   Abdominal   Peds  Hematology  (+) Blood dyscrasia, anemia   Anesthesia Other Findings   Reproductive/Obstetrics negative OB ROS                              Anesthesia Physical Anesthesia Plan  ASA: 3  Anesthesia Plan: General   Post-op Pain Management:    Induction:   PONV Risk Score and Plan: Propofol  infusion  Airway Management Planned:   Additional Equipment:   Intra-op Plan:   Post-operative Plan:   Informed Consent: I have reviewed the patients History and Physical, chart, labs and discussed the procedure including the risks, benefits and alternatives for the proposed anesthesia with the patient or authorized representative who has indicated his/her understanding and acceptance.     Dental Advisory Given  Plan Discussed with: CRNA  Anesthesia Plan Comments:         Anesthesia Quick Evaluation

## 2023-10-01 NOTE — Transfer of Care (Signed)
 Immediate Anesthesia Transfer of Care Note  Patient: David Mooney  Procedure(s) Performed: COLONOSCOPY  Patient Location: PACU  Anesthesia Type:MAC  Level of Consciousness: awake and drowsy  Airway & Oxygen Therapy: Patient Spontanous Breathing  Post-op Assessment: Report given to RN, Post -op Vital signs reviewed and stable, and Patient moving all extremities  Post vital signs: Reviewed and stable  Last Vitals:  Vitals Value Taken Time  BP 134/78 10/01/23 10:11  Temp 36.6 C 10/01/23 10:11  Pulse 71 10/01/23 10:11  Resp 16 10/01/23 10:11  SpO2 100 % 10/01/23 10:11    Last Pain:  Vitals:   10/01/23 1011  TempSrc: Oral  PainSc: 0-No pain      Patients Stated Pain Goal: 5 (10/01/23 0904)  Complications: No notable events documented.

## 2023-10-01 NOTE — Op Note (Signed)
 [facility] Patient Name: David Mooney Procedure Date: 10/01/2023 8:41 AM MRN: [id number] Date of Birth: 11/20/1940 Attending MD: Toribio Fortune , , [id number] CSN: [id number] Age: 83 Admit Type: Outpatient Procedure:                Colonoscopy Indications:              Crohn's disease of the small bowel and colon Providers:                Toribio Fortune, Crystal Page, Daphne Mulch                            Technician, Technician Referring MD:              Medicines:                Monitored Anesthesia Care Complications:            No immediate complications. Estimated Blood Loss:     Estimated blood loss: none. Procedure:                Pre-Anesthesia Assessment:                           - Prior to the procedure, a History and Physical                            was performed, and patient medications, allergies                            and sensitivities were reviewed. The patient's                            tolerance of previous anesthesia was reviewed.                           - The risks and benefits of the procedure and the                            sedation options and risks were discussed with the                            patient. All questions were answered and informed                            consent was obtained.                           - ASA Grade Assessment: III - A patient with severe                            systemic disease.                           After obtaining informed consent, the colonoscope                            was passed under direct vision. Throughout the  procedure, the patient's blood pressure, pulse, and                            oxygen saturations were monitored continuously. The                            [id number]-[id number] ([id number]) Peds Colon was introduced                            through the anus and advanced to the the terminal                            ileum. The colonoscopy was performed  without                            difficulty. The patient tolerated the procedure                            well. The quality of the bowel preparation was fair. Scope In: 9:40:51 AM Scope Out: 10:07:24 AM Scope Withdrawal Time: 0 hours 15 minutes 4 seconds  Total Procedure Duration: 0 hours 26 minutes 33 seconds  Findings:      The perianal and digital rectal examinations were normal.      The terminal ileum contained a few localized non-bleeding erosions.       Biopsies were taken with a cold forceps for histology.      The colon (entire examined portion) appeared normal. Biopsies were taken       with a cold forceps for histology (cecum and rectum).      Non-bleeding internal hemorrhoids were found during retroflexion. The       hemorrhoids were small. Impression:               - Preparation of the colon was fair.                           - A few erosions in the terminal ileum. Biopsied.                           - The entire examined colon is normal. Biopsied.                           - Non-bleeding internal hemorrhoids. Moderate Sedation:      Per Anesthesia Care Recommendation:           - Discharge patient to home (ambulatory).                           - Resume previous diet.                           - Await pathology results.                           - May consider repeat colonoscopy vs CT  enterography depending on clinical progress with                            Tremfya . Procedure Code(s):        --- Professional ---                           [phone], Colonoscopy, flexible; with biopsy, single                            or multiple Diagnosis Code(s):        --- Professional ---                           K64.8, Other hemorrhoids                           K63.3, Ulcer of intestine                           K50.80, Crohn's disease of both small and large                            intestine without complications CPT copyright 2022 American  Medical Association. All rights reserved. The codes documented in this report are preliminary and upon coder review may  be revised to meet current compliance requirements. Toribio Fortune, MD Toribio Fortune,  10/01/2023 10:15:23 AM This report has been signed electronically. Number of Addenda: 0

## 2023-10-01 NOTE — Discharge Instructions (Signed)
You are being discharged to home.  Resume your previous diet.  We are waiting for your pathology results.  

## 2023-10-01 NOTE — OR Nursing (Signed)
 Wedding ring gave to wife by husband.

## 2023-10-01 NOTE — Anesthesia Postprocedure Evaluation (Signed)
 Anesthesia Post Note  Patient: David Mooney  Procedure(s) Performed: COLONOSCOPY  Patient location during evaluation: Phase II Anesthesia Type: MAC Level of consciousness: awake Pain management: pain level controlled Vital Signs Assessment: post-procedure vital signs reviewed and stable Respiratory status: spontaneous breathing and respiratory function stable Cardiovascular status: blood pressure returned to baseline and stable Postop Assessment: no headache and no apparent nausea or vomiting Anesthetic complications: no Comments: Late entry   No notable events documented.   Last Vitals:  Vitals:   10/01/23 0904 10/01/23 1011  BP: (!) 150/90 134/78  Pulse: 90 71  Resp: (!) 9 16  Temp: 36.8 C 36.6 C  SpO2: 100% 100%    Last Pain:  Vitals:   10/01/23 1011  TempSrc: Oral  PainSc: 0-No pain                 [name]

## 2023-10-02 LAB — SURGICAL PATHOLOGY

## 2023-10-03 ENCOUNTER — Ambulatory Visit (INDEPENDENT_AMBULATORY_CARE_PROVIDER_SITE_OTHER): Payer: Self-pay | Admitting: Gastroenterology

## 2023-10-03 ENCOUNTER — Encounter (HOSPITAL_COMMUNITY): Payer: Self-pay | Admitting: Gastroenterology

## 2023-10-04 NOTE — Telephone Encounter (Signed)
 Induction: Week 0 = 1st dose of IV infusion 09/27/2023 Week 4 = 2nd dose of IV infusion 10/25/2023 Week 8 = 3rd dose of IV infusion 11/22/2023  Maintenance: Starts on 12/20/2023 (need to send to the [facility] specialty pharmacy) after the 11/22/2023 IV infusion.

## 2023-10-10 ENCOUNTER — Other Ambulatory Visit (HOSPITAL_COMMUNITY): Payer: Self-pay

## 2023-10-11 NOTE — Progress Notes (Signed)
 Patient result letter mailed procedure note and pathology result faxed to PCP

## 2023-10-12 ENCOUNTER — Other Ambulatory Visit (INDEPENDENT_AMBULATORY_CARE_PROVIDER_SITE_OTHER): Payer: Self-pay | Admitting: Gastroenterology

## 2023-10-12 DIAGNOSIS — K269 Duodenal ulcer, unspecified as acute or chronic, without hemorrhage or perforation: Secondary | ICD-10-CM

## 2023-10-14 NOTE — Telephone Encounter (Signed)
 My message to David Mooney at [facility], So, on this patient, I still need to send to a specialty pharmacy and do the PA on the maintenance? I just want to check as I see the script were on hold for both of these patient's to be filled at [facility].   Still awaiting response from David Mooney at [facility] specialty pharmacy.

## 2023-10-14 NOTE — Telephone Encounter (Signed)
 Per  Bella JONETTA Agent, RPH to my knowledge. You guys are to do the authorization for the maintenance medications but I'll add Yatin to ensure that I am telling you appropriately.

## 2023-10-14 NOTE — Telephone Encounter (Signed)
 I have reached out to [facility] specialty pharmacist James Teldrin, [facility] to see if he is handling the maintenance dosing for the patient Tremfya . Awaiting to hear from him regarding this patient.

## 2023-10-15 NOTE — Telephone Encounter (Signed)
 Per Lynwood with Specialty pharmacy,  per Rosina Fails: mrn [phone] is still in infusion process. had first infusion on 9-12. his maintenance is approved already. he has tenative infusions for 10.3 and 10.31, leading to onboarding call around 11-14.

## 2023-10-16 ENCOUNTER — Encounter (INDEPENDENT_AMBULATORY_CARE_PROVIDER_SITE_OTHER): Payer: Self-pay | Admitting: Gastroenterology

## 2023-10-18 NOTE — Telephone Encounter (Signed)
 I spoke with the patient daughter Eleanor Farrow [phone] and made her aware that per [facility] specialty pharmacy,  Per Lynwood with Specialty pharmacy,  per Rosina Fails: mrn [phone] is still in infusion process. had first infusion on 9-12. his maintenance is approved already. he has tenative infusions for 10.3 and 10.31, leading to onboarding call around 11-14.    I did give her the phone number [phone] to [facility] pharmacy and told her to reach out to them after patient's last infusion to ensure they have all that they need to process and mail the maintenance doses to the patient prior the need of starting the Maintenance . I asked that she please let us  know if any issues. She states understanding.

## 2023-10-23 ENCOUNTER — Encounter: Payer: Self-pay | Admitting: Urology

## 2023-10-23 ENCOUNTER — Ambulatory Visit: Admitting: Urology

## 2023-10-23 VITALS — BP 122/68 | HR 83

## 2023-10-23 DIAGNOSIS — N401 Enlarged prostate with lower urinary tract symptoms: Secondary | ICD-10-CM | POA: Diagnosis not present

## 2023-10-23 DIAGNOSIS — R3915 Urgency of urination: Secondary | ICD-10-CM | POA: Diagnosis not present

## 2023-10-23 DIAGNOSIS — N138 Other obstructive and reflux uropathy: Secondary | ICD-10-CM | POA: Diagnosis not present

## 2023-10-23 DIAGNOSIS — R351 Nocturia: Secondary | ICD-10-CM | POA: Diagnosis not present

## 2023-10-23 LAB — URINALYSIS, ROUTINE W REFLEX MICROSCOPIC
Bilirubin, UA: NEGATIVE
Glucose, UA: NEGATIVE
Ketones, UA: NEGATIVE
Nitrite, UA: NEGATIVE
Protein,UA: NEGATIVE
Specific Gravity, UA: 1.015 (ref 1.005–1.030)
Urobilinogen, Ur: 0.2 mg/dL (ref 0.2–1.0)
pH, UA: 6 (ref 5.0–7.5)

## 2023-10-23 LAB — BLADDER SCAN AMB NON-IMAGING: Scan Result: 216

## 2023-10-23 LAB — MICROSCOPIC EXAMINATION: Bacteria, UA: NONE SEEN

## 2023-10-23 MED ORDER — FINASTERIDE 5 MG PO TABS: 5.0000 mg | ORAL_TABLET | Freq: Every day | ORAL | 3 refills | Status: DC

## 2023-10-23 MED ORDER — MIRABEGRON ER 50 MG PO TB24: 50.0000 mg | ORAL_TABLET | Freq: Every day | ORAL | 11 refills | Status: DC

## 2023-10-23 MED ORDER — TAMSULOSIN HCL 0.4 MG PO CAPS: 0.4000 mg | ORAL_CAPSULE | Freq: Every day | ORAL | 11 refills | Status: DC

## 2023-10-23 NOTE — Progress Notes (Signed)
 Bladder Scan completed today.  Patient can void prior to the bladder scan. Bladder scan result: 216  Performed By: [facility] LPN

## 2023-10-23 NOTE — Progress Notes (Signed)
 10/23/2023 10:42 AM   David Mooney [birth year] [id number]  Referring provider: Lari Elspeth BRAVO, MD [address] [place],  [place] [address]  No chief complaint on file.   HPI: [name] is a 83yo here for followup for BPH, urinary urgency and nocturia. IPSS 10 QOL 2 on flomax  0.4mg  daily, mirabegron  50mg  daily and finasteride  5mg  daily. Nocturia 2x. He notes his urgency improved with increasing mirabegron  to 50mg . Nocturia 1-2x depending on fluid consumption.     PMH: Past Medical History:  Diagnosis Date   Arthritis    Cataract    Complication of anesthesia    Embolus (HCC) 12/2022   left Hip   GERD (gastroesophageal reflux disease)    Glaucoma    Hernia of unspecified site of abdominal cavity without mention of obstruction or gangrene    History of gastric ulcer    History of kidney stones    PONV (postoperative nausea and vomiting)    Ulcerative colitis     Surgical History: Past Surgical History:  Procedure Laterality Date   BIOPSY  12/04/2018   Procedure: BIOPSY;  Surgeon: Golda Claudis PENNER, MD;  [facility];  Service: Endoscopy;;  cecum   COLONOSCOPY     COLONOSCOPY N/A 08/27/2012   Procedure: COLONOSCOPY;  Surgeon: Claudis PENNER Golda, MD;  Location: [facility];  Service: Endoscopy;  Laterality: N/A;  1200-moved to 13:15 Ann to notify pt   COLONOSCOPY N/A 12/04/2018   Procedure: COLONOSCOPY;  Surgeon: Golda Claudis PENNER, MD;  Location: [facility];  Service: Endoscopy;  Laterality: N/A;  155pm   COLONOSCOPY N/A 10/01/2023   Procedure: COLONOSCOPY;  Surgeon: Eartha Angelia Sieving, MD;  Location: [facility];  Service: Gastroenterology;  Laterality: N/A;  8:15 am, asa 3   CYSTOSCOPY WITH INSERTION OF UROLIFT N/A 04/30/2022   Procedure: CYSTOSCOPY WITH INSERTION OF UROLIFT;  Surgeon: Sherrilee Belvie CROME, MD;  Location: [facility];  Service: Urology;  Laterality: N/A;   ESOPHAGOGASTRODUODENOSCOPY N/A 07/09/2023   Procedure: EGD (ESOPHAGOGASTRODUODENOSCOPY);   Surgeon: Eartha Angelia, Sieving, MD;  Location: [facility];  Service: Gastroenterology;  Laterality: N/A;  7:30AM;ASA 3   ESOPHAGOGASTRODUODENOSCOPY (EGD) WITH PROPOFOL   01/04/2023   Procedure: ESOPHAGOGASTRODUODENOSCOPY (EGD) WITH PROPOFOL ;  Surgeon: Cindie Carlin POUR, DO;  [facility];  Service: Endoscopy;;   ESOPHAGOGASTRODUODENOSCOPY (EGD) WITH PROPOFOL  N/A 04/09/2023   Procedure: ESOPHAGOGASTRODUODENOSCOPY (EGD) WITH PROPOFOL ;  Surgeon: Eartha Angelia Sieving, MD;  [facility];  Service: Gastroenterology;  Laterality: N/A;  7:30am;asa 3   HERNIA REPAIR Left    MOLE REMOVAL     From head , chest   UPPER GASTROINTESTINAL ENDOSCOPY      Home Medications:  Allergies as of 10/23/2023   No Known Allergies      Medication List        Accurate as of October 23, 2023 10:42 AM. If you have any questions, ask your nurse or doctor.          acetaminophen  650 MG CR tablet Commonly known as: TYLENOL  Take 1,300 mg by mouth every 8 (eight) hours as needed for pain.   atorvastatin 20 MG tablet Commonly known as: LIPITOR Take 20 mg by mouth daily.   esomeprazole  40 MG capsule Commonly known as: NexIUM  Take 1 capsule (40 mg total) by mouth 2 (two) times daily before a meal.   finasteride  5 MG tablet Commonly known as: PROSCAR  Take 1 tablet (5 mg total) by mouth daily. TAKE 1 TABLET(5 MG)  BY MOUTH DAILY   mesalamine  1.2 g EC tablet Commonly known as: LIALDA  TAKE 2 TABLETS(2.4 GRAMS) BY MOUTH TWICE DAILY   mirabegron  ER 50 MG Tb24 tablet Commonly known as: MYRBETRIQ  Take 1 tablet (50 mg total) by mouth daily.   Mucinex Maximum Strength 1200 MG Tb12 Generic drug: Guaifenesin Take 1 tablet by mouth 2 (two) times daily as needed (chest congestion).   multivitamin tablet Take 1 tablet by mouth daily.   sucralfate  1 GM/10ML suspension Commonly known as: CARAFATE  SHAKE LIQUID AND TAKE 10 ML(1 GRAM) BY MOUTH FOUR TIMES DAILY(WITH MEALS AND AT  BEDTIME)   tamsulosin  0.4 MG Caps capsule Commonly known as: FLOMAX  Take 1 capsule (0.4 mg total) by mouth daily.   Tremfya  Pen 200 MG/2ML Soaj Generic drug: Guselkumab  Inject 200 mg into the skin every 28 (twenty-eight) days. Start Tremfya  pen around the week of December 4th or later. Needs to be given 4 weeks after the last infusion Start taking on: December 19, 2023        Allergies: No Known Allergies  Family History: Family History  Problem Relation Age of Onset   Arrhythmia Mother    Anuerysm Father    Hypertension Father    Diabetes Father    Healthy Sister    Healthy Sister    Irritable bowel syndrome Daughter    Thyroid  disease Daughter     Social History:  reports that he has never smoked. He has never used smokeless tobacco. He reports that he does not drink alcohol  and does not use drugs.  ROS: All other review of systems were reviewed and are negative except what is noted above in HPI  Physical Exam: BP 122/68   Pulse 83   Constitutional:  Alert and oriented, No acute distress. HEENT: [place] AT, moist mucus membranes.  Trachea midline, no masses. Cardiovascular: No clubbing, cyanosis, or edema. Respiratory: Normal respiratory effort, no increased work of breathing. GI: Abdomen is soft, nontender, nondistended, no abdominal masses GU: No CVA tenderness.  Lymph: No cervical or inguinal lymphadenopathy. Skin: No rashes, bruises or suspicious lesions. Neurologic: Grossly intact, no focal deficits, moving all 4 extremities. Psychiatric: Normal mood and affect.  Laboratory Data: Lab Results  Component Value Date   WBC 12.8 (H) 08/16/2023   HGB 11.6 (L) 08/16/2023   HCT 35.0 (L) 08/16/2023   MCV 88.6 08/16/2023   PLT 181 08/16/2023    Lab Results  Component Value Date   CREATININE 0.99 08/16/2023    No results found for: PSA  No results found for: TESTOSTERONE  No results found for: HGBA1C  Urinalysis    Component Value Date/Time    COLORURINE STRAW (A) 08/13/2023 1805   APPEARANCEUR CLEAR 08/13/2023 1805   APPEARANCEUR Clear 07/23/2023 0937   LABSPEC 1.035 (H) 08/13/2023 1805   PHURINE 6.0 08/13/2023 1805   GLUCOSEU NEGATIVE 08/13/2023 1805   HGBUR NEGATIVE 08/13/2023 1805   BILIRUBINUR NEGATIVE 08/13/2023 1805   BILIRUBINUR Negative 07/23/2023 0937   KETONESUR NEGATIVE 08/13/2023 1805   PROTEINUR NEGATIVE 08/13/2023 1805   NITRITE NEGATIVE 08/13/2023 1805   LEUKOCYTESUR NEGATIVE 08/13/2023 1805    Lab Results  Component Value Date   LABMICR Comment 07/23/2023   WBCUA 6-10 (A) 06/19/2022   LABEPIT None seen 06/19/2022   MUCUS Present (A) 02/09/2022   BACTERIA RARE (A) 12/31/2022    Pertinent Imaging:  Results for orders placed during the hospital encounter of 07/22/23  DG Abd 1 View  Narrative CLINICAL DATA:  History of kidney  stones.  EXAM: ABDOMEN - 1 VIEW  COMPARISON:  01/23/2023  FINDINGS: No visualized urolithiasis. Calcification in the pelvis are unchanged in typical of phleboliths. Normal bowel gas. Small volume of formed stool in the colon. Mild scoliosis and degenerative change in the spine.  IMPRESSION: No visualized urolithiasis.   Electronically Signed By: Andrea Gasman M.D. On: 07/22/2023 20:15  No results found for this or any previous visit.  No results found for this or any previous visit.  No results found for this or any previous visit.  No results found for this or any previous visit.  No results found for this or any previous visit.  No results found for this or any previous visit.  No results found for this or any previous visit.   Assessment & Plan:    1. 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 Urgency of urination -continue mirabegron  50mg  daily  3. Nocturia Continue flomax  0.4mg  daily and finasteride    No  follow-ups on file.  Belvie Clara, MD  [facility] [place]

## 2023-10-23 NOTE — Patient Instructions (Signed)

## 2023-10-25 ENCOUNTER — Encounter: Attending: Gastroenterology | Admitting: *Deleted

## 2023-10-25 VITALS — BP 156/84 | HR 88 | Temp 97.7°F | Resp 18

## 2023-10-25 DIAGNOSIS — K50818 Crohn's disease of both small and large intestine with other complication: Secondary | ICD-10-CM | POA: Diagnosis not present

## 2023-10-25 MED ORDER — SODIUM CHLORIDE 0.9 % IV SOLN
200.0000 mg | Freq: Once | INTRAVENOUS | Status: AC
  Administered 2023-10-25: 200 mg via INTRAVENOUS
  Filled 2023-10-25: qty 20

## 2023-10-25 NOTE — Progress Notes (Signed)
 Diagnosis: Crohn's  Provider:  Eartha Sieving MD  Procedure: IV Infusion  IV Type: Peripheral, IV Location: R Antecubital  Tremfya  , Dose: 200 mg  Infusion Start Time: 1059  Infusion Stop Time: 1206  Post Infusion IV Care: Observation period completed  Discharge: Condition: Good, Destination: Home . AVS Provided  Performed by:  Baldwin Darice Helling, RN

## 2023-10-30 ENCOUNTER — Encounter (INDEPENDENT_AMBULATORY_CARE_PROVIDER_SITE_OTHER): Payer: Self-pay | Admitting: Gastroenterology

## 2023-11-13 DIAGNOSIS — N183 Chronic kidney disease, stage 3 unspecified: Secondary | ICD-10-CM | POA: Diagnosis not present

## 2023-11-13 DIAGNOSIS — D649 Anemia, unspecified: Secondary | ICD-10-CM | POA: Diagnosis not present

## 2023-11-13 DIAGNOSIS — Z0001 Encounter for general adult medical examination with abnormal findings: Secondary | ICD-10-CM | POA: Diagnosis not present

## 2023-11-13 DIAGNOSIS — Z1329 Encounter for screening for other suspected endocrine disorder: Secondary | ICD-10-CM | POA: Diagnosis not present

## 2023-11-13 DIAGNOSIS — R7301 Impaired fasting glucose: Secondary | ICD-10-CM | POA: Diagnosis not present

## 2023-11-13 DIAGNOSIS — R42 Dizziness and giddiness: Secondary | ICD-10-CM | POA: Diagnosis not present

## 2023-11-15 DIAGNOSIS — R220 Localized swelling, mass and lump, head: Secondary | ICD-10-CM | POA: Diagnosis not present

## 2023-11-15 DIAGNOSIS — L03211 Cellulitis of face: Secondary | ICD-10-CM | POA: Diagnosis not present

## 2023-11-17 ENCOUNTER — Other Ambulatory Visit (INDEPENDENT_AMBULATORY_CARE_PROVIDER_SITE_OTHER): Payer: Self-pay | Admitting: Gastroenterology

## 2023-11-17 DIAGNOSIS — K269 Duodenal ulcer, unspecified as acute or chronic, without hemorrhage or perforation: Secondary | ICD-10-CM

## 2023-11-18 ENCOUNTER — Telehealth (INDEPENDENT_AMBULATORY_CARE_PROVIDER_SITE_OTHER): Payer: Self-pay

## 2023-11-18 DIAGNOSIS — K269 Duodenal ulcer, unspecified as acute or chronic, without hemorrhage or perforation: Secondary | ICD-10-CM

## 2023-11-18 MED ORDER — SUCRALFATE 1 GM/10ML PO SUSP: 1.0000 g | Freq: Three times a day (TID) | ORAL | 3 refills | Status: DC

## 2023-11-18 NOTE — Telephone Encounter (Signed)
 Sucralfate  1gm/22ml was sent in to [facility] today. They are calling back stating only a 10 day supply was sent into them today. They need the Quantity increased to for a 30 day supply, as the patient takes this 10 ml QID on a regular bases. Please advise.

## 2023-11-18 NOTE — Telephone Encounter (Signed)
 Sent in 30 day supply.

## 2023-11-18 NOTE — Telephone Encounter (Signed)
 Noted. Thanks.

## 2023-11-20 DIAGNOSIS — D126 Benign neoplasm of colon, unspecified: Secondary | ICD-10-CM | POA: Diagnosis not present

## 2023-11-20 DIAGNOSIS — Z6828 Body mass index (BMI) 28.0-28.9, adult: Secondary | ICD-10-CM | POA: Diagnosis not present

## 2023-11-20 DIAGNOSIS — Z1331 Encounter for screening for depression: Secondary | ICD-10-CM | POA: Diagnosis not present

## 2023-11-20 DIAGNOSIS — G4733 Obstructive sleep apnea (adult) (pediatric): Secondary | ICD-10-CM | POA: Diagnosis not present

## 2023-11-20 DIAGNOSIS — K5190 Ulcerative colitis, unspecified, without complications: Secondary | ICD-10-CM | POA: Diagnosis not present

## 2023-11-20 DIAGNOSIS — Z23 Encounter for immunization: Secondary | ICD-10-CM | POA: Diagnosis not present

## 2023-11-20 DIAGNOSIS — Z0000 Encounter for general adult medical examination without abnormal findings: Secondary | ICD-10-CM | POA: Diagnosis not present

## 2023-11-20 DIAGNOSIS — N401 Enlarged prostate with lower urinary tract symptoms: Secondary | ICD-10-CM | POA: Diagnosis not present

## 2023-11-20 DIAGNOSIS — Z0001 Encounter for general adult medical examination with abnormal findings: Secondary | ICD-10-CM | POA: Diagnosis not present

## 2023-11-20 DIAGNOSIS — Z1389 Encounter for screening for other disorder: Secondary | ICD-10-CM | POA: Diagnosis not present

## 2023-11-22 ENCOUNTER — Encounter: Attending: Gastroenterology | Admitting: *Deleted

## 2023-11-22 ENCOUNTER — Other Ambulatory Visit (HOSPITAL_COMMUNITY): Payer: Self-pay

## 2023-11-22 VITALS — BP 116/62 | HR 73 | Temp 97.5°F | Resp 18

## 2023-11-22 DIAGNOSIS — K50818 Crohn's disease of both small and large intestine with other complication: Secondary | ICD-10-CM | POA: Insufficient documentation

## 2023-11-22 MED ORDER — SODIUM CHLORIDE 0.9 % IV SOLN
200.0000 mg | Freq: Once | INTRAVENOUS | Status: AC
  Administered 2023-11-22: 200 mg via INTRAVENOUS
  Filled 2023-11-22: qty 20

## 2023-11-22 NOTE — Progress Notes (Signed)
 Diagnosis: Crohn's Disease  Provider:  Eartha Sieving MD  Procedure: IV Infusion  IV Type: Peripheral, IV Location: L Antecubital  Tremfya , Dose: 200 mg  Infusion Start Time: 1041  Infusion Stop Time: 1142  Post Infusion IV Care: Observation period completed  Discharge: Condition: Good, Destination: Home . AVS Provided  Performed by:  Baldwin Darice Helling, RN

## 2023-12-02 ENCOUNTER — Other Ambulatory Visit: Payer: Self-pay

## 2023-12-03 ENCOUNTER — Other Ambulatory Visit (INDEPENDENT_AMBULATORY_CARE_PROVIDER_SITE_OTHER): Payer: Self-pay | Admitting: Gastroenterology

## 2023-12-06 ENCOUNTER — Other Ambulatory Visit: Payer: Self-pay

## 2023-12-06 ENCOUNTER — Other Ambulatory Visit (HOSPITAL_COMMUNITY): Payer: Self-pay

## 2023-12-06 NOTE — Progress Notes (Signed)
 Specialty Pharmacy Initial Fill Coordination Note  David Mooney is a 83 y.o. male contacted today regarding initial fill of specialty medication(s) Guselkumab  (Tremfya  Pen)   Patient requested Delivery   Delivery date: 12/18/23   Verified address: [address]   [place], [place] [address]   Medication will be filled on: 12/17/23   Patient is aware of $0.00 copayment.

## 2023-12-06 NOTE — Progress Notes (Signed)
 Specialty Pharmacy Initiation Note   David Mooney is a 83 y.o. male who will be followed by the specialty pharmacy service for RxSp Crohn's Disease    Review of administration, indication, effectiveness, safety, potential side effects, storage/disposable, and missed dose instructions occurred today for patient's specialty medication(s) Guselkumab  (Tremfya  Pen)     Patient/Caregiver did not have any additional questions or concerns.   Patient's therapy is appropriate to: Continue (Patient initaited therapy with infusions and is transitioning to self injection)    Goals Addressed             This Visit's Progress    Minimize recurrence of flares       Patient is on track. Patient will maintain adherence. Per daughter patient has seen improvement since starting infusions.          David Mooney M Maniya Donovan Specialty Pharmacist

## 2023-12-10 ENCOUNTER — Other Ambulatory Visit (INDEPENDENT_AMBULATORY_CARE_PROVIDER_SITE_OTHER): Payer: Self-pay | Admitting: Gastroenterology

## 2023-12-10 ENCOUNTER — Other Ambulatory Visit: Payer: Self-pay

## 2023-12-10 ENCOUNTER — Other Ambulatory Visit (HOSPITAL_COMMUNITY): Payer: Self-pay

## 2023-12-10 ENCOUNTER — Telehealth: Payer: Self-pay

## 2023-12-10 DIAGNOSIS — K50818 Crohn's disease of both small and large intestine with other complication: Secondary | ICD-10-CM

## 2023-12-10 MED ORDER — TREMFYA PEN 200 MG/2ML ~~LOC~~ SOAJ: 200.0000 mg | SUBCUTANEOUS | 12 refills | Status: DC

## 2023-12-10 MED ORDER — TREMFYA PEN 200 MG/2ML ~~LOC~~ SOAJ
200.0000 mg | SUBCUTANEOUS | 12 refills | Status: AC
  Filled 2023-12-10: qty 2, 28d supply, fill #0
  Filled 2024-01-08: qty 2, 28d supply, fill #1
  Filled 2024-02-04: qty 2, 28d supply, fill #2

## 2023-12-10 NOTE — Telephone Encounter (Signed)
 Hi I resent the medication and deleted the start 12/4 part Thanks

## 2023-12-15 ENCOUNTER — Emergency Department (HOSPITAL_COMMUNITY)

## 2023-12-15 ENCOUNTER — Observation Stay (HOSPITAL_COMMUNITY)
Admission: EM | Admit: 2023-12-15 | Discharge: 2023-12-17 | Disposition: A | Attending: Internal Medicine | Admitting: Internal Medicine

## 2023-12-15 ENCOUNTER — Encounter (HOSPITAL_COMMUNITY): Payer: Self-pay | Admitting: Emergency Medicine

## 2023-12-15 DIAGNOSIS — N400 Enlarged prostate without lower urinary tract symptoms: Secondary | ICD-10-CM | POA: Insufficient documentation

## 2023-12-15 DIAGNOSIS — S0093XA Contusion of unspecified part of head, initial encounter: Secondary | ICD-10-CM | POA: Insufficient documentation

## 2023-12-15 DIAGNOSIS — G8191 Hemiplegia, unspecified affecting right dominant side: Secondary | ICD-10-CM | POA: Diagnosis not present

## 2023-12-15 DIAGNOSIS — E876 Hypokalemia: Secondary | ICD-10-CM | POA: Insufficient documentation

## 2023-12-15 DIAGNOSIS — Z79899 Other long term (current) drug therapy: Secondary | ICD-10-CM | POA: Diagnosis not present

## 2023-12-15 DIAGNOSIS — I639 Cerebral infarction, unspecified: Secondary | ICD-10-CM | POA: Diagnosis present

## 2023-12-15 DIAGNOSIS — Z8711 Personal history of peptic ulcer disease: Secondary | ICD-10-CM | POA: Diagnosis not present

## 2023-12-15 DIAGNOSIS — R29818 Other symptoms and signs involving the nervous system: Secondary | ICD-10-CM | POA: Diagnosis not present

## 2023-12-15 DIAGNOSIS — R9431 Abnormal electrocardiogram [ECG] [EKG]: Secondary | ICD-10-CM | POA: Diagnosis not present

## 2023-12-15 DIAGNOSIS — E782 Mixed hyperlipidemia: Secondary | ICD-10-CM | POA: Insufficient documentation

## 2023-12-15 DIAGNOSIS — W19XXXA Unspecified fall, initial encounter: Secondary | ICD-10-CM | POA: Diagnosis not present

## 2023-12-15 DIAGNOSIS — R4182 Altered mental status, unspecified: Secondary | ICD-10-CM | POA: Diagnosis not present

## 2023-12-15 DIAGNOSIS — Z7902 Long term (current) use of antithrombotics/antiplatelets: Secondary | ICD-10-CM | POA: Insufficient documentation

## 2023-12-15 DIAGNOSIS — G3184 Mild cognitive impairment, so stated: Secondary | ICD-10-CM

## 2023-12-15 DIAGNOSIS — G459 Transient cerebral ischemic attack, unspecified: Principal | ICD-10-CM

## 2023-12-15 DIAGNOSIS — K5190 Ulcerative colitis, unspecified, without complications: Secondary | ICD-10-CM | POA: Insufficient documentation

## 2023-12-15 DIAGNOSIS — K219 Gastro-esophageal reflux disease without esophagitis: Secondary | ICD-10-CM | POA: Diagnosis present

## 2023-12-15 LAB — CBC
HCT: 36.4 % — ABNORMAL LOW (ref 39.0–52.0)
Hemoglobin: 12 g/dL — ABNORMAL LOW (ref 13.0–17.0)
MCH: 30 pg (ref 26.0–34.0)
MCHC: 33 g/dL (ref 30.0–36.0)
MCV: 91 fL (ref 80.0–100.0)
Platelets: 172 K/uL (ref 150–400)
RBC: 4 MIL/uL — ABNORMAL LOW (ref 4.22–5.81)
RDW: 13.6 % (ref 11.5–15.5)
WBC: 10.2 K/uL (ref 4.0–10.5)
nRBC: 0 % (ref 0.0–0.2)

## 2023-12-15 LAB — DIFFERENTIAL
Abs Immature Granulocytes: 0.04 K/uL (ref 0.00–0.07)
Basophils Absolute: 0 K/uL (ref 0.0–0.1)
Basophils Relative: 0 %
Eosinophils Absolute: 0 K/uL (ref 0.0–0.5)
Eosinophils Relative: 0 %
Immature Granulocytes: 0 %
Lymphocytes Relative: 12 %
Lymphs Abs: 1.3 K/uL (ref 0.7–4.0)
Monocytes Absolute: 0.7 K/uL (ref 0.1–1.0)
Monocytes Relative: 7 %
Neutro Abs: 8.1 K/uL — ABNORMAL HIGH (ref 1.7–7.7)
Neutrophils Relative %: 81 %

## 2023-12-15 LAB — COMPREHENSIVE METABOLIC PANEL WITH GFR
ALT: 16 U/L (ref 0–44)
AST: 20 U/L (ref 15–41)
Albumin: 4.3 g/dL (ref 3.5–5.0)
Alkaline Phosphatase: 59 U/L (ref 38–126)
Anion gap: 13 (ref 5–15)
BUN: 14 mg/dL (ref 8–23)
CO2: 20 mmol/L — ABNORMAL LOW (ref 22–32)
Calcium: 9.8 mg/dL (ref 8.9–10.3)
Chloride: 109 mmol/L (ref 98–111)
Creatinine, Ser: 1.01 mg/dL (ref 0.61–1.24)
GFR, Estimated: >60 mL/min (ref >60)
Glucose, Bld: 110 mg/dL — ABNORMAL HIGH (ref 70–99)
Potassium: 4.1 mmol/L (ref 3.5–5.1)
Sodium: 141 mmol/L (ref 135–145)
Total Bilirubin: 0.3 mg/dL (ref 0.0–1.2)
Total Protein: 6.6 g/dL (ref 6.5–8.1)

## 2023-12-15 LAB — CBG MONITORING, ED: Glucose-Capillary: 112 mg/dL — ABNORMAL HIGH (ref 70–99)

## 2023-12-15 LAB — I-STAT CHEM 8, ED
BUN: 14 mg/dL (ref 8–23)
Calcium, Ion: 1.32 mmol/L (ref 1.15–1.40)
Chloride: 108 mmol/L (ref 98–111)
Creatinine, Ser: 1.1 mg/dL (ref 0.61–1.24)
Glucose, Bld: 111 mg/dL — ABNORMAL HIGH (ref 70–99)
HCT: 34 % — ABNORMAL LOW (ref 39.0–52.0)
Hemoglobin: 11.6 g/dL — ABNORMAL LOW (ref 13.0–17.0)
Potassium: 4 mmol/L (ref 3.5–5.1)
Sodium: 143 mmol/L (ref 135–145)
TCO2: 22 mmol/L (ref 22–32)

## 2023-12-15 LAB — PROTIME-INR
INR: 1.1 (ref 0.8–1.2)
Prothrombin Time: 14.5 s (ref 11.4–15.2)

## 2023-12-15 LAB — APTT: aPTT: 30 s (ref 24–36)

## 2023-12-15 LAB — ETHANOL: Alcohol, Ethyl (B): <15 mg/dL (ref <15)

## 2023-12-15 NOTE — ED Notes (Signed)
 Pt has some slurred speech and is slow to find all his words.  No facial droop, no focal weakness.  Pt is unable to say what year or date it is or what time of the year it is.  Pt is alert and oriented to self and knows family member who are at the bedside.  Pt tells me that he had his foot get caught on car door and that he fell forward.  Pt has mark on forehead.

## 2023-12-15 NOTE — ED Notes (Signed)
 Tele neurology evaluating pt and at this time pt is able to say the month and seems improved over evaluation on arrival

## 2023-12-15 NOTE — ED Triage Notes (Signed)
 Pt here from home with c/o confusion , pt was found  at [employer] this evening after leaving his house , pt was found to be confused and had fell also urinated on himself

## 2023-12-15 NOTE — ED Provider Notes (Signed)
 [facility] AT [facility] Provider Note   CSN: [id number] Arrival date & time: 12/15/23  2212     Patient presents with: Altered Mental Status   David Mooney is a 83 y.o. male.  {Add pertinent medical, surgical, social history, OB history to YEP:67052}  Altered Mental Status  This patient is a 83 year old male, he is on Lipitor, Nexium , finasteride , mesalamine , Tremfya , Myrbetriq  and Flomax .  He was in his usual state of health up until about 6:00 this evening when he was last seen normal.  At 730 when his wife had woken up from a short nap she noticed that he was not in the house.  She had gone looking for him and found him at the [employer] in his car slumped over the wheel.  The patient had fallen at some point and struck his head, there was no memory of this and the patient was having some slurred speech and seemed to be a little bit weak on the right side including right leg and the right side of his face.  The patient does not recall any of this, the family members who went to see him did some basic neurologic things in the car and it seemed to be some difficulty with finger-nose-finger on the right side, difficulty with walking because of some right leg weakness however all of these things seem to be getting better except for the slurred speech and being slow to answer questions.  There is no complaints of fevers chills or any infectious symptoms earlier in the day.  The patient does not take any anticoagulants.    Prior to Admission medications   Medication Sig Start Date End Date Taking? Authorizing Provider  acetaminophen  (TYLENOL ) 650 MG CR tablet Take 1,300 mg by mouth every 8 (eight) hours as needed for pain.    [provider]  atorvastatin (LIPITOR) 20 MG tablet Take 20 mg by mouth daily. 01/16/22   [provider]  esomeprazole  (NEXIUM ) 40 MG capsule TAKE 1 CAPSULE(40 MG) BY MOUTH TWICE DAILY BEFORE A MEAL  12/03/23   Eartha Flavors, Toribio, MD  finasteride  (PROSCAR ) 5 MG tablet Take 1 tablet (5 mg total) by mouth daily. TAKE 1 TABLET(5 MG) BY MOUTH DAILY 10/23/23   McKenzie, Belvie CROME, MD  Guaifenesin [facility] MAXIMUM STRENGTH) 1200 MG TB12 Take 1 tablet by mouth 2 (two) times daily as needed (chest congestion).    [provider]  mesalamine  (LIALDA ) 1.2 g EC tablet TAKE 2 TABLETS(2.4 GRAMS) BY MOUTH TWICE DAILY 10/30/22   Castaneda Mayorga, Toribio, MD  mirabegron  ER (MYRBETRIQ ) 50 MG TB24 tablet Take 1 tablet (50 mg total) by mouth daily. 10/23/23   McKenzie, Belvie CROME, MD  Multiple Vitamin (MULTIVITAMIN) tablet Take 1 tablet by mouth daily.    [provider]  sucralfate  (CARAFATE ) 1 GM/10ML suspension Take 10 mLs (1 g total) by mouth 4 (four) times daily -  before meals and at bedtime. 11/18/23   Shirlean Therisa LELON, NP  tamsulosin  (FLOMAX ) 0.4 MG CAPS capsule Take 1 capsule (0.4 mg total) by mouth daily. 10/23/23   McKenzie, Belvie CROME, MD  TREMFYA  PEN 200 MG/2ML SOAJ Inject 200 mg into the skin every 28 (twenty-eight) days. 12/10/23   Eartha Flavors Toribio, MD    Allergies: Patient has no known allergies.    Review of Systems  All other systems reviewed and are negative.   Updated Vital Signs BP (!) 140/88 (BP Location: Right Arm)  Pulse 86   Temp 98.5 F (36.9 C) (Oral)   Resp 18   SpO2 96%   Physical Exam Vitals and nursing note reviewed.  Constitutional:      General: He is not in acute distress.    Appearance: He is well-developed.  HENT:     Head: Normocephalic.     Comments: Hematoma and contusion to the forehead    Mouth/Throat:     Pharynx: No oropharyngeal exudate.  Eyes:     General: No scleral icterus.       Right eye: No discharge.        Left eye: No discharge.     Conjunctiva/sclera: Conjunctivae normal.     Pupils: Pupils are equal, round, and reactive to light.  Neck:     Thyroid : No thyromegaly.     Vascular: No JVD.  Cardiovascular:      Rate and Rhythm: Normal rate and regular rhythm.     Heart sounds: Murmur heard.     No friction rub. No gallop.  Pulmonary:     Effort: Pulmonary effort is normal. No respiratory distress.     Breath sounds: Normal breath sounds. No wheezing or rales.  Abdominal:     General: Bowel sounds are normal. There is no distension.     Palpations: Abdomen is soft. There is no mass.     Tenderness: There is no abdominal tenderness.  Musculoskeletal:        General: No tenderness. Normal range of motion.     Cervical back: Normal range of motion and neck supple.     Right lower leg: No edema.     Left lower leg: No edema.  Lymphadenopathy:     Cervical: No cervical adenopathy.  Skin:    General: Skin is warm and dry.     Findings: No erythema or rash.  Neurological:     Mental Status: He is alert.     Coordination: Coordination normal.     Comments: The patient can perform finger-nose-finger and heel shin without difficulty, he has normal strength in all 4 extremities, there is no facial droop.  The patient's speech is slightly slurred and slowed, he is able to come up with the right answers most of the time, sometimes he has difficulty working his way through the answers, this is very abnormal for him.  Psychiatric:        Behavior: Behavior normal.     (all labs ordered are listed, but only abnormal results are displayed) Labs Reviewed  CBG MONITORING, ED - Abnormal; Notable for the following components:      Result Value   Glucose-Capillary 112 (*)    All other components within normal limits    EKG: None  Radiology: No results found.  {Document cardiac monitor, telemetry assessment procedure when appropriate:32947} Procedures   Medications Ordered in the ED - No data to display    {Click here for ABCD2, HEART and other calculators REFRESH Note before signing:1}                              Medical Decision Making Amount and/or Complexity of Data Reviewed Labs:  ordered. Radiology: ordered.    This patient presents to the ED for concern of head injury and possible stroke or TIA, this involves an extensive number of treatment options, and is a complaint that carries with it a high risk of complications and morbidity.  The differential  diagnosis includes stroke, TIA, contusion, subdural hematoma   Co morbidities / Chronic conditions that complicate the patient evaluation  High cholesterol, acid reflux   Additional history obtained:  Additional history obtained from EMR External records from outside source obtained and reviewed including family members at the bedside Followed by urology for prostatic hypertrophy, colonoscopies by Dr. Eartha, Crohn's disease   Lab Tests:  I Ordered, and personally interpreted labs.  The pertinent results include:  ***   Imaging Studies ordered:  I ordered imaging studies including ***  I independently visualized and interpreted imaging which showed *** I agree with the radiologist interpretation   Cardiac Monitoring: / EKG:  The patient was maintained on a cardiac monitor.  I personally viewed and interpreted the cardiac monitored which showed an underlying rhythm of: ***   Problem List / ED Course / Critical interventions / Medication management  *** I ordered medication including ***   Reevaluation of the patient after these medicines showed that the patient *** I have reviewed the patients home medicines and have made adjustments as needed   Consultations Obtained:  I requested consultation with the ***,  and discussed lab and imaging findings as well as pertinent plan - they recommend: ***   Social Determinants of Health:  ***   Test / Admission - Considered:  ***   {Document critical care time when appropriate  Document review of labs and clinical decision tools ie CHADS2VASC2, etc  Document your independent review of radiology images and any outside records  Document your  discussion with family members, caretakers and with consultants  Document social determinants of health affecting pt's care  Document your decision making why or why not admission, treatments were needed:32947:::1}   Final diagnoses:  None    ED Discharge Orders     None

## 2023-12-15 NOTE — ED Notes (Signed)
 Stat consult to teleneurology called at this time. David Mooney

## 2023-12-15 NOTE — ED Notes (Signed)
 Pt was given urinal and advised that urine sample was needed after triage.  Pt has not yet been able to void (he was incontinent of urine PTA).  Held urinal for pt again and advised him that urine sample is needed.  Pt was not able to void yet.

## 2023-12-15 NOTE — ED Provider Notes (Signed)
 10:57 PM Assumed care from Dr. Cleotilde, please see their note for full history, physical and decision making until this point. In brief this is a 83 y.o. year old male who presented to the ED tonight with Altered Mental Status     LKN at 1800. At 1930 he wasn't in the house. Was in the [employer] parking lot weak, head injury. Possibly stroke symptoms at that time. Still with dysarthria. No LVO, outside window for TNK. Pending neuro consult and likely admit for TIA/CVA.   Neuro recommends inpatient cva/tia workup, no TNK. ASA 325, no DAPT 2/2 e/o trauma to head.  D/w Dr. Adeeso for admit.   Labs, studies and imaging reviewed by myself and considered in medical decision making if ordered. Imaging interpreted by radiology.  Labs Reviewed  CBC - Abnormal; Notable for the following components:      Result Value   RBC 4.00 (*)    Hemoglobin 12.0 (*)    HCT 36.4 (*)    All other components within normal limits  DIFFERENTIAL - Abnormal; Notable for the following components:   Neutro Abs 8.1 (*)    All other components within normal limits  CBG MONITORING, ED - Abnormal; Notable for the following components:   Glucose-Capillary 112 (*)    All other components within normal limits  I-STAT CHEM 8, ED - Abnormal; Notable for the following components:   Glucose, Bld 111 (*)    Hemoglobin 11.6 (*)    HCT 34.0 (*)    All other components within normal limits  PROTIME-INR  APTT  COMPREHENSIVE METABOLIC PANEL WITH GFR  ETHANOL  URINE DRUG SCREEN  CBG MONITORING, ED    CT HEAD WO CONTRAST    (Results Pending)    No follow-ups on file.    Calub Tarnow, Selinda, MD 12/16/23 [id number]

## 2023-12-16 ENCOUNTER — Other Ambulatory Visit: Payer: Self-pay

## 2023-12-16 ENCOUNTER — Observation Stay (HOSPITAL_COMMUNITY)

## 2023-12-16 ENCOUNTER — Other Ambulatory Visit (HOSPITAL_COMMUNITY): Payer: Self-pay | Admitting: *Deleted

## 2023-12-16 ENCOUNTER — Observation Stay (HOSPITAL_COMMUNITY): Admit: 2023-12-16 | Discharge: 2023-12-16 | Disposition: A | Attending: Internal Medicine

## 2023-12-16 DIAGNOSIS — S0083XA Contusion of other part of head, initial encounter: Secondary | ICD-10-CM | POA: Diagnosis not present

## 2023-12-16 DIAGNOSIS — G8191 Hemiplegia, unspecified affecting right dominant side: Secondary | ICD-10-CM | POA: Diagnosis not present

## 2023-12-16 DIAGNOSIS — I6782 Cerebral ischemia: Secondary | ICD-10-CM | POA: Diagnosis not present

## 2023-12-16 DIAGNOSIS — G459 Transient cerebral ischemic attack, unspecified: Secondary | ICD-10-CM

## 2023-12-16 DIAGNOSIS — R4781 Slurred speech: Secondary | ICD-10-CM | POA: Diagnosis not present

## 2023-12-16 DIAGNOSIS — I700 Atherosclerosis of aorta: Secondary | ICD-10-CM | POA: Diagnosis not present

## 2023-12-16 DIAGNOSIS — I771 Stricture of artery: Secondary | ICD-10-CM | POA: Diagnosis not present

## 2023-12-16 DIAGNOSIS — I639 Cerebral infarction, unspecified: Secondary | ICD-10-CM | POA: Diagnosis present

## 2023-12-16 DIAGNOSIS — N400 Enlarged prostate without lower urinary tract symptoms: Secondary | ICD-10-CM

## 2023-12-16 DIAGNOSIS — I6523 Occlusion and stenosis of bilateral carotid arteries: Secondary | ICD-10-CM | POA: Diagnosis not present

## 2023-12-16 DIAGNOSIS — S0990XA Unspecified injury of head, initial encounter: Secondary | ICD-10-CM

## 2023-12-16 DIAGNOSIS — Z8719 Personal history of other diseases of the digestive system: Secondary | ICD-10-CM

## 2023-12-16 DIAGNOSIS — K5190 Ulcerative colitis, unspecified, without complications: Secondary | ICD-10-CM | POA: Diagnosis not present

## 2023-12-16 DIAGNOSIS — R569 Unspecified convulsions: Secondary | ICD-10-CM | POA: Diagnosis not present

## 2023-12-16 DIAGNOSIS — G3184 Mild cognitive impairment, so stated: Secondary | ICD-10-CM | POA: Diagnosis not present

## 2023-12-16 DIAGNOSIS — R29818 Other symptoms and signs involving the nervous system: Secondary | ICD-10-CM | POA: Diagnosis not present

## 2023-12-16 DIAGNOSIS — R410 Disorientation, unspecified: Secondary | ICD-10-CM

## 2023-12-16 DIAGNOSIS — R531 Weakness: Secondary | ICD-10-CM | POA: Diagnosis not present

## 2023-12-16 LAB — COMPREHENSIVE METABOLIC PANEL WITH GFR
ALT: 15 U/L (ref 0–44)
AST: 18 U/L (ref 15–41)
Albumin: 3.6 g/dL (ref 3.5–5.0)
Alkaline Phosphatase: 50 U/L (ref 38–126)
Anion gap: 9 (ref 5–15)
BUN: 13 mg/dL (ref 8–23)
CO2: 24 mmol/L (ref 22–32)
Calcium: 9.2 mg/dL (ref 8.9–10.3)
Chloride: 108 mmol/L (ref 98–111)
Creatinine, Ser: 1.01 mg/dL (ref 0.61–1.24)
GFR, Estimated: >60 mL/min (ref >60)
Glucose, Bld: 149 mg/dL — ABNORMAL HIGH (ref 70–99)
Potassium: 3.4 mmol/L — ABNORMAL LOW (ref 3.5–5.1)
Sodium: 140 mmol/L (ref 135–145)
Total Bilirubin: 0.3 mg/dL (ref 0.0–1.2)
Total Protein: 5.5 g/dL — ABNORMAL LOW (ref 6.5–8.1)

## 2023-12-16 LAB — TSH: TSH: 3.62 u[IU]/mL (ref 0.350–4.500)

## 2023-12-16 LAB — CBC
HCT: 31.2 % — ABNORMAL LOW (ref 39.0–52.0)
Hemoglobin: 10.2 g/dL — ABNORMAL LOW (ref 13.0–17.0)
MCH: 29.8 pg (ref 26.0–34.0)
MCHC: 32.7 g/dL (ref 30.0–36.0)
MCV: 91.2 fL (ref 80.0–100.0)
Platelets: 145 K/uL — ABNORMAL LOW (ref 150–400)
RBC: 3.42 MIL/uL — ABNORMAL LOW (ref 4.22–5.81)
RDW: 13.7 % (ref 11.5–15.5)
WBC: 7.3 K/uL (ref 4.0–10.5)
nRBC: 0 % (ref 0.0–0.2)

## 2023-12-16 LAB — PHOSPHORUS: Phosphorus: 3 mg/dL (ref 2.5–4.6)

## 2023-12-16 LAB — ECHOCARDIOGRAM COMPLETE
AR max vel: 0.87 cm2
AV Area VTI: 1.17 cm2
AV Area mean vel: 0.9 cm2
AV Mean grad: 21 mmHg
AV Peak grad: 39 mmHg
Ao pk vel: 3.12 m/s
Area-P 1/2: 2.8 cm2
Height: 66 in
P 1/2 time: 562 ms
S' Lateral: 3 cm
Weight: 2720 [oz_av]

## 2023-12-16 LAB — URINALYSIS, COMPLETE (UACMP) WITH MICROSCOPIC
Bacteria, UA: NONE SEEN
Bilirubin Urine: NEGATIVE
Glucose, UA: NEGATIVE mg/dL
Ketones, ur: NEGATIVE mg/dL
Nitrite: NEGATIVE
Protein, ur: NEGATIVE mg/dL
Specific Gravity, Urine: 1.03 (ref 1.005–1.030)
pH: 6 (ref 5.0–8.0)

## 2023-12-16 LAB — LIPID PANEL
Cholesterol: 111 mg/dL (ref 0–200)
HDL: 39 mg/dL — ABNORMAL LOW (ref >40)
LDL Cholesterol: 52 mg/dL (ref 0–99)
Total CHOL/HDL Ratio: 2.9 ratio
Triglycerides: 103 mg/dL (ref <150)
VLDL: 21 mg/dL (ref 0–40)

## 2023-12-16 LAB — MAGNESIUM: Magnesium: 2.3 mg/dL (ref 1.7–2.4)

## 2023-12-16 LAB — FOLATE: Folate: >20 ng/mL (ref >5.9)

## 2023-12-16 LAB — VITAMIN B12: Vitamin B-12: 601 pg/mL (ref 180–914)

## 2023-12-16 LAB — T4, FREE: Free T4: 0.87 ng/dL (ref 0.61–1.12)

## 2023-12-16 MED ORDER — ACETAMINOPHEN 160 MG/5ML PO SOLN: 650.0000 mg | ORAL | Status: DC | PRN

## 2023-12-16 MED ORDER — ASPIRIN 81 MG PO CHEW
81.0000 mg | CHEWABLE_TABLET | Freq: Every day | ORAL | Status: DC
  Administered 2023-12-16 – 2023-12-17 (×2): 81 mg via ORAL
  Filled 2023-12-16 (×2): qty 1

## 2023-12-16 MED ORDER — SODIUM CHLORIDE 0.9 % IV SOLN: INTRAVENOUS | Status: AC

## 2023-12-16 MED ORDER — MESALAMINE 1.2 G PO TBEC
1.2000 g | DELAYED_RELEASE_TABLET | Freq: Every day | ORAL | Status: DC
  Filled 2023-12-16: qty 1

## 2023-12-16 MED ORDER — MIRABEGRON ER 25 MG PO TB24
50.0000 mg | ORAL_TABLET | Freq: Every day | ORAL | Status: DC
  Administered 2023-12-16 – 2023-12-17 (×2): 50 mg via ORAL
  Filled 2023-12-16 (×2): qty 2

## 2023-12-16 MED ORDER — SUCRALFATE 1 GM/10ML PO SUSP
1.0000 g | Freq: Three times a day (TID) | ORAL | Status: DC
  Administered 2023-12-16 – 2023-12-17 (×3): 1 g via ORAL
  Filled 2023-12-16 (×3): qty 10

## 2023-12-16 MED ORDER — ASPIRIN 325 MG PO TABS
325.0000 mg | ORAL_TABLET | Freq: Once | ORAL | Status: AC
  Administered 2023-12-16: 325 mg via ORAL
  Filled 2023-12-16: qty 1

## 2023-12-16 MED ORDER — ACETAMINOPHEN 650 MG RE SUPP: 650.0000 mg | RECTAL | Status: DC | PRN

## 2023-12-16 MED ORDER — PANTOPRAZOLE SODIUM 40 MG PO TBEC
80.0000 mg | DELAYED_RELEASE_TABLET | Freq: Every day | ORAL | Status: DC
  Administered 2023-12-16 – 2023-12-17 (×2): 80 mg via ORAL
  Filled 2023-12-16 (×2): qty 2

## 2023-12-16 MED ORDER — SENNOSIDES-DOCUSATE SODIUM 8.6-50 MG PO TABS: 1.0000 | ORAL_TABLET | Freq: Every evening | ORAL | Status: DC | PRN

## 2023-12-16 MED ORDER — ATORVASTATIN CALCIUM 40 MG PO TABS
20.0000 mg | ORAL_TABLET | Freq: Every day | ORAL | Status: DC
  Administered 2023-12-16 – 2023-12-17 (×2): 20 mg via ORAL
  Filled 2023-12-16 (×2): qty 1

## 2023-12-16 MED ORDER — FINASTERIDE 5 MG PO TABS
5.0000 mg | ORAL_TABLET | Freq: Every day | ORAL | Status: DC
  Administered 2023-12-16 – 2023-12-17 (×2): 5 mg via ORAL
  Filled 2023-12-16 (×2): qty 1

## 2023-12-16 MED ORDER — ACETAMINOPHEN 325 MG PO TABS
650.0000 mg | ORAL_TABLET | ORAL | Status: DC | PRN
  Administered 2023-12-16: 650 mg via ORAL
  Filled 2023-12-16: qty 2

## 2023-12-16 MED ORDER — STROKE: EARLY STAGES OF RECOVERY BOOK
Freq: Once | Status: DC
  Filled 2023-12-16: qty 1

## 2023-12-16 MED ORDER — CLOPIDOGREL BISULFATE 75 MG PO TABS
75.0000 mg | ORAL_TABLET | Freq: Every day | ORAL | Status: DC
  Administered 2023-12-16 – 2023-12-17 (×2): 75 mg via ORAL
  Filled 2023-12-16 (×2): qty 1

## 2023-12-16 MED ORDER — IOHEXOL 350 MG/ML SOLN
75.0000 mL | Freq: Once | INTRAVENOUS | Status: AC | PRN
  Administered 2023-12-16: 75 mL via INTRAVENOUS

## 2023-12-16 NOTE — H&P (Signed)
 History and Physical    Patient: David Mooney FMW:[id number] DOB: 1940/06/22 DOA: 12/15/2023 DOS: the patient was seen and examined on 12/16/2023 PCP: Lari Elspeth BRAVO, MD  Patient coming from: Home  Chief Complaint:  Chief Complaint  Patient presents with   Altered Mental Status   HPI: David Mooney is a 83 y.o. male with medical history significant of BPH, ulcerative colitis, DVT, duodenal ulcer who presents to the emergency department due to acute onset of right-sided weakness and slurred speech.  Last known well was 6 PM yesterday, wife noted that it was not at home when she woke up from a short nap at 7:30 PM, she went looking for him and was seen at [employer] store's parking lot slumped over there wheeled.  He presented with a bruise on his head with suspicion that he has  probably fallen earlier.  He demonstrated right-sided weakness and some slurred speech.  EMS was activated and patient was taken to the ED for further evaluation.  ED course In the emergency department, respiratory rate 24/min, BP 140/68, other vital signs were within normal range.  Workup in the ED showed normocytic anemia, BMP was normal except for bicarb of 20 and blood glucose of 110.  Alcohol  level was less than 15. CT head without contrast showed no acute intracranial abnormality EKG personally reviewed reviewed showed normal sinus rhythm at a rate of 93 bpm Aspirin 325 mg x 1 was given Teleneurology was consulted and recommended further stroke workup  Review of Systems: As mentioned in the history of present illness. All other systems reviewed and are negative. Past Medical History:  Diagnosis Date   Arthritis    Cataract    Complication of anesthesia    Embolus (HCC) 12/2022   left Hip   GERD (gastroesophageal reflux disease)    Glaucoma    Hernia of unspecified site of abdominal cavity without mention of obstruction or gangrene    History of gastric ulcer    History of kidney stones     PONV (postoperative nausea and vomiting)    Ulcerative colitis    Past Surgical History:  Procedure Laterality Date   BIOPSY  12/04/2018   Procedure: BIOPSY;  Surgeon: Golda Claudis PENNER, MD;  [facility]: [facility];  Service: Endoscopy;;  cecum   COLONOSCOPY     COLONOSCOPY N/A 08/27/2012   Procedure: COLONOSCOPY;  Surgeon: Claudis PENNER Golda, MD;  Location: [facility];  Service: Endoscopy;  Laterality: N/A;  1200-moved to 13:15 Ann to notify pt   COLONOSCOPY N/A 12/04/2018   Procedure: COLONOSCOPY;  Surgeon: Golda Claudis PENNER, MD;  Location: [facility];  Service: Endoscopy;  Laterality: N/A;  155pm   COLONOSCOPY N/A 10/01/2023   Procedure: COLONOSCOPY;  Surgeon: Eartha Angelia Sieving, MD;  Location: [facility];  Service: Gastroenterology;  Laterality: N/A;  8:15 am, asa 3   CYSTOSCOPY WITH INSERTION OF UROLIFT N/A 04/30/2022   Procedure: CYSTOSCOPY WITH INSERTION OF UROLIFT;  Surgeon: Sherrilee Belvie CROME, MD;  Location: [facility];  Service: Urology;  Laterality: N/A;   ESOPHAGOGASTRODUODENOSCOPY N/A 07/09/2023   Procedure: EGD (ESOPHAGOGASTRODUODENOSCOPY);  Surgeon: Eartha Angelia, Sieving, MD;  [facility]: [facility];  Service: Gastroenterology;  Laterality: N/A;  7:30AM;ASA 3   ESOPHAGOGASTRODUODENOSCOPY (EGD) WITH PROPOFOL   01/04/2023   Procedure: ESOPHAGOGASTRODUODENOSCOPY (EGD) WITH PROPOFOL ;  Surgeon: Cindie Carlin POUR, DO;  [facility]: [facility];  Service: Endoscopy;;   ESOPHAGOGASTRODUODENOSCOPY (EGD) WITH PROPOFOL  N/A 04/09/2023   Procedure: ESOPHAGOGASTRODUODENOSCOPY (EGD) WITH  PROPOFOL ;  Surgeon: Eartha Flavors, Toribio, MD;  Location: [facility];  Service: Gastroenterology;  Laterality: N/A;  7:30am;asa 3   HERNIA REPAIR Left    MOLE REMOVAL     From head , chest   UPPER GASTROINTESTINAL ENDOSCOPY     Social History:  reports that he has never smoked. He has never used smokeless tobacco. He reports that he does not drink alcohol  and does not use drugs.  No  Known Allergies  Family History  Problem Relation Age of Onset   Arrhythmia Mother    Anuerysm Father    Hypertension Father    Diabetes Father    Healthy Sister    Healthy Sister    Irritable bowel syndrome Daughter    Thyroid  disease Daughter     Prior to Admission medications   Medication Sig Start Date End Date Taking? Authorizing Provider  acetaminophen  (TYLENOL ) 650 MG CR tablet Take 1,300 mg by mouth every 8 (eight) hours as needed for pain.    [provider]  atorvastatin (LIPITOR) 20 MG tablet Take 20 mg by mouth daily. 01/16/22   [provider]  esomeprazole  (NEXIUM ) 40 MG capsule TAKE 1 CAPSULE(40 MG) BY MOUTH TWICE DAILY BEFORE A MEAL 12/03/23   Eartha Flavors, Toribio, MD  finasteride  (PROSCAR ) 5 MG tablet Take 1 tablet (5 mg total) by mouth daily. TAKE 1 TABLET(5 MG) BY MOUTH DAILY 10/23/23   McKenzie, Belvie CROME, MD  Guaifenesin [facility] MAXIMUM STRENGTH) 1200 MG TB12 Take 1 tablet by mouth 2 (two) times daily as needed (chest congestion).    [provider]  mesalamine  (LIALDA ) 1.2 g EC tablet TAKE 2 TABLETS(2.4 GRAMS) BY MOUTH TWICE DAILY 10/30/22   Castaneda Mayorga, Toribio, MD  mirabegron  ER (MYRBETRIQ ) 50 MG TB24 tablet Take 1 tablet (50 mg total) by mouth daily. 10/23/23   McKenzie, Belvie CROME, MD  Multiple Vitamin (MULTIVITAMIN) tablet Take 1 tablet by mouth daily.    [provider]  sucralfate  (CARAFATE ) 1 GM/10ML suspension Take 10 mLs (1 g total) by mouth 4 (four) times daily -  before meals and at bedtime. 11/18/23   Shirlean Therisa ORN, NP  tamsulosin  (FLOMAX ) 0.4 MG CAPS capsule Take 1 capsule (0.4 mg total) by mouth daily. 10/23/23   McKenzie, Belvie CROME, MD  TREMFYA  PEN 200 MG/2ML SOAJ Inject 200 mg into the skin every 28 (twenty-eight) days. 12/10/23   Eartha Flavors Toribio, MD    Physical Exam: Vitals:   12/15/23 2228 12/15/23 2230 12/15/23 2340  BP: (!) 140/88 (!) 140/88 (!) 148/68  Pulse: 86 94 81  Resp: 18 (!) 24 13   Temp: 98.5 F (36.9 C)    TempSrc: Oral    SpO2: 96% 95% 96%   General: Elderly male. Awake and alert and oriented x3. Not in any acute distress.  HEENT: Head contusion.  PERRLA. EOMI. Sclerae anicteric.  Moist mucosal membranes. Neck: Neck supple without lymphadenopathy. No carotid bruits. No masses palpated.  Cardiovascular: Regular rate with normal S1-S2 sounds. No murmurs, rubs or gallops auscultated. No JVD.  Respiratory: Clear breath sounds.  No accessory muscle use. Abdomen: Soft, nontender, nondistended. Active bowel sounds. No masses or hepatosplenomegaly  Skin: No rashes, lesions, or ulcerations.  Dry, warm to touch. Musculoskeletal:  2+ dorsalis pedis and radial pulses. Good ROM.  No contractures  Psychiatric: Intact judgment and insight.  Mood appropriate to current condition. Neurologic: No focal neurological deficits. Strength is 5/5 x 4.  CN II - XII grossly intact, NIHSS  0.  Assessment and Plan: Right-sided weakness, r/o acute ischemic stroke vs TIA Patient will be admitted to telemetry unit CT of head without contrast showed no acute intracranial abnormality CT angiography of head and neck in the morning Echocardiogram in the morning MRI of brain without contrast in the morning Aspirin 325 mg p.o. x 1 was given.  Continue aspirin and statin Continue fall precautions and neuro checks Lipid panel and hemoglobin A1c will be checked Continue PT/SLP/OT eval and treat Bedside swallow eval by nursing prior to diet Teleneurology recommendations appreciated Neurology will be consulted to see patient in the morning  Head contusion Stable, continue wound care  Benign prostatic hyperplasia Continue tamsulosin , mirabegron  and finasteride  when patient resumes oral intake  Ulcerative colitis  Continue mesalamine  when patient resumes oral intake  History of duodenal ulcer/esophagitis Continue Protonix     Advance Care Planning: Full code  Consults: Neurology  Family  Communication: Wife and daughter at bedside (all questions answered to satisfaction)  Severity of Illness: The appropriate patient status for this patient is OBSERVATION. Observation status is judged to be reasonable and necessary in order to provide the required intensity of service to ensure the patient's safety. The patient's presenting symptoms, physical exam findings, and initial radiographic and laboratory data in the context of their medical condition is felt to place them at decreased risk for further clinical deterioration. Furthermore, it is anticipated that the patient will be medically stable for discharge from the hospital within 2 midnights of admission.   Author: Burak Zerbe, DO 12/16/2023 12:32 AM  For on call review [email or web address].

## 2023-12-16 NOTE — Progress Notes (Signed)
 SLP Cancellation Note  Patient Details Name: David Mooney MRN: [id number] DOB: 09-13-1940   Cancelled treatment:       Reason Eval/Treat Not Completed: SLP screened, no needs identified, will sign off; SLP screened Pt in room. Pt denies any changes in swallowing, speech, language, or cognition. MRI negative for acute changes. SLE will be deferred at this time. Reconsult if indicated. SLP will sign off.   Thank you,  Lamar Candy, CCC-SLP [phone]  Hector Taft 12/16/2023, 5:06 PM

## 2023-12-16 NOTE — Evaluation (Signed)
 Physical Therapy Evaluation Patient Details Name: David Mooney MRN: [id number] DOB: 05-Sep-1940 Today's Date: 12/16/2023  History of Present Illness  David Mooney is a 83 y.o. male with medical history significant of BPH, ulcerative colitis, DVT, duodenal ulcer who presents to the emergency department due to acute onset of right-sided weakness and slurred speech.  Last known well was 6 PM yesterday, wife noted that it was not at home when she woke up from a short nap at 7:30 PM, she went looking for him and was seen at [employer] store's parking lot slumped over there wheeled.  He presented with a bruise on his head with suspicion that he has  probably fallen earlier.  He demonstrated right-sided weakness and some slurred speech.  EMS was activated and patient was taken to the ED for further evaluation.    Clinical Impression  Pt. Presented w/ general weakness in LE due to dx. Co-treatment w/ OT. Pt. Was able to understand commands, and had mild slurred speech during questioning, however still able to understand pt. answers. Pt. was able to perform bed mobility w/ CGA/ Min A w/ increased labored to EOB. Pt was able to transfer from bed to chair w/o AD and CGA and able to maintain standing balance during transfer. Pt was able to ambulate into the hallway w/o AD w/ CGA, pt was unsteady and had difficulty w/ coordination, and drifted during ambulation. Pt was able to self correct themselves. Pt was left in chair w/ Call bell at bedside. Pt SpO2 remained 100% throughout treatment. Nursing staff was notified on pt. Status, and vitals. Patient will benefit from continued skilled physical therapy in hospital and recommended venue below to increase strength, balance, endurance for safe ADLs and gait.       If plan is discharge home, recommend the following: A little help with walking and/or transfers;A little help with bathing/dressing/bathroom;Assistance with cooking/housework   Can travel by  private vehicle    Yes    Equipment Recommendations None recommended by PT  Recommendations for Other Services       Functional Status Assessment Patient has had a recent decline in their functional status and demonstrates the ability to make significant improvements in function in a reasonable and predictable amount of time.     Precautions / Restrictions Precautions Precautions: Fall Recall of Precautions/Restrictions: Intact Restrictions Weight Bearing Restrictions Per Provider Order: No      Mobility  Bed Mobility Overal bed mobility: Needs Assistance Bed Mobility: Supine to Sit     Supine to sit: Contact guard, Min assist     General bed mobility comments: slow/labored movement    Transfers Overall transfer level: Needs assistance Equipment used: None Transfers: Sit to/from Stand, Bed to chair/wheelchair/BSC Sit to Stand: Contact guard assist, Supervision   Step pivot transfers: Contact guard assist, Supervision       General transfer comment: EOB to chair without AD    Ambulation/Gait Ambulation/Gait assistance: Contact guard assist Gait Distance (Feet): 55 Feet Assistive device: None Gait Pattern/deviations: Step-through pattern, Decreased step length - right, Decreased step length - left, Decreased stance time - right, Decreased stance time - left, Decreased stride length, Drifts right/left, Narrow base of support       General Gait Details: Pt. was able to ambulate w/o AD, pt was unsteady and incooridnated w/ gait during ambulation into hallway, pt was able to self-correct if drifted off  [employer]  Mobility     Tilt Bed    Modified Rankin (Stroke Patients Only)       Balance Overall balance assessment: Mild deficits observed, not formally tested                                           Pertinent Vitals/Pain Pain Assessment Pain Assessment: No/denies pain    Home Living Family/patient  expects to be discharged to:: Private residence Living Arrangements: Spouse/significant other Available Help at Discharge: Family;Available 24 hours/day Type of Home: House Home Access: Stairs to enter Entrance Stairs-Rails: None Entrance Stairs-Number of Steps: 2   Home Layout: One level Home Equipment: Agricultural Consultant (2 wheels);Shower seat Additional Comments: Pt's wife has her own physical issues; daughter lives close by.    Prior Function Prior Level of Function : Independent/Modified Independent;Driving;History of Falls (last six months)             Mobility Comments: Tourist information centre manager without AD ADLs Comments: Independent     Extremity/Trunk Assessment   Upper Extremity Assessment Upper Extremity Assessment: Defer to OT evaluation LUE Deficits / Details: WFL strength but mild difficulty with gross motor coordination for finger to nose test. LUE Sensation: WNL LUE Coordination: decreased gross motor    Lower Extremity Assessment Lower Extremity Assessment: RLE deficits/detail;LLE deficits/detail RLE Deficits / Details: Hip Flexion 4+/5, Knee Flexion and extension 5/5, ABD/ ADD 5/5, DF/ PF 5/5 RLE Sensation: WNL RLE Coordination: WNL LLE Deficits / Details: Hip Flexion 4/5, Knee Flexion 4+/ 5,  Knee extension 5/5, ABD/ ADD 5/5, DF/ PF 5/5 LLE Sensation: WNL LLE Coordination: WNL    Cervical / Trunk Assessment Cervical / Trunk Assessment: Normal  Communication   Communication Communication: No apparent difficulties    Cognition Arousal: Alert Behavior During Therapy: WFL for tasks assessed/performed                             Following commands: Intact       Cueing Cueing Techniques: Verbal cues, Tactile cues     General Comments General comments (skin integrity, edema, etc.): during questioning pt. had mild slurred speech    Exercises     Assessment/Plan    PT Assessment Patient needs continued PT services  PT Problem List  Decreased strength;Decreased range of motion;Decreased activity tolerance;Decreased balance;Decreased mobility;Decreased coordination;Decreased safety awareness       PT Treatment Interventions DME instruction;Gait training;Stair training;Functional mobility training;Therapeutic activities;Therapeutic exercise;Balance training;Patient/family education    PT Goals (Current goals can be found in the Care Plan section)  Acute Rehab PT Goals Patient Stated Goal: wants to return home PT Goal Formulation: With patient Time For Goal Achievement: 12/20/23 Potential to Achieve Goals: Good    Frequency Min 3X/week     Co-evaluation PT/OT/SLP Co-Evaluation/Treatment: Yes Reason for Co-Treatment: To address functional/ADL transfers PT goals addressed during session: Mobility/safety with mobility OT goals addressed during session: ADL's and self-care       AM-PAC PT 6 Clicks Mobility  Outcome Measure Help needed turning from your back to your side while in a flat bed without using bedrails?: A Little Help needed moving from lying on your back to sitting on the side of a flat bed without using bedrails?: A Little Help needed moving to and from a bed to a chair (including a wheelchair)?: A Little Help needed standing up  from a chair using your arms (e.g., wheelchair or bedside chair)?: A Little Help needed to walk in hospital room?: A Little Help needed climbing 3-5 steps with a railing? : A Lot 6 Click Score: 17    End of Session Equipment Utilized During Treatment: Gait belt Activity Tolerance: Patient tolerated treatment well;Patient limited by fatigue Patient left: in chair;with call bell/phone within reach Nurse Communication: Mobility status PT Visit Diagnosis: Unsteadiness on feet (R26.81);Repeated falls (R29.6);Muscle weakness (generalized) (M62.81)    Time: 9143-9071 PT Time Calculation (min) (ACUTE ONLY): 32 min   Charges:   PT Evaluation $PT Eval Moderate Complexity: 1  Mod PT Treatments $Therapeutic Activity: 23-37 mins PT General Charges $$ ACUTE PT VISIT: 1 Visit        Hurshel Bouillon, SPT

## 2023-12-16 NOTE — ED Notes (Signed)
 Changed Pt grown and sheet, placed Pt in a brief.

## 2023-12-16 NOTE — ED Notes (Signed)
 Pt was incontinent of urine.  Pt cleaned up and placed in depend.

## 2023-12-16 NOTE — Progress Notes (Addendum)
 PROGRESS NOTE  JAYMIR STRUBLE FMW:[id number] DOB: 10-31-1940 DOA: 12/15/2023 PCP: Lari Elspeth BRAVO, MD  Brief History76  83 year old male with a history of BPH, ulcerative colitis, DVT (01/07/23--finished Eliquis course 08/03/2023), duodenal ulcer presenting with right-sided weakness and slurred speech. The patient's spouse had woken up from a nap at 7:30 PM and noticed that the patient was not in the house.  She went to look for him and found him at the [employer] in his car slumped over the steering wheel.  In speaking with the patient, he states that he was trying to get out of his car at [employer] when he noticed some right leg weakness, and he had difficulty swinging his leg out of the car.  This resulted in him falling onto the pavement.  He was too weak to get up.  Bystanders helped the patient get back into the car.  According to the patient, he subsequently  fell asleep, and the next thing he remembered was his son-in-law trying to wake him up.  When the patient's family arrived, they found the patient slumped over the steering wheel.  The patient was having some slurred speech and had some right-sided weakness including his right leg and face.  Family came to see him and apparently performed some basic neurologic testing, and it seemed that the patient was having difficulty walking with right leg weakness and difficulty with finger-nose-finger on his right side.  The weakness symptoms improved, but the patient continued to have slurred speech and slurred speech.  He was slight answer questions.  The patient had recent hospital admission from 08/13/2023 to 08/17/2023 for small bowel obstruction.  The patient clinically improved with nonoperative management with steroids.  He was discharged with a steroid taper.     Assessment/Plan: Right hemiparesis/transient loss of consciousness -Appreciate Neurology Consult -PT/OT evaluation -Speech therapy eval -CT  brain--negative -CTA HH&N--negative for LVO, no hemodynamically significant stenosis.  Scattered SVT intracranial circulation -MRI brain-- -Echo-- -LDL--52 -HbA1C-- -Antiplatelet--ASA 81 mg - Obtain EEG  BPH - Continue Myrbetriq  and finasteride   Mixed hyperlipidemia - Continue statin  GERD/duodenal ulcer - Continue Carafate  - Continue PPI  Ulcerative colitis - Patient is on Tremfya  q. 28 days - No longer taking mesalamine             Family Communication:   Family at bedside  Consultants:  neurology  Code Status:  FULL   DVT Prophylaxis:  [place] Heparin   Procedures: As Listed in Progress Note Above  Antibiotics: None      Subjective: Patient denies fevers, chills, headache, chest pain, dyspnea, nausea, vomiting, diarrhea, abdominal pain, dysuria, hematuria, hematochezia, and melena.   Objective: Vitals:   12/16/23 0030 12/16/23 0100 12/16/23 0158 12/16/23 0528  BP: (!) 154/77 135/71 139/75 (!) 92/51  Pulse: 85 83 76   Resp: 12 14    Temp:   98.8 F (37.1 C) 98.1 F (36.7 C)  TempSrc:   Oral Oral  SpO2: 92% 93% 98%   Weight:   77.1 kg   Height:   5' 6 (1.676 m)     Intake/Output Summary (Last 24 hours) at 12/16/2023 0815 Last data filed at 12/16/2023 0300 Gross per 24 hour  Intake 30.31 ml  Output --  Net 30.31 ml   Weight change:  Exam:  General:  Pt is alert, follows commands appropriately, not in acute distress HEENT: No icterus, No thrush, No neck mass, Reynolds/AT Cardiovascular:  RRR, S1/S2, no rubs, no gallops Respiratory: CTA bilaterally, no wheezing, no crackles, no rhonchi Abdomen: Soft/+BS, non tender, non distended, no guarding Extremities: No edema, No lymphangitis, No petechiae, No rashes, no synovitis Neuro:  CN II-XII intact, strength 4/5 in RUE, RLE, strength 4/5 LUE, LLE; sensation intact bilateral; no dysmetria; babinski equivocal    Data Reviewed: I have personally reviewed following labs and imaging studies Basic  Metabolic Panel: Recent Labs  Lab 12/15/23 2240 12/15/23 2242 12/16/23 0430  NA 141 143 140  K 4.1 4.0 3.4*  CL 109 108 108  CO2 20*  --  24  GLUCOSE 110* 111* 149*  BUN 14 14 13   CREATININE 1.01 1.10 1.01  CALCIUM  9.8  --  9.2  MG  --   --  2.3  PHOS  --   --  3.0   Liver Function Tests: Recent Labs  Lab 12/15/23 2240 12/16/23 0430  AST 20 18  ALT 16 15  ALKPHOS 59 50  BILITOT 0.3 0.3  PROT 6.6 5.5*  ALBUMIN 4.3 3.6   No results for input(s): LIPASE, AMYLASE in the last 168 hours. No results for input(s): AMMONIA in the last 168 hours. Coagulation Profile: Recent Labs  Lab 12/15/23 2240  INR 1.1   CBC: Recent Labs  Lab 12/15/23 2240 12/15/23 2242 12/16/23 0430  WBC 10.2  --  7.3  NEUTROABS 8.1*  --   --   HGB 12.0* 11.6* 10.2*  HCT 36.4* 34.0* 31.2*  MCV 91.0  --  91.2  PLT 172  --  145*   Cardiac Enzymes: No results for input(s): CKTOTAL, CKMB, CKMBINDEX, TROPONINI in the last 168 hours. BNP: Invalid input(s): POCBNP CBG: Recent Labs  Lab 12/15/23 2229  GLUCAP 112*   HbA1C: No results for input(s): HGBA1C in the last 72 hours. Urine analysis:    Component Value Date/Time   COLORURINE STRAW (A) 08/13/2023 1805   APPEARANCEUR Clear 10/23/2023 1024   LABSPEC 1.035 (H) 08/13/2023 1805   PHURINE 6.0 08/13/2023 1805   GLUCOSEU Negative 10/23/2023 1024   HGBUR NEGATIVE 08/13/2023 1805   BILIRUBINUR Negative 10/23/2023 1024   KETONESUR NEGATIVE 08/13/2023 1805   PROTEINUR Negative 10/23/2023 1024   PROTEINUR NEGATIVE 08/13/2023 1805   NITRITE Negative 10/23/2023 1024   NITRITE NEGATIVE 08/13/2023 1805   LEUKOCYTESUR 1+ (A) 10/23/2023 1024   LEUKOCYTESUR NEGATIVE 08/13/2023 1805   Sepsis Labs: @LABRCNTIP (procalcitonin:4,lacticidven:4) )No results found for this or any previous visit (from the past 240 hours).   Scheduled Meds:  [START ON 12/17/2023]  stroke: early stages of recovery book   Does not apply Once    atorvastatin   20 mg Oral Daily   finasteride   5 mg Oral Daily   mesalamine   1.2 g Oral Q breakfast   mirabegron  ER  50 mg Oral Daily   pantoprazole   80 mg Oral Q1200   Continuous Infusions:  sodium chloride  40 mL/hr at 12/16/23 0214    Procedures/Studies: CT ANGIO HEAD NECK W WO CM Result Date: 12/16/2023 CLINICAL DATA:  Initial evaluation for acute neuro deficit, stroke. EXAM: CT ANGIOGRAPHY HEAD AND NECK WITH AND WITHOUT CONTRAST TECHNIQUE: Multidetector CT imaging of the head and neck was performed using the standard protocol during bolus administration of intravenous contrast. Multiplanar CT image reconstructions and MIPs were obtained to evaluate the vascular anatomy. Carotid stenosis measurements (when applicable) are obtained utilizing NASCET criteria, using the distal internal carotid diameter as the denominator. RADIATION DOSE REDUCTION: This exam was performed according to the  departmental dose-optimization program which includes automated exposure control, adjustment of the mA and/or kV according to patient size and/or use of iterative reconstruction technique. CONTRAST:  75mL OMNIPAQUE  IOHEXOL  350 MG/ML SOLN COMPARISON:  CT from 12/15/2023. FINDINGS: CTA NECK FINDINGS Aortic arch: Visualized aortic arch within normal limits for caliber with standard 3 vessel morphology. Aortic atherosclerosis. No significant stenosis about the origin the great vessels. Right carotid system: Right common and internal carotid arteries are patent without dissection. Mild for age atheromatous change about the right carotid bulb without stenosis. Left carotid system: Left common and internal carotid arteries are patent without dissection. Mild for age atheromatous change about the left carotid bulb without stenosis. Vertebral arteries: Both vertebral arteries arise from subclavian arteries. No significant proximal subclavian artery stenosis vertebral arteries are patent without stenosis or dissection. Skeleton: No  worrisome osseous lesions. Moderate to advanced multilevel cervical spondylosis, most pronounced at C6-7. Other neck: No other acute finding. Upper chest: No other acute finding. Review of the MIP images confirms the above findings CTA HEAD FINDINGS Anterior circulation: Atheromatous change about the carotid siphons without hemodynamically significant stenosis. A1 segments patent bilaterally. Normal anterior communicating artery complex. Atheromatous irregularity about the ACAs without proximal high-grade stenosis. Atheromatous irregularity about the M1 segments bilaterally with associated short-segment mild stenosis within the mid left M1 segment (series 12, image 21). No proximal MCA branch occlusion or high-grade stenosis. Distal MCA branches perfused and symmetric. Diffuse small vessel atheromatous irregularity noted. Posterior circulation: Vertebrobasilar dolichoectasia noted. Atheromatous irregularity about the ectatic V4 segments without high-grade stenosis. Neither PICA origin well visualized. Basilar is irregular and ectatic but patent without stenosis. Superior cerebral arteries patent bilaterally. Both PCAs primarily supplied via the basilar. Atheromatous irregularity about the PCAs without proximal high-grade stenosis. Venous sinuses: Patent allowing for timing the contrast bolus. Anatomic variants: None significant.  No aneurysm. Review of the MIP images confirms the above findings IMPRESSION: 1. Negative CTA for large vessel occlusion or other emergent finding. 2. Tortuosity of the major arterial vasculature of the head and neck with intracranial arterial dolichoectasia, suggesting chronic underlying hypertension. 3. Scattered atheromatous disease about the carotid bifurcations, carotid siphons, and intracranial circulation, but no hemodynamically significant or correctable stenosis. Aortic Atherosclerosis (ICD10-I70.0). Electronically Signed   By: Morene Hoard M.D.   On: 12/16/2023 02:05    CT HEAD WO CONTRAST Result Date: 12/15/2023 EXAM: CT HEAD WITHOUT 12/15/2023 10:48:44 PM TECHNIQUE: CT of the head was performed without the administration of intravenous contrast. Automated exposure control, iterative reconstruction, and/or weight based adjustment of the mA/kV was utilized to reduce the radiation dose to as low as reasonably achievable. COMPARISON: 12 / 19 / 19 CLINICAL HISTORY: Neuro deficit, acute, stroke suspected FINDINGS: BRAIN AND VENTRICLES: No acute intracranial hemorrhage. No mass effect or midline shift. No extra-axial fluid collection. No evidence of acute infarct. Scattered and confluent periventricular and subcortical white matter hypodense lesions, likely sequela of chronic microvascular ischemic change. Prominence of ventricles and sulci reflecting age-related volume loss. Calcified atherosclerotic plaque in cavernous/supraclinoid Internal Carotid Artery (ICA) and intradural vertebral arteries. ORBITS: Bilateral lens replacement noted. SINUSES AND MASTOIDS: No acute abnormality. SOFT TISSUES AND SKULL: No acute skull fracture. No acute soft tissue abnormality. IMPRESSION: 1. No acute intracranial abnormality. Electronically signed by: Norman Gatlin MD 12/15/2023 10:58 PM EST RP Workstation: [id number]    Alm Schneider, DO  [facility]  If 7PM-7AM, please contact night-coverage [email or web address] Password [email or web address] 12/16/2023, 8:15 AM   LOS: 0 days

## 2023-12-16 NOTE — ED Notes (Signed)
 Patient arrived to  2 A alert and oriented x 4. Abrasion noted to forehead. Reports a headache at site of abrasion. Denies pain elsewhere. Kellogg RN

## 2023-12-16 NOTE — ED Notes (Signed)
 Pt was brought in by granddaughter who states that pt and his wife went to nap around 6pm (LSN 6pm) and when his wife woke up pt was not there, she called family to help find him  they went to check nearby [employer] where they found pt but he was having slurred speech, slower to respond, not oriented.  They found that he had fallen and had been incontinent of urine.  Per security footage pt fell and struck head around 1930 and was helped up by stranger and got in car.  Pt states that he recalls fall and does not think that he was unconscious.  Pt states that his foot was caught in door and he fell and after fall he felt very weak all over and was not able to get up.

## 2023-12-16 NOTE — Progress Notes (Signed)
 Patient eating lunch. Will check back.       Celesta Gentile, RCS

## 2023-12-16 NOTE — Hospital Course (Addendum)
 83 year old male with a history of BPH, ulcerative colitis, DVT (01/07/23--finished Eliquis course 08/03/2023), duodenal ulcer presenting with right-sided weakness and slurred speech. The patient's spouse had woken up from a nap at 7:30 PM and noticed that the patient was not in the house.  She went to look for him and found him at the [employer] in his car slumped over the steering wheel.  In speaking with the patient, he states that he was trying to get out of his car at [employer] when he noticed some right leg weakness, and he had difficulty swinging his leg out of the car.  This resulted in him falling onto the pavement.  He was too weak to get up.  Bystanders helped the patient get back into the car.  According to the patient, he subsequently  fell asleep, and the next thing he remembered was his son-in-law trying to wake him up.  When the patient's family arrived, they found the patient slumped over the steering wheel.  The patient was having some slurred speech and had some right-sided weakness including his right leg and face.  Family came to see him and apparently performed some basic neurologic testing, and it seemed that the patient was having difficulty walking with right leg weakness and difficulty with finger-nose-finger on his right side.  The weakness symptoms improved, but the patient continued to have slurred speech and slurred speech.  He was slight answer questions.  The patient had recent hospital admission from 08/13/2023 to 08/17/2023 for small bowel obstruction.  The patient clinically improved with nonoperative management with steroids.  He was discharged with a steroid taper.

## 2023-12-16 NOTE — ED Notes (Signed)
 Patient transported to CT

## 2023-12-16 NOTE — Progress Notes (Signed)
*  PRELIMINARY RESULTS* Echocardiogram 2D Echocardiogram has been performed.  David Mooney 12/16/2023, 2:14 PM

## 2023-12-16 NOTE — TOC Initial Note (Signed)
 Transition of Care [facility]) - Initial/Assessment Note    Patient Details  Name: David Mooney MRN: [id number] Date of Birth: 05/14/1940  Transition of Care [facility]) CM/SW Contact:    Mcarthur Saddie Kim, LCSW Phone Number: 12/16/2023, 2:13 PM  Clinical Narrative: LCSW met with pt and family at bedside. Pt lives with his wife and is independent with ADLs at baseline. PT/OT evaluated pt and recommend HHPT. LCSW discussed with pt and wife and they report they need to think about it before making decision. TOC will follow up closer to d/c.                   Expected Discharge Plan: Home w Home Health Services Barriers to Discharge: Continued Medical Work up   Patient Goals and CMS Choice Patient states their goals for this hospitalization and ongoing recovery are:: return home   Choice offered to / list presented to : Patient [facility] ownership interest in [facility].provided to::  (n/a)    Expected Discharge Plan and Services In-house Referral: Clinical Social Work     Living arrangements for the past 2 months: Single Family Home                                      Prior Living Arrangements/Services Living arrangements for the past 2 months: Single Family Home Lives with:: Spouse Patient language and need for interpreter reviewed:: Yes Do you feel safe going back to the place where you live?: Yes      Need for Family Participation in Patient Care: No (Comment)   Current home services: DME (walker) Criminal Activity/Legal Involvement Pertinent to Current Situation/Hospitalization: No - Comment as needed  Activities of Daily Living   ADL Screening (condition at time of admission) Independently performs ADLs?: Yes (appropriate for developmental age) Is the patient deaf or have difficulty hearing?: Yes Does the patient have difficulty seeing, even when wearing glasses/contacts?: No Does the patient have difficulty concentrating, remembering, or making  decisions?: No  Permission Sought/Granted                  Emotional Assessment     Affect (typically observed): Appropriate Orientation: : Oriented to Self, Oriented to Place, Oriented to  Time, Oriented to Situation Alcohol  / Substance Use: Not Applicable Psych Involvement: No (comment)  Admission diagnosis:  TIA (transient ischemic attack) [G45.9] CVA (cerebral vascular accident) [facility]) [I63.9] Patient Active Problem List   Diagnosis Date Noted   CVA (cerebral vascular accident) (HCC) 12/16/2023   Right hemiparesis (HCC) 12/16/2023   TIA (transient ischemic attack) 12/16/2023   Crohn's ileocolitis (HCC) 09/12/2023   Peptic ulcer disease 08/15/2023   Small bowel obstruction (HCC) 08/15/2023   Right sided abdominal pain 08/14/2023   Crohn's disease of small intestine (HCC) 08/14/2023   SBO (small bowel obstruction) (HCC) 08/13/2023   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 07/09/2023   Acute esophagitis 04/09/2023   Duodenal ulcer 02/18/2023   Acute deep vein thrombosis (DVT) of femoral vein of left lower extremity (HCC) 01/11/2023   Esophagitis, unspecified with bleeding 01/04/2023   Abnormal CT of the abdomen 01/02/2023   Kidney stones 12/11/2022   Nocturia 03/28/2020   Urgency of urination 03/28/2020   Benign prostatic hyperplasia with urinary obstruction 03/28/2020   Anemia 10/22/2018   Fall 01/02/2018   GERD (gastroesophageal reflux disease) 09/15/2013   Flatulence 09/15/2013   Ulcerative  colitis (HCC) 07/24/2011   Glaucoma 07/24/2011   PCP:  Lari Elspeth BRAVO, MD Pharmacy:   [facility] [phone] - [place], [place] - [address] AT SEC OF [address] & [address] [place] [address] Phone: [phone] Fax: [phone]     Social Drivers of Health (SDOH) Social History: SDOH Screenings   Food Insecurity: No Food Insecurity (12/16/2023)  Housing: Low Risk  (12/16/2023)  Transportation Needs:  No Transportation Needs (12/16/2023)  Utilities: Not At Risk (12/16/2023)  Depression (PHQ2-9): Low Risk  (11/22/2023)  Social Connections: Socially Integrated (12/16/2023)  Tobacco Use: Low Risk  (12/15/2023)   SDOH Interventions:     Readmission Risk Interventions    08/17/2023    8:02 AM 08/14/2023    9:18 AM  Readmission Risk Prevention Plan  Medication Screening Complete Complete  Transportation Screening Complete Complete

## 2023-12-16 NOTE — Evaluation (Signed)
 Occupational Therapy Evaluation Patient Details Name: David Mooney MRN: [id number] DOB: 20-Oct-1940 Today's Date: 12/16/2023   History of Present Illness   David Mooney is a 83 y.o. male with medical history significant of BPH, ulcerative colitis, DVT, duodenal ulcer who presents to the emergency department due to acute onset of right-sided weakness and slurred speech.  Last known well was 6 PM yesterday, wife noted that it was not at home when she woke up from a short nap at 7:30 PM, she went looking for him and was seen at [employer] store's parking lot slumped over there wheeled.  He presented with a bruise on his head with suspicion that he has  probably fallen earlier.  He demonstrated right-sided weakness and some slurred speech.  EMS was activated and patient was taken to the ED for further evaluation. (per DO)     Clinical Impressions Pt agreeable to OT and PT co-evaluation. Pt demonstrates need for CGA to min A with labored movement for bed mobility. CGA to supervision needed for ambulatory transfers today. B UE WFL strength but noted difficulty with L UE gross motor coordination. Pt able to doff and don a sock without physical assist. Pt left in the chair with call bell within reach. Pt will benefit from continued OT in the hospital to increase strength, balance, and endurance for safe ADL's.        If plan is discharge home, recommend the following:   A little help with walking and/or transfers;A little help with bathing/dressing/bathroom;Assistance with cooking/housework;Assist for transportation;Help with stairs or ramp for entrance     Functional Status Assessment   Patient has had a recent decline in their functional status and demonstrates the ability to make significant improvements in function in a reasonable and predictable amount of time.     Equipment Recommendations   None recommended by OT             Precautions/Restrictions    Precautions Precautions: Fall Recall of Precautions/Restrictions: Intact Restrictions Weight Bearing Restrictions Per Provider Order: No     Mobility Bed Mobility Overal bed mobility: Needs Assistance Bed Mobility: Supine to Sit     Supine to sit: Contact guard, Min assist     General bed mobility comments: slow/labored movement    Transfers Overall transfer level: Needs assistance   Transfers: Sit to/from Stand, Bed to chair/wheelchair/BSC Sit to Stand: Contact guard assist, Supervision     Step pivot transfers: Contact guard assist, Supervision     General transfer comment: EOB to chair without AD      Balance Overall balance assessment: Mild deficits observed, not formally tested                                         ADL either performed or assessed with clinical judgement   ADL Overall ADL's : Needs assistance/impaired     Grooming: Supervision/safety;Contact guard assist;Standing   Upper Body Bathing: Set up;Modified independent;Sitting   Lower Body Bathing: Set up;Sitting/lateral leans   Upper Body Dressing : Modified independent;Set up;Sitting   Lower Body Dressing: Set up;Sitting/lateral leans   Toilet Transfer: Contact guard assist;Supervision/safety;Stand-pivot;Ambulation Toilet Transfer Details (indicate cue type and reason): EOB to chair and ambulation in the hall.         Functional mobility during ADLs: Contact guard assist;Supervision/safety General ADL Comments: Able to ambulate over 100 feet in the  hall without AD.     Vision Baseline Vision/History: 1 Wears glasses Ability to See in Adequate Light: 1 Impaired Patient Visual Report: No change from baseline Vision Assessment?: No apparent visual deficits     Perception Perception: Not tested       Praxis Praxis: Not tested       Pertinent Vitals/Pain Pain Assessment Pain Assessment: No/denies pain     Extremity/Trunk Assessment Upper Extremity  Assessment Upper Extremity Assessment: LUE deficits/detail LUE Deficits / Details: WFL strength but mild difficulty with gross motor coordination for finger to nose test. LUE Sensation: WNL LUE Coordination: decreased gross motor   Lower Extremity Assessment Lower Extremity Assessment: Defer to PT evaluation   Cervical / Trunk Assessment Cervical / Trunk Assessment: Normal   Communication Communication Communication: No apparent difficulties   Cognition Arousal: Alert Behavior During Therapy: WFL for tasks assessed/performed Cognition: No apparent impairments                               Following commands: Intact       Cueing  General Comments   Cueing Techniques: Verbal cues;Tactile cues                 Home Living Family/patient expects to be discharged to:: Private residence Living Arrangements: Spouse/significant other Available Help at Discharge: Family;Available 24 hours/day Type of Home: House Home Access: Stairs to enter [employer] of Steps: 2   Home Layout: One level     Bathroom Shower/Tub: Producer, Television/film/video: Standard Bathroom Accessibility: Yes How Accessible: Accessible via wheelchair;Accessible via walker Home Equipment: Rolling Walker (2 wheels);Shower seat   Additional Comments: Pt's wife has her own physical issues; daughter lives close by.      Prior Functioning/Environment Prior Level of Function : Independent/Modified Independent;Driving;History of Falls (last six months) (2 falls in 6 months)             Mobility Comments: Tourist information centre manager without AD ADLs Comments: Independent    OT Problem List: Impaired balance (sitting and/or standing);Decreased coordination   OT Treatment/Interventions: Self-care/ADL training;Therapeutic exercise;Therapeutic activities;Patient/family education;Balance training;Neuromuscular education      OT Goals(Current goals can be found in the care plan  section)   Acute Rehab OT Goals Patient Stated Goal: return home OT Goal Formulation: With patient Time For Goal Achievement: 12/30/23 Potential to Achieve Goals: Good   OT Frequency:  Min 1X/week    Co-evaluation PT/OT/SLP Co-Evaluation/Treatment: Yes Reason for Co-Treatment: To address functional/ADL transfers   OT goals addressed during session: ADL's and self-care      AM-PAC OT 6 Clicks Daily Activity     Outcome Measure Help from another person eating meals?: None Help from another person taking care of personal grooming?: A Little Help from another person toileting, which includes using toliet, bedpan, or urinal?: A Little Help from another person bathing (including washing, rinsing, drying)?: A Little Help from another person to put on and taking off regular upper body clothing?: None Help from another person to put on and taking off regular lower body clothing?: A Little 6 Click Score: 20   End of Session    Activity Tolerance: Patient tolerated treatment well Patient left: in chair;with call bell/phone within reach  OT Visit Diagnosis: Unsteadiness on feet (R26.81);Other abnormalities of gait and mobility (R26.89);Muscle weakness (generalized) (M62.81);History of falling (Z91.81);Other symptoms and signs involving the nervous system (R29.898)  Time: 9141-9071 OT Time Calculation (min): 30 min Charges:  OT General Charges $OT Visit: 1 Visit OT Evaluation $OT Eval Low Complexity: 1 Low  Rashan Patient OT, MOT  Jayson Person 12/16/2023, 1:17 PM

## 2023-12-16 NOTE — Plan of Care (Signed)
  Problem: Acute Rehab PT Goals(only PT should resolve) Goal: Patient Will Perform Sitting Balance Outcome: Progressing Flowsheets (Taken 12/16/2023 1413) Patient will perform sitting balance:  with modified independence  with supervision Goal: Patient Will Transfer Sit To/From Stand Outcome: Progressing Flowsheets (Taken 12/16/2023 1413) Patient will transfer sit to/from stand:  with modified independence  with supervision Goal: Pt Will Transfer Bed To Chair/Chair To Bed Outcome: Progressing Flowsheets (Taken 12/16/2023 1413) Pt will Transfer Bed to Chair/Chair to Bed:  with modified independence  with supervision Goal: Pt Will Perform Standing Balance Or Pre-Gait Outcome: Progressing Flowsheets (Taken 12/16/2023 1413) Pt will perform standing balance or pre-gait:  with Modified Independent  with Supervision Goal: Pt Will Ambulate Outcome: Progressing Flowsheets (Taken 12/16/2023 1413) Pt will Ambulate:  75 feet  100 feet  with modified independence  with supervision Goal: Pt Will Go Up/Down Stairs Outcome: Progressing Flowsheets (Taken 12/16/2023 1413) Pt will Go Up / Down Stairs:  with contact guard assist  with supervision  1-2 stairs  with rail(s)  Mouhamad Teed, SPT

## 2023-12-16 NOTE — Consult Note (Signed)
 I connected with  Ezzard LELON Mace on 12/16/23 by a video enabled telemedicine application and verified that I am speaking with the correct person using two identifiers.   I discussed the limitations of evaluation and management by telemedicine. The patient expressed understanding and agreed to proceed.  NEUROLOGY CONSULT NOTE   Date of service: December 16, 2023 Patient Name: David Mooney MRN:  [id number] DOB:  12-29-1940 Chief Complaint: Transient RLE weakness Requesting Provider: Evonnie Lenis, MD  History of Present Illness  RONELLE SMALLMAN is a 83 y.o. male with hx of BPH, Crohn's, GI ulcers who presented to [facility] ED for encephalopathy and RLE weakness.  Information obtained from son-in-law Glendia, daughter Eleanor, and patient.  On 12/1, patient was last seen by son around 230pm. He then drove off to run some errands. Wife noticed he had not come home after a few hours, so called her daughter and son-in-law, who then went to look for patient. Reports patient only has a few places he frequents so it was not difficult to find him.  In addition to son-in-law's review of video footage of grocery store parking lot, the following occurred:  Around 730pm, patient seen on camera getting out of his truck and having some difficulty and falling down. A car came up and helped the patient up however it is not clear how the patient was moving as the bystander's car was obstructing the camera view.   The patient reports in great detail the manner in which he got out of the car and fell. Reports no problem with swinging his left leg out of the car, but when he tried to do so with his right leg he could not. He felt like it was stuck but he could not move it to unlodge it. He remembers trying to rotate his body but thinks he overcalculated how to because he rolled around and around onto and on the ground and hitting his head. He then does not remember what happened next.  On camera, the bystander leaves the  patient in the car and drives off. For 2 hours, there is no movement seen in the vehicle by the patient, until the son-in-law arrives and finds patient slumped over. The patient appears to be confused, asking where his daughter and wife are, as if he had been waiting for them the entire time. Son-in-law tries to help patient out but noticed his RLE was stuck in between the seat and something else. He is able to help the patient out and the patient is able to stand. He is wobbly, but more so in a confused manner than his RLE being weak.   Granddaughter who is an EMT came and performed stroke assessment. She noted a R lower facial droop and difficulty with R finger to nose.   He was then seen in the [facility] ED, at which point exam was notable for slightly slurred speech and delayed response time.  Currently, patient does not feel he has any symptoms. He is not sure when his weakness cleared.   Of note son in law reports gradual decline in mental status. Patient is slow to answer questions. At times will stare at you for about 15-20 seconds before responding, however when he does the response is appropriate. Has forgotten minor things like locations of keys, the dates of appointments. There is concern that maybe he got lost a few times driving because he will take an odd route back home with the excuse that he just  felt like it.    LKW: 11/30, 230pm Modified rankin score: 0-Completely asymptomatic and back to baseline post- stroke IV Thrombolysis:  No (resolved, too minor to treat, out of window) EVT: No (resolved, too minor to treat, out of window) ICH Score: N/A  NIHSS components Score: Comment  1a Level of Conscious 0[x]  1[]  2[]  3[]      1b LOC Questions 0[x]  1[]  2[]       1c LOC Commands 0[x]  1[]  2[]       2 Best Gaze 0[x]  1[]  2[]       3 Visual 0[x]  1[]  2[]  3[]      4 Facial Palsy 0[x]  1[]  2[]  3[]      5a Motor Arm - left 0[x]  1[]  2[]  3[]  4[]  UN[]    5b Motor Arm - Right 0[x]  1[]  2[]  3[]  4[]  UN[]     6a Motor Leg - Left 0[x]  1[]  2[]  3[]  4[]  UN[]    6b Motor Leg - Right 0[x]  1[]  2[]  3[]  4[]  UN[]    7 Limb Ataxia 0[x]  1[]  2[]  UN[]      8 Sensory 0[x]  1[]  2[]  UN[]      9 Best Language 0[x]  1[]  2[]  3[]      10 Dysarthria 0[x]  1[]  2[]  UN[]      11 Extinct. and Inattention 0[x]  1[]  2[]       TOTAL: 0      ROS  Comprehensive ROS performed and pertinent positives documented in HPI    Past History   Past Medical History:  Diagnosis Date   Arthritis    Cataract    Complication of anesthesia    Embolus (HCC) 12/2022   left Hip   GERD (gastroesophageal reflux disease)    Glaucoma    Hernia of unspecified site of abdominal cavity without mention of obstruction or gangrene    History of gastric ulcer    History of kidney stones    PONV (postoperative nausea and vomiting)    Ulcerative colitis     Past Surgical History:  Procedure Laterality Date   BIOPSY  12/04/2018   Procedure: BIOPSY;  Surgeon: Golda Claudis PENNER, MD;  Location: [facility];  Service: Endoscopy;;  cecum   COLONOSCOPY     COLONOSCOPY N/A 08/27/2012   Procedure: COLONOSCOPY;  Surgeon: Claudis PENNER Golda, MD;  Location: [facility];  Service: Endoscopy;  Laterality: N/A;  1200-moved to 13:15 Ann to notify pt   COLONOSCOPY N/A 12/04/2018   Procedure: COLONOSCOPY;  Surgeon: Golda Claudis PENNER, MD;  Location: [facility];  Service: Endoscopy;  Laterality: N/A;  155pm   COLONOSCOPY N/A 10/01/2023   Procedure: COLONOSCOPY;  Surgeon: Eartha Angelia Sieving, MD;  Location: [facility];  Service: Gastroenterology;  Laterality: N/A;  8:15 am, asa 3   CYSTOSCOPY WITH INSERTION OF UROLIFT N/A 04/30/2022   Procedure: CYSTOSCOPY WITH INSERTION OF UROLIFT;  Surgeon: Sherrilee Belvie CROME, MD;  Location: [facility];  Service: Urology;  Laterality: N/A;   ESOPHAGOGASTRODUODENOSCOPY N/A 07/09/2023   Procedure: EGD (ESOPHAGOGASTRODUODENOSCOPY);  Surgeon: Eartha Angelia, Sieving, MD;  Location: [facility];  Service: Gastroenterology;   Laterality: N/A;  7:30AM;ASA 3   ESOPHAGOGASTRODUODENOSCOPY (EGD) WITH PROPOFOL   01/04/2023   Procedure: ESOPHAGOGASTRODUODENOSCOPY (EGD) WITH PROPOFOL ;  Surgeon: Cindie Carlin POUR, DO;  Location: [facility];  Service: Endoscopy;;   ESOPHAGOGASTRODUODENOSCOPY (EGD) WITH PROPOFOL  N/A 04/09/2023   Procedure: ESOPHAGOGASTRODUODENOSCOPY (EGD) WITH PROPOFOL ;  Surgeon: Eartha Angelia Sieving, MD;  Location: [facility];  Service: Gastroenterology;  Laterality: N/A;  7:30am;asa 3   HERNIA REPAIR Left  MOLE REMOVAL     From head , chest   UPPER GASTROINTESTINAL ENDOSCOPY      Family History: Family History  Problem Relation Age of Onset   Arrhythmia Mother    Anuerysm Father    Hypertension Father    Diabetes Father    Healthy Sister    Healthy Sister    Irritable bowel syndrome Daughter    Thyroid  disease Daughter     Social History  reports that he has never smoked. He has never used smokeless tobacco. He reports that he does not drink alcohol  and does not use drugs.  No Known Allergies  Medications   Current Facility-Administered Medications:    [START ON 12/17/2023]  stroke: early stages of recovery book, , Does not apply, Once, Adefeso, Oladapo, DO   0.9 %  sodium chloride  infusion, , Intravenous, Continuous, Adefeso, Oladapo, DO, Last Rate: 40 mL/hr at 12/16/23 0214, New Bag at 12/16/23 0214   acetaminophen  (TYLENOL ) tablet 650 mg, 650 mg, Oral, Q4H PRN, 650 mg at 12/16/23 1549 **OR** acetaminophen  (TYLENOL ) 160 MG/5ML solution 650 mg, 650 mg, Per Tube, Q4H PRN **OR** acetaminophen  (TYLENOL ) suppository 650 mg, 650 mg, Rectal, Q4H PRN, Adefeso, Oladapo, DO   aspirin chewable tablet 81 mg, 81 mg, Oral, Daily, Tat, David, MD, 81 mg at 12/16/23 1825   atorvastatin (LIPITOR) tablet 20 mg, 20 mg, Oral, Daily, Adefeso, Oladapo, DO, 20 mg at 12/16/23 0859   clopidogrel (PLAVIX) tablet 75 mg, 75 mg, Oral, Daily, Tat, David, MD, 75 mg at 12/16/23 1825   finasteride  (PROSCAR ) tablet  5 mg, 5 mg, Oral, Daily, Adefeso, Oladapo, DO, 5 mg at 12/16/23 0900   mirabegron  ER (MYRBETRIQ ) tablet 50 mg, 50 mg, Oral, Daily, Adefeso, Oladapo, DO, 50 mg at 12/16/23 0859   pantoprazole  (PROTONIX ) EC tablet 80 mg, 80 mg, Oral, Q1200, Adefeso, Oladapo, DO, 80 mg at 12/16/23 0900   senna-docusate (Senokot-S) tablet 1 tablet, 1 tablet, Oral, QHS PRN, Adefeso, Oladapo, DO   sucralfate  (CARAFATE ) 1 GM/10ML suspension 1 g, 1 g, Oral, TID AC & HS, Tat, David, MD, 1 g at 12/16/23 2109  Vitals   Vitals:   12/16/23 0158 12/16/23 0528 12/16/23 1216 12/16/23 1910  BP: 139/75 (!) 92/51 (!) 140/66 130/70  Pulse: 76     Resp:   18 18  Temp: 98.8 F (37.1 C) 98.1 F (36.7 C) 98.1 F (36.7 C) 98.2 F (36.8 C)  TempSrc: Oral Oral Oral Oral  SpO2: 98%  100%   Weight: 77.1 kg     Height: 5' 6 (1.676 m)       Body mass index is 27.44 kg/m.   Physical Exam  Performed with assistance of RN at bedside  Constitutional: Appears well-developed and well-nourished.  Psych: Affect appropriate to situation.  Eyes: No scleral injection.  HENT: No OP obstruction.  Head: Normocephalic.  Cardiovascular: Appears well perfused Respiratory: Effort normal, non-labored breathing.  GI: No distension Skin: WDI.   Neurologic Examination  Cranial nerves: PERRL EOMI without nystagmus or diplopia Face symmetric at rest and with activation Face sensation intact V1-V3 symmetrically. Hearing grossly intact. Tongue midline with full ROM.   Strength: R-handed No abnormal movements such as tremor, myoclonus. No pronator drift or drift of LE. 4/5 L delt, 5/5 R delt 5/5 biceps, triceps, hand grip 5/5 hip flexion, dorsiflexion, plantarflexion  Sensory: Sensation intact to light touch in UE and LE.  Reflexes: Deferred  Coordination: No ataxia with reaching for items.  Labs/Imaging/Neurodiagnostic studies  CBC:  Recent Labs  Lab 12/15/23 2240 12/15/23 2242 12/16/23 0430  WBC 10.2  --  7.3   NEUTROABS 8.1*  --   --   HGB 12.0* 11.6* 10.2*  HCT 36.4* 34.0* 31.2*  MCV 91.0  --  91.2  PLT 172  --  145*   Basic Metabolic Panel:  Lab Results  Component Value Date   NA 140 12/16/2023   K 3.4 (L) 12/16/2023   CO2 24 12/16/2023   GLUCOSE 149 (H) 12/16/2023   BUN 13 12/16/2023   CREATININE 1.01 12/16/2023   CALCIUM 9.2 12/16/2023   GFRNONAA >60 12/16/2023   GFRAA 69 09/16/2018   Lipid Panel:  Lab Results  Component Value Date   LDLCALC 52 12/16/2023   HgbA1c: No results found for: HGBA1C Urine Drug Screen: No results found for: LABOPIA, COCAINSCRNUR, LABBENZ, AMPHETMU, THCU, LABBARB  Alcohol  Level     Component Value Date/Time   ETH <15 12/15/2023 2240   INR  Lab Results  Component Value Date   INR 1.1 12/15/2023   APTT  Lab Results  Component Value Date   APTT 30 12/15/2023   AED levels: No results found for: PHENYTOIN, ZONISAMIDE, LAMOTRIGINE, LEVETIRACETA  CT Head without contrast: No acute abnormality  CT angio Head and Neck with contrast: 1. Negative CTA for large vessel occlusion or other emergent finding. 2. Tortuosity of the major arterial vasculature of the head and neck with intracranial arterial dolichoectasia, suggesting chronic underlying hypertension. 3. Scattered atheromatous disease about the carotid bifurcations, carotid siphons, and intracranial circulation, but no hemodynamically significant or correctable stenosis.  MRI Brain w/o: 1. No acute intracranial abnormality. 2. Mild chronic small vessel ischemic disease.  Neurodiagnostics rEEG:  No focal epileptiform discharges or slowing or seizure.  ASSESSMENT   MCCLELLAN DEMARAIS is a 83 y.o. male with PMH of Crohn's, GI ulcers, BPH who presents after transient right-sided weakness and encephalopathy.  Considered TIA vs seizure due to episode's transient nature and MRI negative for stroke. Patient's ability to recount details up until his head strike makes  TIA more likely. Amnesia of events after and confusion could likely be due to his head strike. Patient's older age and questionable cognitive impairment/memory issues would make him more susceptible to encephalopathy. EEG was also without any focality. Therefore, recommend treating as TIA.  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 Due to history of Crohn's and remote history of mild bleeding in his stool, would refrain from DAPT'ing for 90 days as per SAMMPRIS and instead limit to 21 days.   A1C in process LDL 52 Echo without thrombus Telemetry so far without afib  RECOMMENDATIONS  - ASA 81mg  and plavix 75mg  for a total of 21 days  - Daughter and patient educated on if they see dark maroon or blood in stool to stop DAPT and let PCP know - Continue ator 20mg  (as age > 76yo, defer on high intensity statin) - If telemetry shows afib, will need to consider anticoagulation - If A1C returns diabetic, defer to PCP for management ______________________________________________________________________    Signed, Normie CHRISTELLA Blower, MD [facility]

## 2023-12-16 NOTE — Plan of Care (Signed)
  Problem: Acute Rehab OT Goals (only OT should resolve) Goal: Pt. Will Perform Grooming Flowsheets (Taken 12/16/2023 1320) Pt Will Perform Grooming:  with modified independence  standing Goal: Pt. Will Transfer To Toilet Flowsheets (Taken 12/16/2023 1320) Pt Will Transfer to Toilet:  with modified independence  ambulating Goal: Pt/Caregiver Will Perform Home Exercise Program Flowsheets (Taken 12/16/2023 1320) Pt/caregiver will Perform Home Exercise Program:  Left upper extremity gross motor coordination  Independently  Tamela Elsayed OT, MOT

## 2023-12-16 NOTE — Progress Notes (Signed)
 EEG complete - results pending

## 2023-12-16 NOTE — Procedures (Signed)
 Patient Name: David Mooney  MRN: [id number]  Epilepsy Attending: Pastor Falling  Referring Physician/Provider: Sallyann Normie HERO, MD      Date: 12/16/2023 Duration: 22 minutes   Patient history: 83 year old man found in his car in the parking lot with weakness. EEG to evaluate for seizure.  Level of alertness: Awake, drowsy  AEDs during EEG study: None   Technical aspects: This EEG study was done with scalp electrodes positioned according to the 10-20 International system of electrode placement. Electrical activity was reviewed with band pass filter of 1-70Hz , sensitivity of 7 uV/mm, display speed of 33mm/sec with a 60Hz  notched filter applied as appropriate. EEG data were recorded continuously and digitally stored.  Video monitoring was available and reviewed as appropriate.  Description: The posterior dominant rhythm consists of 8-9 Hz activity of moderate voltage (25-35 uV) seen predominantly in posterior head regions, symmetric and reactive to eye opening and eye closing. Drowsiness was characterized by attenuation of the posterior background rhythm. Sleep was not seen.  There was additional period of excessive amount of 15 to 18 Hz, 2-3 uV beta activity distributed symmetrically and diffusely. Hyperventilation and photic stimulation were not performed.     ABNORMALITY - Excessive beta, generalized   IMPRESSION: This study is within normal limits. No seizures or epileptiform discharges were seen throughout the recording. The excessive beta activity seen in the background is most likely due to the effect of benzodiazepine and is a benign EEG pattern. A normal interictal EEG does not exclude nor support the diagnosis of epilepsy.   Pastor Falling MD Neurology

## 2023-12-16 NOTE — Consult Note (Signed)
 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 His symptoms improved, and his neurologic exam was only notable for mild dysarthria. Overall, the acuity of onset and focality of symptoms are concerning for possible resolving stroke/TIA. May also consider seizure with post-ictal deficits or other focal CNS etiologies as well. He is not a candidate for thrombolytics as LKW >4.5 hours, and there is low clinical suspicion for proximal LVO. Recommend initiation of ASA and admission for further stroke workup as discussed below.   Recommendations: Our recommendations are outlined below.  Diagnostic Studies : Routine MRI brain without contrast to evaluate for acute stroke Routine CTA head and neck to evaluate for large artery atherosclerotic disease; may alternatively obtain MRA head/neck without contrast if preferred TTE to assess for intracardiac abnormalities Telemetry to monitor for occult arrhythmias Lipid panel and hemoglobin A1c to assess for modifiable stroke risk factors  Medication : Please load with ASA 325 mg followed by 81 mg daily thereafter Would hold off on starting Plavix too in this case due to head injury and older age (to minimize risk of ICH/SDH) Statins for LDL goal less than 70 BP goal may be <180/105 given improvement in symptoms and recent head injury  Nursing Recommendations : IV Fluids, avoid dextrose  containing fluids Maintain euglycemia Neuro checks q4 hrs x 24 hrs and then per shift Head of bed 30 degrees Continue with Telemetry  Consultations : Recommend Speech therapy if failed dysphagia  screen Physical therapy/Occupational therapy  DVT Prophylaxis : Choice of Primary Team  Neurology will follow  ----------------------------------------------------------------------------------------------------    Metrics: Callback Response Time: 12/15/2023 22:40:46  Primary Provider Notified of Diagnostic Impression and Management Plan on: 12/16/2023 00:10:26  Imaging CT head reviewed without any acute process   ----------------------------------------------------------------------------------------------------  Chief Complaint: right-sided weakness and slurred speech  History of Present Illness: Patient is a 83 year old Male. Patient was last known normal around 6 PM. He was later found by family to be slumped over in his car with slurred speech and a right facial droop. Patient also reported that he fell when trying to get out of his car because he felt his RLE getting caught on something to the point where he had trouble maneuvering that leg. He notes that he his his head during the fall. He denied any history of similar symptoms and further denied any associated headache, vision changes, or left-sided deficits. Other than mild slurred/slowed speech, patient reported that his symptoms had otherwise resolved.   Past Medical History:      Hyperlipidemia      There is no history of Stroke      There is no history of Seizures Other PMH:  Crohn's disease, DVT  Medications:  No Anticoagulant use  No Antiplatelet use Reviewed EMR for current medications  Allergies:  Reviewed,NKDA  Social History: Smoking: No  Family History:  There is no family history of premature cerebrovascular disease pertinent to this consultation  ROS : 14 Points Review of Systems was performed and was negative except mentioned in HPI.  Past Surgical History: There Is No Surgical History Contributory To Today's Visit   Examination: BP(148/68),  Pulse(81), 1A: Level of Consciousness -  Alert; keenly responsive + 0 1B: Ask Month and Age - Both Questions Right + 0 1C: Blink Eyes & Squeeze Hands - Performs Both Tasks + 0 2: Test Horizontal Extraocular Movements - Normal + 0 3: Test Visual Fields - No Visual Loss + 0 4: Test Facial Palsy (Use Grimace if Obtunded) - Normal symmetry + 0 5A: Test Left Arm Motor Drift - No Drift for 10 Seconds + 0 5B: Test Right Arm Motor Drift - No Drift for 10 Seconds + 0 6A: Test Left Leg Motor Drift - No Drift for 5 Seconds + 0 6B: Test Right Leg Motor Drift - No Drift for 5 Seconds + 0 7: Test Limb Ataxia (FNF/Heel-Shin) - No Ataxia + 0 8: Test Sensation - Normal; No sensory loss + 0 9: Test Language/Aphasia - Normal; No aphasia + 0 10: Test Dysarthria - Mild-Moderate Dysarthria: Slurring but can be understood + 1 11: Test Extinction/Inattention - No abnormality + 0  NIHSS Score: 1  Spoke with : Dr. Lorette  This consult was conducted in real time using interactive audio and immunologist. Patient was informed of the technology being used for this visit and agreed to proceed. Patient located in hospital and provider located at home/office setting.  Patient is being evaluated for possible acute neurologic impairment and high probability of imminent or life - threatening deterioration.I spent total of 30 minutes providing care to this patient, including time for face to face visit via telemedicine, review of medical records, imaging studies and discussion of findings with providers, the patient and / or family.  Dr Eva De'Prey  [facility] For Inpatient follow-up with [facility] physician please call [facility] at [phone]. As we are not an outpatient service for any post hospital discharge needs please contact the hospital for assistance.  If you have any questions for the [facility] physicians or need to reconsult for clinical or diagnostic changes please contact us  via [facility] at [phone].  Non-radiologist review of  imaging performed to assist with emergent clinical decision-making. Remote physician workstations do not possess the same resolution, calibration, or diagnostic capabilities as hospital-based radiology reading stations, and formal radiologist read is necessary.

## 2023-12-17 ENCOUNTER — Other Ambulatory Visit: Payer: Self-pay

## 2023-12-17 DIAGNOSIS — G459 Transient cerebral ischemic attack, unspecified: Secondary | ICD-10-CM | POA: Diagnosis not present

## 2023-12-17 DIAGNOSIS — G3184 Mild cognitive impairment, so stated: Secondary | ICD-10-CM | POA: Diagnosis not present

## 2023-12-17 DIAGNOSIS — G8191 Hemiplegia, unspecified affecting right dominant side: Secondary | ICD-10-CM | POA: Diagnosis not present

## 2023-12-17 LAB — HEMOGLOBIN A1C
Hgb A1c MFr Bld: 5.5 % (ref 4.8–5.6)
Mean Plasma Glucose: 111 mg/dL

## 2023-12-17 MED ORDER — POTASSIUM CHLORIDE CRYS ER 20 MEQ PO TBCR
30.0000 meq | EXTENDED_RELEASE_TABLET | Freq: Once | ORAL | Status: AC
  Administered 2023-12-17: 30 meq via ORAL
  Filled 2023-12-17: qty 2

## 2023-12-17 MED ORDER — ESOMEPRAZOLE MAGNESIUM 40 MG PO CPDR: 40.0000 mg | DELAYED_RELEASE_CAPSULE | Freq: Two times a day (BID) | ORAL | Status: DC

## 2023-12-17 MED ORDER — CLOPIDOGREL BISULFATE 75 MG PO TABS: 75.0000 mg | ORAL_TABLET | Freq: Every day | ORAL | 0 refills | Status: DC

## 2023-12-17 MED ORDER — ASPIRIN 81 MG PO CHEW: 81.0000 mg | CHEWABLE_TABLET | Freq: Every day | ORAL | Status: AC

## 2023-12-17 NOTE — TOC Transition Note (Signed)
 Transition of Care [facility]) - Discharge Note   Patient Details  Name: David Mooney MRN: [id number] Date of Birth: 02-Aug-1940  [facility] CM/SW Contact:  Mcarthur Saddie Kim, LCSW Phone Number: 12/17/2023, 8:31 AM   Clinical Narrative: Discussed HHPT/OT with pt again this morning and he is agreeable. No preference on agency. Referred and accepted by [facility] with Hed[facility]. MD notified for orders. Cory aware of d/c today.       Final next level of care: Home/Self Care Barriers to Discharge: Barriers Resolved   Patient Goals and CMS Choice Patient states their goals for this hospitalization and ongoing recovery are:: return home   Choice offered to / list presented to : Patient [place] ownership interest in [facility].provided to::  (n/a)    Discharge Placement                       Discharge Plan and Services Additional resources added to the After Visit Summary for   In-house Referral: Clinical Social Work                        HH Arranged: PT, OT HH Agency: [facility] Date [facility] Agency Contacted: 12/17/23 Time HH Agency Contacted: [phone] Representative spoke with at [facility] Agency: Darleene  Social Drivers of Health (SDOH) Interventions SDOH Screenings   Food Insecurity: No Food Insecurity (12/16/2023)  Housing: Low Risk  (12/16/2023)  Transportation Needs: No Transportation Needs (12/16/2023)  Utilities: Not At Risk (12/16/2023)  Depression (PHQ2-9): Low Risk  (11/22/2023)  Social Connections: Socially Integrated (12/16/2023)  Tobacco Use: Low Risk  (12/15/2023)     Readmission Risk Interventions    08/17/2023    8:02 AM 08/14/2023    9:18 AM  Readmission Risk Prevention Plan  Medication Screening Complete Complete  Transportation Screening Complete Complete

## 2023-12-17 NOTE — Discharge Summary (Addendum)
 Physician Discharge Summary   Patient: David Mooney MRN: [id number] DOB: 1940-05-12  Admit date:     12/15/2023  Discharge date: 12/17/23  Discharge Physician: Alm Margarethe Virgen   PCP: Lari Elspeth BRAVO, MD   Recommendations at discharge:   Please follow up with primary care provider within 1-2 weeks  Please repeat BMP and CBC in one week      Hospital Course: 83 year old male with a history of BPH, ulcerative colitis, DVT (01/07/23--finished Eliquis course 08/03/2023), duodenal ulcer presenting with right-sided weakness and slurred speech. The patient's spouse had woken up from a nap at 7:30 PM and noticed that the patient was not in the house.  She went to look for him and found him at the [employer] in his car slumped over the steering wheel.  In speaking with the patient, he states that he was trying to get out of his car at [employer] when he noticed some right leg weakness, and he had difficulty swinging his leg out of the car.  This resulted in him falling onto the pavement.  He was too weak to get up.  Bystanders helped the patient get back into the car.  According to the patient, he subsequently  fell asleep, and the next thing he remembered was his son-in-law trying to wake him up.  When the patient's family arrived, they found the patient slumped over the steering wheel.  The patient was having some slurred speech and had some right-sided weakness including his right leg and face.  Family came to see him and apparently performed some basic neurologic testing, and it seemed that the patient was having difficulty walking with right leg weakness and difficulty with finger-nose-finger on his right side.  The weakness symptoms improved, but the patient continued to have slurred speech and slurred speech.  He was slight answer questions.  The patient had recent hospital admission from 08/13/2023 to 08/17/2023 for small bowel obstruction.  The patient clinically improved with  nonoperative management with steroids.  He was discharged with a steroid taper.    Assessment and Plan: Right hemiparesis/transient loss of consciousness -Appreciate Neurology Consult>>felt pt presentation most consistent with TIA -PT/OT evaluation -Speech therapy eval -CT brain--negative -CTA HH&N--negative for LVO, no hemodynamically significant stenosis.  Scattered SVT intracranial circulation -MRI brain--neg for acute findings -Echo--EF 65-70%, no WMA, G1DD, mod AS -LDL--52 -HbA1C--pending at time of dc -Antiplatelet--ASA 81 mg & plavix x 21 days, then ASA 81 mg thereafter - Obtain EEG--no seizure   BPH - Continue Myrbetriq  and finasteride    Mixed hyperlipidemia - Continue statin   GERD/duodenal ulcer - Continue Carafate  - Continue PPI   Ulcerative colitis - Patient is on Tremfya  q. 28 days - No longer taking mesalamine   Hypokalemia -repleted  Mild Cognitive Impairment -family endorses that pt has been lost while driving and occasionally slow to respond -outpt referral for neuropsych eval   Consultants: neurology Procedures performed: none  Disposition: Home Diet recommendation:  Cardiac diet DISCHARGE MEDICATION: Allergies as of 12/17/2023   No Known Allergies      Medication List     STOP taking these medications    clindamycin 300 MG capsule Commonly known as: CLEOCIN   sulfamethoxazole-trimethoprim 800-160 MG tablet Commonly known as: BACTRIM DS       TAKE these medications    acetaminophen  650 MG CR tablet Commonly known as: TYLENOL  Take 1,300 mg by mouth 2 (two) times daily.   aspirin 81 MG chewable tablet Chew 1 tablet (  81 mg total) by mouth daily.   atorvastatin  20 MG tablet Commonly known as: LIPITOR Take 20 mg by mouth daily.   clopidogrel  75 MG tablet Commonly known as: PLAVIX  Take 1 tablet (75 mg total) by mouth daily. Start taking on: December 18, 2023   esomeprazole  40 MG capsule Commonly known as: NEXIUM  Take 1  capsule (40 mg total) by mouth 2 (two) times daily before a meal. Do NOT take until done with 20 days of plavix  What changed: See the new instructions.   finasteride  5 MG tablet Commonly known as: PROSCAR  Take 1 tablet (5 mg total) by mouth daily. TAKE 1 TABLET(5 MG) BY MOUTH DAILY   mirabegron  ER 50 MG Tb24 tablet Commonly known as: MYRBETRIQ  Take 1 tablet (50 mg total) by mouth daily.   Mucinex Maximum Strength 1200 MG Tb12 Generic drug: Guaifenesin Take 1 tablet by mouth 2 (two) times daily as needed (chest congestion).   multivitamin tablet Take 1 tablet by mouth daily.   sucralfate  1 GM/10ML suspension Commonly known as: CARAFATE  Take 10 mLs (1 g total) by mouth 4 (four) times daily -  before meals and at bedtime.   tamsulosin  0.4 MG Caps capsule Commonly known as: FLOMAX  Take 1 capsule (0.4 mg total) by mouth daily.   Tremfya  Pen 200 MG/2ML Soaj Generic drug: Guselkumab  Inject 200 mg into the skin every 28 (twenty-eight) days.        Discharge Exam: Filed Weights   12/16/23 0158  Weight: 77.1 kg   HEENT:  [place]/AT, No thrush, no icterus CV:  RRR, no rub, no S3, no S4 Lung:  CTA, no wheeze, no rhonchi Abd:  soft/+BS, NT Ext:  No edema, no lymphangitis, no synovitis, no rash Neuro:  CN II-XII intact, strength 4/5 in RUE, RLE, strength 4/5 LUE, LLE; sensation intact bilateral; no dysmetria; babinski equivocal   Condition at discharge: stable  The results of significant diagnostics from this hospitalization (including imaging, microbiology, ancillary and laboratory) are listed below for reference.   Imaging Studies: EEG adult Result Date: 12/16/2023 Gregg Lek, MD     12/16/2023  4:34 PM Patient Name: JAIVYN GULLA MRN: [id number] Epilepsy Attending: Lek Gregg Referring Physician/Provider: Sallyann Normie HERO, MD     Date: 12/16/2023 Duration: 22 minutes Patient history: 83 year old man found in his car in the parking lot with weakness. EEG to evaluate for seizure.  Level of alertness: Awake, drowsy AEDs during EEG study: None Technical aspects: This EEG study was done with scalp electrodes positioned according to the 10-20 International system of electrode placement. Electrical activity was reviewed with band pass filter of 1-70Hz , sensitivity of 7 uV/mm, display speed of 10mm/sec with a 60Hz  notched filter applied as appropriate. EEG data were recorded continuously and digitally stored.  Video monitoring was available and reviewed as appropriate. Description: The posterior dominant rhythm consists of 8-9 Hz activity of moderate voltage (25-35 uV) seen predominantly in posterior head regions, symmetric and reactive to eye opening and eye closing. Drowsiness was characterized by attenuation of the posterior background rhythm. Sleep was not seen.  There was additional period of excessive amount of 15 to 18 Hz, 2-3 uV beta activity distributed symmetrically and diffusely. Hyperventilation and photic stimulation were not performed.   ABNORMALITY - Excessive beta, generalized IMPRESSION: This study is within normal limits. No seizures or epileptiform discharges were seen throughout the recording. The excessive beta activity seen in the background is most likely due to the effect of benzodiazepine and is  a benign EEG pattern. A normal interictal EEG does not exclude nor support the diagnosis of epilepsy. Pastor Falling MD Neurology    ECHOCARDIOGRAM COMPLETE Result Date: 12/16/2023    ECHOCARDIOGRAM REPORT   Patient Name:   ERNESTINE LANGWORTHY Date of Exam: 12/16/2023 Medical Rec #:  [id number]       Height:       66.0 in Accession #:    [id number]      Weight:       170.0 lb Date of Birth:  04-06-1940       BSA:          1.866 m Patient Age:    83 years        BP:           92/51 mmHg Patient Gender: M               HR:           76 bpm. Exam Location:  [facility] Procedure: 2D Echo, Cardiac Doppler and Color Doppler (Both Spectral and Color            Flow Doppler were utilized during  procedure). Indications:    Stroke l63.9  History:        Patient has no prior history of Echocardiogram examinations.                 TIA, Stroke and DVT.  Sonographer:    Aida Pizza RCS Referring Phys: [id number] OLADAPO ADEFESO IMPRESSIONS  1. Left ventricular ejection fraction, by estimation, is 65 to 70%. The left ventricle has normal function. The left ventricle has no regional wall motion abnormalities. There is mild asymmetric left ventricular hypertrophy of the basal segment. Left ventricular diastolic parameters are consistent with Grade I diastolic dysfunction (impaired relaxation).  2. Right ventricular systolic function is normal. The right ventricular size is normal. Tricuspid regurgitation signal is inadequate for assessing PA pressure.  3. The mitral valve is degenerative. Trivial mitral valve regurgitation.  4. The aortic valve is abnormal. There is moderate calcification of the aortic valve. Aortic valve regurgitation is trivial. Moderate aortic valve stenosis. Aortic valve mean gradient measures 21.0 mmHg. Dimentionless index 0.46.  5. The inferior vena cava is normal in size with greater than 50% respiratory variability, suggesting right atrial pressure of 3 mmHg. Comparison(s): No prior Echocardiogram. FINDINGS  Left Ventricle: Left ventricular ejection fraction, by estimation, is 65 to 70%. The left ventricle has normal function. The left ventricle has no regional wall motion abnormalities. The left ventricular internal cavity size was normal in size. There is  mild asymmetric left ventricular hypertrophy of the basal segment. Left ventricular diastolic parameters are consistent with Grade I diastolic dysfunction (impaired relaxation). Right Ventricle: The right ventricular size is normal. No increase in right ventricular wall thickness. Right ventricular systolic function is normal. Tricuspid regurgitation signal is inadequate for assessing PA pressure. Left Atrium: Left atrial size was normal  in size. Right Atrium: Right atrial size was normal in size. Pericardium: There is no evidence of pericardial effusion. Presence of epicardial fat layer. Mitral Valve: The mitral valve is degenerative in appearance. Mild mitral annular calcification. Trivial mitral valve regurgitation. Tricuspid Valve: The tricuspid valve is grossly normal. Tricuspid valve regurgitation is trivial. Aortic Valve: The aortic valve is abnormal. There is moderate calcification of the aortic valve. There is mild aortic valve annular calcification. Aortic valve regurgitation is trivial. Aortic regurgitation PHT measures 562 msec. Moderate aortic stenosis  is present.  Aortic valve mean gradient measures 21.0 mmHg. Aortic valve peak gradient measures 39.0 mmHg. Aortic valve area, by VTI measures 1.17 cm. Pulmonic Valve: The pulmonic valve was grossly normal. Pulmonic valve regurgitation is trivial. Aorta: The aortic root is normal in size and structure. Venous: The inferior vena cava is normal in size with greater than 50% respiratory variability, suggesting right atrial pressure of 3 mmHg. IAS/Shunts: No atrial level shunt detected by color flow Doppler. Additional Comments: 3D was performed not requiring image post processing on an independent workstation and was indeterminate.  LEFT VENTRICLE PLAX 2D LVIDd:         4.40 cm   Diastology LVIDs:         3.00 cm   LV e' medial:    6.84 cm/s LV PW:         1.00 cm   LV E/e' medial:  13.8 LV IVS:        1.10 cm   LV e' lateral:   7.71 cm/s LVOT diam:     1.80 cm   LV E/e' lateral: 12.2 LV SV:         82 LV SV Index:   44 LVOT Area:     2.54 cm  RIGHT VENTRICLE RV S prime:     15.30 cm/s TAPSE (M-mode): 2.1 cm LEFT ATRIUM             Index        RIGHT ATRIUM           Index LA diam:        3.50 cm 1.88 cm/m   RA Area:     14.00 cm LA Vol (A2C):   51.9 ml 27.81 ml/m  RA Volume:   31.40 ml  16.82 ml/m LA Vol (A4C):   39.6 ml 21.22 ml/m LA Biplane Vol: 45.5 ml 24.38 ml/m  AORTIC VALVE AV  Area (Vmax):    0.87 cm AV Area (Vmean):   0.90 cm AV Area (VTI):     1.17 cm AV Vmax:           312.33 cm/s AV Vmean:          211.000 cm/s AV VTI:            0.701 m AV Peak Grad:      39.0 mmHg AV Mean Grad:      21.0 mmHg LVOT Vmax:         107.00 cm/s LVOT Vmean:        74.800 cm/s LVOT VTI:          0.323 m LVOT/AV VTI ratio: 0.46 AI PHT:            562 msec  AORTA Ao Root diam: 3.50 cm MITRAL VALVE MV Area (PHT): 2.80 cm     SHUNTS MV Decel Time: 271 msec     Systemic VTI:  0.32 m MV E velocity: 94.30 cm/s   Systemic Diam: 1.80 cm MV A velocity: 121.00 cm/s MV E/A ratio:  0.78 Jayson Sierras MD Electronically signed by Jayson Sierras MD Signature Date/Time: 12/16/2023/2:51:34 PM    Final    MR BRAIN WO CONTRAST Result Date: 12/16/2023 EXAM: MRI BRAIN WITHOUT CONTRAST 12/16/2023 11:40 AM TECHNIQUE: Multiplanar multisequence MRI of the head/brain was performed without the administration of intravenous contrast. COMPARISON: CT head 12/15/2023. CLINICAL HISTORY: Neuro deficit, acute, stroke suspected. Acute onset of right sided weakness and slurred speech. FINDINGS: BRAIN AND VENTRICLES: No acute infarct, mass, midline shift,  hydrocephalus, or extraaxial fluid collection is identified. Chronic microhemorrhages are noted in the right thalamus and posterior right temporal lobe. T2 hyperintensities in the cerebral white matter bilaterally are nonspecific but compatible with mild chronic small vessel ischemic disease. There is mild cerebral atrophy. Major intracranial vascular flow voids are preserved. ORBITS: No acute abnormality. SINUSES AND MASTOIDS: No acute abnormality. BONES AND SOFT TISSUES: Normal marrow signal. No acute soft tissue abnormality. Bilateral cataract extraction. IMPRESSION: 1. No acute intracranial abnormality. 2. Mild chronic small vessel ischemic disease. Electronically signed by: Dasie Hamburg MD 12/16/2023 12:11 PM EST RP Workstation: [id number]   CT ANGIO HEAD NECK W WO CM Result  Date: 12/16/2023 CLINICAL DATA:  Initial evaluation for acute neuro deficit, stroke. EXAM: CT ANGIOGRAPHY HEAD AND NECK WITH AND WITHOUT CONTRAST TECHNIQUE: Multidetector CT imaging of the head and neck was performed using the standard protocol during bolus administration of intravenous contrast. Multiplanar CT image reconstructions and MIPs were obtained to evaluate the vascular anatomy. Carotid stenosis measurements (when applicable) are obtained utilizing NASCET criteria, using the distal internal carotid diameter as the denominator. RADIATION DOSE REDUCTION: This exam was performed according to the departmental dose-optimization program which includes automated exposure control, adjustment of the mA and/or kV according to patient size and/or use of iterative reconstruction technique. CONTRAST:  75mL OMNIPAQUE  IOHEXOL  350 MG/ML SOLN COMPARISON:  CT from 12/15/2023. FINDINGS: CTA NECK FINDINGS Aortic arch: Visualized aortic arch within normal limits for caliber with standard 3 vessel morphology. Aortic atherosclerosis. No significant stenosis about the origin the great vessels. Right carotid system: Right common and internal carotid arteries are patent without dissection. Mild for age atheromatous change about the right carotid bulb without stenosis. Left carotid system: Left common and internal carotid arteries are patent without dissection. Mild for age atheromatous change about the left carotid bulb without stenosis. Vertebral arteries: Both vertebral arteries arise from subclavian arteries. No significant proximal subclavian artery stenosis vertebral arteries are patent without stenosis or dissection. Skeleton: No worrisome osseous lesions. Moderate to advanced multilevel cervical spondylosis, most pronounced at C6-7. Other neck: No other acute finding. Upper chest: No other acute finding. Review of the MIP images confirms the above findings CTA HEAD FINDINGS Anterior circulation: Atheromatous change about the  carotid siphons without hemodynamically significant stenosis. A1 segments patent bilaterally. Normal anterior communicating artery complex. Atheromatous irregularity about the ACAs without proximal high-grade stenosis. Atheromatous irregularity about the M1 segments bilaterally with associated short-segment mild stenosis within the mid left M1 segment (series 12, image 21). No proximal MCA branch occlusion or high-grade stenosis. Distal MCA branches perfused and symmetric. Diffuse small vessel atheromatous irregularity noted. Posterior circulation: Vertebrobasilar dolichoectasia noted. Atheromatous irregularity about the ectatic V4 segments without high-grade stenosis. Neither PICA origin well visualized. Basilar is irregular and ectatic but patent without stenosis. Superior cerebral arteries patent bilaterally. Both PCAs primarily supplied via the basilar. Atheromatous irregularity about the PCAs without proximal high-grade stenosis. Venous sinuses: Patent allowing for timing the contrast bolus. Anatomic variants: None significant.  No aneurysm. Review of the MIP images confirms the above findings IMPRESSION: 1. Negative CTA for large vessel occlusion or other emergent finding. 2. Tortuosity of the major arterial vasculature of the head and neck with intracranial arterial dolichoectasia, suggesting chronic underlying hypertension. 3. Scattered atheromatous disease about the carotid bifurcations, carotid siphons, and intracranial circulation, but no hemodynamically significant or correctable stenosis. Aortic Atherosclerosis (ICD10-I70.0). Electronically Signed   By: Morene Hoard M.D.   On: 12/16/2023 02:05   CT HEAD WO CONTRAST  Result Date: 12/15/2023 EXAM: CT HEAD WITHOUT 12/15/2023 10:48:44 PM TECHNIQUE: CT of the head was performed without the administration of intravenous contrast. Automated exposure control, iterative reconstruction, and/or weight based adjustment of the mA/kV was utilized to reduce  the radiation dose to as low as reasonably achievable. COMPARISON: 12 / 19 / 19 CLINICAL HISTORY: Neuro deficit, acute, stroke suspected FINDINGS: BRAIN AND VENTRICLES: No acute intracranial hemorrhage. No mass effect or midline shift. No extra-axial fluid collection. No evidence of acute infarct. Scattered and confluent periventricular and subcortical white matter hypodense lesions, likely sequela of chronic microvascular ischemic change. Prominence of ventricles and sulci reflecting age-related volume loss. Calcified atherosclerotic plaque in cavernous/supraclinoid Internal Carotid Artery (ICA) and intradural vertebral arteries. ORBITS: Bilateral lens replacement noted. SINUSES AND MASTOIDS: No acute abnormality. SOFT TISSUES AND SKULL: No acute skull fracture. No acute soft tissue abnormality. IMPRESSION: 1. No acute intracranial abnormality. Electronically signed by: Norman Gatlin MD 12/15/2023 10:58 PM EST RP Workstation: [id number]    Microbiology: Results for orders placed or performed in visit on 10/23/23  Microscopic Examination     Status: Abnormal   Collection Time: 10/23/23 10:24 AM   Urine  Result Value Ref Range Status   WBC, UA 0-5 0 - 5 /hpf Final   RBC, Urine 3-10 (A) 0 - 2 /hpf Final   Epithelial Cells (non renal) 0-10 0 - 10 /hpf Final   Bacteria, UA None seen None seen/Few Final    Labs: CBC: Recent Labs  Lab 12/15/23 2240 12/15/23 2242 12/16/23 0430  WBC 10.2  --  7.3  NEUTROABS 8.1*  --   --   HGB 12.0* 11.6* 10.2*  HCT 36.4* 34.0* 31.2*  MCV 91.0  --  91.2  PLT 172  --  145*   Basic Metabolic Panel: Recent Labs  Lab 12/15/23 2240 12/15/23 2242 12/16/23 0430  NA 141 143 140  K 4.1 4.0 3.4*  CL 109 108 108  CO2 20*  --  24  GLUCOSE 110* 111* 149*  BUN 14 14 13   CREATININE 1.01 1.10 1.01  CALCIUM 9.8  --  9.2  MG  --   --  2.3  PHOS  --   --  3.0   Liver Function Tests: Recent Labs  Lab 12/15/23 2240 12/16/23 0430  AST 20 18  ALT 16 15   ALKPHOS 59 50  BILITOT 0.3 0.3  PROT 6.6 5.5*  ALBUMIN 4.3 3.6   CBG: Recent Labs  Lab 12/15/23 2229  GLUCAP 112*    Discharge time spent: greater than 30 minutes.  Signed: Alm Schneider, MD [facility] 12/17/2023

## 2023-12-17 NOTE — Care Management Obs Status (Signed)
 MEDICARE OBSERVATION STATUS NOTIFICATION   Patient Details  Name: David Mooney MRN: [id number] Date of Birth: 03-24-1940   Medicare Observation Status Notification Given:  Yes    Duwaine LITTIE Ada 12/17/2023, 9:02 AM

## 2023-12-17 NOTE — Plan of Care (Signed)

## 2024-01-06 ENCOUNTER — Encounter: Payer: Self-pay | Admitting: Physician Assistant

## 2024-01-08 ENCOUNTER — Other Ambulatory Visit: Payer: Self-pay

## 2024-01-08 NOTE — Progress Notes (Signed)
 Specialty Pharmacy Refill Coordination Note  David Mooney is a 83 y.o. male contacted today regarding refills of specialty medication(s) Guselkumab  (Tremfya  Pen)   Patient requested Delivery   Delivery date: 01/14/24   Verified address: [address]   [place], [place] [address]   Medication will be filled on: 01/14/24

## 2024-01-10 ENCOUNTER — Emergency Department (HOSPITAL_COMMUNITY)

## 2024-01-10 ENCOUNTER — Other Ambulatory Visit: Payer: Self-pay

## 2024-01-10 ENCOUNTER — Encounter (HOSPITAL_COMMUNITY): Payer: Self-pay

## 2024-01-10 ENCOUNTER — Inpatient Hospital Stay (HOSPITAL_COMMUNITY)
Admission: EM | Admit: 2024-01-10 | Discharge: 2024-01-15 | DRG: 177 | Disposition: A | Discharge status: Skilled Nursing Facility | Attending: Internal Medicine | Admitting: Internal Medicine

## 2024-01-10 DIAGNOSIS — D649 Anemia, unspecified: Secondary | ICD-10-CM | POA: Diagnosis present

## 2024-01-10 DIAGNOSIS — E782 Mixed hyperlipidemia: Secondary | ICD-10-CM | POA: Diagnosis not present

## 2024-01-10 DIAGNOSIS — R3915 Urgency of urination: Secondary | ICD-10-CM

## 2024-01-10 DIAGNOSIS — Z882 Allergy status to sulfonamides status: Secondary | ICD-10-CM

## 2024-01-10 DIAGNOSIS — R296 Repeated falls: Secondary | ICD-10-CM | POA: Diagnosis present

## 2024-01-10 DIAGNOSIS — N400 Enlarged prostate without lower urinary tract symptoms: Secondary | ICD-10-CM | POA: Diagnosis present

## 2024-01-10 DIAGNOSIS — H409 Unspecified glaucoma: Secondary | ICD-10-CM | POA: Diagnosis present

## 2024-01-10 DIAGNOSIS — G459 Transient cerebral ischemic attack, unspecified: Principal | ICD-10-CM | POA: Diagnosis present

## 2024-01-10 DIAGNOSIS — K5080 Crohn's disease of both small and large intestine without complications: Secondary | ICD-10-CM | POA: Diagnosis present

## 2024-01-10 DIAGNOSIS — Z8249 Family history of ischemic heart disease and other diseases of the circulatory system: Secondary | ICD-10-CM

## 2024-01-10 DIAGNOSIS — R351 Nocturia: Secondary | ICD-10-CM

## 2024-01-10 DIAGNOSIS — Z7902 Long term (current) use of antithrombotics/antiplatelets: Secondary | ICD-10-CM

## 2024-01-10 DIAGNOSIS — E876 Hypokalemia: Secondary | ICD-10-CM | POA: Diagnosis present

## 2024-01-10 DIAGNOSIS — R4182 Altered mental status, unspecified: Principal | ICD-10-CM

## 2024-01-10 DIAGNOSIS — R32 Unspecified urinary incontinence: Secondary | ICD-10-CM | POA: Diagnosis present

## 2024-01-10 DIAGNOSIS — K219 Gastro-esophageal reflux disease without esophagitis: Secondary | ICD-10-CM | POA: Diagnosis not present

## 2024-01-10 DIAGNOSIS — Z86718 Personal history of other venous thrombosis and embolism: Secondary | ICD-10-CM

## 2024-01-10 DIAGNOSIS — Z8673 Personal history of transient ischemic attack (TIA), and cerebral infarction without residual deficits: Secondary | ICD-10-CM

## 2024-01-10 DIAGNOSIS — Z7982 Long term (current) use of aspirin: Secondary | ICD-10-CM

## 2024-01-10 DIAGNOSIS — K5000 Crohn's disease of small intestine without complications: Secondary | ICD-10-CM | POA: Diagnosis present

## 2024-01-10 DIAGNOSIS — N138 Other obstructive and reflux uropathy: Secondary | ICD-10-CM

## 2024-01-10 DIAGNOSIS — Z1152 Encounter for screening for COVID-19: Secondary | ICD-10-CM

## 2024-01-10 DIAGNOSIS — N401 Enlarged prostate with lower urinary tract symptoms: Secondary | ICD-10-CM

## 2024-01-10 DIAGNOSIS — Z79899 Other long term (current) drug therapy: Secondary | ICD-10-CM

## 2024-01-10 DIAGNOSIS — K269 Duodenal ulcer, unspecified as acute or chronic, without hemorrhage or perforation: Secondary | ICD-10-CM

## 2024-01-10 DIAGNOSIS — G3184 Mild cognitive impairment, so stated: Secondary | ICD-10-CM | POA: Diagnosis present

## 2024-01-10 DIAGNOSIS — Z833 Family history of diabetes mellitus: Secondary | ICD-10-CM

## 2024-01-10 DIAGNOSIS — G9341 Metabolic encephalopathy: Secondary | ICD-10-CM | POA: Diagnosis present

## 2024-01-10 DIAGNOSIS — R911 Solitary pulmonary nodule: Secondary | ICD-10-CM | POA: Diagnosis present

## 2024-01-10 DIAGNOSIS — J690 Pneumonitis due to inhalation of food and vomit: Principal | ICD-10-CM | POA: Diagnosis present

## 2024-01-10 LAB — URINALYSIS, ROUTINE W REFLEX MICROSCOPIC
Bacteria, UA: NONE SEEN
Bilirubin Urine: NEGATIVE
Glucose, UA: NEGATIVE mg/dL
Ketones, ur: NEGATIVE mg/dL
Leukocytes,Ua: NEGATIVE
Nitrite: NEGATIVE
Protein, ur: 30 mg/dL — AB
Specific Gravity, Urine: 1.015 (ref 1.005–1.030)
pH: 6 (ref 5.0–8.0)

## 2024-01-10 LAB — CBC
HCT: 35.7 % — ABNORMAL LOW (ref 39.0–52.0)
Hemoglobin: 12 g/dL — ABNORMAL LOW (ref 13.0–17.0)
MCH: 29.9 pg (ref 26.0–34.0)
MCHC: 33.6 g/dL (ref 30.0–36.0)
MCV: 89 fL (ref 80.0–100.0)
Platelets: 150 K/uL (ref 150–400)
RBC: 4.01 MIL/uL — ABNORMAL LOW (ref 4.22–5.81)
RDW: 13.2 % (ref 11.5–15.5)
WBC: 10.9 K/uL — ABNORMAL HIGH (ref 4.0–10.5)
nRBC: 0 % (ref 0.0–0.2)

## 2024-01-10 LAB — RESP PANEL BY RT-PCR (RSV, FLU A&B, COVID)  RVPGX2
Influenza A by PCR: NEGATIVE
Influenza B by PCR: NEGATIVE
Resp Syncytial Virus by PCR: NEGATIVE
SARS Coronavirus 2 by RT PCR: NEGATIVE

## 2024-01-10 LAB — COMPREHENSIVE METABOLIC PANEL WITH GFR
ALT: 19 U/L (ref 0–44)
AST: 30 U/L (ref 15–41)
Albumin: 4.3 g/dL (ref 3.5–5.0)
Alkaline Phosphatase: 49 U/L (ref 38–126)
Anion gap: 14 (ref 5–15)
BUN: 12 mg/dL (ref 8–23)
CO2: 23 mmol/L (ref 22–32)
Calcium: 9.9 mg/dL (ref 8.9–10.3)
Chloride: 104 mmol/L (ref 98–111)
Creatinine, Ser: 0.93 mg/dL (ref 0.61–1.24)
GFR, Estimated: >60 mL/min
Glucose, Bld: 113 mg/dL — ABNORMAL HIGH (ref 70–99)
Potassium: 4 mmol/L (ref 3.5–5.1)
Sodium: 140 mmol/L (ref 135–145)
Total Bilirubin: 0.6 mg/dL (ref 0.0–1.2)
Total Protein: 7 g/dL (ref 6.5–8.1)

## 2024-01-10 LAB — CBG MONITORING, ED: Glucose-Capillary: 113 mg/dL — ABNORMAL HIGH (ref 70–99)

## 2024-01-10 LAB — ACETAMINOPHEN LEVEL: Acetaminophen (Tylenol), Serum: <10 ug/mL — ABNORMAL LOW (ref 10–30)

## 2024-01-10 LAB — SALICYLATE LEVEL: Salicylate Lvl: <7 mg/dL — ABNORMAL LOW (ref 7.0–30.0)

## 2024-01-10 MED ORDER — ONDANSETRON HCL 4 MG PO TABS: 4.0000 mg | ORAL_TABLET | Freq: Four times a day (QID) | ORAL | Status: DC | PRN

## 2024-01-10 MED ORDER — ACETAMINOPHEN 325 MG PO TABS
650.0000 mg | ORAL_TABLET | Freq: Four times a day (QID) | ORAL | Status: DC | PRN
  Administered 2024-01-11 – 2024-01-14 (×6): 650 mg via ORAL
  Filled 2024-01-10 (×3): qty 2

## 2024-01-10 MED ORDER — ENOXAPARIN SODIUM 40 MG/0.4ML IJ SOSY
40.0000 mg | PREFILLED_SYRINGE | INTRAMUSCULAR | Status: DC
  Administered 2024-01-11 – 2024-01-15 (×5): 40 mg via SUBCUTANEOUS
  Filled 2024-01-10 (×3): qty 0.4

## 2024-01-10 MED ORDER — ACETAMINOPHEN 650 MG RE SUPP: 650.0000 mg | Freq: Four times a day (QID) | RECTAL | Status: DC | PRN

## 2024-01-10 MED ORDER — ONDANSETRON HCL 4 MG/2ML IJ SOLN: 4.0000 mg | Freq: Four times a day (QID) | INTRAMUSCULAR | Status: DC | PRN

## 2024-01-10 MED ORDER — SODIUM CHLORIDE 0.9 % IV BOLUS
1000.0000 mL | Freq: Once | INTRAVENOUS | Status: AC
  Administered 2024-01-10: 1000 mL via INTRAVENOUS

## 2024-01-10 NOTE — ED Notes (Signed)
 Ambulated to and from restroom with no assistance.   Steady gait. Denies dizziness.   Family says this is much better and appears at baseline.

## 2024-01-10 NOTE — ED Provider Notes (Signed)
 " [facility] EMERGENCY DEPARTMENT AT [facility] Provider Note   CSN: [id number] Arrival date & time: 01/10/24  1407     Patient presents with: Weakness   David Mooney is a 83 y.o. male with history of BPH, ulcerative colitis, remote history of DVT, recent TIA on 12/01 presents with complaints of altered mental status.  Accompanied by family who reports that he has had difficulty balancing, has been confused and lethargic.  Last known well was 9 PM last night.  Has had multiple falls when getting up to use the restroom at night.  Has required assistance getting up.  Additionally has had some urinary incontinence.  No URI symptoms, vomiting or diarrhea noted by family.  Has not been complaining of any pain.    Weakness  Past Medical History:  Diagnosis Date   Arthritis    Cataract    Complication of anesthesia    Embolus (HCC) 12/2022   left Hip   GERD (gastroesophageal reflux disease)    Glaucoma    Hernia of unspecified site of abdominal cavity without mention of obstruction or gangrene    History of gastric ulcer    History of kidney stones    PONV (postoperative nausea and vomiting)    Ulcerative colitis    Past Surgical History:  Procedure Laterality Date   BIOPSY  12/04/2018   Procedure: BIOPSY;  Surgeon: Golda Claudis PENNER, MD;  [facility];  Service: Endoscopy;;  cecum   COLONOSCOPY     COLONOSCOPY N/A 08/27/2012   Procedure: COLONOSCOPY;  Surgeon: Claudis PENNER Golda, MD;  [facility]: [facility];  Service: Endoscopy;  Laterality: N/A;  1200-moved to 13:15 Ann to notify pt   COLONOSCOPY N/A 12/04/2018   Procedure: COLONOSCOPY;  Surgeon: Golda Claudis PENNER, MD;  [facility];  Service: Endoscopy;  Laterality: N/A;  155pm   COLONOSCOPY N/A 10/01/2023   Procedure: COLONOSCOPY;  Surgeon: Eartha Angelia Sieving, MD;  [facility];  Service: Gastroenterology;  Laterality: N/A;  8:15 am, asa 3   CYSTOSCOPY WITH INSERTION OF UROLIFT  N/A 04/30/2022   Procedure: CYSTOSCOPY WITH INSERTION OF UROLIFT;  Surgeon: Sherrilee Belvie CROME, MD;  Location: [facility];  Service: Urology;  Laterality: N/A;   ESOPHAGOGASTRODUODENOSCOPY N/A 07/09/2023   Procedure: EGD (ESOPHAGOGASTRODUODENOSCOPY);  Surgeon: Eartha Angelia, Sieving, MD;  [facility];  Service: Gastroenterology;  Laterality: N/A;  7:30AM;ASA 3   ESOPHAGOGASTRODUODENOSCOPY (EGD) WITH PROPOFOL   01/04/2023   Procedure: ESOPHAGOGASTRODUODENOSCOPY (EGD) WITH PROPOFOL ;  Surgeon: Cindie Carlin POUR, DO;  [facility];  Service: Endoscopy;;   ESOPHAGOGASTRODUODENOSCOPY (EGD) WITH PROPOFOL  N/A 04/09/2023   Procedure: ESOPHAGOGASTRODUODENOSCOPY (EGD) WITH PROPOFOL ;  Surgeon: Eartha Angelia Sieving, MD;  [facility];  Service: Gastroenterology;  Laterality: N/A;  7:30am;asa 3   HERNIA REPAIR Left    MOLE REMOVAL     From head , chest   UPPER GASTROINTESTINAL ENDOSCOPY         Prior to Admission medications  Medication Sig Start Date End Date Taking? Authorizing Provider  acetaminophen  (TYLENOL ) 650 MG CR tablet Take 1,300 mg by mouth 2 (two) times daily.    [provider]  aspirin  81 MG chewable tablet Chew 1 tablet (81 mg total) by mouth daily. 12/17/23   Evonnie Lenis, MD  atorvastatin  (LIPITOR) 20 MG tablet Take 20 mg by mouth daily. 01/16/22   [provider]  clopidogrel  (PLAVIX ) 75 MG tablet Take 1 tablet (75 mg total) by mouth  daily. 12/18/23   Evonnie Lenis, MD  esomeprazole  (NEXIUM ) 40 MG capsule Take 1 capsule (40 mg total) by mouth 2 (two) times daily before a meal. Do NOT take until done with 20 days of plavix  12/17/23   Tat, Lenis, MD  finasteride  (PROSCAR ) 5 MG tablet Take 1 tablet (5 mg total) by mouth daily. TAKE 1 TABLET(5 MG) BY MOUTH DAILY 10/23/23   McKenzie, Belvie CROME, MD  Guaifenesin [facility] MAXIMUM STRENGTH) 1200 MG TB12 Take 1 tablet by mouth 2 (two) times daily as needed (chest congestion).    [provider]   mirabegron  ER (MYRBETRIQ ) 50 MG TB24 tablet Take 1 tablet (50 mg total) by mouth daily. 10/23/23   McKenzie, Belvie CROME, MD  Multiple Vitamin (MULTIVITAMIN) tablet Take 1 tablet by mouth daily.    [provider]  sucralfate  (CARAFATE ) 1 GM/10ML suspension Take 10 mLs (1 g total) by mouth 4 (four) times daily -  before meals and at bedtime. 11/18/23   Shirlean Therisa ORN, NP  tamsulosin  (FLOMAX ) 0.4 MG CAPS capsule Take 1 capsule (0.4 mg total) by mouth daily. 10/23/23   McKenzie, Belvie CROME, MD  TREMFYA  PEN 200 MG/2ML SOAJ Inject 200 mg into the skin every 28 (twenty-eight) days. Patient not taking: Reported on 12/16/2023 12/10/23   Eartha Angelia Sieving, MD    Allergies: Patient has no known allergies.    Review of Systems  Neurological:  Positive for weakness.    Updated Vital Signs BP (!) 150/74   Pulse 75   Temp 99.2 F (37.3 C) (Oral)   Resp 14   Ht 5' 6 (1.676 m)   Wt 77.1 kg   SpO2 97%   BMI 27.43 kg/m   Physical Exam Vitals and nursing note reviewed.  Constitutional:      General: He is not in acute distress.    Appearance: He is well-developed.  HENT:     Head: Normocephalic and atraumatic.  Eyes:     Conjunctiva/sclera: Conjunctivae normal.  Cardiovascular:     Rate and Rhythm: Normal rate and regular rhythm.     Heart sounds: No murmur heard. Pulmonary:     Effort: Pulmonary effort is normal. No respiratory distress.     Breath sounds: Normal breath sounds.  Abdominal:     Palpations: Abdomen is soft.     Tenderness: There is no abdominal tenderness.  Musculoskeletal:        General: No swelling.     Cervical back: Neck supple.  Skin:    General: Skin is warm and dry.     Capillary Refill: Capillary refill takes less than 2 seconds.  Neurological:     Mental Status: He is alert.     Comments: Patient is alert and oriented. There is no abnormal phonation. Symmetric smile without facial droop. No pronator drift. Moves all extremities spontaneously.  5/5 strength in upper and lower extremities. . No sensation deficit. There is no nystagmus. EOMI, PERRL. Coordination intact with finger to nose and normal ambulation.    Psychiatric:        Mood and Affect: Mood normal.     (all labs ordered are listed, but only abnormal results are displayed) Labs Reviewed  COMPREHENSIVE METABOLIC PANEL WITH GFR - Abnormal; Notable for the following components:      Result Value   Glucose, Bld 113 (*)    All other components within normal limits  CBC - Abnormal; Notable for the following components:   WBC 10.9 (*)  RBC 4.01 (*)    Hemoglobin 12.0 (*)    HCT 35.7 (*)    All other components within normal limits  URINALYSIS, ROUTINE W REFLEX MICROSCOPIC - Abnormal; Notable for the following components:   Hgb urine dipstick MODERATE (*)    Protein, ur 30 (*)    Crystals PRESENT (*)    All other components within normal limits  SALICYLATE LEVEL - Abnormal; Notable for the following components:   Salicylate Lvl <7.0 (*)    All other components within normal limits  ACETAMINOPHEN  LEVEL - Abnormal; Notable for the following components:   Acetaminophen  (Tylenol ), Serum <10 (*)    All other components within normal limits  CBG MONITORING, ED - Abnormal; Notable for the following components:   Glucose-Capillary 113 (*)    All other components within normal limits  RESP PANEL BY RT-PCR (RSV, FLU A&B, COVID)  [id number]    EKG: EKG Interpretation Date/Time:  Friday January 10 2024 14:21:23 EST Ventricular Rate:  83 PR Interval:  158 QRS Duration:  135 QT Interval:  365 QTC Calculation: 429 R Axis:   81  Text Interpretation: Sinus rhythm Nonspecific intraventricular conduction delay Baseline wander in lead(s) I III aVR aVL Confirmed by Patsey Lot [phone]) on 01/10/2024 8:45:23 PM  Radiology: [facility] Chest 2 View Result Date: 01/10/2024 EXAM: 2 VIEW(S) XRAY OF THE CHEST 01/10/2024 06:27:00 PM COMPARISON: 08/13/2023 CLINICAL HISTORY: AMS  FINDINGS: LUNGS AND PLEURA: Linear opacity at left base consistent with scarring or atelectasis. No pleural effusion. No pneumothorax. HEART AND MEDIASTINUM: Calcified aorta. BONES AND SOFT TISSUES: No acute osseous abnormality. IMPRESSION: 1. No acute cardiopulmonary abnormality. Electronically signed by: Morgane Naveau MD 01/10/2024 08:30 PM EST RP Workstation: [id number]   MR BRAIN WO CONTRAST Result Date: 01/10/2024 EXAM: MRI BRAIN WITHOUT CONTRAST 01/10/2024 05:40:24 PM TECHNIQUE: Multiplanar multisequence MRI of the head/brain was performed without the administration of intravenous contrast. COMPARISON: CT head earlier today. CLINICAL HISTORY: Recent TIA, AMS, ataxic FINDINGS: BRAIN AND VENTRICLES: No acute infarct. No intracranial hemorrhage. No mass. No midline shift. No hydrocephalus. The sella is unremarkable. Normal flow voids. ORBITS: No acute abnormality. SINUSES AND MASTOIDS: No acute abnormality. BONES AND SOFT TISSUES: Normal marrow signal. No acute soft tissue abnormality. IMPRESSION: 1. No acute intracranial abnormality. Electronically signed by: Gilmore Molt 01/10/2024 07:05 PM EST RP Workstation: [id number]   CT Cervical Spine Wo Contrast Result Date: 01/10/2024 EXAM: CT CERVICAL SPINE WITHOUT CONTRAST 01/10/2024 03:34:09 PM TECHNIQUE: CT of the cervical spine was performed without the administration of intravenous contrast. Multiplanar reformatted images are provided for review. Automated exposure control, iterative reconstruction, and/or weight based adjustment of the mA/kV was utilized to reduce the radiation dose to as low as reasonably achievable. COMPARISON: 01/02/2018 CLINICAL HISTORY: Neck trauma (Age >= 65y) FINDINGS: BONES AND ALIGNMENT: Straightening of the normal cervical lordosis. Trace degenerative retrolisthesis of C3 on C4. Additional trace degenerative anterolisthesis of C7 on T1. No evidence of traumatic malalignment. No acute fracture. DEGENERATIVE CHANGES: Disc  space narrowing and degenerative endplate osteophytes at multiple levels. Degenerative endplate changes greatest at C6-C7. Facet arthrosis and uncovertebral hypertrophy at multiple levels. No high grade osseous spinal canal stenosis. SOFT TISSUES: No prevertebral soft tissue swelling. IMPRESSION: 1. No evidence of acute traumatic injury. Electronically signed by: Donnice Mania MD 01/10/2024 03:48 PM EST RP Workstation: [id number]   CT Head Wo Contrast Result Date: 01/10/2024 EXAM: CT HEAD WITHOUT CONTRAST 01/10/2024 03:34:09 PM TECHNIQUE: CT of the head was performed without the administration of intravenous contrast. Automated  exposure control, iterative reconstruction, and/or weight based adjustment of the mA/kV was utilized to reduce the radiation dose to as low as reasonably achievable. COMPARISON: 12/15/2023 CLINICAL HISTORY: Head trauma, minor (Age >= 65y); Mental status change, unknown cause. FINDINGS: BRAIN AND VENTRICLES: No acute hemorrhage. No evidence of acute infarct. No hydrocephalus. No extra-axial collection. No mass effect or midline shift. 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 Age-related cerebral volume loss. Atherosclerosis of the carotid siphons and intracranial vertebral arteries. There is tortuosity of the basilar artery without evidence of dilatation. ORBITS: Bilateral lens replacement. SINUSES: No acute abnormality. SOFT TISSUES AND SKULL: No acute soft tissue abnormality. No skull fracture. IMPRESSION: 1. No acute intracranial abnormality. Electronically signed by: Donnice Mania MD 01/10/2024 03:43 PM EST RP Workstation: [id number]     Procedures   Medications Ordered in the ED  sodium chloride  0.9 % bolus 1,000 mL (0 mLs Intravenous Stopped 01/10/24 2209)    Clinical Course as of 01/10/24 2240  Fri Jan 10, 2024  1541 Patient with history of recent TIA evaluated for altered mental status with confusion, lethargy and difficulty  balancing.  Last known well was 9 PM last night.  Has additionally had multiple falls recently.  Upon arrival patient is hemodynamically stable and nontoxic-appearing.  He is alert and oriented, follows commands. [JT]  1552 CT Head Wo Contrast No acute intracranial abnormality [JT]  1552 CT Cervical Spine Wo Contrast No evidence of acute injury [JT]  1553 Resp panel by RT-PCR (RSV, Flu A&B, Covid) Anterior Nasal Swab Negative [JT]  1553 CBC(!) Mild leukocytosis, hemoglobin stable [JT]  1553 Comprehensive metabolic panel(!) No significant abnormality [JT]  1749 Urinalysis, Routine w reflex microscopic -Urine, Clean Catch(!) Negative nitrates, negative leukocytes, 0-5 WBCs with no bacteria [JT]  1908 MR BRAIN WO CONTRAST No acute intracranial abnormality [JT]  2104 Successful ambulation test [JT]  2239 Overall story is most 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  Patient is back at baseline and is ambulating with normal mentation.  Family does not feel comfortable with patient going home despite recent admission and workup for similar presentation.  Discussed patient with Dr. Adefeso, agreed for admission [JT]    Clinical Course User Index [JT] Donnajean Lynwood DEL, PA-C                                 Medical Decision Making Amount and/or Complexity of Data Reviewed Labs: ordered. Decision-making details documented in ED Course. Radiology: ordered. Decision-making details documented in ED Course.  Risk Decision regarding hospitalization.   This patient presents to the ED with chief complaint(s) of AMS .  The complaint involves an extensive differential diagnosis and also carries with it a high risk of complications and morbidity.   Pertinent past medical history as listed in HPI  The differential diagnosis includes  Pneumonia, UTI, intoxication, CVA, TIA, subarachnoid hemorrhage Additional history obtained: Additional history obtained from family Records reviewed Care Everywhere/External  Records  Disposition:   Patient admitted for observation  Social Determinants of Health:   none  This note was dictated with voice recognition software.  Despite best efforts at proofreading, errors may have occurred which can change the documentation meaning.       Final diagnoses:  Altered mental status, unspecified altered mental status type    ED Discharge Orders     None          Donnajean Lynwood DEL, PA-C 01/10/24  2240    Bernard Drivers, MD 01/11/24 1410  "

## 2024-01-10 NOTE — H&P (Addendum)
 " History and Physical    Patient: David Mooney FMW:[id number] DOB: [birth year] DOA: 01/10/2024 DOS: the patient was seen and examined on 01/10/2024 PCP: Lari Elspeth BRAVO, MD  Patient coming from: Home  Chief Complaint:  Chief Complaint  Patient presents with   Weakness   HPI: KEIGO WHALLEY is an 83 y.o. male with medical history significant of BPH, duodenal ulcer, ulcerative colitis, DVT, recent TIA (12/1) who presents to the emergency department due to altered mental status.  At bedside, patient was unable to provide details regarding why he came to the ED, history was obtained from ED PA and ED medical record.  Per report, patient last known well was 9 PM last night, he has had multiple falls when he gets up to use the restroom at night due to balancing issues whereby he tends to lean to the right.  Patient also has needed assistance in getting up from sitting position, has had some urinary incontinence (changed from baseline).  No report of vomiting, diarrhea, fever or chills.  ED course In the emergency department, patient was hemodynamically stable.  BP 144/78.  Workup in the ED showed WBC of 10.9, normocytic anemia.  BMP was normal, acetaminophen  and salicylate levels were undetectable.  Urinalysis was normal. CT head and MRI brain without contrast showed no acute intracranial abnormality IV hydration was provided.  TRH was asked to admit patient    Review of Systems: As mentioned in the history of present illness. All other systems reviewed and are negative. Past Medical History:  Diagnosis Date   Arthritis    Cataract    Complication of anesthesia    Embolus (HCC) 12/2022   left Hip   GERD (gastroesophageal reflux disease)    Glaucoma    Hernia of unspecified site of abdominal cavity without mention of obstruction or gangrene    History of gastric ulcer    History of kidney stones    PONV (postoperative nausea and vomiting)    Ulcerative colitis    Past Surgical  History:  Procedure Laterality Date   BIOPSY  12/04/2018   Procedure: BIOPSY;  Surgeon: Golda Claudis PENNER, MD;  [facility];  Service: Endoscopy;;  cecum   COLONOSCOPY     COLONOSCOPY N/A 08/27/2012   Procedure: COLONOSCOPY;  Surgeon: Claudis PENNER Golda, MD;  [facility];  Service: Endoscopy;  Laterality: N/A;  1200-moved to 13:15 Ann to notify pt   COLONOSCOPY N/A 12/04/2018   Procedure: COLONOSCOPY;  Surgeon: Golda Claudis PENNER, MD;  [facility];  Service: Endoscopy;  Laterality: N/A;  155pm   COLONOSCOPY N/A 10/01/2023   Procedure: COLONOSCOPY;  Surgeon: Eartha Angelia Sieving, MD;  [facility];  Service: Gastroenterology;  Laterality: N/A;  8:15 am, asa 3   CYSTOSCOPY WITH INSERTION OF UROLIFT N/A 04/30/2022   Procedure: CYSTOSCOPY WITH INSERTION OF UROLIFT;  Surgeon: Sherrilee Belvie CROME, MD;  Location: [facility];  Service: Urology;  Laterality: N/A;   ESOPHAGOGASTRODUODENOSCOPY N/A 07/09/2023   Procedure: EGD (ESOPHAGOGASTRODUODENOSCOPY);  Surgeon: Eartha Angelia, Sieving, MD;  [facility];  Service: Gastroenterology;  Laterality: N/A;  7:30AM;ASA 3   ESOPHAGOGASTRODUODENOSCOPY (EGD) WITH PROPOFOL   01/04/2023   Procedure: ESOPHAGOGASTRODUODENOSCOPY (EGD) WITH PROPOFOL ;  Surgeon: Cindie Carlin POUR, DO;  [facility];  Service: Endoscopy;;   ESOPHAGOGASTRODUODENOSCOPY (EGD) WITH PROPOFOL  N/A 04/09/2023   Procedure: ESOPHAGOGASTRODUODENOSCOPY (EGD) WITH PROPOFOL ;  Surgeon: Eartha Angelia Sieving, MD;  [facility];  Service: Gastroenterology;  Laterality:  N/A;  7:30am;asa 3   HERNIA REPAIR Left    MOLE REMOVAL     From head , chest   UPPER GASTROINTESTINAL ENDOSCOPY     Social History:  reports that he has never smoked. He has never used smokeless tobacco. He reports that he does not drink alcohol  and does not use drugs.  Allergies[1]  Family History  Problem Relation Age of Onset   Arrhythmia Mother     Anuerysm Father    Hypertension Father    Diabetes Father    Healthy Sister    Healthy Sister    Irritable bowel syndrome Daughter    Thyroid  disease Daughter     Prior to Admission medications  Medication Sig Start Date End Date Taking? Authorizing Provider  acetaminophen  (TYLENOL ) 650 MG CR tablet Take 1,300 mg by mouth 2 (two) times daily.    [provider]  aspirin  81 MG chewable tablet Chew 1 tablet (81 mg total) by mouth daily. 12/17/23   Evonnie Lenis, MD  atorvastatin  (LIPITOR) 20 MG tablet Take 20 mg by mouth daily. 01/16/22   [provider]  clopidogrel  (PLAVIX ) 75 MG tablet Take 1 tablet (75 mg total) by mouth daily. 12/18/23   Evonnie Lenis, MD  esomeprazole  (NEXIUM ) 40 MG capsule Take 1 capsule (40 mg total) by mouth 2 (two) times daily before a meal. Do NOT take until done with 20 days of plavix  12/17/23   Tat, Lenis, MD  finasteride  (PROSCAR ) 5 MG tablet Take 1 tablet (5 mg total) by mouth daily. TAKE 1 TABLET(5 MG) BY MOUTH DAILY 10/23/23   McKenzie, Belvie CROME, MD  Guaifenesin [facility] MAXIMUM STRENGTH) 1200 MG TB12 Take 1 tablet by mouth 2 (two) times daily as needed (chest congestion).    [provider]  mirabegron  ER (MYRBETRIQ ) 50 MG TB24 tablet Take 1 tablet (50 mg total) by mouth daily. 10/23/23   McKenzie, Belvie CROME, MD  Multiple Vitamin (MULTIVITAMIN) tablet Take 1 tablet by mouth daily.    [provider]  sucralfate  (CARAFATE ) 1 GM/10ML suspension Take 10 mLs (1 g total) by mouth 4 (four) times daily -  before meals and at bedtime. 11/18/23   Shirlean Therisa ORN, NP  tamsulosin  (FLOMAX ) 0.4 MG CAPS capsule Take 1 capsule (0.4 mg total) by mouth daily. 10/23/23   McKenzie, Belvie CROME, MD  TREMFYA  PEN 200 MG/2ML SOAJ Inject 200 mg into the skin every 28 (twenty-eight) days. Patient not taking: Reported on 12/16/2023 12/10/23   Eartha Angelia Sieving, MD    Physical Exam: Vitals:   01/10/24 1715 01/10/24 1932 01/10/24 2000 01/10/24 2030  BP:  135/61 135/67 (!) 144/78 (!) 150/74  Pulse: 84 79 76 75  Resp: 18 16 16 14   Temp:  99.2 F (37.3 C)    TempSrc:  Oral    SpO2: 98% 97% 96% 97%  Weight:      Height:       General: Elderly male. Awake and alert and oriented x3. Not in any acute distress.  HEENT: NCAT.  PERRLA. EOMI. Sclerae anicteric.  Moist mucosal membranes. Neck: Neck supple without lymphadenopathy. No carotid bruits. No masses palpated.  Cardiovascular: Regular rate with normal S1-S2 sounds. No murmurs, rubs or gallops auscultated. No JVD.  Respiratory: Clear breath sounds.  No accessory muscle use. Abdomen: Soft, nontender, nondistended. Active bowel sounds. No masses or hepatosplenomegaly  Skin: No rashes, lesions, or ulcerations.  Dry, warm to touch. Musculoskeletal:  2+ dorsalis pedis and radial pulses. Good ROM.  No contractures  Psychiatric: Intact judgment and insight.  Mood appropriate to current condition. Neurologic: No focal neurological deficits. Strength is 5/5 x 4.  CN II - XII grossly intact, NIHSS 0.  Assessment and Plan: TIA Patient's symptoms resolved while in the ED Patient will be admitted to telemetry unit  CT head and MRI of the head showed no acute intracranial abnormality Echocardiogram in the morning Continue aspirin , Plavix  and statin Continue fall precautions and neuro checks Lipid panel and hemoglobin A1c will be checked Continue PT/SLP/OT eval and treat Bedside swallow eval by nursing prior to diet Consider neurology consult status post imaging studies   Recurrent falls Continue fall precaution Continue PT/OT eval and treat  Mixed hyperlipidemia Continue Lipitor   GERD/duodenal ulcer Continue Carafate , Protonix    Ulcerative colitis Patient is on Tremfya  q. 28 days   BPH Continue Myrbetriq  and finasteride    Advance Care Planning: Full code  Consults: None  Family Communication: None at bedside  Severity of Illness: The appropriate patient status for this patient  is OBSERVATION. Observation status is judged to be reasonable and necessary in order to provide the required intensity of service to ensure the patient's safety. The patient's presenting symptoms, physical exam findings, and initial radiographic and laboratory data in the context of their medical condition is felt to place them at decreased risk for further clinical deterioration. Furthermore, it is anticipated that the patient will be medically stable for discharge from the hospital within 2 midnights of admission.   Author: Donnah Levert, DO 01/10/2024 10:00 PM  For on call review [email or web address].      [1] No Known Allergies  "

## 2024-01-10 NOTE — ED Notes (Signed)
 Pt is alert to person, event, and time not place.

## 2024-01-10 NOTE — ED Triage Notes (Signed)
 Pt BIB family for multiple falls, incontinence of bowl and bladder. Pt had stroke 4 weeks ago. Pt fell two night ago out of bed with no other symptoms. Per family he fell again this morning around 0630. Pt had a lean to the rt and balances problems. NIH during triage 0.

## 2024-01-11 ENCOUNTER — Observation Stay (HOSPITAL_COMMUNITY)

## 2024-01-11 DIAGNOSIS — R32 Unspecified urinary incontinence: Secondary | ICD-10-CM | POA: Diagnosis present

## 2024-01-11 DIAGNOSIS — Z1152 Encounter for screening for COVID-19: Secondary | ICD-10-CM | POA: Diagnosis not present

## 2024-01-11 DIAGNOSIS — Z833 Family history of diabetes mellitus: Secondary | ICD-10-CM | POA: Diagnosis not present

## 2024-01-11 DIAGNOSIS — Z7902 Long term (current) use of antithrombotics/antiplatelets: Secondary | ICD-10-CM | POA: Diagnosis not present

## 2024-01-11 DIAGNOSIS — E782 Mixed hyperlipidemia: Secondary | ICD-10-CM | POA: Diagnosis present

## 2024-01-11 DIAGNOSIS — G459 Transient cerebral ischemic attack, unspecified: Secondary | ICD-10-CM

## 2024-01-11 DIAGNOSIS — K219 Gastro-esophageal reflux disease without esophagitis: Secondary | ICD-10-CM | POA: Diagnosis present

## 2024-01-11 DIAGNOSIS — H409 Unspecified glaucoma: Secondary | ICD-10-CM | POA: Diagnosis present

## 2024-01-11 DIAGNOSIS — Z79899 Other long term (current) drug therapy: Secondary | ICD-10-CM | POA: Diagnosis not present

## 2024-01-11 DIAGNOSIS — R296 Repeated falls: Secondary | ICD-10-CM | POA: Diagnosis present

## 2024-01-11 DIAGNOSIS — E876 Hypokalemia: Secondary | ICD-10-CM | POA: Diagnosis present

## 2024-01-11 DIAGNOSIS — R351 Nocturia: Secondary | ICD-10-CM | POA: Diagnosis present

## 2024-01-11 DIAGNOSIS — G9341 Metabolic encephalopathy: Secondary | ICD-10-CM | POA: Diagnosis present

## 2024-01-11 DIAGNOSIS — Z882 Allergy status to sulfonamides status: Secondary | ICD-10-CM | POA: Diagnosis not present

## 2024-01-11 DIAGNOSIS — G3184 Mild cognitive impairment, so stated: Secondary | ICD-10-CM | POA: Diagnosis present

## 2024-01-11 DIAGNOSIS — R4182 Altered mental status, unspecified: Secondary | ICD-10-CM | POA: Diagnosis not present

## 2024-01-11 DIAGNOSIS — R569 Unspecified convulsions: Secondary | ICD-10-CM | POA: Diagnosis not present

## 2024-01-11 DIAGNOSIS — Z8249 Family history of ischemic heart disease and other diseases of the circulatory system: Secondary | ICD-10-CM | POA: Diagnosis not present

## 2024-01-11 DIAGNOSIS — J690 Pneumonitis due to inhalation of food and vomit: Secondary | ICD-10-CM | POA: Diagnosis present

## 2024-01-11 DIAGNOSIS — Z8673 Personal history of transient ischemic attack (TIA), and cerebral infarction without residual deficits: Secondary | ICD-10-CM | POA: Diagnosis not present

## 2024-01-11 DIAGNOSIS — D649 Anemia, unspecified: Secondary | ICD-10-CM | POA: Diagnosis present

## 2024-01-11 DIAGNOSIS — R911 Solitary pulmonary nodule: Secondary | ICD-10-CM | POA: Diagnosis present

## 2024-01-11 DIAGNOSIS — Z86718 Personal history of other venous thrombosis and embolism: Secondary | ICD-10-CM | POA: Diagnosis not present

## 2024-01-11 DIAGNOSIS — Z7982 Long term (current) use of aspirin: Secondary | ICD-10-CM | POA: Diagnosis not present

## 2024-01-11 DIAGNOSIS — K5080 Crohn's disease of both small and large intestine without complications: Secondary | ICD-10-CM | POA: Diagnosis present

## 2024-01-11 DIAGNOSIS — N400 Enlarged prostate without lower urinary tract symptoms: Secondary | ICD-10-CM | POA: Diagnosis present

## 2024-01-11 LAB — COMPREHENSIVE METABOLIC PANEL WITH GFR
ALT: 17 U/L (ref 0–44)
AST: 26 U/L (ref 15–41)
Albumin: 3.6 g/dL (ref 3.5–5.0)
Alkaline Phosphatase: 44 U/L (ref 38–126)
Anion gap: 8 (ref 5–15)
BUN: 10 mg/dL (ref 8–23)
CO2: 27 mmol/L (ref 22–32)
Calcium: 9.4 mg/dL (ref 8.9–10.3)
Chloride: 105 mmol/L (ref 98–111)
Creatinine, Ser: 0.79 mg/dL (ref 0.61–1.24)
GFR, Estimated: >60 mL/min
Glucose, Bld: 116 mg/dL — ABNORMAL HIGH (ref 70–99)
Potassium: 3.7 mmol/L (ref 3.5–5.1)
Sodium: 139 mmol/L (ref 135–145)
Total Bilirubin: 0.6 mg/dL (ref 0.0–1.2)
Total Protein: 6 g/dL — ABNORMAL LOW (ref 6.5–8.1)

## 2024-01-11 LAB — CBC
HCT: 31 % — ABNORMAL LOW (ref 39.0–52.0)
Hemoglobin: 10.4 g/dL — ABNORMAL LOW (ref 13.0–17.0)
MCH: 30 pg (ref 26.0–34.0)
MCHC: 33.5 g/dL (ref 30.0–36.0)
MCV: 89.3 fL (ref 80.0–100.0)
Platelets: 137 K/uL — ABNORMAL LOW (ref 150–400)
RBC: 3.47 MIL/uL — ABNORMAL LOW (ref 4.22–5.81)
RDW: 13.1 % (ref 11.5–15.5)
WBC: 8.4 K/uL (ref 4.0–10.5)
nRBC: 0 % (ref 0.0–0.2)

## 2024-01-11 LAB — ECHOCARDIOGRAM LIMITED
AR max vel: 1.37 cm2
AV Area VTI: 1.33 cm2
AV Area mean vel: 1.31 cm2
AV Mean grad: 25 mmHg
AV Peak grad: 46.2 mmHg
Ao pk vel: 3.4 m/s
Area-P 1/2: 4.06 cm2
Height: 68 in
S' Lateral: 2.5 cm
Single Plane A4C EF: 57.9 %
Weight: 2825.42 [oz_av]

## 2024-01-11 LAB — LIPID PANEL
Cholesterol: 109 mg/dL (ref 0–200)
HDL: 45 mg/dL
LDL Cholesterol: 47 mg/dL (ref 0–99)
Total CHOL/HDL Ratio: 2.4 ratio
Triglycerides: 85 mg/dL
VLDL: 17 mg/dL (ref 0–40)

## 2024-01-11 LAB — MAGNESIUM: Magnesium: 2.1 mg/dL (ref 1.7–2.4)

## 2024-01-11 LAB — MRSA NEXT GEN BY PCR, NASAL: MRSA by PCR Next Gen: NOT DETECTED

## 2024-01-11 LAB — LACTIC ACID, PLASMA
Lactic Acid, Venous: 1.1 mmol/L (ref 0.5–1.9)
Lactic Acid, Venous: 1.2 mmol/L (ref 0.5–1.9)

## 2024-01-11 LAB — PHOSPHORUS: Phosphorus: 2.5 mg/dL (ref 2.5–4.6)

## 2024-01-11 MED ORDER — MIRABEGRON ER 25 MG PO TB24
50.0000 mg | ORAL_TABLET | Freq: Every day | ORAL | Status: DC
  Administered 2024-01-11 – 2024-01-15 (×5): 50 mg via ORAL
  Filled 2024-01-11 (×2): qty 2

## 2024-01-11 MED ORDER — PIPERACILLIN-TAZOBACTAM 3.375 G IVPB
3.3750 g | Freq: Three times a day (TID) | INTRAVENOUS | Status: AC
  Administered 2024-01-11 – 2024-01-15 (×12): 3.375 g via INTRAVENOUS
  Filled 2024-01-11 (×5): qty 50

## 2024-01-11 MED ORDER — SUCRALFATE 1 GM/10ML PO SUSP
1.0000 g | Freq: Three times a day (TID) | ORAL | Status: DC
  Administered 2024-01-11 – 2024-01-15 (×18): 1 g via ORAL
  Filled 2024-01-11 (×12): qty 10

## 2024-01-11 MED ORDER — FINASTERIDE 5 MG PO TABS
5.0000 mg | ORAL_TABLET | Freq: Every day | ORAL | Status: DC
  Administered 2024-01-11 – 2024-01-15 (×5): 5 mg via ORAL
  Filled 2024-01-11 (×3): qty 1

## 2024-01-11 MED ORDER — CLOPIDOGREL BISULFATE 75 MG PO TABS
75.0000 mg | ORAL_TABLET | Freq: Every day | ORAL | Status: DC
  Administered 2024-01-11 – 2024-01-15 (×5): 75 mg via ORAL
  Filled 2024-01-11 (×2): qty 1

## 2024-01-11 MED ORDER — VANCOMYCIN HCL 1750 MG/350ML IV SOLN
1750.0000 mg | INTRAVENOUS | Status: DC
  Administered 2024-01-11 – 2024-01-12 (×2): 1750 mg via INTRAVENOUS
  Filled 2024-01-11 (×2): qty 350

## 2024-01-11 MED ORDER — PANTOPRAZOLE SODIUM 40 MG PO TBEC
80.0000 mg | DELAYED_RELEASE_TABLET | Freq: Every day | ORAL | Status: DC
  Administered 2024-01-11 – 2024-01-15 (×5): 80 mg via ORAL
  Filled 2024-01-11 (×2): qty 2

## 2024-01-11 MED ORDER — STROKE: EARLY STAGES OF RECOVERY BOOK
Freq: Once | Status: DC
  Filled 2024-01-11: qty 1

## 2024-01-11 MED ORDER — ATORVASTATIN CALCIUM 20 MG PO TABS
20.0000 mg | ORAL_TABLET | Freq: Every day | ORAL | Status: DC
  Administered 2024-01-11 – 2024-01-15 (×5): 20 mg via ORAL
  Filled 2024-01-11: qty 1
  Filled 2024-01-11: qty 2

## 2024-01-11 MED ORDER — ASPIRIN 81 MG PO CHEW
81.0000 mg | CHEWABLE_TABLET | Freq: Every day | ORAL | Status: DC
  Administered 2024-01-11 – 2024-01-15 (×5): 81 mg via ORAL
  Filled 2024-01-11 (×3): qty 1

## 2024-01-11 MED ORDER — CHLORHEXIDINE GLUCONATE CLOTH 2 % EX PADS
6.0000 | MEDICATED_PAD | Freq: Every day | CUTANEOUS | Status: DC
  Administered 2024-01-12 – 2024-01-13 (×2): 6 via TOPICAL

## 2024-01-11 NOTE — Evaluation (Signed)
 Physical Therapy Evaluation Patient Details Name: David Mooney MRN: [id number] DOB: 1940-07-21 Today's Date: 01/11/2024  History of Present Illness  David Mooney is an 83 y.o. male with medical history significant of BPH, duodenal ulcer, ulcerative colitis, DVT, recent TIA (12/1) who presents to the emergency department due to altered mental status.  At bedside, patient was unable to provide details regarding why he came to the ED, history was obtained from ED PA and ED medical record.  Per report, patient last known well was 9 PM last night, he has had multiple falls when he gets up to use the restroom at night due to balancing issues whereby he tends to lean to the right.  Patient also has needed assistance in getting up from sitting position, has had some urinary incontinence (changed from baseline).  No report of vomiting, diarrhea, fever or chills.   Clinical Impression  Patient agreeable to PT evaluation. Patient is alert to self only this date and requires some inc time for processing throughout subjective history at times due to difficulty with word finding. No family are present at beginning of session but wife and daughter arrive at end of session. They report, at baseline, pt is able to ambulate without AD and independent with ADLs. However, in the last few days pt has had some falls and required assist with transfers and ambulation with AD. This date, pt requires assistance with bed mobility, and transfers and ambulation without and with RW, as pt is very unsteady once on his feet. Pt demonstrates no stark difference between R/LUE or R/LLE sensation, strength, or coordination but some mild nystagmus when checking smooth pursuits to the L. Pt becomes incontinent with urine as he stands as well. Required max assist with cleaning in standing, using RW for support. Multiple STS during cleaning, with pt requiring min/CGA throughout. Pt was able to change socks modifieid independent with  increased time. Pt returns to bed at end of session, bed alarm set, call button In reach, and family present. Due to recent change, patient will benefit from continued skilled physical therapy acutely and in recommended venue in order to address current deficits and improve overall function.        If plan is discharge home, recommend the following: A little help with walking and/or transfers;A little help with bathing/dressing/bathroom;Assistance with cooking/housework;Assist for transportation;Supervision due to cognitive status   Can travel by private vehicle        Equipment Recommendations None recommended by PT  Recommendations for Other Services       Functional Status Assessment Patient has had a recent decline in their functional status and demonstrates the ability to make significant improvements in function in a reasonable and predictable amount of time.     Precautions / Restrictions Precautions Precautions: Fall Recall of Precautions/Restrictions: Impaired Restrictions Weight Bearing Restrictions Per Provider Order: No      Mobility  Bed Mobility Overal bed mobility: Needs Assistance Bed Mobility: Supine to Sit, Sit to Supine     Supine to sit: Min assist Sit to supine: Min assist   General bed mobility comments: min assist throughout for trunk management/control/elevation. Pt able to initiate transfer with verbal cueing however.    Transfers Overall transfer level: Needs assistance Equipment used: Rolling walker (2 wheels), None Transfers: Sit to/from Stand, Bed to chair/wheelchair/BSC Sit to Stand: Min assist   Step pivot transfers: Min assist, Mod assist       General transfer comment: first STS from bed and bed  to chair transfer without AD, pt very unsteady on feet losing balance in multiple directions requiring ~mod assist, min assist with using RW as pt still unsteady but improves.    Ambulation/Gait Ambulation/Gait assistance: Min assist Gait  Distance (Feet): 70 Feet Assistive device: Rolling walker (2 wheels) Gait Pattern/deviations: Step-through pattern, Decreased step length - right, Decreased step length - left, Decreased stride length, Drifts right/left Gait velocity: Dec     General Gait Details: pt generally unsteady on his feet, requiring min assist with AD. demo slow labored movement with dec step lengths and gait speed, loses balance in multiple directions, verbal cueing for proximity to RW at times  Stairs            Wheelchair Mobility     Tilt Bed    Modified Rankin (Stroke Patients Only)       Balance Overall balance assessment: Needs assistance Sitting-balance support: Feet supported, No upper extremity supported Sitting balance-Leahy Scale: Fair Sitting balance - Comments: seated on stretcher/bed fair, seated in chair with back support good   Standing balance support: During functional activity, Reliant on assistive device for balance, Bilateral upper extremity supported Standing balance-Leahy Scale: Fair Standing balance comment: w/ RW                             Pertinent Vitals/Pain Pain Assessment Pain Assessment: No/denies pain    Home Living Family/patient expects to be discharged to:: Private residence Living Arrangements: Spouse/significant other Available Help at Discharge: Family;Available PRN/intermittently Type of Home: House Home Access: Stairs to enter Entrance Stairs-Rails: None Entrance Stairs-Number of Steps: 2-3   Home Layout: One level Home Equipment: Agricultural Consultant (2 wheels);Shower seat Additional Comments: pt wife unable to assist physically, daughter lives close by but unable to be available 24/7.    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 between R/L ROM, strength, coordination but all the above are limited bilaterally. Shoulder flexion AROM ~160 deg, MMT ~4/5, sensation WNL, finger to nose impaired bilat with 1st attempt but improves with 2nd, slight nystagmus w/ left gaze)    Lower Extremity Assessment Lower Extremity Assessment: Generalized weakness (No stark diff between R/L AROM, strength, sensation or coordination although slightly impaired bilat with each. Ankle DF MMT 4+/5, knee ext 4/5, hip flexion 4-/5, sensation WNL, heel to shin equal bilat, alternating foot taps w/ dec speed/impaired)       Communication   Communication Communication: No apparent difficulties    Cognition Arousal: Alert Behavior During Therapy: WFL for tasks assessed/performed   PT - Cognitive impairments: No family/caregiver present to determine baseline                       PT - Cognition Comments: wife and daughter arrive at end of session. At beginning of session pt oriented to self, disoriented to place and time (month). pt with difficulty with word finding at times and inc processing time required Following commands: Intact  Cueing Cueing Techniques: Verbal cues, Visual cues, Tactile cues     General Comments      Exercises     Assessment/Plan    PT Assessment Patient needs continued PT services;All further PT needs can be met in the next venue of care  PT Problem List Decreased strength;Decreased range of motion;Decreased activity tolerance;Decreased balance;Decreased mobility;Decreased cognition       PT Treatment Interventions DME instruction;Gait training;Stair training;Functional mobility training;Therapeutic activities;Therapeutic exercise;Balance  training;Neuromuscular re-education;Patient/family education    PT Goals (Current goals can be found in the Care Plan section)  Acute Rehab PT Goals Patient Stated Goal: return home after rehab stay PT Goal Formulation: With patient/family Time For Goal Achievement: 01/25/24 Potential to Achieve Goals: Good    Frequency Min 3X/week     Co-evaluation               AM-PAC PT 6 Clicks Mobility  Outcome Measure Help needed turning from your back to your side while in a flat bed without using bedrails?: A Little Help needed moving from lying on your back to sitting on the side of a flat bed without using bedrails?: A Little Help needed moving to and from a bed to a chair (including a wheelchair)?: A Little Help needed standing up from a chair using your arms (e.g., wheelchair or bedside chair)?: A Little Help needed to walk in hospital room?: A Little Help needed climbing 3-5 steps with a railing? : A Lot 6 Click Score: 17    End of Session Equipment Utilized During Treatment: Gait belt Activity Tolerance: Patient tolerated treatment well Patient left: in bed;with call bell/phone within reach;with bed alarm set;with family/visitor present Nurse Communication: Mobility status PT Visit Diagnosis: Unsteadiness on feet (R26.81);Other abnormalities of gait and mobility (R26.89);Muscle weakness (generalized) (M62.81);History of falling (Z91.81);Difficulty in walking, not elsewhere classified (R26.2);Other symptoms and signs involving the nervous system (R29.898)    Time: 9155-9059 PT Time Calculation (min) (ACUTE ONLY): 56 min   Charges:   PT Evaluation $PT Eval Moderate Complexity: 1 Mod   PT General Charges $$ ACUTE PT VISIT: 1 Visit         11:10 AM, 01/11/2024 Rosaria Settler, PT, DPT [place] with [facility]

## 2024-01-11 NOTE — Care Management Obs Status (Signed)
 MEDICARE OBSERVATION STATUS NOTIFICATION   Patient Details  Name: David Mooney MRN: [id number] Date of Birth: 18-Apr-1940   Medicare Observation Status Notification Given:  Yes    [name], RN 01/11/2024, 12:02 PM

## 2024-01-11 NOTE — Plan of Care (Signed)
" °  Problem: Acute Rehab PT Goals(only PT should resolve) Goal: Pt Will Go Supine/Side To Sit Outcome: Progressing Flowsheets (Taken 01/11/2024 1112) Pt will go Supine/Side to Sit: with supervision Goal: Patient Will Transfer Sit To/From Stand Outcome: Progressing Flowsheets (Taken 01/11/2024 1112) Patient will transfer sit to/from stand: with supervision Goal: Pt Will Transfer Bed To Chair/Chair To Bed Outcome: Progressing Flowsheets (Taken 01/11/2024 1112) Pt will Transfer Bed to Chair/Chair to Bed: with contact guard assist Goal: Pt Will Ambulate Outcome: Progressing Flowsheets (Taken 01/11/2024 1112) Pt will Ambulate:  100 feet  with rolling walker  with contact guard assist Goal: Pt Will Go Up/Down Stairs Outcome: Progressing Flowsheets (Taken 01/11/2024 1112) Pt will Go Up / Down Stairs:  1-2 stairs  with contact guard assist   11:13 AM, 01/11/2024 Rosaria Settler, PT, DPT [place] with [facility]  "

## 2024-01-11 NOTE — Progress Notes (Signed)
 Patient with prior admissions on 7/29 - 08/17/23 for SBO and 11/30 - 12/17/23 for TIA / Rt Hemiparesis, was readmitted yesterday s/p fall with suspected TIA, now with possible aspiration and started on IV Abx.  CM met with Patient, Wife, & Dtr - Melissa at bedside, confirmed the patient lives in [place] home with spouse, was independent prior to admission with intermittent use of RW, and open with [facility] for PT that was arranged upon his discharge on 12/17/23.    PT evaluated patient while he was in the ED and recommended SNF, family aware and are amendable to SNF for STR and prefers a facility that is closest to their home in [place].  CM provided family with print-out from Medicare.gov Care-Compare, with listing of all SNF's within 25 mile radius of their home.  CM explained the Star ratings and annotated mileage for each, and encouraged the family to pick top 3 choices and visit those in person to determine their suitability.  IPCM team will continue to follow along and assist as appropriate in developing a safe discharge plan.     01/11/24 1631  TOC Brief Assessment  Insurance and Status Reviewed  Patient has primary care physician Yes (Dr Marcey Messier)  Home environment has been reviewed 1-lvl Home with Spouse  Prior level of function: Independent with occasional use of RW  Prior/Current Home Services Current home services [facility] PT)  Social Drivers of Health Review SDOH reviewed no interventions necessary  Readmission risk has been reviewed Yes  Transition of care needs transition of care needs identified, TOC will continue to follow

## 2024-01-11 NOTE — NC FL2 (Signed)
 " [place]  MEDICAID FL2 LEVEL OF CARE FORM     IDENTIFICATION  Patient Name: David Mooney Birthdate: Sep 13, 1940 Sex: male Admission Date (Current Location): 01/10/2024  [facility] and [place] Number:  [employer] and Address:  [facility],  [address], [place] [address]      Provider Number: [id number]  Attending Physician Name and Address:  Maree Adron BIRCH, DO  Relative Name and Phone Number:  [name]: [phone]    Current Level of Care: Hospital Recommended Level of Care: Skilled Nursing Facility Prior Approval Number:    Date Approved/Denied:   PASRR Number:    Discharge Plan: SNF    Current Diagnoses: Patient Active Problem List   Diagnosis Date Noted   Mild cognitive impairment 12/17/2023   CVA (cerebral vascular accident) (HCC) 12/16/2023   Right hemiparesis (HCC) 12/16/2023   TIA (transient ischemic attack) 12/16/2023   Crohn's ileocolitis (HCC) 09/12/2023   Peptic ulcer disease 08/15/2023   Small bowel obstruction (HCC) 08/15/2023   Right sided abdominal pain 08/14/2023   Crohn's disease of small intestine (HCC) 08/14/2023   SBO (small bowel obstruction) (HCC) 08/13/2023   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 07/09/2023   Acute esophagitis 04/09/2023   Duodenal ulcer 02/18/2023   Acute deep vein thrombosis (DVT) of femoral vein of left lower extremity (HCC) 01/11/2023   Esophagitis, unspecified with bleeding 01/04/2023   Abnormal CT of the abdomen 01/02/2023   Kidney stones 12/11/2022   Nocturia 03/28/2020   Urgency of urination 03/28/2020   Benign prostatic hyperplasia with urinary obstruction 03/28/2020   Anemia 10/22/2018   Fall 01/02/2018   GERD (gastroesophageal reflux disease) 09/15/2013   Flatulence 09/15/2013   Ulcerative colitis (HCC) 07/24/2011   Glaucoma 07/24/2011    Orientation RESPIRATION BLADDER Height & Weight     Self  Normal Indwelling catheter, Incontinent  (External Catheter) Weight: 80.1 kg Height:  5' 8 (172.7 cm)  BEHAVIORAL SYMPTOMS/MOOD NEUROLOGICAL BOWEL NUTRITION STATUS      Continent Diet  AMBULATORY STATUS COMMUNICATION OF NEEDS Skin   Limited Assist Verbally Other (Comment) (Redness bilateral Buttocks)                       Personal Care Assistance Level of Assistance  Bathing, Feeding, Dressing Bathing Assistance: Limited assistance Feeding assistance: Limited assistance Dressing Assistance: Limited assistance     Functional Limitations Info  Sight Sight Info: Impaired        SPECIAL CARE FACTORS FREQUENCY  PT (By licensed PT), OT (By licensed OT), Speech therapy     PT Frequency: 5 x /week OT Frequency: 3 x / week     Speech Therapy Frequency: As Ordered      Contractures Contractures Info: Not present    Additional Factors Info                  Current Medications (01/11/2024):  This is the current hospital active medication list Current Facility-Administered Medications  Medication Dose Route Frequency Provider Last Rate Last Admin   [START ON 01/12/2024]  stroke: early stages of recovery book   Does not apply Once Adefeso, Oladapo, DO       acetaminophen  (TYLENOL ) tablet 650 mg  650 mg Oral Q6H PRN Adefeso, Oladapo, DO   650 mg at 01/11/24 1156   Or   acetaminophen  (TYLENOL ) suppository 650 mg  650 mg Rectal Q6H PRN Adefeso, Oladapo, DO       aspirin   chewable tablet 81 mg  81 mg Oral Daily Adefeso, Oladapo, DO   81 mg at 01/11/24 1025   atorvastatin  (LIPITOR) tablet 20 mg  20 mg Oral Daily Adefeso, Oladapo, DO   20 mg at 01/11/24 1025   [START ON 01/12/2024] Chlorhexidine  Gluconate Cloth 2 % PADS 6 each  6 each Topical Q0600 Adefeso, Oladapo, DO       clopidogrel  (PLAVIX ) tablet 75 mg  75 mg Oral Daily Adefeso, Oladapo, DO   75 mg at 01/11/24 1025   enoxaparin  (LOVENOX ) injection 40 mg  40 mg Subcutaneous Q24H Adefeso, Oladapo, DO   40 mg at 01/11/24 1033   finasteride  (PROSCAR ) tablet 5 mg  5  mg Oral Daily Adefeso, Oladapo, DO   5 mg at 01/11/24 1025   mirabegron  ER (MYRBETRIQ ) tablet 50 mg  50 mg Oral Daily Adefeso, Oladapo, DO   50 mg at 01/11/24 1024   ondansetron  (ZOFRAN ) tablet 4 mg  4 mg Oral Q6H PRN Adefeso, Oladapo, DO       Or   ondansetron  (ZOFRAN ) injection 4 mg  4 mg Intravenous Q6H PRN Adefeso, Oladapo, DO       pantoprazole  (PROTONIX ) EC tablet 80 mg  80 mg Oral Q1200 Adefeso, Oladapo, DO   80 mg at 01/11/24 1159   piperacillin -tazobactam (ZOSYN ) IVPB 3.375 g  3.375 g Intravenous Q8H Tanda Dempsey SAUNDERS, RPH 12.5 mL/hr at 01/11/24 1509 Infusion Verify at 01/11/24 1509   sucralfate  (CARAFATE ) 1 GM/10ML suspension 1 g  1 g Oral TID AC & HS Adefeso, Oladapo, DO   1 g at 01/11/24 1625   vancomycin  (VANCOREADY) IVPB 1750 mg/350 mL  1,750 mg Intravenous Q24H Tanda Dempsey SAUNDERS, RPH 175 mL/hr at 01/11/24 1629 1,750 mg at 01/11/24 1629     Discharge Medications: Please see discharge summary for a list of discharge medications.  Relevant Imaging Results:  Relevant Lab Results:   Additional Information    [name], RN     "

## 2024-01-11 NOTE — ED Notes (Signed)
 Pt assessing pt at this time.

## 2024-01-11 NOTE — Progress Notes (Signed)
" °  Echocardiogram 2D Echocardiogram has been performed.  Koleen KANDICE Popper, RDCS 01/11/2024, 2:54 PM "

## 2024-01-11 NOTE — Progress Notes (Signed)
 " PROGRESS NOTE    JAGGAR BENKO  FMW:[id number] DOB: Mar 30, 1940 DOA: 01/10/2024 PCP: Lari Elspeth BRAVO, MD   Brief Narrative:    DAYDEN VIVERETTE is an 83 y.o. male with medical history significant of BPH, duodenal ulcer, ulcerative colitis, DVT, recent TIA (12/1) who presents to the emergency department due to altered mental status and multiple falls overnight.  He was admitted with concerns of TIA with patient last known being well at 9 PM last night.  Brain MRI negative and PT recommending SNF.  2D echocardiogram currently pending.  Patient now noted to have fever as well as tachypnea and tachycardia and will require further evaluation with repeat chest x-ray.  Assessment & Plan:   Principal Problem:   TIA (transient ischemic attack)  Assessment and Plan:   TIA Patient's symptoms resolved while in the ED Patient will be admitted to telemetry unit  CT head and MRI of the head showed no acute intracranial abnormality Echocardiogram limited pending Continue aspirin , Plavix  and statin Continue fall precautions and neuro checks Lipid panel and hemoglobin A1c will be checked Continue PT/SLP/OT eval and treat Bedside swallow eval by nursing prior to diet Consider neurology consult status post imaging studies   Fever with tachycardia and tachypnea Suspect possible aspiration with chest x-ray ordered Blood cultures ordered Transfer to stepdown unit Await further study results and consider initiation of antibiotics Tylenol  for fever Dysphagia 3 diet   Recurrent falls PT recommending SNF   Mixed hyperlipidemia Continue Lipitor   GERD/duodenal ulcer Continue Carafate , Protonix    Ulcerative colitis Patient is on Tremfya  q. 28 days   BPH Continue Myrbetriq  and finasteride     DVT prophylaxis:Lovenox  Code Status: Full Family Communication: None at bedside Disposition Plan:  Status is: Observation The patient will require care spanning > 2 midnights and should be moved  to inpatient because: Need for IV medications and further evaluation.  Consultants:  None  Procedures:  None  Antimicrobials:  None   Subjective: Patient seen and evaluated today with no new acute complaints or concerns. No acute concerns or events noted overnight.  He was doing well this morning until he started developing fevers as well as tachypnea and tachycardia now requiring transfer to stepdown unit.  Objective: Vitals:   01/11/24 1200 01/11/24 1215 01/11/24 1229 01/11/24 1235  BP: 125/70 (!) 109/56  (!) 96/58  Pulse: (!) 101 (!) 105 97 87  Resp: (!) 25 (!) 25 (!) 25 (!) 28  Temp:      TempSrc:      SpO2: 93% 94% 95% 94%  Weight:      Height:        Intake/Output Summary (Last 24 hours) at 01/11/2024 1242 Last data filed at 01/11/2024 0147 Gross per 24 hour  Intake 12 ml  Output --  Net 12 ml   Filed Weights   01/10/24 1422  Weight: 77.1 kg    Examination:  General exam: Appears calm and comfortable  Respiratory system: Clear to auscultation. Respiratory effort normal. Cardiovascular system: S1 & S2 heard, RRR.  Gastrointestinal system: Abdomen is soft Central nervous system: Alert and awake Extremities: No edema Skin: No significant lesions noted Psychiatry: Flat affect.    Data Reviewed: I have personally reviewed following labs and imaging studies  CBC: Recent Labs  Lab 01/10/24 1445 01/11/24 0450  WBC 10.9* 8.4  HGB 12.0* 10.4*  HCT 35.7* 31.0*  MCV 89.0 89.3  PLT 150 137*   Basic Metabolic Panel: Recent Labs  Lab 01/10/24  1445 01/11/24 0450  NA 140 139  K 4.0 3.7  CL 104 105  CO2 23 27  GLUCOSE 113* 116*  BUN 12 10  CREATININE 0.93 0.79  CALCIUM  9.9 9.4  MG  --  2.1  PHOS  --  2.5   GFR: Estimated Creatinine Clearance: 67.2 mL/min (by C-G formula based on SCr of 0.79 mg/dL). Liver Function Tests: Recent Labs  Lab 01/10/24 1445 01/11/24 0450  AST 30 26  ALT 19 17  ALKPHOS 49 44  BILITOT 0.6 0.6  PROT 7.0 6.0*   ALBUMIN 4.3 3.6   No results for input(s): LIPASE, AMYLASE in the last 168 hours. No results for input(s): AMMONIA in the last 168 hours. Coagulation Profile: No results for input(s): INR, PROTIME in the last 168 hours. Cardiac Enzymes: No results for input(s): CKTOTAL, CKMB, CKMBINDEX, TROPONINI in the last 168 hours. BNP (last 3 results) No results for input(s): PROBNP in the last 8760 hours. HbA1C: No results for input(s): HGBA1C in the last 72 hours. CBG: Recent Labs  Lab 01/10/24 1445  GLUCAP 113*   Lipid Profile: Recent Labs    01/11/24 0450  CHOL 109  HDL 45  LDLCALC 47  TRIG 85  CHOLHDL 2.4   Thyroid  Function Tests: No results for input(s): TSH, T4TOTAL, FREET4, T3FREE, THYROIDAB in the last 72 hours. Anemia Panel: No results for input(s): VITAMINB12, FOLATE, FERRITIN, TIBC, IRON, RETICCTPCT in the last 72 hours. Sepsis Labs: No results for input(s): PROCALCITON, LATICACIDVEN in the last 168 hours.  Recent Results (from the past 240 hours)  Resp panel by RT-PCR (RSV, Flu A&B, Covid) Anterior Nasal Swab     Status: None   Collection Time: 01/10/24  2:25 PM   Specimen: Anterior Nasal Swab  Result Value Ref Range Status   SARS Coronavirus 2 by RT PCR NEGATIVE NEGATIVE Final    Comment: (NOTE) SARS-CoV-2 target nucleic acids are NOT DETECTED.  The SARS-CoV-2 RNA is generally detectable in upper respiratory specimens during the acute phase of infection. The lowest concentration of SARS-CoV-2 viral copies this assay can detect is 138 copies/mL. A negative result does not preclude SARS-Cov-2 infection and should not be used as the sole basis for treatment or other patient management decisions. A negative result may occur with  improper specimen collection/handling, submission of specimen other than nasopharyngeal swab, presence of viral mutation(s) within the areas targeted by this assay, and inadequate number of  viral copies(<138 copies/mL). A negative result must be combined with clinical observations, patient history, and epidemiological information. The expected result is Negative.  Fact Sheet for Patients:  bloggercourse.com  Fact Sheet for Healthcare Providers:  seriousbroker.it  This test is no t yet approved or cleared by the [place]  FDA and  has been authorized for detection and/or diagnosis of SARS-CoV-2 by FDA under an Emergency Use Authorization (EUA). This EUA will remain  in effect (meaning this test can be used) for the duration of the COVID-19 declaration under Section 564(b)(1) of the Act, 21 U.S.C.section 360bbb-3(b)(1), unless the authorization is terminated  or revoked sooner.       Influenza A by PCR NEGATIVE NEGATIVE Final   Influenza B by PCR NEGATIVE NEGATIVE Final    Comment: (NOTE) The Xpert Xpress SARS-CoV-2/FLU/RSV plus assay is intended as an aid in the diagnosis of influenza from Nasopharyngeal swab specimens and should not be used as a sole basis for treatment. Nasal washings and aspirates are unacceptable for Xpert Xpress SARS-CoV-2/FLU/RSV testing.  Fact Sheet for  Patients: bloggercourse.com  Fact Sheet for Healthcare Providers: seriousbroker.it  This test is not yet approved or cleared by the [place]  FDA and has been authorized for detection and/or diagnosis of SARS-CoV-2 by FDA under an Emergency Use Authorization (EUA). This EUA will remain in effect (meaning this test can be used) for the duration of the COVID-19 declaration under Section 564(b)(1) of the Act, 21 U.S.C. section 360bbb-3(b)(1), unless the authorization is terminated or revoked.     Resp Syncytial Virus by PCR NEGATIVE NEGATIVE Final    Comment: (NOTE) Fact Sheet for Patients: bloggercourse.com  Fact Sheet for Healthcare  Providers: seriousbroker.it  This test is not yet approved or cleared by the [place]  FDA and has been authorized for detection and/or diagnosis of SARS-CoV-2 by FDA under an Emergency Use Authorization (EUA). This EUA will remain in effect (meaning this test can be used) for the duration of the COVID-19 declaration under Section 564(b)(1) of the Act, 21 U.S.C. section 360bbb-3(b)(1), unless the authorization is terminated or revoked.  Performed at [facility], [address], [place], [place] [address]          Radiology Studies: DG Chest 2 View Result Date: 01/10/2024 EXAM: 2 VIEW(S) XRAY OF THE CHEST 01/10/2024 06:27:00 PM COMPARISON: 08/13/2023 CLINICAL HISTORY: AMS FINDINGS: LUNGS AND PLEURA: Linear opacity at left base consistent with scarring or atelectasis. No pleural effusion. No pneumothorax. HEART AND MEDIASTINUM: Calcified aorta. BONES AND SOFT TISSUES: No acute osseous abnormality. IMPRESSION: 1. No acute cardiopulmonary abnormality. Electronically signed by: Morgane Naveau MD 01/10/2024 08:30 PM EST RP Workstation: [id number]   MR BRAIN WO CONTRAST Result Date: 01/10/2024 EXAM: MRI BRAIN WITHOUT CONTRAST 01/10/2024 05:40:24 PM TECHNIQUE: Multiplanar multisequence MRI of the head/brain was performed without the administration of intravenous contrast. COMPARISON: CT head earlier today. CLINICAL HISTORY: Recent TIA, AMS, ataxic FINDINGS: BRAIN AND VENTRICLES: No acute infarct. No intracranial hemorrhage. No mass. No midline shift. No hydrocephalus. The sella is unremarkable. Normal flow voids. ORBITS: No acute abnormality. SINUSES AND MASTOIDS: No acute abnormality. BONES AND SOFT TISSUES: Normal marrow signal. No acute soft tissue abnormality. IMPRESSION: 1. No acute intracranial abnormality. Electronically signed by: Gilmore Molt 01/10/2024 07:05 PM EST RP Workstation: [id number]   CT Cervical Spine Wo Contrast Result Date: 01/10/2024 EXAM:  CT CERVICAL SPINE WITHOUT CONTRAST 01/10/2024 03:34:09 PM TECHNIQUE: CT of the cervical spine was performed without the administration of intravenous contrast. Multiplanar reformatted images are provided for review. Automated exposure control, iterative reconstruction, and/or weight based adjustment of the mA/kV was utilized to reduce the radiation dose to as low as reasonably achievable. COMPARISON: 01/02/2018 CLINICAL HISTORY: Neck trauma (Age >= 65y) FINDINGS: BONES AND ALIGNMENT: Straightening of the normal cervical lordosis. Trace degenerative retrolisthesis of C3 on C4. Additional trace degenerative anterolisthesis of C7 on T1. No evidence of traumatic malalignment. No acute fracture. DEGENERATIVE CHANGES: Disc space narrowing and degenerative endplate osteophytes at multiple levels. Degenerative endplate changes greatest at C6-C7. Facet arthrosis and uncovertebral hypertrophy at multiple levels. No high grade osseous spinal canal stenosis. SOFT TISSUES: No prevertebral soft tissue swelling. IMPRESSION: 1. No evidence of acute traumatic injury. Electronically signed by: Donnice Mania MD 01/10/2024 03:48 PM EST RP Workstation: [id number]   CT Head Wo Contrast Result Date: 01/10/2024 EXAM: CT HEAD WITHOUT CONTRAST 01/10/2024 03:34:09 PM TECHNIQUE: CT of the head was performed without the administration of intravenous contrast. Automated exposure control, iterative reconstruction, and/or weight based adjustment of the mA/kV was utilized to reduce the radiation dose to as low as  reasonably achievable. COMPARISON: 12/15/2023 CLINICAL HISTORY: Head trauma, minor (Age >= 65y); Mental status change, unknown cause. FINDINGS: BRAIN AND VENTRICLES: No acute hemorrhage. No evidence of acute infarct. No hydrocephalus. No extra-axial collection. No mass effect or midline shift. 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 Age-related cerebral volume loss. Atherosclerosis of the carotid  siphons and intracranial vertebral arteries. There is tortuosity of the basilar artery without evidence of dilatation. ORBITS: Bilateral lens replacement. SINUSES: No acute abnormality. SOFT TISSUES AND SKULL: No acute soft tissue abnormality. No skull fracture. IMPRESSION: 1. No acute intracranial abnormality. Electronically signed by: Donnice Mania MD 01/10/2024 03:43 PM EST RP Workstation: [id number]        Scheduled Meds:  [START ON 01/12/2024]  stroke: early stages of recovery book   Does not apply Once   aspirin   81 mg Oral Daily   atorvastatin   20 mg Oral Daily   [START ON 01/12/2024] Chlorhexidine  Gluconate Cloth  6 each Topical Q0600   clopidogrel   75 mg Oral Daily   enoxaparin  (LOVENOX ) injection  40 mg Subcutaneous Q24H   finasteride   5 mg Oral Daily   mirabegron  ER  50 mg Oral Daily   pantoprazole   80 mg Oral Q1200   sucralfate   1 g Oral TID AC & HS     LOS: 0 days    Time spent: 55 minutes    Mitsue Peery JONETTA Fairly, DO [facility]  If 7PM-7AM, please contact night-coverage [email or web address] 01/11/2024, 12:42 PM   "

## 2024-01-11 NOTE — Progress Notes (Signed)
 Pharmacy Antibiotic Note  David Mooney is a 83 y.o. male admitted on 01/10/2024 s/p fall with suspected TIA.  Pharmacy has been consulted for vancomycin  and zosyn  dosing for possible aspiration.  Patient febrile to 103, wbc normal. Renal function normal.  Vancomycin  1750 mg IV Q 24 hrs. Goal AUC 400-550. Expected AUC: 510 SCr used: 0.8 Zosyn  3.375g IV q8 hours   Height: 5' 8 (172.7 cm) Weight: 80.1 kg (176 lb 9.4 oz) IBW/kg (Calculated) : 68.4  Temp (24hrs), Avg:99.5 F (37.5 C), Min:98.1 F (36.7 C), Max:103.1 F (39.5 C)  Recent Labs  Lab 01/10/24 1445 01/11/24 0450  WBC 10.9* 8.4  CREATININE 0.93 0.79    Estimated Creatinine Clearance: 66.5 mL/min (by C-G formula based on SCr of 0.79 mg/dL).    Allergies[1]  Thank you for allowing pharmacy to be a part of this patients care.  Dempsey Blush PharmD., BCPS Clinical Pharmacist 01/11/2024 2:09 PM     [1] No Known Allergies

## 2024-01-12 DIAGNOSIS — G459 Transient cerebral ischemic attack, unspecified: Secondary | ICD-10-CM | POA: Diagnosis not present

## 2024-01-12 LAB — BASIC METABOLIC PANEL WITH GFR
Anion gap: 9 (ref 5–15)
BUN: 13 mg/dL (ref 8–23)
CO2: 25 mmol/L (ref 22–32)
Calcium: 9.4 mg/dL (ref 8.9–10.3)
Chloride: 104 mmol/L (ref 98–111)
Creatinine, Ser: 0.96 mg/dL (ref 0.61–1.24)
GFR, Estimated: >60 mL/min
Glucose, Bld: 119 mg/dL — ABNORMAL HIGH (ref 70–99)
Potassium: 3.5 mmol/L (ref 3.5–5.1)
Sodium: 138 mmol/L (ref 135–145)

## 2024-01-12 LAB — CBC
HCT: 32.7 % — ABNORMAL LOW (ref 39.0–52.0)
Hemoglobin: 10.8 g/dL — ABNORMAL LOW (ref 13.0–17.0)
MCH: 29.1 pg (ref 26.0–34.0)
MCHC: 33 g/dL (ref 30.0–36.0)
MCV: 88.1 fL (ref 80.0–100.0)
Platelets: 153 K/uL (ref 150–400)
RBC: 3.71 MIL/uL — ABNORMAL LOW (ref 4.22–5.81)
RDW: 13 % (ref 11.5–15.5)
WBC: 8.9 K/uL (ref 4.0–10.5)
nRBC: 0 % (ref 0.0–0.2)

## 2024-01-12 LAB — LIPID PANEL
Cholesterol: 105 mg/dL (ref 0–200)
HDL: 42 mg/dL
LDL Cholesterol: 46 mg/dL (ref 0–99)
Total CHOL/HDL Ratio: 2.5 ratio
Triglycerides: 83 mg/dL
VLDL: 17 mg/dL (ref 0–40)

## 2024-01-12 LAB — MAGNESIUM: Magnesium: 2.2 mg/dL (ref 1.7–2.4)

## 2024-01-12 MED ORDER — LATANOPROSTENE BUNOD 0.024 % OP SOLN
1.0000 [drp] | Freq: Every day | OPHTHALMIC | Status: DC
  Administered 2024-01-12: 1 [drp] via OPHTHALMIC

## 2024-01-12 NOTE — TOC Progression Note (Signed)
 Transition of Care [facility]) - Progression Note    Patient Details  Name: David Mooney MRN: [id number] Date of Birth: 1940-03-09  Transition of [facility] CM/SW Contact  [name], RN Phone Number: 01/12/2024, 11:24 AM  Clinical Narrative:    CM obtained PASRR# [id number] A, updated same on FL2 and generated new Note for Co-Sign by Dr Maree.   CM Team will continue to follow along and assist as appropriate with the discharge plan and associated arrangements  Expected Discharge Plan and Services SNF for short-term rehab  Social Drivers of Health (SDOH) Interventions SDOH Screenings   Food Insecurity: No Food Insecurity (01/11/2024)  Housing: Low Risk (01/11/2024)  Transportation Needs: No Transportation Needs (01/11/2024)  Utilities: Not At Risk (01/11/2024)  Depression (PHQ2-9): Low Risk (11/22/2023)  Social Connections: Socially Integrated (01/11/2024)  Tobacco Use: Low Risk (01/10/2024)    Readmission Risk Interventions    08/17/2023    8:02 AM 08/14/2023    9:18 AM  Readmission Risk Prevention Plan  Medication Screening Complete Complete  Transportation Screening Complete Complete

## 2024-01-12 NOTE — Plan of Care (Signed)

## 2024-01-12 NOTE — Progress Notes (Signed)
 " PROGRESS NOTE    David Mooney  FMW:[id number] DOB: Aug 29, 1940 DOA: 01/10/2024 PCP: Lari Elspeth BRAVO, MD   Brief Narrative:    David Mooney is an 83 y.o. male with medical history significant of BPH, duodenal ulcer, ulcerative colitis, DVT, recent TIA (12/1) who presents to the emergency department due to altered mental status and multiple falls overnight.  He was admitted with concerns of TIA with patient last known being well at 9 PM last night.  Brain MRI negative and PT recommending SNF.  2D echocardiogram currently pending.  Patient now noted to have fever as well as tachypnea and tachycardia and now appears to have pneumonia on chest x-ray and has been started on Zosyn  due to concerns for aspiration.  Currently on dysphagia 3 diet with SLP evaluation ordered.  PT recommending SNF on discharge.  Assessment & Plan:   Principal Problem:   TIA (transient ischemic attack)  Assessment and Plan:   TIA Patient's symptoms resolved while in the ED Patient will be admitted to telemetry unit  CT head and MRI of the head showed no acute intracranial abnormality Echocardiogram limited with LVEF 65-70% with grade 1 diastolic dysfunction Continue aspirin , Plavix  and statin Continue fall precautions and neuro checks Lipid panel with LDL 47 and hemoglobin A1c recently 5.5% PT/OT recommending SNF and SLP evaluation pending currently on dysphagia 3 diet Consider neurology evaluation by a.m. given possibility of MRI negative stroke  Suspected aspiration pneumonia Continue on Zosyn  Blood cultures with no growth to date Okay to transfer to telemetry Tylenol  for fever Dysphagia 3 diet   Recurrent falls PT recommending SNF   Mixed hyperlipidemia Continue Lipitor   GERD/duodenal ulcer Continue Carafate , Protonix    Ulcerative colitis Patient is on Tremfya  q. 28 days   BPH Continue Myrbetriq  and finasteride     DVT prophylaxis:Lovenox  Code Status: Full Family Communication:  None at bedside Disposition Plan:  Status is: Inpatient Remains inpatient appropriate because: Need for IV medications   Consultants:  None  Procedures:  None  Antimicrobials:  Anti-infectives (From admission, onward)    Start     Dose/Rate Route Frequency Ordered Stop   01/11/24 1600  vancomycin  (VANCOREADY) IVPB 1750 mg/350 mL        1,750 mg 175 mL/hr over 120 Minutes Intravenous Every 24 hours 01/11/24 1410     01/11/24 1500  piperacillin -tazobactam (ZOSYN ) IVPB 3.375 g        3.375 g 12.5 mL/hr over 240 Minutes Intravenous Every 8 hours 01/11/24 1410         Subjective: Patient seen and evaluated today with no new acute complaints or concerns. No acute concerns or events noted overnight.  His fevers are resolving and tachypnea is improved as well.  Objective: Vitals:   01/12/24 0600 01/12/24 0700 01/12/24 0749 01/12/24 0800  BP: (!) 154/72 (!) 149/68  123/62  Pulse: 81 81  87  Resp: (!) 23 (!) 24  (!) 23  Temp:   (!) 100.7 F (38.2 C)   TempSrc:   Oral   SpO2: 96% 95%  95%  Weight:      Height:        Intake/Output Summary (Last 24 hours) at 01/12/2024 1054 Last data filed at 01/12/2024 0901 Gross per 24 hour  Intake 807.27 ml  Output 900 ml  Net -92.73 ml   Filed Weights   01/10/24 1422 01/11/24 1300  Weight: 77.1 kg 80.1 kg    Examination:  General exam: Appears calm and comfortable  Respiratory system: Clear to auscultation. Respiratory effort normal. Cardiovascular system: S1 & S2 heard, RRR.  Gastrointestinal system: Abdomen is soft Central nervous system: Alert and awake Extremities: No edema Skin: No significant lesions noted Psychiatry: Flat affect.    Data Reviewed: I have personally reviewed following labs and imaging studies  CBC: Recent Labs  Lab 01/10/24 1445 01/11/24 0450 01/12/24 0540  WBC 10.9* 8.4 8.9  HGB 12.0* 10.4* 10.8*  HCT 35.7* 31.0* 32.7*  MCV 89.0 89.3 88.1  PLT 150 137* 153   Basic Metabolic  Panel: Recent Labs  Lab 01/10/24 1445 01/11/24 0450 01/12/24 0540  NA 140 139 138  K 4.0 3.7 3.5  CL 104 105 104  CO2 23 27 25   GLUCOSE 113* 116* 119*  BUN 12 10 13   CREATININE 0.93 0.79 0.96  CALCIUM  9.9 9.4 9.4  MG  --  2.1 2.2  PHOS  --  2.5  --    GFR: Estimated Creatinine Clearance: 55.4 mL/min (by C-G formula based on SCr of 0.96 mg/dL). Liver Function Tests: Recent Labs  Lab 01/10/24 1445 01/11/24 0450  AST 30 26  ALT 19 17  ALKPHOS 49 44  BILITOT 0.6 0.6  PROT 7.0 6.0*  ALBUMIN 4.3 3.6   No results for input(s): LIPASE, AMYLASE in the last 168 hours. No results for input(s): AMMONIA in the last 168 hours. Coagulation Profile: No results for input(s): INR, PROTIME in the last 168 hours. Cardiac Enzymes: No results for input(s): CKTOTAL, CKMB, CKMBINDEX, TROPONINI in the last 168 hours. BNP (last 3 results) No results for input(s): PROBNP in the last 8760 hours. HbA1C: No results for input(s): HGBA1C in the last 72 hours. CBG: Recent Labs  Lab 01/10/24 1445  GLUCAP 113*   Lipid Profile: Recent Labs    01/11/24 0450  CHOL 109  HDL 45  LDLCALC 47  TRIG 85  CHOLHDL 2.4   Thyroid  Function Tests: No results for input(s): TSH, T4TOTAL, FREET4, T3FREE, THYROIDAB in the last 72 hours. Anemia Panel: No results for input(s): VITAMINB12, FOLATE, FERRITIN, TIBC, IRON, RETICCTPCT in the last 72 hours. Sepsis Labs: Recent Labs  Lab 01/11/24 1534 01/11/24 1805  LATICACIDVEN 1.1 1.2    Recent Results (from the past 240 hours)  Resp panel by RT-PCR (RSV, Flu A&B, Covid) Anterior Nasal Swab     Status: None   Collection Time: 01/10/24  2:25 PM   Specimen: Anterior Nasal Swab  Result Value Ref Range Status   SARS Coronavirus 2 by RT PCR NEGATIVE NEGATIVE Final    Comment: (NOTE) SARS-CoV-2 target nucleic acids are NOT DETECTED.  The SARS-CoV-2 RNA is generally detectable in upper respiratory specimens during  the acute phase of infection. The lowest concentration of SARS-CoV-2 viral copies this assay can detect is 138 copies/mL. A negative result does not preclude SARS-Cov-2 infection and should not be used as the sole basis for treatment or other patient management decisions. A negative result may occur with  improper specimen collection/handling, submission of specimen other than nasopharyngeal swab, presence of viral mutation(s) within the areas targeted by this assay, and inadequate number of viral copies(<138 copies/mL). A negative result must be combined with clinical observations, patient history, and epidemiological information. The expected result is Negative.  Fact Sheet for Patients:  bloggercourse.com  Fact Sheet for Healthcare Providers:  seriousbroker.it  This test is no t yet approved or cleared by the [place]  FDA and  has been authorized for detection and/or diagnosis of SARS-CoV-2 by FDA under  an Emergency Use Authorization (EUA). This EUA will remain  in effect (meaning this test can be used) for the duration of the COVID-19 declaration under Section 564(b)(1) of the Act, 21 U.S.C.section 360bbb-3(b)(1), unless the authorization is terminated  or revoked sooner.       Influenza A by PCR NEGATIVE NEGATIVE Final   Influenza B by PCR NEGATIVE NEGATIVE Final    Comment: (NOTE) The Xpert Xpress SARS-CoV-2/FLU/RSV plus assay is intended as an aid in the diagnosis of influenza from Nasopharyngeal swab specimens and should not be used as a sole basis for treatment. Nasal washings and aspirates are unacceptable for Xpert Xpress SARS-CoV-2/FLU/RSV testing.  Fact Sheet for Patients: bloggercourse.com  Fact Sheet for Healthcare Providers: seriousbroker.it  This test is not yet approved or cleared by the [place]  FDA and has been authorized for detection and/or  diagnosis of SARS-CoV-2 by FDA under an Emergency Use Authorization (EUA). This EUA will remain in effect (meaning this test can be used) for the duration of the COVID-19 declaration under Section 564(b)(1) of the Act, 21 U.S.C. section 360bbb-3(b)(1), unless the authorization is terminated or revoked.     Resp Syncytial Virus by PCR NEGATIVE NEGATIVE Final    Comment: (NOTE) Fact Sheet for Patients: bloggercourse.com  Fact Sheet for Healthcare Providers: seriousbroker.it  This test is not yet approved or cleared by the [place]  FDA and has been authorized for detection and/or diagnosis of SARS-CoV-2 by FDA under an Emergency Use Authorization (EUA). This EUA will remain in effect (meaning this test can be used) for the duration of the COVID-19 declaration under Section 564(b)(1) of the Act, 21 U.S.C. section 360bbb-3(b)(1), unless the authorization is terminated or revoked.  Performed at [facility], [address], [place], [place] [address]   Culture, blood (Routine X 2) w Reflex to ID Panel     Status: None (Preliminary result)   Collection Time: 01/11/24 12:09 PM   Specimen: BLOOD  Result Value Ref Range Status   Specimen Description BLOOD RIGHT ANTECUBITAL  Final   Special Requests   Final    BOTTLES DRAWN AEROBIC AND ANAEROBIC Blood Culture adequate volume   Culture   Final    NO GROWTH < 24 HOURS Performed at [facility], [address], [place], [place] [address]    Report Status PENDING  Incomplete  Culture, blood (Routine X 2) w Reflex to ID Panel     Status: None (Preliminary result)   Collection Time: 01/11/24 12:17 PM   Specimen: BLOOD  Result Value Ref Range Status   Specimen Description BLOOD BLOOD LEFT WRIST  Final   Special Requests   Final    BOTTLES DRAWN AEROBIC AND ANAEROBIC Blood Culture adequate volume   Culture   Final    NO GROWTH < 24 HOURS Performed at [facility], [address],  [place], [place] [address]    Report Status PENDING  Incomplete  MRSA Next Gen by PCR, Nasal     Status: None   Collection Time: 01/11/24 12:39 PM   Specimen: Nasal Mucosa; Nasal Swab  Result Value Ref Range Status   MRSA by PCR Next Gen NOT DETECTED NOT DETECTED Final    Comment: (NOTE) The GeneXpert MRSA Assay (FDA approved for NASAL specimens only), is one component of a comprehensive MRSA colonization surveillance program. It is not intended to diagnose MRSA infection nor to guide or monitor treatment for MRSA infections. Test performance is not FDA approved in patients less than 46 years old.  Performed at [facility], [address] [place], [place] [address]          Radiology Studies: ECHOCARDIOGRAM LIMITED Result Date: 01/11/2024    ECHOCARDIOGRAM LIMITED REPORT   Patient Name:   DEMARCUS THIELKE Date of Exam: 01/11/2024 Medical Rec #:  [id number]       Height:       68.0 in Accession #:    [id number]      Weight:       176.6 lb Date of Birth:  Nov 11, 1940       BSA:          1.938 m Patient Age:    83 years        BP:           96/41 mmHg Patient Gender: M               HR:           95 bpm. Exam Location:  [facility] Procedure: Limited Echo, Limited Color Doppler, Cardiac Doppler and PEDOF (Both            Spectral and Color Flow Doppler were utilized during procedure). Indications:    TIA G45.9  History:        Patient has prior history of Echocardiogram examinations, most                 recent 12/16/2023. TIA and DVT, GERD.  Sonographer:    Koleen Popper RDCS Referring Phys: [id number] OLADAPO ADEFESO IMPRESSIONS  1. Left ventricular ejection fraction, by estimation, is 65 to 70%. The left ventricle has normal function. The left ventricle has no regional wall motion abnormalities. Left ventricular diastolic parameters are consistent with Grade I diastolic dysfunction (impaired relaxation).  2. The mitral valve is normal in structure. No evidence of mitral valve regurgitation. No evidence  of mitral stenosis.  3. The aortic valve was not well visualized. Aortic valve regurgitation is trivial. Moderate aortic valve stenosis. Aortic valve area, by VTI measures 1.33 cm. Aortic valve mean gradient measures 25.0 mmHg. Aortic valve Vmax measures 3.40 m/s. Aortic valve acceleration time measures 102 msec.  4. Tricuspid regurgitation signal is inadequate for assessing PA pressure. Comparison(s): No significant change from prior study. FINDINGS  Left Ventricle: Left ventricular ejection fraction, by estimation, is 65 to 70%. The left ventricle has normal function. The left ventricle has no regional wall motion abnormalities. The left ventricular internal cavity size was normal in size. There is  no left ventricular hypertrophy. Left ventricular diastolic parameters are consistent with Grade I diastolic dysfunction (impaired relaxation). Right Ventricle: Tricuspid regurgitation signal is inadequate for assessing PA pressure. Left Atrium: Left atrial size was normal in size. Right Atrium: Right atrial size was normal in size. Mitral Valve: The mitral valve is normal in structure. No evidence of mitral valve stenosis. Tricuspid Valve: The tricuspid valve is normal in structure. Tricuspid valve regurgitation is not demonstrated. No evidence of tricuspid stenosis. Aortic Valve: The aortic valve was not well visualized. Aortic valve regurgitation is trivial. Moderate aortic stenosis is present. Aortic valve mean gradient measures 25.0 mmHg. Aortic valve peak gradient measures 46.2 mmHg. Aortic valve area, by VTI measures 1.33 cm. Pulmonic Valve: The pulmonic valve was not well visualized. Pulmonic valve regurgitation is not visualized. No evidence of pulmonic stenosis. IAS/Shunts: No atrial level shunt detected by color flow Doppler. Additional Comments: Spectral Doppler performed. Color Doppler performed.  LEFT VENTRICLE PLAX 2D LVIDd:  4.50 cm      Diastology LVIDs:         2.50 cm      LV e' medial:     6.64 cm/s LV PW:         1.00 cm      LV E/e' medial:  11.0 LV IVS:        1.20 cm      LV e' lateral:   7.07 cm/s LVOT diam:     1.90 cm      LV E/e' lateral: 10.4 LV SV:         70 LV SV Index:   36 LVOT Area:     2.84 cm  LV Volumes (MOD) LV vol d, MOD A4C: 110.0 ml LV vol s, MOD A4C: 46.3 ml LV SV MOD A4C:     110.0 ml RIGHT VENTRICLE             IVC RV S prime:     17.10 cm/s  IVC diam: 2.70 cm LEFT ATRIUM             Index LA diam:        3.60 cm 1.86 cm/m LA Vol (A2C):   31.0 ml 15.99 ml/m LA Vol (A4C):   33.7 ml 17.39 ml/m LA Biplane Vol: 33.0 ml 17.02 ml/m  AORTIC VALVE AV Area (Vmax):    1.37 cm AV Area (Vmean):   1.31 cm AV Area (VTI):     1.33 cm AV Vmax:           340.00 cm/s AV Vmean:          224.500 cm/s AV VTI:            0.527 m AV Peak Grad:      46.2 mmHg AV Mean Grad:      25.0 mmHg LVOT Vmax:         164.00 cm/s LVOT Vmean:        104.000 cm/s LVOT VTI:          0.247 m LVOT/AV VTI ratio: 0.47  AORTA Ao Root diam: 3.20 cm MITRAL VALVE MV Area (PHT): 4.06 cm     SHUNTS MV Decel Time: 187 msec     Systemic VTI:  0.25 m MV E velocity: 73.30 cm/s   Systemic Diam: 1.90 cm MV A velocity: 112.00 cm/s MV E/A ratio:  0.65 Stanly Leavens MD Electronically signed by Stanly Leavens MD Signature Date/Time: 01/11/2024/2:52:09 PM    Final    DG CHEST PORT 1 VIEW Result Date: 01/11/2024 CLINICAL DATA:  Dyspnea and respiratory abnormalities EXAM: PORTABLE CHEST - 1 VIEW COMPARISON:  01/10/2024 FINDINGS: Cardiomediastinal silhouette and pulmonary vasculature are within normal limits. Patchy opacity seen at the LEFT lung base have mildly increased since prior examination. There is minimal blunting of both lateral costophrenic angles. IMPRESSION: 1. Patchy LEFT lung base airspace opacities are suspicious for developing pneumonia. 2. Minimal blunting of BILATERAL costophrenic angles suspicious for trace pleural effusions. Electronically Signed   By: Aliene Lloyd M.D.   On: 01/11/2024 13:55    DG Chest 2 View Result Date: 01/10/2024 EXAM: 2 VIEW(S) XRAY OF THE CHEST 01/10/2024 06:27:00 PM COMPARISON: 08/13/2023 CLINICAL HISTORY: AMS FINDINGS: LUNGS AND PLEURA: Linear opacity at left base consistent with scarring or atelectasis. No pleural effusion. No pneumothorax. HEART AND MEDIASTINUM: Calcified aorta. BONES AND SOFT TISSUES: No acute osseous abnormality. IMPRESSION: 1. No acute cardiopulmonary abnormality. Electronically signed by: Morgane Naveau MD 01/10/2024 08:30  PM EST RP Workstation: [id number]   MR BRAIN WO CONTRAST Result Date: 01/10/2024 EXAM: MRI BRAIN WITHOUT CONTRAST 01/10/2024 05:40:24 PM TECHNIQUE: Multiplanar multisequence MRI of the head/brain was performed without the administration of intravenous contrast. COMPARISON: CT head earlier today. CLINICAL HISTORY: Recent TIA, AMS, ataxic FINDINGS: BRAIN AND VENTRICLES: No acute infarct. No intracranial hemorrhage. No mass. No midline shift. No hydrocephalus. The sella is unremarkable. Normal flow voids. ORBITS: No acute abnormality. SINUSES AND MASTOIDS: No acute abnormality. BONES AND SOFT TISSUES: Normal marrow signal. No acute soft tissue abnormality. IMPRESSION: 1. No acute intracranial abnormality. Electronically signed by: Gilmore Molt 01/10/2024 07:05 PM EST RP Workstation: [id number]   CT Cervical Spine Wo Contrast Result Date: 01/10/2024 EXAM: CT CERVICAL SPINE WITHOUT CONTRAST 01/10/2024 03:34:09 PM TECHNIQUE: CT of the cervical spine was performed without the administration of intravenous contrast. Multiplanar reformatted images are provided for review. Automated exposure control, iterative reconstruction, and/or weight based adjustment of the mA/kV was utilized to reduce the radiation dose to as low as reasonably achievable. COMPARISON: 01/02/2018 CLINICAL HISTORY: Neck trauma (Age >= 65y) FINDINGS: BONES AND ALIGNMENT: Straightening of the normal cervical lordosis. Trace degenerative retrolisthesis of C3 on C4.  Additional trace degenerative anterolisthesis of C7 on T1. No evidence of traumatic malalignment. No acute fracture. DEGENERATIVE CHANGES: Disc space narrowing and degenerative endplate osteophytes at multiple levels. Degenerative endplate changes greatest at C6-C7. Facet arthrosis and uncovertebral hypertrophy at multiple levels. No high grade osseous spinal canal stenosis. SOFT TISSUES: No prevertebral soft tissue swelling. IMPRESSION: 1. No evidence of acute traumatic injury. Electronically signed by: Donnice Mania MD 01/10/2024 03:48 PM EST RP Workstation: [id number]   CT Head Wo Contrast Result Date: 01/10/2024 EXAM: CT HEAD WITHOUT CONTRAST 01/10/2024 03:34:09 PM TECHNIQUE: CT of the head was performed without the administration of intravenous contrast. Automated exposure control, iterative reconstruction, and/or weight based adjustment of the mA/kV was utilized to reduce the radiation dose to as low as reasonably achievable. COMPARISON: 12/15/2023 CLINICAL HISTORY: Head trauma, minor (Age >= 65y); Mental status change, unknown cause. FINDINGS: BRAIN AND VENTRICLES: No acute hemorrhage. No evidence of acute infarct. No hydrocephalus. No extra-axial collection. No mass effect or midline shift. 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 Age-related cerebral volume loss. Atherosclerosis of the carotid siphons and intracranial vertebral arteries. There is tortuosity of the basilar artery without evidence of dilatation. ORBITS: Bilateral lens replacement. SINUSES: No acute abnormality. SOFT TISSUES AND SKULL: No acute soft tissue abnormality. No skull fracture. IMPRESSION: 1. No acute intracranial abnormality. Electronically signed by: Donnice Mania MD 01/10/2024 03:43 PM EST RP Workstation: [id number]        Scheduled Meds:   stroke: early stages of recovery book   Does not apply Once   aspirin   81 mg Oral Daily   atorvastatin   20 mg Oral Daily   Chlorhexidine   Gluconate Cloth  6 each Topical Q0600   clopidogrel   75 mg Oral Daily   enoxaparin  (LOVENOX ) injection  40 mg Subcutaneous Q24H   finasteride   5 mg Oral Daily   mirabegron  ER  50 mg Oral Daily   pantoprazole   80 mg Oral Q1200   sucralfate   1 g Oral TID AC & HS     LOS: 1 day    Time spent: 55 minutes    Alsie Younes JONETTA Fairly, DO [facility]  If 7PM-7AM, please contact night-coverage [email or web address] 01/12/2024, 10:54 AM   "

## 2024-01-12 NOTE — Evaluation (Signed)
 Clinical/Bedside Swallow Evaluation Patient Details  Name: David Mooney MRN: [id number] Date of Birth: 10/05/1940  Today's Date: 01/12/2024 Time: SLP Start Time (ACUTE ONLY): 1558 SLP Stop Time (ACUTE ONLY): 1610 SLP Time Calculation (min) (ACUTE ONLY): 12 min  Past Medical History:  Past Medical History:  Diagnosis Date   Arthritis    Cataract    Complication of anesthesia    Embolus (HCC) 12/2022   left Hip   GERD (gastroesophageal reflux disease)    Glaucoma    Hernia of unspecified site of abdominal cavity without mention of obstruction or gangrene    History of gastric ulcer    History of kidney stones    PONV (postoperative nausea and vomiting)    Ulcerative colitis    Past Surgical History:  Past Surgical History:  Procedure Laterality Date   BIOPSY  12/04/2018   Procedure: BIOPSY;  Surgeon: Golda Claudis PENNER, MD;  [facility];  Service: Endoscopy;;  cecum   COLONOSCOPY     COLONOSCOPY N/A 08/27/2012   Procedure: COLONOSCOPY;  Surgeon: Claudis PENNER Golda, MD;  [facility];  Service: Endoscopy;  Laterality: N/A;  1200-moved to 13:15 Ann to notify pt   COLONOSCOPY N/A 12/04/2018   Procedure: COLONOSCOPY;  Surgeon: Golda Claudis PENNER, MD;  [facility];  Service: Endoscopy;  Laterality: N/A;  155pm   COLONOSCOPY N/A 10/01/2023   Procedure: COLONOSCOPY;  Surgeon: Eartha Angelia Sieving, MD;  [facility];  Service: Gastroenterology;  Laterality: N/A;  8:15 am, asa 3   CYSTOSCOPY WITH INSERTION OF UROLIFT N/A 04/30/2022   Procedure: CYSTOSCOPY WITH INSERTION OF UROLIFT;  Surgeon: Sherrilee Belvie CROME, MD;  Location: [facility];  Service: Urology;  Laterality: N/A;   ESOPHAGOGASTRODUODENOSCOPY N/A 07/09/2023   Procedure: EGD (ESOPHAGOGASTRODUODENOSCOPY);  Surgeon: Eartha Angelia, Sieving, MD;  [facility];  Service: Gastroenterology;  Laterality: N/A;  7:30AM;ASA 3   ESOPHAGOGASTRODUODENOSCOPY (EGD) WITH PROPOFOL   01/04/2023    Procedure: ESOPHAGOGASTRODUODENOSCOPY (EGD) WITH PROPOFOL ;  Surgeon: Cindie Carlin POUR, DO;  [facility];  Service: Endoscopy;;   ESOPHAGOGASTRODUODENOSCOPY (EGD) WITH PROPOFOL  N/A 04/09/2023   Procedure: ESOPHAGOGASTRODUODENOSCOPY (EGD) WITH PROPOFOL ;  Surgeon: Eartha Angelia Sieving, MD;  [facility];  Service: Gastroenterology;  Laterality: N/A;  7:30am;asa 3   HERNIA REPAIR Left    MOLE REMOVAL     From head , chest   UPPER GASTROINTESTINAL ENDOSCOPY     HPI:  [name] is an 83 y.o. male with medical history significant of BPH, duodenal ulcer, ulcerative colitis, DVT, recent TIA (12/1) who presents to the emergency department due to altered mental status and multiple falls overnight.  He was admitted with concerns of TIA with patient last known being well at 9 PM last night.  Brain MRI negative. Patient now noted to have fever as well as tachypnea and tachycardia and now appears to have pneumonia on chest x-ray and has been started on Zosyn  due to concerns for aspiration.  Currently on dysphagia 3 diet with SLP evaluation ordered.  PT recommending SNF on discharge. BSE requested  MBSS 02/20/2023 <<Pt's oropharyngeal swallowing was noted to be within functional limits on today's assessment. Pt was assessed with thin, NTL, HTL, puree and regular textures. Pt demonstrates timely oral prep stage with a cohesive bolus followed by a timely swallow, adequate pharyngeal stripping wave, good hyolaryngeal excursion and good airway protection. Note occasional trace to min pharyngeal residue after the swallow that is cleared by a  reflexive repeat swallow. Barium tablet assessed with thin liquids and note brief stasis of the tablet in the cervical esophagus but easily passed through with one additional presentation of puree. Radiologist present to confirm. Pt was seen by Dr. Henreitta yesterday 02/19/23 who recommended slowly progressing to soft textures. (Pt has been on puree since  December d/t esophageal issues). SLP reviewed findings of MBSS and reinforced esophageal precautions and provided texture suggestions to slowly introduce soft textures per GI recommendation; reviewed with Pt's wife and daughter. There are no further ST needs noted at this time. Thank you for this referral, >>   Assessment / Plan / Recommendation  Clinical Impression  Clinical swallowing evaluation completed while Pt was sitting upright in bed with daughter and wife seated at bedside for BSE. Note MBSS detailed above completed February of 2025 - swallowing was noted to be [facility] at that time. Pt consumed all textures and consistencies presented this date without overt s/sx of aspiration. Pt was pleasant and cooperative. Based on communication with daughter and wife and previous recommendation for D3 on MBSS recommend D3/mech soft diet and thin liquids. Recommend continue to administer meds whole with liquids. There are no further ST needs for dysphagia at this time.  Also note request for SLE - Pt was very lethargic and requested to rest after completing BSE, therefore SLE will be completed at a later date if indicated at that time. Thank you,  SLP Visit Diagnosis: Dysphagia, unspecified (R13.10)    Aspiration Risk  Mild aspiration risk    Diet Recommendation    D3/mech soft and thin liquids       Other Recommendations Oral Care Recommendations: Oral care BID     Swallow Evaluation Recommendations Recommendations: PO diet PO Diet Recommendation: Dysphagia 3 (Mechanical soft);Thin liquids (Level 0) Liquid Administration via: Cup;Straw Medication Administration: Whole meds with liquid Supervision: Patient able to self-feed;Staff to assist with self-feeding;Full assist for feeding Swallowing strategies  : Slow rate;Small bites/sips Postural changes: Position pt fully upright for meals;Stay upright 30-60 min after meals Oral care recommendations: Oral care BID (2x/day)     Swallow Study    General HPI: [name] is an 83 y.o. male with medical history significant of BPH, duodenal ulcer, ulcerative colitis, DVT, recent TIA (12/1) who presents to the emergency department due to altered mental status and multiple falls overnight.  He was admitted with concerns of TIA with patient last known being well at 9 PM last night.  Brain MRI negative. Patient now noted to have fever as well as tachypnea and tachycardia and now appears to have pneumonia on chest x-ray and has been started on Zosyn  due to concerns for aspiration.  Currently on dysphagia 3 diet with SLP evaluation ordered.  PT recommending SNF on discharge. Type of Study: Bedside Swallow Evaluation Previous Swallow Assessment: MBSS 02/2023 Diet Prior to this Study: Dysphagia 3 (mechanical soft);Thin liquids (Level 0) Temperature Spikes Noted: No Respiratory Status: Nasal cannula History of Recent Intubation: No Behavior/Cognition: Alert;Cooperative;Pleasant mood Oral Cavity Assessment: Within Functional Limits Oral Care Completed by SLP: Recent completion by staff Oral Cavity - Dentition: Adequate natural dentition Vision: Functional for self-feeding Self-Feeding Abilities: Able to feed self;Able to feed self with adaptive devices;Needs assist Patient Positioning: Upright in bed Baseline Vocal Quality: Normal Volitional Cough: Strong Volitional Swallow: Able to elicit    Oral/Motor/Sensory Function Overall Oral Motor/Sensory Function: Within functional limits   Ice Chips Ice chips: Within functional limits   Thin Liquid Thin Liquid: Within functional  limits    Nectar Thick Nectar Thick Liquid: Not tested   Honey Thick Honey Thick Liquid: Not tested   Puree Puree: Within functional limits   Solid     Solid: Within functional limits     Tandrea Kommer H. Clois KILLIAN, CCC-SLP Speech Language Pathologist  Raguel VEAR Clois 01/12/2024,4:10 PM

## 2024-01-12 NOTE — NC FL2 (Signed)
 " [place]  MEDICAID [id number] LEVEL OF CARE FORM     IDENTIFICATION  Patient Name: David Mooney Birthdate: November 16, 1940 Sex: male Admission Date (Current Location): 01/10/2024  [facility] and [place] Number:  David Mooney and Address:  [facility],  [address] David Mooney      Provider Number: [id number]  Attending Physician Name and Address:  Maree Adron BIRCH, DO  Relative Name and Phone Number:  David Mooney: [phone]    Current Level of Care: Hospital Recommended Level of Care: Skilled Nursing Facility Prior Approval Number:    Date Approved/Denied:   PASRR Number: [id number]  Discharge Plan: SNF    Current Diagnoses: Patient Active Problem List   Diagnosis Date Noted   Mild cognitive impairment 12/17/2023   CVA (cerebral vascular accident) (HCC) 12/16/2023   Right hemiparesis (HCC) 12/16/2023   TIA (transient ischemic attack) 12/16/2023   Crohn's ileocolitis (HCC) 09/12/2023   Peptic ulcer disease 08/15/2023   Small bowel obstruction (HCC) 08/15/2023   Right sided abdominal pain 08/14/2023   Crohn's disease of small intestine (HCC) 08/14/2023   SBO (small bowel obstruction) (HCC) 08/13/2023   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 07/09/2023   Acute esophagitis 04/09/2023   Duodenal ulcer 02/18/2023   Acute deep vein thrombosis (DVT) of femoral vein of left lower extremity (HCC) 01/11/2023   Esophagitis, unspecified with bleeding 01/04/2023   Abnormal CT of the abdomen 01/02/2023   Kidney stones 12/11/2022   Nocturia 03/28/2020   Urgency of urination 03/28/2020   Benign prostatic hyperplasia with urinary obstruction 03/28/2020   Anemia 10/22/2018   Fall 01/02/2018   GERD (gastroesophageal reflux disease) 09/15/2013   Flatulence 09/15/2013   Ulcerative colitis (HCC) 07/24/2011   Glaucoma 07/24/2011    Orientation RESPIRATION BLADDER Height & Weight     Self  Normal Indwelling catheter,  Incontinent (External Catheter) Weight: 80.1 kg Height:  5' 8 (172.7 cm)  BEHAVIORAL SYMPTOMS/MOOD NEUROLOGICAL BOWEL NUTRITION STATUS      Continent Diet  AMBULATORY STATUS COMMUNICATION OF NEEDS Skin   Limited Assist Verbally Other (Comment) (Redness bilateral Buttocks)                       Personal Care Assistance Level of Assistance  Bathing, Feeding, Dressing Bathing Assistance: Limited assistance Feeding assistance: Limited assistance Dressing Assistance: Limited assistance     Functional Limitations Info  Sight Sight Info: Impaired        SPECIAL CARE FACTORS FREQUENCY  PT (By licensed PT), OT (By licensed OT), Speech therapy     PT Frequency: 5 x /week OT Frequency: 3 x / week     Speech Therapy Frequency: As Ordered      Contractures Contractures Info: Not present    Additional Factors Info                  Current Medications (01/12/2024):  This is the current hospital active medication list Current Facility-Administered Medications  Medication Dose Route Frequency Provider Last Rate Last Admin    stroke: early stages of recovery book   Does not apply Once Adefeso, Oladapo, DO       acetaminophen  (TYLENOL ) tablet 650 mg  650 mg Oral Q6H PRN Adefeso, Oladapo, DO   650 mg at 01/11/24 2000   Or   acetaminophen  (TYLENOL ) suppository 650 mg  650 mg Rectal Q6H PRN Adefeso, Oladapo, DO       aspirin  chewable tablet 81 mg  81 mg Oral Daily Adefeso, Oladapo, DO   81 mg at 01/12/24 1025   atorvastatin  (LIPITOR) tablet 20 mg  20 mg Oral Daily Adefeso, Oladapo, DO   20 mg at 01/12/24 1025   Chlorhexidine  Gluconate Cloth 2 % PADS 6 each  6 each Topical Q0600 Adefeso, Oladapo, DO   6 each at 01/12/24 0550   clopidogrel  (PLAVIX ) tablet 75 mg  75 mg Oral Daily Adefeso, Oladapo, DO   75 mg at 01/12/24 1025   enoxaparin  (LOVENOX ) injection 40 mg  40 mg Subcutaneous Q24H Adefeso, Oladapo, DO   40 mg at 01/12/24 1026   finasteride  (PROSCAR ) tablet 5 mg  5 mg Oral  Daily Adefeso, Oladapo, DO   5 mg at 01/12/24 1025   mirabegron  ER (MYRBETRIQ ) tablet 50 mg  50 mg Oral Daily Adefeso, Oladapo, DO   50 mg at 01/12/24 1025   ondansetron  (ZOFRAN ) tablet 4 mg  4 mg Oral Q6H PRN Adefeso, Oladapo, DO       Or   ondansetron  (ZOFRAN ) injection 4 mg  4 mg Intravenous Q6H PRN Adefeso, Oladapo, DO       pantoprazole  (PROTONIX ) EC tablet 80 mg  80 mg Oral Q1200 Adefeso, Oladapo, DO   80 mg at 01/11/24 1159   piperacillin -tazobactam (ZOSYN ) IVPB 3.375 g  3.375 g Intravenous Q8H Tanda Dempsey SAUNDERS, RPH 12.5 mL/hr at 01/12/24 9357 Infusion Verify at 01/12/24 9357   sucralfate  (CARAFATE ) 1 GM/10ML suspension 1 g  1 g Oral TID AC & HS Adefeso, Oladapo, DO   1 g at 01/12/24 9166   vancomycin  (VANCOREADY) IVPB 1750 mg/350 mL  1,750 mg Intravenous Q24H Tanda Dempsey SAUNDERS, [facility]   Stopped at 01/11/24 1850     Discharge Medications: Please see discharge summary for a list of discharge medications.  Relevant Imaging Results:  Relevant Lab Results:   Additional Information    David MARLA Sil, RN     "

## 2024-01-13 ENCOUNTER — Inpatient Hospital Stay (HOSPITAL_COMMUNITY)

## 2024-01-13 ENCOUNTER — Other Ambulatory Visit: Payer: Self-pay

## 2024-01-13 ENCOUNTER — Inpatient Hospital Stay (HOSPITAL_COMMUNITY)
Admit: 2024-01-13 | Discharge: 2024-01-13 | Disposition: A | Discharge status: Home / Self Care | Attending: Internal Medicine | Admitting: Internal Medicine

## 2024-01-13 DIAGNOSIS — G3184 Mild cognitive impairment, so stated: Secondary | ICD-10-CM | POA: Diagnosis not present

## 2024-01-13 DIAGNOSIS — J690 Pneumonitis due to inhalation of food and vomit: Secondary | ICD-10-CM | POA: Insufficient documentation

## 2024-01-13 DIAGNOSIS — G9341 Metabolic encephalopathy: Secondary | ICD-10-CM

## 2024-01-13 DIAGNOSIS — R569 Unspecified convulsions: Secondary | ICD-10-CM

## 2024-01-13 DIAGNOSIS — R4182 Altered mental status, unspecified: Secondary | ICD-10-CM

## 2024-01-13 LAB — BASIC METABOLIC PANEL WITH GFR
Anion gap: 9 (ref 5–15)
BUN: 14 mg/dL (ref 8–23)
CO2: 24 mmol/L (ref 22–32)
Calcium: 9.2 mg/dL (ref 8.9–10.3)
Chloride: 104 mmol/L (ref 98–111)
Creatinine, Ser: 0.99 mg/dL (ref 0.61–1.24)
GFR, Estimated: >60 mL/min
Glucose, Bld: 113 mg/dL — ABNORMAL HIGH (ref 70–99)
Potassium: 3.5 mmol/L (ref 3.5–5.1)
Sodium: 137 mmol/L (ref 135–145)

## 2024-01-13 LAB — URINALYSIS, W/ REFLEX TO CULTURE (INFECTION SUSPECTED)
Bacteria, UA: NONE SEEN
Bilirubin Urine: NEGATIVE
Glucose, UA: NEGATIVE mg/dL
Ketones, ur: NEGATIVE mg/dL
Nitrite: NEGATIVE
Protein, ur: NEGATIVE mg/dL
Specific Gravity, Urine: 1.016 (ref 1.005–1.030)
pH: 5 (ref 5.0–8.0)

## 2024-01-13 LAB — PROCALCITONIN: Procalcitonin: <0.1 ng/mL

## 2024-01-13 LAB — CBC
HCT: 30.3 % — ABNORMAL LOW (ref 39.0–52.0)
Hemoglobin: 9.9 g/dL — ABNORMAL LOW (ref 13.0–17.0)
MCH: 29.3 pg (ref 26.0–34.0)
MCHC: 32.7 g/dL (ref 30.0–36.0)
MCV: 89.6 fL (ref 80.0–100.0)
Platelets: 165 K/uL (ref 150–400)
RBC: 3.38 MIL/uL — ABNORMAL LOW (ref 4.22–5.81)
RDW: 13.1 % (ref 11.5–15.5)
WBC: 8.1 K/uL (ref 4.0–10.5)
nRBC: 0 % (ref 0.0–0.2)

## 2024-01-13 LAB — RESPIRATORY PANEL BY PCR

## 2024-01-13 LAB — VITAMIN B12: Vitamin B-12: 585 pg/mL (ref 180–914)

## 2024-01-13 LAB — MAGNESIUM: Magnesium: 2.2 mg/dL (ref 1.7–2.4)

## 2024-01-13 LAB — FOLATE: Folate: >20 ng/mL

## 2024-01-13 LAB — TSH: TSH: 2.94 u[IU]/mL (ref 0.350–4.500)

## 2024-01-13 LAB — T4, FREE: Free T4: 1.1 ng/dL (ref 0.80–2.00)

## 2024-01-13 MED ORDER — POLYETHYLENE GLYCOL 3350 17 G PO PACK
17.0000 g | PACK | Freq: Every day | ORAL | Status: DC
  Administered 2024-01-13: 17 g via ORAL
  Filled 2024-01-13 (×2): qty 1

## 2024-01-13 MED ORDER — DOCUSATE SODIUM 100 MG PO CAPS
100.0000 mg | ORAL_CAPSULE | Freq: Every day | ORAL | Status: DC | PRN
  Administered 2024-01-13: 100 mg via ORAL
  Filled 2024-01-13: qty 1

## 2024-01-13 MED ORDER — AZITHROMYCIN 250 MG PO TABS
500.0000 mg | ORAL_TABLET | Freq: Every day | ORAL | Status: DC
  Administered 2024-01-13 – 2024-01-15 (×3): 500 mg via ORAL
  Filled 2024-01-13 (×3): qty 2

## 2024-01-13 NOTE — Evaluation (Signed)
 Occupational Therapy Evaluation Patient Details Name: David Mooney MRN: [id number] DOB: [birth year] Today's Date: 01/13/2024   History of Present Illness   David Mooney is an 83 y.o. male with medical history significant of BPH, duodenal ulcer, ulcerative colitis, DVT, recent TIA (12/1) who presents to the emergency department due to altered mental status.  At bedside, patient was unable to provide details regarding why he came to the ED, history was obtained from ED PA and ED medical record.  Per report, patient last known well was 9 PM last night, he has had multiple falls when he gets up to use the restroom at night due to balancing issues whereby he tends to lean to the right.  Patient also has needed assistance in getting up from sitting position, has had some urinary incontinence (changed from baseline).  No report of vomiting, diarrhea, fever or chills. (per DO)     Clinical Impressions Pt agreeable to OT evaluation. Pt was alert and oriented today. Min A needed for bed mobility with labored effort. Mod A for sit to stand followed by min to mod A to chair with RW. Pt also ambulated a short distance forward and backward in the room with more min A using the RW. Pt demonstrates need of set up to CGA for most seated ADL's. More assist needed if the pt attempts standing due to balance deficits, including posterior lean. WFL B UE mobility with mild weakness noted. Pt left in the chair with call bell within reach and  hospital staff present. Pt will benefit from continued OT in the hospital to increase strength, balance, and endurance for safe ADL's.        If plan is discharge home, recommend the following:   A lot of help with walking and/or transfers;A little help with bathing/dressing/bathroom;Assistance with cooking/housework;Assist for transportation;Help with stairs or ramp for entrance     Functional Status Assessment   Patient has had a recent decline in their functional  status and demonstrates the ability to make significant improvements in function in a reasonable and predictable amount of time.     Equipment Recommendations   None recommended by OT            Precautions/Restrictions   Precautions Precautions: Fall Recall of Precautions/Restrictions: Impaired Restrictions Weight Bearing Restrictions Per Provider Order: No     Mobility Bed Mobility Overal bed mobility: Needs Assistance Bed Mobility: Supine to Sit     Supine to sit: Min assist     General bed mobility comments: Assist for trunk control.    Transfers Overall transfer level: Needs assistance Equipment used: Rolling walker (2 wheels) Transfers: Sit to/from Stand, Bed to chair/wheelchair/BSC Sit to Stand: Mod assist     Step pivot transfers: Mod assist, Min assist     General transfer comment: EOB to chair with RW      Balance Overall balance assessment: Needs assistance Sitting-balance support: No upper extremity supported, Feet supported Sitting balance-Leahy Scale: Fair Sitting balance - Comments: seated at EOB   Standing balance support: Bilateral upper extremity supported, During functional activity, Reliant on assistive device for balance Standing balance-Leahy Scale: Poor Standing balance comment: using RW; posterior lean initially                           ADL either performed or assessed with clinical judgement   ADL Overall ADL's : Needs assistance/impaired     Grooming: Set up;Sitting   Upper  Body Bathing: Set up;Sitting;Contact guard assist   Lower Body Bathing: Contact guard assist;Sitting/lateral leans;Minimal assistance   Upper Body Dressing : Set up;Sitting   Lower Body Dressing: Set up;Contact guard assist;Sitting/lateral leans   Toilet Transfer: Moderate assistance;Minimal assistance;Stand-pivot;Ambulation;Rolling walker (2 wheels) Toilet Transfer Details (indicate cue type and reason): EOB to chair with RW followed  by short distance ambulation in the room with RW. Toileting- Clothing Manipulation and Hygiene: Minimal assistance;Contact guard assist;Sitting/lateral lean       Functional mobility during ADLs: Minimal assistance;Rolling walker (2 wheels) General ADL Comments: Able to functionally ambulate a total of ~16 feet in the room going forward and backward from the chair twice with the RW and mostly min A.     Vision Baseline Vision/History: 1 Wears glasses Ability to See in Adequate Light: 1 Impaired Vision Assessment?: No apparent visual deficits     Perception Perception: Not tested       Praxis Praxis: Not tested       Pertinent Vitals/Pain Pain Assessment Pain Assessment: No/denies pain     Extremity/Trunk Assessment Upper Extremity Assessment Upper Extremity Assessment: Generalized weakness;Overall [facility] for tasks assessed   Lower Extremity Assessment Lower Extremity Assessment: Defer to PT evaluation   Cervical / Trunk Assessment Cervical / Trunk Assessment: Normal   Communication Communication Communication: No apparent difficulties   Cognition Arousal: Alert Behavior During Therapy: WFL for tasks assessed/performed Cognition: History of cognitive impairments             OT - Cognition Comments: Chart indicates mild cognitive impairment.                 Following commands: Intact       Cueing  General Comments   Cueing Techniques: Verbal cues;Visual cues;Tactile cues                 Home Living Family/patient expects to be discharged to:: Private residence Living Arrangements: Spouse/significant other Available Help at Discharge: Family;Available PRN/intermittently Type of Home: House Home Access: Stairs to enter [employer] of Steps: 2-3 Entrance Stairs-Rails: None Home Layout: One level     Bathroom Shower/Tub: Producer, Television/film/video: Standard Bathroom Accessibility: Yes How Accessible: Accessible via  wheelchair;Accessible via walker Home Equipment: Rolling Walker (2 wheels);Shower seat   Additional Comments: per chart      Prior Functioning/Environment Prior Level of Function : Independent/Modified Independent;Driving;History of Falls (last six months)             Mobility Comments: Typically community ambulator without AD. ADLs Comments: Independent    OT Problem List: Decreased strength;Impaired balance (sitting and/or standing)   OT Treatment/Interventions: Self-care/ADL training;Therapeutic exercise;DME and/or AE instruction;Therapeutic activities;Patient/family education;Balance training      OT Goals(Current goals can be found in the care plan section)   Acute Rehab OT Goals Patient Stated Goal: Improve function. OT Goal Formulation: With patient Time For Goal Achievement: 01/27/24 Potential to Achieve Goals: Good   OT Frequency:  Min 2X/week                                   End of Session Equipment Utilized During Treatment: Rolling walker (2 wheels);Gait belt Nurse Communication: Mobility status  Activity Tolerance: Patient tolerated treatment well Patient left: in chair;with call bell/phone within reach;Other (comment) (Other hospital staff present and prepping to do a procedure with the patient.)  OT Visit Diagnosis: Unsteadiness on feet (R26.81);Other abnormalities  of gait and mobility (R26.89);Muscle weakness (generalized) (M62.81);Other symptoms and signs involving cognitive function                Time: 8547-8492 OT Time Calculation (min): 15 min Charges:  OT General Charges $OT Visit: 1 Visit OT Evaluation $OT Eval Low Complexity: 1 Low  David Mooney OT, MOT  David Mooney 01/13/2024, 3:52 PM

## 2024-01-13 NOTE — Plan of Care (Signed)
" °  Problem: Acute Rehab OT Goals (only OT should resolve) Goal: Pt. Will Perform Grooming Flowsheets (Taken 01/13/2024 1557) Pt Will Perform Grooming:  with modified independence  standing Goal: Pt. Will Perform Lower Body Bathing Flowsheets (Taken 01/13/2024 1557) Pt Will Perform Lower Body Bathing: with modified independence Goal: Pt. Will Perform Lower Body Dressing Flowsheets (Taken 01/13/2024 1557) Pt Will Perform Lower Body Dressing: with modified independence Goal: Pt. Will Transfer To Toilet Flowsheets (Taken 01/13/2024 1557) Pt Will Transfer to Toilet:  with modified independence  ambulating Goal: Pt. Will Perform Toileting-Clothing Manipulation Flowsheets (Taken 01/13/2024 1557) Pt Will Perform Toileting - Clothing Manipulation and hygiene: with modified independence Goal: Pt/Caregiver Will Perform Home Exercise Program Flowsheets (Taken 01/13/2024 1557) Pt/caregiver will Perform Home Exercise Program:  Increased strength  Both right and left upper extremity  Independently  Ezell Poke OT, MOT  "

## 2024-01-13 NOTE — Progress Notes (Signed)
 EEG complete - results pending

## 2024-01-13 NOTE — Progress Notes (Signed)
 "          PROGRESS NOTE  David Mooney FMW:[id number] DOB: [birth year] DOA: 01/10/2024 PCP: Lari Elspeth BRAVO, MD  Brief History56  83 year old male with a history of BPH, ulcerative colitis, DVT (01/07/23--finished Eliquis course 08/03/2023), recent TIA 12/16/23, duodenal ulcer presenting with altered mental status and multiple falls.  Apparently there was concern for another neurologic event because the patient was  falling to the right.  However the patient has had generalized weakness and unsteady gait.  MRI of the brain was negative for any acute findings.  PT recommending skilled nursing facility.  However, the patient began developing fevers on 01/11/2024.  Blood cultures were obtained.  There was concern for aspiration pneumonia.  He was started on Zosyn .  He has continued to have low-grade fevers in the early morning of 01/13/2024.  Lactic acid 1.1>> 1.2.   Assessment/Plan: Acute metabolic encephalopathy -Secondary to infectious process - B12 - TSH - Folic acid  - 01/10/2024 UA 0-5 WBC - MRI brain negative for acute findings -EEG  Aspiration pneumonia - Personally reviewed chest x-ray--bilateral patchy infiltrates R>L - Continue Zosyn  - MRSA negative - Discontinue vancomycin  - Speech therapy evaluation appreciated>> dysphagia 3 diet - CT chest  Fever - COVID-19/RSV/flu--negative - Viral respiratory panel  BPH - Continue Myrbetriq  and finasteride    Mixed hyperlipidemia - Continue statin   GERD/duodenal ulcer - Continue Carafate  - Continue PPI   Ulcerative colitis - Patient is on Tremfya  q. 28 days - No longer taking mesalamine    Hypokalemia -repleted   Mild Cognitive Impairment -family endorses that pt has been lost while driving and occasionally slow to respond -outpt referral for neuropsych eval      Family Communication:   wife at bedside 12/29  Consultants:  none  Code Status:  FULL   DVT Prophylaxis:  [place] Heparin /   Lovenox    Procedures: As Listed in Progress Note Above  Antibiotics: Vanc 12/27>>12/29 Zosyn  12/27>>        Subjective: Patient denies fevers, chills, headache, chest pain, dyspnea, nausea, vomiting, diarrhea, abdominal pain, dysuria, hematuria, hematochezia, and melena.   Objective: Vitals:   01/13/24 0350 01/13/24 0527 01/13/24 0600 01/13/24 0620  BP:  (!) 103/50 (!) 97/48   Pulse:  83 74   Resp:  20 (!) 21   Temp: (!) 100.7 F (38.2 C) 100.2 F (37.9 C)  98.1 F (36.7 C)  TempSrc: Oral Oral  Oral  SpO2:  94% 95%   Weight:      Height:        Intake/Output Summary (Last 24 hours) at 01/13/2024 0740 Last data filed at 01/13/2024 0350 Gross per 24 hour  Intake 705.88 ml  Output 850 ml  Net -144.12 ml   Weight change:  Exam:  General:  Pt is alert, follows commands appropriately, not in acute distress HEENT: No icterus, No thrush, No neck mass, [place]/AT Cardiovascular: RRR, S1/S2, no rubs, no gallops Respiratory: bibasilar rales.  No wheeze Abdomen: Soft/+BS, non tender, non distended, no guarding Extremities: No edema, No lymphangitis, No petechiae, No rashes, no synovitis Neuro:  CN II-XII intact, strength 4/5 in RUE, RLE, strength 4/5 LUE, LLE; sensation intact bilateral; no dysmetria; babinski equivocal    Data Reviewed: I have personally reviewed following labs and imaging studies Basic Metabolic Panel: Recent Labs  Lab 01/10/24 1445 01/11/24 0450 01/12/24 0540 01/13/24 0320  NA 140 139 138 137  K 4.0 3.7 3.5 3.5  CL 104 105 104 104  CO2 23  27 25 24   GLUCOSE 113* 116* 119* 113*  BUN 12 10 13 14   CREATININE 0.93 0.79 0.96 0.99  CALCIUM  9.9 9.4 9.4 9.2  MG  --  2.1 2.2 2.2  PHOS  --  2.5  --   --    Liver Function Tests: Recent Labs  Lab 01/10/24 1445 01/11/24 0450  AST 30 26  ALT 19 17  ALKPHOS 49 44  BILITOT 0.6 0.6  PROT 7.0 6.0*  ALBUMIN 4.3 3.6   No results for input(s): LIPASE, AMYLASE in the last 168 hours. No results  for input(s): AMMONIA in the last 168 hours. Coagulation Profile: No results for input(s): INR, PROTIME in the last 168 hours. CBC: Recent Labs  Lab 01/10/24 1445 01/11/24 0450 01/12/24 0540 01/13/24 0320  WBC 10.9* 8.4 8.9 8.1  HGB 12.0* 10.4* 10.8* 9.9*  HCT 35.7* 31.0* 32.7* 30.3*  MCV 89.0 89.3 88.1 89.6  PLT 150 137* 153 165   Cardiac Enzymes: No results for input(s): CKTOTAL, CKMB, CKMBINDEX, TROPONINI in the last 168 hours. BNP: Invalid input(s): POCBNP CBG: Recent Labs  Lab 01/10/24 1445  GLUCAP 113*   HbA1C: No results for input(s): HGBA1C in the last 72 hours. Urine analysis:    Component Value Date/Time   COLORURINE YELLOW 01/10/2024 1648   APPEARANCEUR CLEAR 01/10/2024 1648   APPEARANCEUR Clear 10/23/2023 1024   LABSPEC 1.015 01/10/2024 1648   PHURINE 6.0 01/10/2024 1648   GLUCOSEU NEGATIVE 01/10/2024 1648   HGBUR MODERATE (A) 01/10/2024 1648   BILIRUBINUR NEGATIVE 01/10/2024 1648   BILIRUBINUR Negative 10/23/2023 1024   KETONESUR NEGATIVE 01/10/2024 1648   PROTEINUR 30 (A) 01/10/2024 1648   NITRITE NEGATIVE 01/10/2024 1648   LEUKOCYTESUR NEGATIVE 01/10/2024 1648   Sepsis Labs: @LABRCNTIP (procalcitonin:4,lacticidven:4) ) Recent Results (from the past 240 hours)  Resp panel by RT-PCR (RSV, Flu A&B, Covid) Anterior Nasal Swab     Status: None   Collection Time: 01/10/24  2:25 PM   Specimen: Anterior Nasal Swab  Result Value Ref Range Status   SARS Coronavirus 2 by RT PCR NEGATIVE NEGATIVE Final    Comment: (NOTE) SARS-CoV-2 target nucleic acids are NOT DETECTED.  The SARS-CoV-2 RNA is generally detectable in upper respiratory specimens during the acute phase of infection. The lowest concentration of SARS-CoV-2 viral copies this assay can detect is 138 copies/mL. A negative result does not preclude SARS-Cov-2 infection and should not be used as the sole basis for treatment or other patient management decisions. A negative result  may occur with  improper specimen collection/handling, submission of specimen other than nasopharyngeal swab, presence of viral mutation(s) within the areas targeted by this assay, and inadequate number of viral copies(<138 copies/mL). A negative result must be combined with clinical observations, patient history, and epidemiological information. The expected result is Negative.  Fact Sheet for Patients:  bloggercourse.com  Fact Sheet for Healthcare Providers:  seriousbroker.it  This test is no t yet approved or cleared by the [place]  FDA and  has been authorized for detection and/or diagnosis of SARS-CoV-2 by FDA under an Emergency Use Authorization (EUA). This EUA will remain  in effect (meaning this test can be used) for the duration of the COVID-19 declaration under Section 564(b)(1) of the Act, 21 U.S.C.section 360bbb-3(b)(1), unless the authorization is terminated  or revoked sooner.       Influenza A by PCR NEGATIVE NEGATIVE Final   Influenza B by PCR NEGATIVE NEGATIVE Final    Comment: (NOTE) The Xpert Xpress SARS-CoV-2/FLU/RSV plus assay  is intended as an aid in the diagnosis of influenza from Nasopharyngeal swab specimens and should not be used as a sole basis for treatment. Nasal washings and aspirates are unacceptable for Xpert Xpress SARS-CoV-2/FLU/RSV testing.  Fact Sheet for Patients: bloggercourse.com  Fact Sheet for Healthcare Providers: seriousbroker.it  This test is not yet approved or cleared by the [place]  FDA and has been authorized for detection and/or diagnosis of SARS-CoV-2 by FDA under an Emergency Use Authorization (EUA). This EUA will remain in effect (meaning this test can be used) for the duration of the COVID-19 declaration under Section 564(b)(1) of the Act, 21 U.S.C. section 360bbb-3(b)(1), unless the authorization is terminated  or revoked.     Resp Syncytial Virus by PCR NEGATIVE NEGATIVE Final    Comment: (NOTE) Fact Sheet for Patients: bloggercourse.com  Fact Sheet for Healthcare Providers: seriousbroker.it  This test is not yet approved or cleared by the [place]  FDA and has been authorized for detection and/or diagnosis of SARS-CoV-2 by FDA under an Emergency Use Authorization (EUA). This EUA will remain in effect (meaning this test can be used) for the duration of the COVID-19 declaration under Section 564(b)(1) of the Act, 21 U.S.C. section 360bbb-3(b)(1), unless the authorization is terminated or revoked.  Performed at [facility], [address] [place], [place] [address]   Culture, blood (Routine X 2) w Reflex to ID Panel     Status: None (Preliminary result)   Collection Time: 01/11/24 12:09 PM   Specimen: BLOOD  Result Value Ref Range Status   Specimen Description BLOOD RIGHT ANTECUBITAL  Final   Special Requests   Final    BOTTLES DRAWN AEROBIC AND ANAEROBIC Blood Culture adequate volume   Culture   Final    NO GROWTH 2 DAYS Performed at [facility], [address] [place], [place] [address]    Report Status PENDING  Incomplete  Culture, blood (Routine X 2) w Reflex to ID Panel     Status: None (Preliminary result)   Collection Time: 01/11/24 12:17 PM   Specimen: BLOOD  Result Value Ref Range Status   Specimen Description BLOOD BLOOD LEFT WRIST  Final   Special Requests   Final    BOTTLES DRAWN AEROBIC AND ANAEROBIC Blood Culture adequate volume   Culture   Final    NO GROWTH 2 DAYS Performed at [facility], [address], [place], [place] [address]    Report Status PENDING  Incomplete  MRSA Next Gen by PCR, Nasal     Status: None   Collection Time: 01/11/24 12:39 PM   Specimen: Nasal Mucosa; Nasal Swab  Result Value Ref Range Status   MRSA by PCR Next Gen NOT DETECTED NOT DETECTED Final    Comment: (NOTE) The GeneXpert  MRSA Assay (FDA approved for NASAL specimens only), is one component of a comprehensive MRSA colonization surveillance program. It is not intended to diagnose MRSA infection nor to guide or monitor treatment for MRSA infections. Test performance is not FDA approved in patients less than 23 years old. Performed at [facility], [address] [place], [place] [address]      Scheduled Meds:   stroke: early stages of recovery book   Does not apply Once   aspirin   81 mg Oral Daily   atorvastatin   20 mg Oral Daily   Chlorhexidine  Gluconate Cloth  6 each Topical Q0600   clopidogrel   75 mg Oral Daily   enoxaparin  (LOVENOX ) injection  40 mg Subcutaneous Q24H  finasteride   5 mg Oral Daily   Latanoprostene Bunod   1 drop Left Eye QHS   mirabegron  ER  50 mg Oral Daily   pantoprazole   80 mg Oral Q1200   sucralfate   1 g Oral TID AC & HS   Continuous Infusions:  piperacillin -tazobactam (ZOSYN )  IV 3.375 g (01/13/24 0525)   vancomycin  1,750 mg (01/12/24 1652)    Procedures/Studies: ECHOCARDIOGRAM LIMITED Result Date: 01/11/2024    ECHOCARDIOGRAM LIMITED REPORT   Patient Name:   DEMETRI GOSHERT Date of Exam: 01/11/2024 Medical Rec #:  [id number]       Height:       68.0 in Accession #:    [id number]      Weight:       176.6 lb Date of Birth:  30-Dec-1940       BSA:          1.938 m Patient Age:    83 years        BP:           96/41 mmHg Patient Gender: M               HR:           95 bpm. Exam Location:  [facility] Procedure: Limited Echo, Limited Color Doppler, Cardiac Doppler and PEDOF (Both            Spectral and Color Flow Doppler were utilized during procedure). Indications:    TIA G45.9  History:        Patient has prior history of Echocardiogram examinations, most                 recent 12/16/2023. TIA and DVT, GERD.  Sonographer:    Koleen Popper RDCS Referring Phys: [id number] OLADAPO ADEFESO IMPRESSIONS  1. Left ventricular ejection fraction, by estimation, is 65 to 70%. The left ventricle has  normal function. The left ventricle has no regional wall motion abnormalities. Left ventricular diastolic parameters are consistent with Grade I diastolic dysfunction (impaired relaxation).  2. The mitral valve is normal in structure. No evidence of mitral valve regurgitation. No evidence of mitral stenosis.  3. The aortic valve was not well visualized. Aortic valve regurgitation is trivial. Moderate aortic valve stenosis. Aortic valve area, by VTI measures 1.33 cm. Aortic valve mean gradient measures 25.0 mmHg. Aortic valve Vmax measures 3.40 m/s. Aortic valve acceleration time measures 102 msec.  4. Tricuspid regurgitation signal is inadequate for assessing PA pressure. Comparison(s): No significant change from prior study. FINDINGS  Left Ventricle: Left ventricular ejection fraction, by estimation, is 65 to 70%. The left ventricle has normal function. The left ventricle has no regional wall motion abnormalities. The left ventricular internal cavity size was normal in size. There is  no left ventricular hypertrophy. Left ventricular diastolic parameters are consistent with Grade I diastolic dysfunction (impaired relaxation). Right Ventricle: Tricuspid regurgitation signal is inadequate for assessing PA pressure. Left Atrium: Left atrial size was normal in size. Right Atrium: Right atrial size was normal in size. Mitral Valve: The mitral valve is normal in structure. No evidence of mitral valve stenosis. Tricuspid Valve: The tricuspid valve is normal in structure. Tricuspid valve regurgitation is not demonstrated. No evidence of tricuspid stenosis. Aortic Valve: The aortic valve was not well visualized. Aortic valve regurgitation is trivial. Moderate aortic stenosis is present. Aortic valve mean gradient measures 25.0 mmHg. Aortic valve peak gradient measures 46.2 mmHg. Aortic valve area, by VTI measures 1.33 cm. Pulmonic Valve: The pulmonic  valve was not well visualized. Pulmonic valve regurgitation is not  visualized. No evidence of pulmonic stenosis. IAS/Shunts: No atrial level shunt detected by color flow Doppler. Additional Comments: Spectral Doppler performed. Color Doppler performed.  LEFT VENTRICLE PLAX 2D LVIDd:         4.50 cm      Diastology LVIDs:         2.50 cm      LV e' medial:    6.64 cm/s LV PW:         1.00 cm      LV E/e' medial:  11.0 LV IVS:        1.20 cm      LV e' lateral:   7.07 cm/s LVOT diam:     1.90 cm      LV E/e' lateral: 10.4 LV SV:         70 LV SV Index:   36 LVOT Area:     2.84 cm  LV Volumes (MOD) LV vol d, MOD A4C: 110.0 ml LV vol s, MOD A4C: 46.3 ml LV SV MOD A4C:     110.0 ml RIGHT VENTRICLE             IVC RV S prime:     17.10 cm/s  IVC diam: 2.70 cm LEFT ATRIUM             Index LA diam:        3.60 cm 1.86 cm/m LA Vol (A2C):   31.0 ml 15.99 ml/m LA Vol (A4C):   33.7 ml 17.39 ml/m LA Biplane Vol: 33.0 ml 17.02 ml/m  AORTIC VALVE AV Area (Vmax):    1.37 cm AV Area (Vmean):   1.31 cm AV Area (VTI):     1.33 cm AV Vmax:           340.00 cm/s AV Vmean:          224.500 cm/s AV VTI:            0.527 m AV Peak Grad:      46.2 mmHg AV Mean Grad:      25.0 mmHg LVOT Vmax:         164.00 cm/s LVOT Vmean:        104.000 cm/s LVOT VTI:          0.247 m LVOT/AV VTI ratio: 0.47  AORTA Ao Root diam: 3.20 cm MITRAL VALVE MV Area (PHT): 4.06 cm     SHUNTS MV Decel Time: 187 msec     Systemic VTI:  0.25 m MV E velocity: 73.30 cm/s   Systemic Diam: 1.90 cm MV A velocity: 112.00 cm/s MV E/A ratio:  0.65 Stanly Leavens MD Electronically signed by Stanly Leavens MD Signature Date/Time: 01/11/2024/2:52:09 PM    Final    DG CHEST PORT 1 VIEW Result Date: 01/11/2024 CLINICAL DATA:  Dyspnea and respiratory abnormalities EXAM: PORTABLE CHEST - 1 VIEW COMPARISON:  01/10/2024 FINDINGS: Cardiomediastinal silhouette and pulmonary vasculature are within normal limits. Patchy opacity seen at the LEFT lung base have mildly increased since prior examination. There is minimal blunting of  both lateral costophrenic angles. IMPRESSION: 1. Patchy LEFT lung base airspace opacities are suspicious for developing pneumonia. 2. Minimal blunting of BILATERAL costophrenic angles suspicious for trace pleural effusions. Electronically Signed   By: Aliene Lloyd M.D.   On: 01/11/2024 13:55   DG Chest 2 View Result Date: 01/10/2024 EXAM: 2 VIEW(S) XRAY OF THE CHEST 01/10/2024 06:27:00 PM COMPARISON: 08/13/2023 CLINICAL  HISTORY: AMS FINDINGS: LUNGS AND PLEURA: Linear opacity at left base consistent with scarring or atelectasis. No pleural effusion. No pneumothorax. HEART AND MEDIASTINUM: Calcified aorta. BONES AND SOFT TISSUES: No acute osseous abnormality. IMPRESSION: 1. No acute cardiopulmonary abnormality. Electronically signed by: Morgane Naveau MD 01/10/2024 08:30 PM EST RP Workstation: [id number]   MR BRAIN WO CONTRAST Result Date: 01/10/2024 EXAM: MRI BRAIN WITHOUT CONTRAST 01/10/2024 05:40:24 PM TECHNIQUE: Multiplanar multisequence MRI of the head/brain was performed without the administration of intravenous contrast. COMPARISON: CT head earlier today. CLINICAL HISTORY: Recent TIA, AMS, ataxic FINDINGS: BRAIN AND VENTRICLES: No acute infarct. No intracranial hemorrhage. No mass. No midline shift. No hydrocephalus. The sella is unremarkable. Normal flow voids. ORBITS: No acute abnormality. SINUSES AND MASTOIDS: No acute abnormality. BONES AND SOFT TISSUES: Normal marrow signal. No acute soft tissue abnormality. IMPRESSION: 1. No acute intracranial abnormality. Electronically signed by: Gilmore Molt 01/10/2024 07:05 PM EST RP Workstation: [id number]   CT Cervical Spine Wo Contrast Result Date: 01/10/2024 EXAM: CT CERVICAL SPINE WITHOUT CONTRAST 01/10/2024 03:34:09 PM TECHNIQUE: CT of the cervical spine was performed without the administration of intravenous contrast. Multiplanar reformatted images are provided for review. Automated exposure control, iterative reconstruction, and/or weight based  adjustment of the mA/kV was utilized to reduce the radiation dose to as low as reasonably achievable. COMPARISON: 01/02/2018 CLINICAL HISTORY: Neck trauma (Age >= 65y) FINDINGS: BONES AND ALIGNMENT: Straightening of the normal cervical lordosis. Trace degenerative retrolisthesis of C3 on C4. Additional trace degenerative anterolisthesis of C7 on T1. No evidence of traumatic malalignment. No acute fracture. DEGENERATIVE CHANGES: Disc space narrowing and degenerative endplate osteophytes at multiple levels. Degenerative endplate changes greatest at C6-C7. Facet arthrosis and uncovertebral hypertrophy at multiple levels. No high grade osseous spinal canal stenosis. SOFT TISSUES: No prevertebral soft tissue swelling. IMPRESSION: 1. No evidence of acute traumatic injury. Electronically signed by: Donnice Mania MD 01/10/2024 03:48 PM EST RP Workstation: [id number]   CT Head Wo Contrast Result Date: 01/10/2024 EXAM: CT HEAD WITHOUT CONTRAST 01/10/2024 03:34:09 PM TECHNIQUE: CT of the head was performed without the administration of intravenous contrast. Automated exposure control, iterative reconstruction, and/or weight based adjustment of the mA/kV was utilized to reduce the radiation dose to as low as reasonably achievable. COMPARISON: 12/15/2023 CLINICAL HISTORY: Head trauma, minor (Age >= 65y); Mental status change, unknown cause. FINDINGS: BRAIN AND VENTRICLES: No acute hemorrhage. No evidence of acute infarct. No hydrocephalus. No extra-axial collection. No mass effect or midline shift. 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 Age-related cerebral volume loss. Atherosclerosis of the carotid siphons and intracranial vertebral arteries. There is tortuosity of the basilar artery without evidence of dilatation. ORBITS: Bilateral lens replacement. SINUSES: No acute abnormality. SOFT TISSUES AND SKULL: No acute soft tissue abnormality. No skull fracture. IMPRESSION: 1. No acute  intracranial abnormality. Electronically signed by: Donnice Mania MD 01/10/2024 03:43 PM EST RP Workstation: [id number]   EEG adult Result Date: 12/16/2023 Gregg Lek, MD     12/16/2023  4:34 PM Patient Name: DAXTON NYDAM MRN: [id number] Epilepsy Attending: Lek Gregg Referring Physician/Provider: Sallyann Normie HERO, MD     Date: 12/16/2023 Duration: 22 minutes Patient history: 82 year old man found in his car in the parking lot with weakness. EEG to evaluate for seizure. Level of alertness: Awake, drowsy AEDs during EEG study: None Technical aspects: This EEG study was done with scalp electrodes positioned according to the 10-20 International system of electrode placement. Electrical activity was reviewed with band pass filter of  1-70Hz , sensitivity of 7 uV/mm, display speed of 21mm/sec with a 60Hz  notched filter applied as appropriate. EEG data were recorded continuously and digitally stored.  Video monitoring was available and reviewed as appropriate. Description: The posterior dominant rhythm consists of 8-9 Hz activity of moderate voltage (25-35 uV) seen predominantly in posterior head regions, symmetric and reactive to eye opening and eye closing. Drowsiness was characterized by attenuation of the posterior background rhythm. Sleep was not seen.  There was additional period of excessive amount of 15 to 18 Hz, 2-3 uV beta activity distributed symmetrically and diffusely. Hyperventilation and photic stimulation were not performed.   ABNORMALITY - Excessive beta, generalized IMPRESSION: This study is within normal limits. No seizures or epileptiform discharges were seen throughout the recording. The excessive beta activity seen in the background is most likely due to the effect of benzodiazepine and is a benign EEG pattern. A normal interictal EEG does not exclude nor support the diagnosis of epilepsy. Pastor Falling MD Neurology    ECHOCARDIOGRAM COMPLETE Result Date: 12/16/2023    ECHOCARDIOGRAM REPORT    Patient Name:   CHRISTPHOR GROFT Date of Exam: 12/16/2023 Medical Rec #:  [id number]       Height:       66.0 in Accession #:    [id number]      Weight:       170.0 lb Date of Birth:  05-30-1940       BSA:          1.866 m Patient Age:    83 years        BP:           92/51 mmHg Patient Gender: M               HR:           76 bpm. Exam Location:  [facility] Procedure: 2D Echo, Cardiac Doppler and Color Doppler (Both Spectral and Color            Flow Doppler were utilized during procedure). Indications:    Stroke l63.9  History:        Patient has no prior history of Echocardiogram examinations.                 TIA, Stroke and DVT.  Sonographer:    Aida Pizza RCS Referring Phys: [id number] OLADAPO ADEFESO IMPRESSIONS  1. Left ventricular ejection fraction, by estimation, is 65 to 70%. The left ventricle has normal function. The left ventricle has no regional wall motion abnormalities. There is mild asymmetric left ventricular hypertrophy of the basal segment. Left ventricular diastolic parameters are consistent with Grade I diastolic dysfunction (impaired relaxation).  2. Right ventricular systolic function is normal. The right ventricular size is normal. Tricuspid regurgitation signal is inadequate for assessing PA pressure.  3. The mitral valve is degenerative. Trivial mitral valve regurgitation.  4. The aortic valve is abnormal. There is moderate calcification of the aortic valve. Aortic valve regurgitation is trivial. Moderate aortic valve stenosis. Aortic valve mean gradient measures 21.0 mmHg. Dimentionless index 0.46.  5. The inferior vena cava is normal in size with greater than 50% respiratory variability, suggesting right atrial pressure of 3 mmHg. Comparison(s): No prior Echocardiogram. FINDINGS  Left Ventricle: Left ventricular ejection fraction, by estimation, is 65 to 70%. The left ventricle has normal function. The left ventricle has no regional wall motion abnormalities. The left ventricular internal  cavity size was normal in size. There is  mild asymmetric left  ventricular hypertrophy of the basal segment. Left ventricular diastolic parameters are consistent with Grade I diastolic dysfunction (impaired relaxation). Right Ventricle: The right ventricular size is normal. No increase in right ventricular wall thickness. Right ventricular systolic function is normal. Tricuspid regurgitation signal is inadequate for assessing PA pressure. Left Atrium: Left atrial size was normal in size. Right Atrium: Right atrial size was normal in size. Pericardium: There is no evidence of pericardial effusion. Presence of epicardial fat layer. Mitral Valve: The mitral valve is degenerative in appearance. Mild mitral annular calcification. Trivial mitral valve regurgitation. Tricuspid Valve: The tricuspid valve is grossly normal. Tricuspid valve regurgitation is trivial. Aortic Valve: The aortic valve is abnormal. There is moderate calcification of the aortic valve. There is mild aortic valve annular calcification. Aortic valve regurgitation is trivial. Aortic regurgitation PHT measures 562 msec. Moderate aortic stenosis  is present. Aortic valve mean gradient measures 21.0 mmHg. Aortic valve peak gradient measures 39.0 mmHg. Aortic valve area, by VTI measures 1.17 cm. Pulmonic Valve: The pulmonic valve was grossly normal. Pulmonic valve regurgitation is trivial. Aorta: The aortic root is normal in size and structure. Venous: The inferior vena cava is normal in size with greater than 50% respiratory variability, suggesting right atrial pressure of 3 mmHg. IAS/Shunts: No atrial level shunt detected by color flow Doppler. Additional Comments: 3D was performed not requiring image post processing on an independent workstation and was indeterminate.  LEFT VENTRICLE PLAX 2D LVIDd:         4.40 cm   Diastology LVIDs:         3.00 cm   LV e' medial:    6.84 cm/s LV PW:         1.00 cm   LV E/e' medial:  13.8 LV IVS:        1.10 cm   LV  e' lateral:   7.71 cm/s LVOT diam:     1.80 cm   LV E/e' lateral: 12.2 LV SV:         82 LV SV Index:   44 LVOT Area:     2.54 cm  RIGHT VENTRICLE RV S prime:     15.30 cm/s TAPSE (M-mode): 2.1 cm LEFT ATRIUM             Index        RIGHT ATRIUM           Index LA diam:        3.50 cm 1.88 cm/m   RA Area:     14.00 cm LA Vol (A2C):   51.9 ml 27.81 ml/m  RA Volume:   31.40 ml  16.82 ml/m LA Vol (A4C):   39.6 ml 21.22 ml/m LA Biplane Vol: 45.5 ml 24.38 ml/m  AORTIC VALVE AV Area (Vmax):    0.87 cm AV Area (Vmean):   0.90 cm AV Area (VTI):     1.17 cm AV Vmax:           312.33 cm/s AV Vmean:          211.000 cm/s AV VTI:            0.701 m AV Peak Grad:      39.0 mmHg AV Mean Grad:      21.0 mmHg LVOT Vmax:         107.00 cm/s LVOT Vmean:        74.800 cm/s LVOT VTI:          0.323 m LVOT/AV VTI ratio: 0.46  AI PHT:            562 msec  AORTA Ao Root diam: 3.50 cm MITRAL VALVE MV Area (PHT): 2.80 cm     SHUNTS MV Decel Time: 271 msec     Systemic VTI:  0.32 m MV E velocity: 94.30 cm/s   Systemic Diam: 1.80 cm MV A velocity: 121.00 cm/s MV E/A ratio:  0.78 Jayson Sierras MD Electronically signed by Jayson Sierras MD Signature Date/Time: 12/16/2023/2:51:34 PM    Final    MR BRAIN WO CONTRAST Result Date: 12/16/2023 EXAM: MRI BRAIN WITHOUT CONTRAST 12/16/2023 11:40 AM TECHNIQUE: Multiplanar multisequence MRI of the head/brain was performed without the administration of intravenous contrast. COMPARISON: CT head 12/15/2023. CLINICAL HISTORY: Neuro deficit, acute, stroke suspected. Acute onset of right sided weakness and slurred speech. FINDINGS: BRAIN AND VENTRICLES: No acute infarct, mass, midline shift, hydrocephalus, or extraaxial fluid collection is identified. Chronic microhemorrhages are noted in the right thalamus and posterior right temporal lobe. T2 hyperintensities in the cerebral white matter bilaterally are nonspecific but compatible with mild chronic small vessel ischemic disease. There is mild  cerebral atrophy. Major intracranial vascular flow voids are preserved. ORBITS: No acute abnormality. SINUSES AND MASTOIDS: No acute abnormality. BONES AND SOFT TISSUES: Normal marrow signal. No acute soft tissue abnormality. Bilateral cataract extraction. IMPRESSION: 1. No acute intracranial abnormality. 2. Mild chronic small vessel ischemic disease. Electronically signed by: Dasie Hamburg MD 12/16/2023 12:11 PM EST RP Workstation: [id number]   CT ANGIO HEAD NECK W WO CM Result Date: 12/16/2023 CLINICAL DATA:  Initial evaluation for acute neuro deficit, stroke. EXAM: CT ANGIOGRAPHY HEAD AND NECK WITH AND WITHOUT CONTRAST TECHNIQUE: Multidetector CT imaging of the head and neck was performed using the standard protocol during bolus administration of intravenous contrast. Multiplanar CT image reconstructions and MIPs were obtained to evaluate the vascular anatomy. Carotid stenosis measurements (when applicable) are obtained utilizing NASCET criteria, using the distal internal carotid diameter as the denominator. RADIATION DOSE REDUCTION: This exam was performed according to the departmental dose-optimization program which includes automated exposure control, adjustment of the mA and/or kV according to patient size and/or use of iterative reconstruction technique. CONTRAST:  75mL OMNIPAQUE  IOHEXOL  350 MG/ML SOLN COMPARISON:  CT from 12/15/2023. FINDINGS: CTA NECK FINDINGS Aortic arch: Visualized aortic arch within normal limits for caliber with standard 3 vessel morphology. Aortic atherosclerosis. No significant stenosis about the origin the great vessels. Right carotid system: Right common and internal carotid arteries are patent without dissection. Mild for age atheromatous change about the right carotid bulb without stenosis. Left carotid system: Left common and internal carotid arteries are patent without dissection. Mild for age atheromatous change about the left carotid bulb without stenosis. Vertebral  arteries: Both vertebral arteries arise from subclavian arteries. No significant proximal subclavian artery stenosis vertebral arteries are patent without stenosis or dissection. Skeleton: No worrisome osseous lesions. Moderate to advanced multilevel cervical spondylosis, most pronounced at C6-7. Other neck: No other acute finding. Upper chest: No other acute finding. Review of the MIP images confirms the above findings CTA HEAD FINDINGS Anterior circulation: Atheromatous change about the carotid siphons without hemodynamically significant stenosis. A1 segments patent bilaterally. Normal anterior communicating artery complex. Atheromatous irregularity about the ACAs without proximal high-grade stenosis. Atheromatous irregularity about the M1 segments bilaterally with associated short-segment mild stenosis within the mid left M1 segment (series 12, image 21). No proximal MCA branch occlusion or high-grade stenosis. Distal MCA branches perfused and symmetric. Diffuse small vessel atheromatous irregularity noted. Posterior  circulation: Vertebrobasilar dolichoectasia noted. Atheromatous irregularity about the ectatic V4 segments without high-grade stenosis. Neither PICA origin well visualized. Basilar is irregular and ectatic but patent without stenosis. Superior cerebral arteries patent bilaterally. Both PCAs primarily supplied via the basilar. Atheromatous irregularity about the PCAs without proximal high-grade stenosis. Venous sinuses: Patent allowing for timing the contrast bolus. Anatomic variants: None significant.  No aneurysm. Review of the MIP images confirms the above findings IMPRESSION: 1. Negative CTA for large vessel occlusion or other emergent finding. 2. Tortuosity of the major arterial vasculature of the head and neck with intracranial arterial dolichoectasia, suggesting chronic underlying hypertension. 3. Scattered atheromatous disease about the carotid bifurcations, carotid siphons, and intracranial  circulation, but no hemodynamically significant or correctable stenosis. Aortic Atherosclerosis (ICD10-I70.0). Electronically Signed   By: Morene Hoard M.D.   On: 12/16/2023 02:05   CT HEAD WO CONTRAST Result Date: 12/15/2023 EXAM: CT HEAD WITHOUT 12/15/2023 10:48:44 PM TECHNIQUE: CT of the head was performed without the administration of intravenous contrast. Automated exposure control, iterative reconstruction, and/or weight based adjustment of the mA/kV was utilized to reduce the radiation dose to as low as reasonably achievable. COMPARISON: 12 / 19 / 19 CLINICAL HISTORY: Neuro deficit, acute, stroke suspected FINDINGS: BRAIN AND VENTRICLES: No acute intracranial hemorrhage. No mass effect or midline shift. No extra-axial fluid collection. No evidence of acute infarct. Scattered and confluent periventricular and subcortical white matter hypodense lesions, likely sequela of chronic microvascular ischemic change. Prominence of ventricles and sulci reflecting age-related volume loss. Calcified atherosclerotic plaque in cavernous/supraclinoid Internal Carotid Artery (ICA) and intradural vertebral arteries. ORBITS: Bilateral lens replacement noted. SINUSES AND MASTOIDS: No acute abnormality. SOFT TISSUES AND SKULL: No acute skull fracture. No acute soft tissue abnormality. IMPRESSION: 1. No acute intracranial abnormality. Electronically signed by: Norman Gatlin MD 12/15/2023 10:58 PM EST RP Workstation: [id number]    Alm Schneider, DO  [facility]  If 7PM-7AM, please contact night-coverage [email or web address] Password [email or web address] 01/13/2024, 7:40 AM   LOS: 2 days   "

## 2024-01-13 NOTE — Hospital Course (Signed)
 83 year old male with a history of BPH, ulcerative colitis, DVT (01/07/23--finished Eliquis course 08/03/2023), recent TIA 12/16/23, duodenal ulcer presenting with altered mental status and multiple falls.  Apparently there was concern for another neurologic event because the patient was  falling to the right.  However the patient has had generalized weakness and unsteady gait.  MRI of the brain was negative for any acute findings.  PT recommending skilled nursing facility.  However, the patient began developing fevers on 01/11/2024.  Blood cultures were obtained.  There was concern for aspiration pneumonia.  He was started on Zosyn .  He has continued to have low-grade fevers in the early morning of 01/13/2024.  Lactic acid 1.1>> 1.2. Patient's mental status has improved back to baseline.

## 2024-01-13 NOTE — Plan of Care (Signed)
" °  Problem: Education: Goal: Knowledge of disease or condition will improve Outcome: Progressing Goal: Knowledge of secondary prevention will improve (MUST DOCUMENT ALL) Outcome: Progressing Goal: Knowledge of patient specific risk factors will improve (DELETE if not current risk factor) Outcome: Progressing   Problem: Ischemic Stroke/TIA Tissue Perfusion: Goal: Complications of ischemic stroke/TIA will be minimized Outcome: Progressing   Problem: Coping: Goal: Will verbalize positive feelings about self Outcome: Progressing Goal: Will identify appropriate support needs Outcome: Progressing   Problem: Health Behavior/Discharge Planning: Goal: Goals will be collaboratively established with patient/family Outcome: Progressing   Problem: Self-Care: Goal: Ability to communicate needs accurately will improve Outcome: Progressing   Problem: Nutrition: Goal: Risk of aspiration will decrease Outcome: Progressing Goal: Dietary intake will improve Outcome: Progressing   Problem: Education: Goal: Knowledge of General Education information will improve Description: Including pain rating scale, medication(s)/side effects and non-pharmacologic comfort measures Outcome: Progressing   Problem: Nutrition: Goal: Adequate nutrition will be maintained Outcome: Progressing   Problem: Health Behavior/Discharge Planning: Goal: Ability to manage health-related needs will improve Outcome: Not Progressing   Problem: Activity: Goal: Risk for activity intolerance will decrease Outcome: Not Progressing   "

## 2024-01-13 NOTE — Procedures (Signed)
 Patient Name: ZAYYAN MULLEN  MRN: [id number]  Epilepsy Attending: Arlin MALVA Krebs  Referring Physician/Provider: Evonnie Lenis, MD  Date: 01/13/2024 Duration: 23.32 mins  Patient history: 83yo M with ams. EEG to evaluate for seizure  Level of alertness: Awake  AEDs during EEG study: None  Technical aspects: This EEG study was done with scalp electrodes positioned according to the 10-20 International system of electrode placement. Electrical activity was reviewed with band pass filter of 1-70Hz , sensitivity of 7 uV/mm, display speed of 77mm/sec with a 60Hz  notched filter applied as appropriate. EEG data were recorded continuously and digitally stored.  Video monitoring was available and reviewed as appropriate.  Description: The posterior dominant rhythm consists of 8-9 Hz activity of moderate voltage (25-35 uV) seen predominantly in posterior head regions, symmetric and reactive to eye opening and eye closing. EEG showed intermittent generalized 3 to 6 Hz theta-delta slowing. Hyperventilation and photic stimulation were not performed.     ABNORMALITY - Intermittent slow, generalized  IMPRESSION: This study is suggestive of mild diffuse encephalopathy. No seizures or epileptiform discharges were seen throughout the recording.  Sunaina Ferrando O [place] Hollar

## 2024-01-14 DIAGNOSIS — J690 Pneumonitis due to inhalation of food and vomit: Secondary | ICD-10-CM | POA: Diagnosis not present

## 2024-01-14 DIAGNOSIS — G9341 Metabolic encephalopathy: Secondary | ICD-10-CM | POA: Diagnosis not present

## 2024-01-14 LAB — BASIC METABOLIC PANEL WITH GFR
Anion gap: 6 (ref 5–15)
BUN: 15 mg/dL (ref 8–23)
CO2: 29 mmol/L (ref 22–32)
Calcium: 10 mg/dL (ref 8.9–10.3)
Chloride: 108 mmol/L (ref 98–111)
Creatinine, Ser: 1.1 mg/dL (ref 0.61–1.24)
GFR, Estimated: >60 mL/min
Glucose, Bld: 118 mg/dL — ABNORMAL HIGH (ref 70–99)
Potassium: 4.5 mmol/L (ref 3.5–5.1)
Sodium: 143 mmol/L (ref 135–145)

## 2024-01-14 LAB — CBC
HCT: 31.7 % — ABNORMAL LOW (ref 39.0–52.0)
Hemoglobin: 10.4 g/dL — ABNORMAL LOW (ref 13.0–17.0)
MCH: 29.2 pg (ref 26.0–34.0)
MCHC: 32.8 g/dL (ref 30.0–36.0)
MCV: 89 fL (ref 80.0–100.0)
Platelets: 213 K/uL (ref 150–400)
RBC: 3.56 MIL/uL — ABNORMAL LOW (ref 4.22–5.81)
RDW: 13.1 % (ref 11.5–15.5)
WBC: 7.2 K/uL (ref 4.0–10.5)
nRBC: 0 % (ref 0.0–0.2)

## 2024-01-14 LAB — URINE CULTURE: Culture: NO GROWTH

## 2024-01-14 LAB — MAGNESIUM: Magnesium: 2.3 mg/dL (ref 1.7–2.4)

## 2024-01-14 MED ORDER — AMOXICILLIN-POT CLAVULANATE 875-125 MG PO TABS
1.0000 | ORAL_TABLET | Freq: Two times a day (BID) | ORAL | Status: DC
  Administered 2024-01-15: 1 via ORAL
  Filled 2024-01-14: qty 1

## 2024-01-14 NOTE — Progress Notes (Signed)
" ° °  Brief Progress Note   _____________________________________________________________________________________________________________  Patient Name: David Mooney Patient DOB: 1940-03-27 Date: @TODAY @   Pt is scheduled to go to [facility] depending on issurance auth  _____________________________________________________________________________________________________________  The [facility] RN Expeditor Ronal DELENA Bald Please contact us  directly via secure chat (search for [facility]) or by calling us  at [phone] [facility]).  "

## 2024-01-14 NOTE — Progress Notes (Signed)
 SLP Cancellation Note  Patient Details Name: BRAEDAN MEUTH MRN: [id number] DOB: [birth year]   Cancelled treatment:       Reason Eval/Treat Not Completed: SLP screened, no needs identified, will sign off; pt at baseline functioning for cognition; no ST needs identified with speech/language and/or cognition during screen and family report.  Thank you for this consult.   Pat Savanna Dooley,M.S.,CCC-SLP 01/14/2024, 12:49 PM

## 2024-01-14 NOTE — Plan of Care (Signed)
" °  Problem: Education: Goal: Knowledge of disease or condition will improve Outcome: Progressing   Problem: Ischemic Stroke/TIA Tissue Perfusion: Goal: Complications of ischemic stroke/TIA will be minimized Outcome: Progressing   Problem: Coping: Goal: Will verbalize positive feelings about self Outcome: Progressing Goal: Will identify appropriate support needs Outcome: Progressing   Problem: Nutrition: Goal: Risk of aspiration will decrease Outcome: Progressing   Problem: Clinical Measurements: Goal: Ability to maintain clinical measurements within normal limits will improve Outcome: Progressing   Problem: Safety: Goal: Ability to remain free from injury will improve Outcome: Progressing   Problem: Skin Integrity: Goal: Risk for impaired skin integrity will decrease Outcome: Progressing   "

## 2024-01-14 NOTE — Progress Notes (Signed)
 Occupational Therapy Treatment Patient Details Name: David Mooney MRN: [id number] DOB: January 02, 1941 Today's Date: 01/14/2024   History of present illness David Mooney is an 83 y.o. male with medical history significant of BPH, duodenal ulcer, ulcerative colitis, DVT, recent TIA (12/1) who presents to the emergency department due to altered mental status.  At bedside, patient was unable to provide details regarding why he came to the ED, history was obtained from ED PA and ED medical record.  Per report, patient last known well was 9 PM last night, he has had multiple falls when he gets up to use the restroom at night due to balancing issues whereby he tends to lean to the right.  Patient also has needed assistance in getting up from sitting position, has had some urinary incontinence (changed from baseline).  No report of vomiting, diarrhea, fever or chills. (per DO)   OT comments  Pt agreeable to OT treatment. Pt's wife present and observing the session. Pt required supervision assist for supine to sit today. Mod A for initial stand from EOB with RW, but this improved in other attempts at the chair later in session with pt demonstrating more CGA to min A. Pt able to ambulate to the sink to compete face washing with min A due to difficulty maintaining balance at times with slight posterior lean. Pt left in the chair with call bell within reach, family present, and chair alarm set. Pt will benefit from continued OT in the hospital to increase strength, balance, and endurance for safe ADL's.         If plan is discharge home, recommend the following:  A lot of help with walking and/or transfers;A little help with bathing/dressing/bathroom;Assistance with cooking/housework;Assist for transportation;Help with stairs or ramp for entrance   Equipment Recommendations  None recommended by OT          Precautions / Restrictions Precautions Precautions: Fall Recall of Precautions/Restrictions:  Impaired Restrictions Weight Bearing Restrictions Per Provider Order: No       Mobility Bed Mobility Overal bed mobility: Needs Assistance Bed Mobility: Supine to Sit     Supine to sit: Supervision, Contact guard     General bed mobility comments: Mild labored movement, but no physical assist to sit at EOB.    Transfers Overall transfer level: Needs assistance Equipment used: Rolling walker (2 wheels) Transfers: Sit to/from Stand, Bed to chair/wheelchair/BSC Sit to Stand: Mod assist, Min assist, Contact guard assist (CGA to min A from chair near the end of session.)     Step pivot transfers: Min assist     General transfer comment: Mod A for initial sit to stand from EOB due to posterior lean, but this improved in additional attempts from the chair with more CGA to min A to stand x5 consecutive reps. Min A for step pivot to chair with RW.     Balance Overall balance assessment: Needs assistance Sitting-balance support: No upper extremity supported, Feet supported Sitting balance-Leahy Scale: Fair Sitting balance - Comments: seated at EOB Postural control: Posterior lean (Slight posterior lean) Standing balance support: Bilateral upper extremity supported, During functional activity, Reliant on assistive device for balance Standing balance-Leahy Scale: Poor Standing balance comment: poor to fair with RW                           ADL either performed or assessed with clinical judgement   ADL Overall ADL's : Needs assistance/impaired  Grooming: Minimal assistance;Standing;Wash/dry face Grooming Details (indicate cue type and reason): Pt able to wash and dry face whiel standing at the sink within the RW. Pt required min A due to some posterior leaning whiel standing. Pt able to stand to complete grooming for ~2 to 3 minutes.                             Functional mobility during ADLs: Minimal assistance;Rolling walker (2 wheels) General ADL  Comments: Pt able to ambulate to the sink with CGA to min A. When walking backwards from the sink pt required more min A with the RW.     Communication Communication Communication: No apparent difficulties   Cognition Arousal: Alert Behavior During Therapy: WFL for tasks assessed/performed Cognition: History of cognitive impairments             OT - Cognition Comments: Chart indicates mild cognitive impairment.                 Following commands: Intact        Cueing   Cueing Techniques: Verbal cues, Visual cues, Tactile cues  Exercises                   Pertinent Vitals/ Pain       Pain Assessment Pain Assessment: No/denies pain                                                          Frequency  Min 2X/week        Progress Toward Goals  OT Goals(current goals can now be found in the care plan section)  Progress towards OT goals: Progressing toward goals  Acute Rehab OT Goals Patient Stated Goal: Improve function. OT Goal Formulation: With patient Time For Goal Achievement: 01/27/24 Potential to Achieve Goals: Good ADL Goals Pt Will Perform Grooming: with modified independence;standing Pt Will Perform Lower Body Bathing: with modified independence Pt Will Perform Lower Body Dressing: with modified independence Pt Will Transfer to Toilet: with modified independence;ambulating Pt Will Perform Toileting - Clothing Manipulation and hygiene: with modified independence Pt/caregiver will Perform Home Exercise Program: Increased strength;Both right and left upper extremity;Independently  Plan                                      End of Session Equipment Utilized During Treatment: Gait belt;Rolling walker (2 wheels)  OT Visit Diagnosis: Unsteadiness on feet (R26.81);Other abnormalities of gait and mobility (R26.89);Muscle weakness (generalized) (M62.81);Other symptoms and signs involving cognitive function    Activity Tolerance Patient tolerated treatment well   Patient Left in chair;with call bell/phone within reach;with chair alarm set;with family/visitor present   Nurse Communication Mobility status        Time: 1434-1456 OT Time Calculation (min): 22 min  Charges: OT General Charges $OT Visit: 1 Visit OT Treatments $Self Care/Home Management : 8-22 mins  [facility] OT, MOT  Jayson Person 01/14/2024, 3:44 PM

## 2024-01-14 NOTE — TOC Progression Note (Signed)
 Transition of Care [facility]) - Progression Note    Patient Details  Name: David Mooney MRN: [id number] Date of Birth: [birth year]  Transition of Care [facility] CM/SW Contact  Lucie Lunger, [place] Phone Number: 01/14/2024, 11:40 AM  Clinical Narrative:    CSW spoke with pts daughter who confirms family and pt would like to accept bed at [facility]. CSW updated HUB to reflect bed choice. Insurance auth started and pending for SNF at [facility]. TOC to follow.   Expected Discharge Plan: Skilled Nursing Facility Barriers to Discharge: Continued Medical Work up               Expected Discharge Plan and Services In-house Referral: Clinical Social Work Discharge Planning Services: CM Consult Post Acute Care Choice: Skilled Nursing Facility Living arrangements for the past 2 months: Single Family Home                                       Social Drivers of Health (SDOH) Interventions SDOH Screenings   Food Insecurity: No Food Insecurity (01/11/2024)  Housing: Low Risk (01/11/2024)  Transportation Needs: No Transportation Needs (01/11/2024)  Utilities: Not At Risk (01/11/2024)  Depression (PHQ2-9): Low Risk (11/22/2023)  Social Connections: Socially Integrated (01/11/2024)  Tobacco Use: Low Risk (01/10/2024)    Readmission Risk Interventions    08/17/2023    8:02 AM 08/14/2023    9:18 AM  Readmission Risk Prevention Plan  Medication Screening Complete Complete  Transportation Screening Complete Complete

## 2024-01-14 NOTE — Progress Notes (Addendum)
 "          PROGRESS NOTE  ROSALIO Mooney FMW:[id number] DOB: 1940/02/07 DOA: 01/10/2024 PCP: Lari Elspeth BRAVO, MD  Brief History54  83 year old male with a history of BPH, ulcerative colitis, DVT (01/07/23--finished Eliquis course 08/03/2023), recent TIA 12/16/23, duodenal ulcer presenting with altered mental status and multiple falls.  Apparently there was concern for another neurologic event because the patient was  falling to the right.  However the patient has had generalized weakness and unsteady gait.  MRI of the brain was negative for any acute findings.  PT recommending skilled nursing facility.  However, the patient began developing fevers on 01/11/2024.  Blood cultures were obtained.  There was concern for aspiration pneumonia.  He was started on Zosyn .  He has continued to have low-grade fevers in the early morning of 01/13/2024.  Lactic acid 1.1>> 1.2.   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 2 mm noncalcified nodule in the left upper lobe. No follow-up needed if patient is low-risk  Fever - COVID-19/RSV/flu--negative - Viral respiratory panel--neg - improved   BPH - Continue Myrbetriq  and finasteride    Mixed hyperlipidemia - Continue statin   GERD/duodenal ulcer - Continue Carafate  - Continue PPI   Ulcerative colitis - Patient is on Tremfya  q. 28 days - No longer taking mesalamine    Hypokalemia -repleted   Mild Cognitive Impairment -family endorses that pt has been  lost while driving and occasionally slow to respond -outpt referral for neuropsych eval   Lung Nodule -outpt follow up       Family Communication:   daughter 12/30   Consultants:  none   Code Status:  FULL    DVT Prophylaxis:  [place] Lovenox      Procedures: As Listed in Progress Note Above   Antibiotics: Vanc 12/27>>12/29 Zosyn  12/27>>12/31 -amox/clav 12/31>>          Subjective: Patient denies fevers, chills, headache, chest pain, dyspnea, nausea, vomiting, diarrhea, abdominal pain, dysuria, hematuria, hematochezia, and melena.   Objective: Vitals:   01/13/24 1500 01/13/24 2009 01/14/24 0009 01/14/24 0332  BP: 105/64 (!) 113/51 117/63 95/73  Pulse: 84 86 81 84  Resp: 18 20 17 19   Temp:  100.1 F (37.8 C) 98.1 F (36.7 C) 98.7 F (37.1 C)  TempSrc:  Oral Oral Oral  SpO2: 100% 97% 96% 98%  Weight:      Height:        Intake/Output Summary (Last 24 hours) at 01/14/2024 0850 Last data filed at 01/14/2024 0600 Gross per 24 hour  Intake 912.88 ml  Output 300 ml  Net 612.88 ml   Weight change:  Exam:  General:  Pt is alert, follows commands appropriately, not in acute distress HEENT: No icterus, No thrush, No neck mass, [place]/AT Cardiovascular: RRR, S1/S2, no rubs, no gallops Respiratory: scattered bilateral rales.  No wheeze Abdomen: Soft/+BS, non tender, non distended, no guarding Extremities: No edema, No lymphangitis, No petechiae, No rashes, no synovitis   Data Reviewed: I have personally reviewed following labs and imaging studies Basic Metabolic Panel: Recent Labs  Lab 01/10/24 1445  01/11/24 0450 01/12/24 0540 01/13/24 0320 01/14/24 0437  NA 140 139 138 137 143  K 4.0 3.7 3.5 3.5 4.5  CL 104 105 104 104 108  CO2 23 27 25 24 29   GLUCOSE 113* 116* 119* 113* 118*  BUN 12 10 13 14 15   CREATININE 0.93 0.79 0.96 0.99 1.10  CALCIUM  9.9 9.4 9.4 9.2 10.0  MG  --  2.1 2.2 2.2 2.3  PHOS  --  2.5  --   --   --    Liver Function Tests: Recent  Labs  Lab 01/10/24 1445 01/11/24 0450  AST 30 26  ALT 19 17  ALKPHOS 49 44  BILITOT 0.6 0.6  PROT 7.0 6.0*  ALBUMIN 4.3 3.6   No results for input(s): LIPASE, AMYLASE in the last 168 hours. No results for input(s): AMMONIA in the last 168 hours. Coagulation Profile: No results for input(s): INR, PROTIME in the last 168 hours. CBC: Recent Labs  Lab 01/10/24 1445 01/11/24 0450 01/12/24 0540 01/13/24 0320 01/14/24 0437  WBC 10.9* 8.4 8.9 8.1 7.2  HGB 12.0* 10.4* 10.8* 9.9* 10.4*  HCT 35.7* 31.0* 32.7* 30.3* 31.7*  MCV 89.0 89.3 88.1 89.6 89.0  PLT 150 137* 153 165 213   Cardiac Enzymes: No results for input(s): CKTOTAL, CKMB, CKMBINDEX, TROPONINI in the last 168 hours. BNP: Invalid input(s): POCBNP CBG: Recent Labs  Lab 01/10/24 1445  GLUCAP 113*   HbA1C: No results for input(s): HGBA1C in the last 72 hours. Urine analysis:    Component Value Date/Time   COLORURINE YELLOW 01/13/2024 0746   APPEARANCEUR HAZY (A) 01/13/2024 0746   APPEARANCEUR Clear 10/23/2023 1024   LABSPEC 1.016 01/13/2024 0746   PHURINE 5.0 01/13/2024 0746   GLUCOSEU NEGATIVE 01/13/2024 0746   HGBUR MODERATE (A) 01/13/2024 0746   BILIRUBINUR NEGATIVE 01/13/2024 0746   BILIRUBINUR Negative 10/23/2023 1024   KETONESUR NEGATIVE 01/13/2024 0746   PROTEINUR NEGATIVE 01/13/2024 0746   NITRITE NEGATIVE 01/13/2024 0746   LEUKOCYTESUR MODERATE (A) 01/13/2024 0746   Sepsis Labs: @LABRCNTIP (procalcitonin:4,lacticidven:4) ) Recent Results (from the past 240 hours)  Resp panel by RT-PCR (RSV, Flu A&B, Covid) Anterior Nasal Swab     Status: None   Collection Time: 01/10/24  2:25 PM   Specimen: Anterior Nasal Swab  Result Value Ref Range Status   SARS Coronavirus 2 by RT PCR NEGATIVE NEGATIVE Final    Comment: (NOTE) SARS-CoV-2 target nucleic acids are NOT DETECTED.  The SARS-CoV-2 RNA is generally detectable in upper respiratory specimens during the acute phase of  infection. The lowest concentration of SARS-CoV-2 viral copies this assay can detect is 138 copies/mL. A negative result does not preclude SARS-Cov-2 infection and should not be used as the sole basis for treatment or other patient management decisions. A negative result may occur with  improper specimen collection/handling, submission of specimen other than nasopharyngeal swab, presence of viral mutation(s) within the areas targeted by this assay, and inadequate number of viral copies(<138 copies/mL). A negative result must be combined with clinical observations, patient history, and epidemiological information. The expected result is Negative.  Fact Sheet for Patients:  bloggercourse.com  Fact Sheet for Healthcare Providers:  seriousbroker.it  This test is no t yet approved or cleared by the [place]  FDA and  has been authorized for detection and/or diagnosis of SARS-CoV-2 by FDA under an Emergency Use Authorization (EUA). This EUA will remain  in effect (meaning this test can be used) for the duration of the COVID-19 declaration under Section  564(b)(1) of the Act, 21 U.S.C.section 360bbb-3(b)(1), unless the authorization is terminated  or revoked sooner.       Influenza A by PCR NEGATIVE NEGATIVE Final   Influenza B by PCR NEGATIVE NEGATIVE Final    Comment: (NOTE) The Xpert Xpress SARS-CoV-2/FLU/RSV plus assay is intended as an aid in the diagnosis of influenza from Nasopharyngeal swab specimens and should not be used as a sole basis for treatment. Nasal washings and aspirates are unacceptable for Xpert Xpress SARS-CoV-2/FLU/RSV testing.  Fact Sheet for Patients: bloggercourse.com  Fact Sheet for Healthcare Providers: seriousbroker.it  This test is not yet approved or cleared by the [place]  FDA and has been authorized for detection and/or diagnosis of SARS-CoV-2  by FDA under an Emergency Use Authorization (EUA). This EUA will remain in effect (meaning this test can be used) for the duration of the COVID-19 declaration under Section 564(b)(1) of the Act, 21 U.S.C. section 360bbb-3(b)(1), unless the authorization is terminated or revoked.     Resp Syncytial Virus by PCR NEGATIVE NEGATIVE Final    Comment: (NOTE) Fact Sheet for Patients: bloggercourse.com  Fact Sheet for Healthcare Providers: seriousbroker.it  This test is not yet approved or cleared by the [place]  FDA and has been authorized for detection and/or diagnosis of SARS-CoV-2 by FDA under an Emergency Use Authorization (EUA). This EUA will remain in effect (meaning this test can be used) for the duration of the COVID-19 declaration under Section 564(b)(1) of the Act, 21 U.S.C. section 360bbb-3(b)(1), unless the authorization is terminated or revoked.  Performed at [facility], [address], [place], [place] [address]   Culture, blood (Routine X 2) w Reflex to ID Panel     Status: None (Preliminary result)   Collection Time: 01/11/24 12:09 PM   Specimen: BLOOD  Result Value Ref Range Status   Specimen Description BLOOD RIGHT ANTECUBITAL  Final   Special Requests   Final    BOTTLES DRAWN AEROBIC AND ANAEROBIC Blood Culture adequate volume   Culture   Final    NO GROWTH 3 DAYS Performed at [facility], [address] [place], [place] [address]    Report Status PENDING  Incomplete  Culture, blood (Routine X 2) w Reflex to ID Panel     Status: None (Preliminary result)   Collection Time: 01/11/24 12:17 PM   Specimen: BLOOD  Result Value Ref Range Status   Specimen Description BLOOD BLOOD LEFT WRIST  Final   Special Requests   Final    BOTTLES DRAWN AEROBIC AND ANAEROBIC Blood Culture adequate volume   Culture   Final    NO GROWTH 3 DAYS Performed at [facility], [address], [place], [place] [address]    Report  Status PENDING  Incomplete  MRSA Next Gen by PCR, Nasal     Status: None   Collection Time: 01/11/24 12:39 PM   Specimen: Nasal Mucosa; Nasal Swab  Result Value Ref Range Status   MRSA by PCR Next Gen NOT DETECTED NOT DETECTED Final    Comment: (NOTE) The GeneXpert MRSA Assay (FDA approved for NASAL specimens only), is one component of a comprehensive MRSA colonization surveillance program. It is not intended to diagnose MRSA infection nor to guide or monitor treatment for MRSA infections. Test performance is not FDA approved in patients less than 24 years old. Performed at [facility], [address] [place], [place] [address]   Respiratory (~20 pathogens) panel by PCR     Status: None   Collection Time:  01/13/24  7:37 AM   Specimen: Nasopharyngeal Swab; Respiratory  Result Value Ref Range Status   Adenovirus NOT DETECTED NOT DETECTED Final   Coronavirus 229E NOT DETECTED NOT DETECTED Final    Comment: (NOTE) The Coronavirus on the Respiratory Panel, DOES NOT test for the novel  Coronavirus (2019 nCoV)    Coronavirus HKU1 NOT DETECTED NOT DETECTED Final   Coronavirus NL63 NOT DETECTED NOT DETECTED Final   Coronavirus OC43 NOT DETECTED NOT DETECTED Final   Metapneumovirus NOT DETECTED NOT DETECTED Final   Rhinovirus / Enterovirus NOT DETECTED NOT DETECTED Final   Influenza A NOT DETECTED NOT DETECTED Final   Influenza B NOT DETECTED NOT DETECTED Final   Parainfluenza Virus 1 NOT DETECTED NOT DETECTED Final   Parainfluenza Virus 2 NOT DETECTED NOT DETECTED Final   Parainfluenza Virus 3 NOT DETECTED NOT DETECTED Final   Parainfluenza Virus 4 NOT DETECTED NOT DETECTED Final   Respiratory Syncytial Virus NOT DETECTED NOT DETECTED Final   Bordetella pertussis NOT DETECTED NOT DETECTED Final   Bordetella Parapertussis NOT DETECTED NOT DETECTED Final   Chlamydophila pneumoniae NOT DETECTED NOT DETECTED Final   Mycoplasma pneumoniae NOT DETECTED NOT DETECTED Final    Comment:  Performed at [facility], [address] [place], [place] [address]     Scheduled Meds:   stroke: early stages of recovery book   Does not apply Once   aspirin   81 mg Oral Daily   atorvastatin   20 mg Oral Daily   azithromycin   500 mg Oral Daily   clopidogrel   75 mg Oral Daily   enoxaparin  (LOVENOX ) injection  40 mg Subcutaneous Q24H   finasteride   5 mg Oral Daily   Latanoprostene Bunod   1 drop Left Eye QHS   mirabegron  ER  50 mg Oral Daily   pantoprazole   80 mg Oral Q1200   polyethylene glycol  17 g Oral Daily   sucralfate   1 g Oral TID AC & HS   Continuous Infusions:  piperacillin -tazobactam (ZOSYN )  IV 3.375 g (01/14/24 0512)    Procedures/Studies: EEG adult Result Date: 01/13/2024 Shelton Arlin KIDD, MD     01/13/2024  5:35 PM Patient Name: David Mooney MRN: [id number] Epilepsy Attending: Arlin KIDD Shelton Referring Physician/Provider: Evonnie Lenis, MD Date: 01/13/2024 Duration: 23.32 mins Patient history: 83yo M with ams. EEG to evaluate for seizure Level of alertness: Awake AEDs during EEG study: None Technical aspects: This EEG study was done with scalp electrodes positioned according to the 10-20 International system of electrode placement. Electrical activity was reviewed with band pass filter of 1-70Hz , sensitivity of 7 uV/mm, display speed of 21mm/sec with a 60Hz  notched filter applied as appropriate. EEG data were recorded continuously and digitally stored.  Video monitoring was available and reviewed as appropriate. Description: The posterior dominant rhythm consists of 8-9 Hz activity of moderate voltage (25-35 uV) seen predominantly in posterior head regions, symmetric and reactive to eye opening and eye closing. EEG showed intermittent generalized 3 to 6 Hz theta-delta slowing. Hyperventilation and photic stimulation were not performed.   ABNORMALITY - Intermittent slow, generalized IMPRESSION: This study is suggestive of mild diffuse encephalopathy. No seizures or  epileptiform discharges were seen throughout the recording. Arlin KIDD Shelton   ECHOCARDIOGRAM LIMITED Result Date: 01/11/2024    ECHOCARDIOGRAM LIMITED REPORT   Patient Name:   David Mooney Date of Exam: 01/11/2024 Medical Rec #:  [id number]       Height:       68.0  in Accession #:    [id number]      Weight:       176.6 lb Date of Birth:  [birth year]       BSA:          1.938 m Patient Age:    83 years        BP:           96/41 mmHg Patient Gender: M               HR:           95 bpm. Exam Location:  [facility] Procedure: Limited Echo, Limited Color Doppler, Cardiac Doppler and PEDOF (Both            Spectral and Color Flow Doppler were utilized during procedure). Indications:    TIA G45.9  History:        Patient has prior history of Echocardiogram examinations, most                 recent 12/16/2023. TIA and DVT, GERD.  Sonographer:    Koleen Popper RDCS Referring Phys: [id number] OLADAPO ADEFESO IMPRESSIONS  1. Left ventricular ejection fraction, by estimation, is 65 to 70%. The left ventricle has normal function. The left ventricle has no regional wall motion abnormalities. Left ventricular diastolic parameters are consistent with Grade I diastolic dysfunction (impaired relaxation).  2. The mitral valve is normal in structure. No evidence of mitral valve regurgitation. No evidence of mitral stenosis.  3. The aortic valve was not well visualized. Aortic valve regurgitation is trivial. Moderate aortic valve stenosis. Aortic valve area, by VTI measures 1.33 cm. Aortic valve mean gradient measures 25.0 mmHg. Aortic valve Vmax measures 3.40 m/s. Aortic valve acceleration time measures 102 msec.  4. Tricuspid regurgitation signal is inadequate for assessing PA pressure. Comparison(s): No significant change from prior study. FINDINGS  Left Ventricle: Left ventricular ejection fraction, by estimation, is 65 to 70%. The left ventricle has normal function. The left ventricle has no regional wall motion abnormalities.  The left ventricular internal cavity size was normal in size. There is  no left ventricular hypertrophy. Left ventricular diastolic parameters are consistent with Grade I diastolic dysfunction (impaired relaxation). Right Ventricle: Tricuspid regurgitation signal is inadequate for assessing PA pressure. Left Atrium: Left atrial size was normal in size. Right Atrium: Right atrial size was normal in size. Mitral Valve: The mitral valve is normal in structure. No evidence of mitral valve stenosis. Tricuspid Valve: The tricuspid valve is normal in structure. Tricuspid valve regurgitation is not demonstrated. No evidence of tricuspid stenosis. Aortic Valve: The aortic valve was not well visualized. Aortic valve regurgitation is trivial. Moderate aortic stenosis is present. Aortic valve mean gradient measures 25.0 mmHg. Aortic valve peak gradient measures 46.2 mmHg. Aortic valve area, by VTI measures 1.33 cm. Pulmonic Valve: The pulmonic valve was not well visualized. Pulmonic valve regurgitation is not visualized. No evidence of pulmonic stenosis. IAS/Shunts: No atrial level shunt detected by color flow Doppler. Additional Comments: Spectral Doppler performed. Color Doppler performed.  LEFT VENTRICLE PLAX 2D LVIDd:         4.50 cm      Diastology LVIDs:         2.50 cm      LV e' medial:    6.64 cm/s LV PW:         1.00 cm      LV E/e' medial:  11.0 LV IVS:  1.20 cm      LV e' lateral:   7.07 cm/s LVOT diam:     1.90 cm      LV E/e' lateral: 10.4 LV SV:         70 LV SV Index:   36 LVOT Area:     2.84 cm  LV Volumes (MOD) LV vol d, MOD A4C: 110.0 ml LV vol s, MOD A4C: 46.3 ml LV SV MOD A4C:     110.0 ml RIGHT VENTRICLE             IVC RV S prime:     17.10 cm/s  IVC diam: 2.70 cm LEFT ATRIUM             Index LA diam:        3.60 cm 1.86 cm/m LA Vol (A2C):   31.0 ml 15.99 ml/m LA Vol (A4C):   33.7 ml 17.39 ml/m LA Biplane Vol: 33.0 ml 17.02 ml/m  AORTIC VALVE AV Area (Vmax):    1.37 cm AV Area (Vmean):    1.31 cm AV Area (VTI):     1.33 cm AV Vmax:           340.00 cm/s AV Vmean:          224.500 cm/s AV VTI:            0.527 m AV Peak Grad:      46.2 mmHg AV Mean Grad:      25.0 mmHg LVOT Vmax:         164.00 cm/s LVOT Vmean:        104.000 cm/s LVOT VTI:          0.247 m LVOT/AV VTI ratio: 0.47  AORTA Ao Root diam: 3.20 cm MITRAL VALVE MV Area (PHT): 4.06 cm     SHUNTS MV Decel Time: 187 msec     Systemic VTI:  0.25 m MV E velocity: 73.30 cm/s   Systemic Diam: 1.90 cm MV A velocity: 112.00 cm/s MV E/A ratio:  0.65 Stanly Leavens MD Electronically signed by Stanly Leavens MD Signature Date/Time: 01/11/2024/2:52:09 PM    Final    DG CHEST PORT 1 VIEW Result Date: 01/11/2024 CLINICAL DATA:  Dyspnea and respiratory abnormalities EXAM: PORTABLE CHEST - 1 VIEW COMPARISON:  01/10/2024 FINDINGS: Cardiomediastinal silhouette and pulmonary vasculature are within normal limits. Patchy opacity seen at the LEFT lung base have mildly increased since prior examination. There is minimal blunting of both lateral costophrenic angles. IMPRESSION: 1. Patchy LEFT lung base airspace opacities are suspicious for developing pneumonia. 2. Minimal blunting of BILATERAL costophrenic angles suspicious for trace pleural effusions. Electronically Signed   By: Aliene Lloyd M.D.   On: 01/11/2024 13:55   DG Chest 2 View Result Date: 01/10/2024 EXAM: 2 VIEW(S) XRAY OF THE CHEST 01/10/2024 06:27:00 PM COMPARISON: 08/13/2023 CLINICAL HISTORY: AMS FINDINGS: LUNGS AND PLEURA: Linear opacity at left base consistent with scarring or atelectasis. No pleural effusion. No pneumothorax. HEART AND MEDIASTINUM: Calcified aorta. BONES AND SOFT TISSUES: No acute osseous abnormality. IMPRESSION: 1. No acute cardiopulmonary abnormality. Electronically signed by: Morgane Naveau MD 01/10/2024 08:30 PM EST RP Workstation: [id number]   MR BRAIN WO CONTRAST Result Date: 01/10/2024 EXAM: MRI BRAIN WITHOUT CONTRAST 01/10/2024 05:40:24 PM  TECHNIQUE: Multiplanar multisequence MRI of the head/brain was performed without the administration of intravenous contrast. COMPARISON: CT head earlier today. CLINICAL HISTORY: Recent TIA, AMS, ataxic FINDINGS: BRAIN AND VENTRICLES: No acute infarct. No intracranial hemorrhage. No mass. No midline shift.  No hydrocephalus. The sella is unremarkable. Normal flow voids. ORBITS: No acute abnormality. SINUSES AND MASTOIDS: No acute abnormality. BONES AND SOFT TISSUES: Normal marrow signal. No acute soft tissue abnormality. IMPRESSION: 1. No acute intracranial abnormality. Electronically signed by: Gilmore Molt 01/10/2024 07:05 PM EST RP Workstation: [id number]   CT Cervical Spine Wo Contrast Result Date: 01/10/2024 EXAM: CT CERVICAL SPINE WITHOUT CONTRAST 01/10/2024 03:34:09 PM TECHNIQUE: CT of the cervical spine was performed without the administration of intravenous contrast. Multiplanar reformatted images are provided for review. Automated exposure control, iterative reconstruction, and/or weight based adjustment of the mA/kV was utilized to reduce the radiation dose to as low as reasonably achievable. COMPARISON: 01/02/2018 CLINICAL HISTORY: Neck trauma (Age >= 65y) FINDINGS: BONES AND ALIGNMENT: Straightening of the normal cervical lordosis. Trace degenerative retrolisthesis of C3 on C4. Additional trace degenerative anterolisthesis of C7 on T1. No evidence of traumatic malalignment. No acute fracture. DEGENERATIVE CHANGES: Disc space narrowing and degenerative endplate osteophytes at multiple levels. Degenerative endplate changes greatest at C6-C7. Facet arthrosis and uncovertebral hypertrophy at multiple levels. No high grade osseous spinal canal stenosis. SOFT TISSUES: No prevertebral soft tissue swelling. IMPRESSION: 1. No evidence of acute traumatic injury. Electronically signed by: Donnice Mania MD 01/10/2024 03:48 PM EST RP Workstation: [id number]   CT Head Wo Contrast Result Date: 01/10/2024 EXAM:  CT HEAD WITHOUT CONTRAST 01/10/2024 03:34:09 PM TECHNIQUE: CT of the head was performed without the administration of intravenous contrast. Automated exposure control, iterative reconstruction, and/or weight based adjustment of the mA/kV was utilized to reduce the radiation dose to as low as reasonably achievable. COMPARISON: 12/15/2023 CLINICAL HISTORY: Head trauma, minor (Age >= 65y); Mental status change, unknown cause. FINDINGS: BRAIN AND VENTRICLES: No acute hemorrhage. No evidence of acute infarct. No hydrocephalus. No extra-axial collection. No mass effect or midline shift. 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 Age-related cerebral volume loss. Atherosclerosis of the carotid siphons and intracranial vertebral arteries. There is tortuosity of the basilar artery without evidence of dilatation. ORBITS: Bilateral lens replacement. SINUSES: No acute abnormality. SOFT TISSUES AND SKULL: No acute soft tissue abnormality. No skull fracture. IMPRESSION: 1. No acute intracranial abnormality. Electronically signed by: Donnice Mania MD 01/10/2024 03:43 PM EST RP Workstation: [id number]   EEG adult Result Date: 12/16/2023 Gregg Lek, MD     12/16/2023  4:34 PM Patient Name: David Mooney MRN: [id number] Epilepsy Attending: Lek Gregg Referring Physician/Provider: Sallyann Normie HERO, MD     Date: 12/16/2023 Duration: 22 minutes Patient history: 83 year old man found in his car in the parking lot with weakness. EEG to evaluate for seizure. Level of alertness: Awake, drowsy AEDs during EEG study: None Technical aspects: This EEG study was done with scalp electrodes positioned according to the 10-20 International system of electrode placement. Electrical activity was reviewed with band pass filter of 1-70Hz , sensitivity of 7 uV/mm, display speed of 68mm/sec with a 60Hz  notched filter applied as appropriate. EEG data were recorded continuously and digitally stored.  Video monitoring  was available and reviewed as appropriate. Description: The posterior dominant rhythm consists of 8-9 Hz activity of moderate voltage (25-35 uV) seen predominantly in posterior head regions, symmetric and reactive to eye opening and eye closing. Drowsiness was characterized by attenuation of the posterior background rhythm. Sleep was not seen.  There was additional period of excessive amount of 15 to 18 Hz, 2-3 uV beta activity distributed symmetrically and diffusely. Hyperventilation and photic stimulation were not performed.   ABNORMALITY - Excessive  beta, generalized IMPRESSION: This study is within normal limits. No seizures or epileptiform discharges were seen throughout the recording. The excessive beta activity seen in the background is most likely due to the effect of benzodiazepine and is a benign EEG pattern. A normal interictal EEG does not exclude nor support the diagnosis of epilepsy. Pastor Falling MD Neurology    ECHOCARDIOGRAM COMPLETE Result Date: 12/16/2023    ECHOCARDIOGRAM REPORT   Patient Name:   David Mooney Date of Exam: 12/16/2023 Medical Rec #:  [id number]       Height:       66.0 in Accession #:    [id number]      Weight:       170.0 lb Date of Birth:  1940/08/18       BSA:          1.866 m Patient Age:    83 years        BP:           92/51 mmHg Patient Gender: M               HR:           76 bpm. Exam Location:  [facility] Procedure: 2D Echo, Cardiac Doppler and Color Doppler (Both Spectral and Color            Flow Doppler were utilized during procedure). Indications:    Stroke l63.9  History:        Patient has no prior history of Echocardiogram examinations.                 TIA, Stroke and DVT.  Sonographer:    Aida Pizza RCS Referring Phys: [id number] OLADAPO ADEFESO IMPRESSIONS  1. Left ventricular ejection fraction, by estimation, is 65 to 70%. The left ventricle has normal function. The left ventricle has no regional wall motion abnormalities. There is mild asymmetric left  ventricular hypertrophy of the basal segment. Left ventricular diastolic parameters are consistent with Grade I diastolic dysfunction (impaired relaxation).  2. Right ventricular systolic function is normal. The right ventricular size is normal. Tricuspid regurgitation signal is inadequate for assessing PA pressure.  3. The mitral valve is degenerative. Trivial mitral valve regurgitation.  4. The aortic valve is abnormal. There is moderate calcification of the aortic valve. Aortic valve regurgitation is trivial. Moderate aortic valve stenosis. Aortic valve mean gradient measures 21.0 mmHg. Dimentionless index 0.46.  5. The inferior vena cava is normal in size with greater than 50% respiratory variability, suggesting right atrial pressure of 3 mmHg. Comparison(s): No prior Echocardiogram. FINDINGS  Left Ventricle: Left ventricular ejection fraction, by estimation, is 65 to 70%. The left ventricle has normal function. The left ventricle has no regional wall motion abnormalities. The left ventricular internal cavity size was normal in size. There is  mild asymmetric left ventricular hypertrophy of the basal segment. Left ventricular diastolic parameters are consistent with Grade I diastolic dysfunction (impaired relaxation). Right Ventricle: The right ventricular size is normal. No increase in right ventricular wall thickness. Right ventricular systolic function is normal. Tricuspid regurgitation signal is inadequate for assessing PA pressure. Left Atrium: Left atrial size was normal in size. Right Atrium: Right atrial size was normal in size. Pericardium: There is no evidence of pericardial effusion. Presence of epicardial fat layer. Mitral Valve: The mitral valve is degenerative in appearance. Mild mitral annular calcification. Trivial mitral valve regurgitation. Tricuspid Valve: The tricuspid valve is grossly normal. Tricuspid valve regurgitation is trivial. Aortic Valve:  The aortic valve is abnormal. There is  moderate calcification of the aortic valve. There is mild aortic valve annular calcification. Aortic valve regurgitation is trivial. Aortic regurgitation PHT measures 562 msec. Moderate aortic stenosis  is present. Aortic valve mean gradient measures 21.0 mmHg. Aortic valve peak gradient measures 39.0 mmHg. Aortic valve area, by VTI measures 1.17 cm. Pulmonic Valve: The pulmonic valve was grossly normal. Pulmonic valve regurgitation is trivial. Aorta: The aortic root is normal in size and structure. Venous: The inferior vena cava is normal in size with greater than 50% respiratory variability, suggesting right atrial pressure of 3 mmHg. IAS/Shunts: No atrial level shunt detected by color flow Doppler. Additional Comments: 3D was performed not requiring image post processing on an independent workstation and was indeterminate.  LEFT VENTRICLE PLAX 2D LVIDd:         4.40 cm   Diastology LVIDs:         3.00 cm   LV e' medial:    6.84 cm/s LV PW:         1.00 cm   LV E/e' medial:  13.8 LV IVS:        1.10 cm   LV e' lateral:   7.71 cm/s LVOT diam:     1.80 cm   LV E/e' lateral: 12.2 LV SV:         82 LV SV Index:   44 LVOT Area:     2.54 cm  RIGHT VENTRICLE RV S prime:     15.30 cm/s TAPSE (M-mode): 2.1 cm LEFT ATRIUM             Index        RIGHT ATRIUM           Index LA diam:        3.50 cm 1.88 cm/m   RA Area:     14.00 cm LA Vol (A2C):   51.9 ml 27.81 ml/m  RA Volume:   31.40 ml  16.82 ml/m LA Vol (A4C):   39.6 ml 21.22 ml/m LA Biplane Vol: 45.5 ml 24.38 ml/m  AORTIC VALVE AV Area (Vmax):    0.87 cm AV Area (Vmean):   0.90 cm AV Area (VTI):     1.17 cm AV Vmax:           312.33 cm/s AV Vmean:          211.000 cm/s AV VTI:            0.701 m AV Peak Grad:      39.0 mmHg AV Mean Grad:      21.0 mmHg LVOT Vmax:         107.00 cm/s LVOT Vmean:        74.800 cm/s LVOT VTI:          0.323 m LVOT/AV VTI ratio: 0.46 AI PHT:            562 msec  AORTA Ao Root diam: 3.50 cm MITRAL VALVE MV Area (PHT): 2.80 cm      SHUNTS MV Decel Time: 271 msec     Systemic VTI:  0.32 m MV E velocity: 94.30 cm/s   Systemic Diam: 1.80 cm MV A velocity: 121.00 cm/s MV E/A ratio:  0.78 Jayson Sierras MD Electronically signed by Jayson Sierras MD Signature Date/Time: 12/16/2023/2:51:34 PM    Final    MR BRAIN WO CONTRAST Result Date: 12/16/2023 EXAM: MRI BRAIN WITHOUT CONTRAST 12/16/2023 11:40 AM TECHNIQUE: Multiplanar multisequence MRI of the head/brain  was performed without the administration of intravenous contrast. COMPARISON: CT head 12/15/2023. CLINICAL HISTORY: Neuro deficit, acute, stroke suspected. Acute onset of right sided weakness and slurred speech. FINDINGS: BRAIN AND VENTRICLES: No acute infarct, mass, midline shift, hydrocephalus, or extraaxial fluid collection is identified. Chronic microhemorrhages are noted in the right thalamus and posterior right temporal lobe. T2 hyperintensities in the cerebral white matter bilaterally are nonspecific but compatible with mild chronic small vessel ischemic disease. There is mild cerebral atrophy. Major intracranial vascular flow voids are preserved. ORBITS: No acute abnormality. SINUSES AND MASTOIDS: No acute abnormality. BONES AND SOFT TISSUES: Normal marrow signal. No acute soft tissue abnormality. Bilateral cataract extraction. IMPRESSION: 1. No acute intracranial abnormality. 2. Mild chronic small vessel ischemic disease. Electronically signed by: Dasie Hamburg MD 12/16/2023 12:11 PM EST RP Workstation: [id number]   CT ANGIO HEAD NECK W WO CM Result Date: 12/16/2023 CLINICAL DATA:  Initial evaluation for acute neuro deficit, stroke. EXAM: CT ANGIOGRAPHY HEAD AND NECK WITH AND WITHOUT CONTRAST TECHNIQUE: Multidetector CT imaging of the head and neck was performed using the standard protocol during bolus administration of intravenous contrast. Multiplanar CT image reconstructions and MIPs were obtained to evaluate the vascular anatomy. Carotid stenosis measurements (when applicable)  are obtained utilizing NASCET criteria, using the distal internal carotid diameter as the denominator. RADIATION DOSE REDUCTION: This exam was performed according to the departmental dose-optimization program which includes automated exposure control, adjustment of the mA and/or kV according to patient size and/or use of iterative reconstruction technique. CONTRAST:  75mL OMNIPAQUE  IOHEXOL  350 MG/ML SOLN COMPARISON:  CT from 12/15/2023. FINDINGS: CTA NECK FINDINGS Aortic arch: Visualized aortic arch within normal limits for caliber with standard 3 vessel morphology. Aortic atherosclerosis. No significant stenosis about the origin the great vessels. Right carotid system: Right common and internal carotid arteries are patent without dissection. Mild for age atheromatous change about the right carotid bulb without stenosis. Left carotid system: Left common and internal carotid arteries are patent without dissection. Mild for age atheromatous change about the left carotid bulb without stenosis. Vertebral arteries: Both vertebral arteries arise from subclavian arteries. No significant proximal subclavian artery stenosis vertebral arteries are patent without stenosis or dissection. Skeleton: No worrisome osseous lesions. Moderate to advanced multilevel cervical spondylosis, most pronounced at C6-7. Other neck: No other acute finding. Upper chest: No other acute finding. Review of the MIP images confirms the above findings CTA HEAD FINDINGS Anterior circulation: Atheromatous change about the carotid siphons without hemodynamically significant stenosis. A1 segments patent bilaterally. Normal anterior communicating artery complex. Atheromatous irregularity about the ACAs without proximal high-grade stenosis. Atheromatous irregularity about the M1 segments bilaterally with associated short-segment mild stenosis within the mid left M1 segment (series 12, image 21). No proximal MCA branch occlusion or high-grade stenosis.  Distal MCA branches perfused and symmetric. Diffuse small vessel atheromatous irregularity noted. Posterior circulation: Vertebrobasilar dolichoectasia noted. Atheromatous irregularity about the ectatic V4 segments without high-grade stenosis. Neither PICA origin well visualized. Basilar is irregular and ectatic but patent without stenosis. Superior cerebral arteries patent bilaterally. Both PCAs primarily supplied via the basilar. Atheromatous irregularity about the PCAs without proximal high-grade stenosis. Venous sinuses: Patent allowing for timing the contrast bolus. Anatomic variants: None significant.  No aneurysm. Review of the MIP images confirms the above findings IMPRESSION: 1. Negative CTA for large vessel occlusion or other emergent finding. 2. Tortuosity of the major arterial vasculature of the head and neck with intracranial arterial dolichoectasia, suggesting chronic underlying hypertension. 3. Scattered atheromatous disease about  the carotid bifurcations, carotid siphons, and intracranial circulation, but no hemodynamically significant or correctable stenosis. Aortic Atherosclerosis (ICD10-I70.0). Electronically Signed   By: Morene Hoard M.D.   On: 12/16/2023 02:05   CT HEAD WO CONTRAST Result Date: 12/15/2023 EXAM: CT HEAD WITHOUT 12/15/2023 10:48:44 PM TECHNIQUE: CT of the head was performed without the administration of intravenous contrast. Automated exposure control, iterative reconstruction, and/or weight based adjustment of the mA/kV was utilized to reduce the radiation dose to as low as reasonably achievable. COMPARISON: 12 / 19 / 19 CLINICAL HISTORY: Neuro deficit, acute, stroke suspected FINDINGS: BRAIN AND VENTRICLES: No acute intracranial hemorrhage. No mass effect or midline shift. No extra-axial fluid collection. No evidence of acute infarct. Scattered and confluent periventricular and subcortical white matter hypodense lesions, likely sequela of chronic microvascular  ischemic change. Prominence of ventricles and sulci reflecting age-related volume loss. Calcified atherosclerotic plaque in cavernous/supraclinoid Internal Carotid Artery (ICA) and intradural vertebral arteries. ORBITS: Bilateral lens replacement noted. SINUSES AND MASTOIDS: No acute abnormality. SOFT TISSUES AND SKULL: No acute skull fracture. No acute soft tissue abnormality. IMPRESSION: 1. No acute intracranial abnormality. Electronically signed by: Norman Gatlin MD 12/15/2023 10:58 PM EST RP Workstation: [id number]    Alm Schneider, DO  [facility]  If 7PM-7AM, please contact night-coverage [email or web address] Password [email or web address] 01/14/2024, 8:50 AM   LOS: 3 days   "

## 2024-01-15 MED ORDER — AMOXICILLIN-POT CLAVULANATE 875-125 MG PO TABS: 1.0000 | ORAL_TABLET | Freq: Two times a day (BID) | ORAL | Status: AC

## 2024-01-15 NOTE — Plan of Care (Signed)
" °  Problem: Education: Goal: Knowledge of disease or condition will improve Outcome: Progressing   Problem: Ischemic Stroke/TIA Tissue Perfusion: Goal: Complications of ischemic stroke/TIA will be minimized Outcome: Progressing   Problem: Clinical Measurements: Goal: Ability to maintain clinical measurements within normal limits will improve Outcome: Progressing   Problem: Activity: Goal: Risk for activity intolerance will decrease Outcome: Progressing   Problem: Safety: Goal: Ability to remain free from injury will improve Outcome: Progressing   Problem: Skin Integrity: Goal: Risk for impaired skin integrity will decrease Outcome: Progressing   "

## 2024-01-15 NOTE — TOC Transition Note (Signed)
 Transition of Care [facility]) - Discharge Note   Patient Details  Name: David Mooney MRN: [id number] Date of Birth: [birth year]  [facility]) CM/SW Contact:  Lucie Lunger, LCSWA Phone Number: 01/15/2024, 11:02 AM  Clinical Narrative:    CSW updated that insurance shara has been approved for SNF at [facility] CSW spoke to [place] in admissions who confirms pt can arrive today. CSW sent D/C clinicals to facility via HUB. Room and report provided to RN. CSW updated pts daughter and spouse of plan for D/C today. TOC signing off.   Final next level of care: Skilled Nursing Facility Barriers to Discharge: Barriers Resolved   Patient Goals and CMS Choice Patient states their goals for this hospitalization and ongoing recovery are:: go to SNF CMS Medicare.gov Compare Post Acute Care list provided to:: Patient Represenative (must comment) Choice offered to / list presented to : Patient, Spouse, Adult Children [employer] ownership interest in [facility]provided to:: Spouse    Discharge Placement              Patient chooses bed at: [facility] Patient to be transferred to facility by: Facility staff Name of family member notified: daugher and wife Patient and family notified of of transfer: 01/15/24  Discharge Plan and Services Additional resources added to the After Visit Summary for   In-house Referral: Clinical Social Work Discharge Planning Services: CM Consult Post Acute Care Choice: Skilled Nursing Facility                               Social Drivers of Health (SDOH) Interventions SDOH Screenings   Food Insecurity: No Food Insecurity (01/11/2024)  Housing: Low Risk (01/11/2024)  Transportation Needs: No Transportation Needs (01/11/2024)  Utilities: Not At Risk (01/11/2024)  Depression (PHQ2-9): Low Risk (11/22/2023)  Social Connections: Socially Integrated (01/11/2024)  Tobacco Use: Low Risk (01/10/2024)     Readmission Risk  Interventions    01/15/2024   11:01 AM 08/17/2023    8:02 AM 08/14/2023    9:18 AM  Readmission Risk Prevention Plan  Medication Screening  Complete Complete  Transportation Screening Complete Complete Complete  Home Care Screening Complete    Medication Review (RN CM) Complete

## 2024-01-15 NOTE — Discharge Summary (Signed)
 " Physician Discharge Summary   Patient: David Mooney MRN: [id number] DOB: 18-Aug-1940  Admit date:     01/10/2024  Discharge date: 01/15/2024  Discharge Physician: David Mooney   PCP: David Elspeth BRAVO, MD   Recommendations at discharge:  Repeat basic metabolic panel to follow ultralights renal function Repeat CBC to follow hemoglobin trend/stability Repeat chest x-ray in 6-8 weeks to assure complete resolution of infiltrates.   Discharge Diagnoses: Principal Problem:   TIA (transient ischemic attack) Active Problems:   Crohn's disease of small intestine (HCC)   Crohn's ileocolitis (HCC)   Mild cognitive impairment   Aspiration pneumonitis (HCC)   Acute metabolic encephalopathy  Brief History:  83 year old male with a history of BPH, ulcerative colitis, DVT (01/07/23--finished Eliquis course 08/03/2023), recent TIA 12/16/23, duodenal ulcer presenting with altered mental status and multiple falls.  Apparently there was concern for another neurologic event because the patient was  falling to the right.  However the patient has had generalized weakness and unsteady gait.  MRI of the brain was negative for any acute findings.  PT recommending skilled nursing facility.  However, the patient began developing fevers on 01/11/2024.  Blood cultures were obtained.  There was concern for aspiration pneumonia.  He was started on Zosyn .  He has continued to have low-grade fevers in the early morning of 01/13/2024.  Lactic acid 1.1>> 1.2.   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 -Recent admission with concern for TIA; has completed 21 days of dual antiplatelet therapy.  Continue baby aspirin  on  daily basis for secondary prevention.   Aspiration pneumonia - Personally reviewed chest x-ray--bilateral patchy infiltrates R>L - Continue Zosyn  - MRSA negative - Discontinue vancomycin  - Speech therapy evaluation appreciated>> dysphagia 3 diet - CT chest--Left lower lobe pneumonia and compressive atelectasis. 2 mm noncalcified nodule in the left upper lobe. No follow-up needed if patient is low-risk -Patient will complete 3 more days of oral Augmentin at discharge.   Fever - COVID-19/RSV/flu--negative - Viral respiratory panel--neg - improved and afebrile at discharge.   BPH - Continue Myrbetriq , Flomax  and finasteride    Mixed hyperlipidemia - Continue statin   GERD/duodenal ulcer - Continue Carafate  - Continue PPI   Ulcerative colitis - Patient is on Tremfya  q. 28 days - No longer taking mesalamine  -Continue patient follow-up with gastroenterology service.   Hypokalemia -repleted and stable - Continue to follow ultralights trend.   Mild Cognitive Impairment/physical deconditioning -family endorses that pt has been lost while driving and occasionally slow to respond -outpt referral for neuropsych eval recommended. - Short-term rehabilitation at skilled nursing facility will be arranged.   Lung Nodule - Patient qualifies for low risk and no further images for follow-up recommended by radiology service.. - Continue to follow clinically and decide future images needs.    Consultants: None Procedures performed: See below for x-ray reports. Disposition: Skilled nursing facility Diet recommendation: Dysphagia 3 with thin liquids.  DISCHARGE MEDICATION: Allergies as of 01/15/2024       Reactions   Bactrim [sulfamethoxazole-trimethoprim] Rash        Medication List     STOP taking these medications    clopidogrel  75 MG tablet Commonly known as: PLAVIX        TAKE these medications  acetaminophen  650 MG CR tablet Commonly known as: TYLENOL  Take  1,300 mg by mouth 2 (two) times daily.   amoxicillin-clavulanate 875-125 MG tablet Commonly known as: AUGMENTIN Take 1 tablet by mouth every 12 (twelve) hours for 3 days.   aspirin  81 MG chewable tablet Chew 1 tablet (81 mg total) by mouth daily.   atorvastatin  20 MG tablet Commonly known as: LIPITOR Take 20 mg by mouth daily.   esomeprazole  40 MG capsule Commonly known as: NEXIUM  Take 1 capsule (40 mg total) by mouth 2 (two) times daily before a meal. Do NOT take until done with 20 days of plavix    finasteride  5 MG tablet Commonly known as: PROSCAR  Take 1 tablet (5 mg total) by mouth daily. TAKE 1 TABLET(5 MG) BY MOUTH DAILY   mirabegron  ER 50 MG Tb24 tablet Commonly known as: MYRBETRIQ  Take 1 tablet (50 mg total) by mouth daily.   multivitamin tablet Take 1 tablet by mouth daily.   sucralfate  1 GM/10ML suspension Commonly known as: CARAFATE  Take 10 mLs (1 g total) by mouth 4 (four) times daily -  before meals and at bedtime.   tamsulosin  0.4 MG Caps capsule Commonly known as: FLOMAX  Take 1 capsule (0.4 mg total) by mouth daily.   Tremfya  Pen 200 MG/2ML Soaj Generic drug: Guselkumab  Inject 200 mg into the skin every 28 (twenty-eight) days.   Vyzulta  0.024 % Soln Generic drug: Latanoprostene Bunod  Place 1 drop into the left eye at bedtime.        Contact information for after-discharge care     Destination     [facility] .   Service: Skilled Nursing Contact information: [address] [place]  [address] [phone]                    Discharge Exam: David Mooney   01/10/24 1422 01/11/24 1300  Weight: 77.1 kg 80.1 kg   General exam: Alert, awake, in no acute distress and following commands appropriately. Respiratory system: Good saturation on room air. Cardiovascular system: No rubs, no gallops, no JVD. Gastrointestinal system: Abdomen is nondistended, soft and nontender. No organomegaly or masses felt. Normal  bowel sounds heard. Central nervous system: Generally weak.  No focal neurological deficits. Extremities: No cyanosis or clubbing. Skin: No petechiae. Psychiatry: Judgement and insight appear normal. Mood & affect appropriate.    Condition at discharge: Stable and improved.  The results of significant diagnostics from this hospitalization (including imaging, microbiology, ancillary and laboratory) are listed below for reference.   Imaging Studies: CT CHEST WO CONTRAST Result Date: 01/14/2024 CLINICAL DATA:  Dyspnea. Respiratory illness. Left basilar airspace opacities suspicious for pneumonia and possible trace bilateral pleural effusions on a recent chest radiograph. EXAM: CT CHEST WITHOUT CONTRAST TECHNIQUE: Multidetector CT imaging of the chest was performed following the standard protocol without IV contrast. RADIATION DOSE REDUCTION: This exam was performed according to the departmental dose-optimization program which includes automated exposure control, adjustment of the mA and/or kV according to patient size and/or use of iterative reconstruction technique. COMPARISON:  Chest radiographs dated 01/11/2024 and 01/10/2024. FINDINGS: Cardiovascular: Atheromatous calcifications, including the coronary arteries and aorta. Normal-sized heart. No pericardial effusion. Enlarged right main pulmonary artery with diameter of 3.2 cm. Normal-sized main pulmonary artery and left main pulmonary artery. Mediastinum/Nodes: No enlarged mediastinal or axillary lymph nodes. Thyroid  gland, trachea, and esophagus demonstrate no significant findings. Lungs/Pleura: Compressive atelectasis and additional patchy opacity in the left lower lobe. Small left pleural effusion. No right  pleural effusion. Small right middle lobe calcified granuloma. 2 mm noncalcified nodule in the left upper lobe on image number 32/4. Small subpleural lymph node beneath the minor fissure on the right. Upper Abdomen: Atheromatous arterial  calcifications. Musculoskeletal: Thoracic and lower cervical spine degenerative changes. IMPRESSION: 1. Left lower lobe pneumonia and compressive atelectasis. 2. Small left pleural effusion. 3. Enlarged right main pulmonary artery, suggesting pulmonary arterial hypertension. 4. 2 mm noncalcified nodule in the left upper lobe. No follow-up needed if patient is low-risk.This recommendation follows the consensus statement: Guidelines for Management of Incidental Pulmonary Nodules Detected on CT Images: From the Fleischner Society 2017; Radiology 2017; 284:228-243. 5. Calcific coronary artery and aortic atherosclerosis. Aortic Atherosclerosis (ICD10-I70.0). Electronically Signed   By: Elspeth Bathe M.D.   On: 01/14/2024 14:30   EEG adult Result Date: 01/13/2024 Shelton Arlin KIDD, MD     01/13/2024  5:35 PM Patient Name: David Mooney MRN: [id number] Epilepsy Attending: Arlin KIDD Shelton Referring Physician/Provider: Evonnie Lenis, MD Date: 01/13/2024 Duration: 23.32 mins Patient history: 83yo M with ams. EEG to evaluate for seizure Level of alertness: Awake AEDs during EEG study: None Technical aspects: This EEG study was done with scalp electrodes positioned according to the 10-20 International system of electrode placement. Electrical activity was reviewed with band pass filter of 1-70Hz , sensitivity of 7 uV/mm, display speed of 106mm/sec with a 60Hz  notched filter applied as appropriate. EEG data were recorded continuously and digitally stored.  Video monitoring was available and reviewed as appropriate. Description: The posterior dominant rhythm consists of 8-9 Hz activity of moderate voltage (25-35 uV) seen predominantly in posterior head regions, symmetric and reactive to eye opening and eye closing. EEG showed intermittent generalized 3 to 6 Hz theta-delta slowing. Hyperventilation and photic stimulation were not performed.   ABNORMALITY - Intermittent slow, generalized IMPRESSION: This study is suggestive of mild  diffuse encephalopathy. No seizures or epileptiform discharges were seen throughout the recording. Arlin KIDD Shelton   ECHOCARDIOGRAM LIMITED Result Date: 01/11/2024    ECHOCARDIOGRAM LIMITED REPORT   Patient Name:   David Mooney Date of Exam: 01/11/2024 Medical Rec #:  [id number]       Height:       68.0 in Accession #:    [id number]      Weight:       176.6 lb Date of Birth:  [birth year]       BSA:          1.938 m Patient Age:    83 years        BP:           96/41 mmHg Patient Gender: M               HR:           95 bpm. Exam Location:  [facility] Procedure: Limited Echo, Limited Color Doppler, Cardiac Doppler and PEDOF (Both            Spectral and Color Flow Doppler were utilized during procedure). Indications:    TIA G45.9  History:        Patient has prior history of Echocardiogram examinations, most                 recent 12/16/2023. TIA and DVT, GERD.  Sonographer:    Koleen Popper RDCS Referring Phys: [id number] OLADAPO ADEFESO IMPRESSIONS  1. Left ventricular ejection fraction, by estimation, is 65 to 70%. The left ventricle has normal function. The left ventricle has no  regional wall motion abnormalities. Left ventricular diastolic parameters are consistent with Grade I diastolic dysfunction (impaired relaxation).  2. The mitral valve is normal in structure. No evidence of mitral valve regurgitation. No evidence of mitral stenosis.  3. The aortic valve was not well visualized. Aortic valve regurgitation is trivial. Moderate aortic valve stenosis. Aortic valve area, by VTI measures 1.33 cm. Aortic valve mean gradient measures 25.0 mmHg. Aortic valve Vmax measures 3.40 m/s. Aortic valve acceleration time measures 102 msec.  4. Tricuspid regurgitation signal is inadequate for assessing PA pressure. Comparison(s): No significant change from prior study. FINDINGS  Left Ventricle: Left ventricular ejection fraction, by estimation, is 65 to 70%. The left ventricle has normal function. The left ventricle has  no regional wall motion abnormalities. The left ventricular internal cavity size was normal in size. There is  no left ventricular hypertrophy. Left ventricular diastolic parameters are consistent with Grade I diastolic dysfunction (impaired relaxation). Right Ventricle: Tricuspid regurgitation signal is inadequate for assessing PA pressure. Left Atrium: Left atrial size was normal in size. Right Atrium: Right atrial size was normal in size. Mitral Valve: The mitral valve is normal in structure. No evidence of mitral valve stenosis. Tricuspid Valve: The tricuspid valve is normal in structure. Tricuspid valve regurgitation is not demonstrated. No evidence of tricuspid stenosis. Aortic Valve: The aortic valve was not well visualized. Aortic valve regurgitation is trivial. Moderate aortic stenosis is present. Aortic valve mean gradient measures 25.0 mmHg. Aortic valve peak gradient measures 46.2 mmHg. Aortic valve area, by VTI measures 1.33 cm. Pulmonic Valve: The pulmonic valve was not well visualized. Pulmonic valve regurgitation is not visualized. No evidence of pulmonic stenosis. IAS/Shunts: No atrial level shunt detected by color flow Doppler. Additional Comments: Spectral Doppler performed. Color Doppler performed.  LEFT VENTRICLE PLAX 2D LVIDd:         4.50 cm      Diastology LVIDs:         2.50 cm      LV e' medial:    6.64 cm/s LV PW:         1.00 cm      LV E/e' medial:  11.0 LV IVS:        1.20 cm      LV e' lateral:   7.07 cm/s LVOT diam:     1.90 cm      LV E/e' lateral: 10.4 LV SV:         70 LV SV Index:   36 LVOT Area:     2.84 cm  LV Volumes (MOD) LV vol d, MOD A4C: 110.0 ml LV vol s, MOD A4C: 46.3 ml LV SV MOD A4C:     110.0 ml RIGHT VENTRICLE             IVC RV S prime:     17.10 cm/s  IVC diam: 2.70 cm LEFT ATRIUM             Index LA diam:        3.60 cm 1.86 cm/m LA Vol (A2C):   31.0 ml 15.99 ml/m LA Vol (A4C):   33.7 ml 17.39 ml/m LA Biplane Vol: 33.0 ml 17.02 ml/m  AORTIC VALVE AV Area  (Vmax):    1.37 cm AV Area (Vmean):   1.31 cm AV Area (VTI):     1.33 cm AV Vmax:           340.00 cm/s AV Vmean:  224.500 cm/s AV VTI:            0.527 m AV Peak Grad:      46.2 mmHg AV Mean Grad:      25.0 mmHg LVOT Vmax:         164.00 cm/s LVOT Vmean:        104.000 cm/s LVOT VTI:          0.247 m LVOT/AV VTI ratio: 0.47  AORTA Ao Root diam: 3.20 cm MITRAL VALVE MV Area (PHT): 4.06 cm     SHUNTS MV Decel Time: 187 msec     Systemic VTI:  0.25 m MV E velocity: 73.30 cm/s   Systemic Diam: 1.90 cm MV A velocity: 112.00 cm/s MV E/A ratio:  0.65 Stanly Leavens MD Electronically signed by Stanly Leavens MD Signature Date/Time: 01/11/2024/2:52:09 PM    Final    DG CHEST PORT 1 VIEW Result Date: 01/11/2024 CLINICAL DATA:  Dyspnea and respiratory abnormalities EXAM: PORTABLE CHEST - 1 VIEW COMPARISON:  01/10/2024 FINDINGS: Cardiomediastinal silhouette and pulmonary vasculature are within normal limits. Patchy opacity seen at the LEFT lung base have mildly increased since prior examination. There is minimal blunting of both lateral costophrenic angles. IMPRESSION: 1. Patchy LEFT lung base airspace opacities are suspicious for developing pneumonia. 2. Minimal blunting of BILATERAL costophrenic angles suspicious for trace pleural effusions. Electronically Signed   By: Aliene Lloyd M.D.   On: 01/11/2024 13:55   DG Chest 2 View Result Date: 01/10/2024 EXAM: 2 VIEW(S) XRAY OF THE CHEST 01/10/2024 06:27:00 PM COMPARISON: 08/13/2023 CLINICAL HISTORY: AMS FINDINGS: LUNGS AND PLEURA: Linear opacity at left base consistent with scarring or atelectasis. No pleural effusion. No pneumothorax. HEART AND MEDIASTINUM: Calcified aorta. BONES AND SOFT TISSUES: No acute osseous abnormality. IMPRESSION: 1. No acute cardiopulmonary abnormality. Electronically signed by: Morgane Naveau MD 01/10/2024 08:30 PM EST RP Workstation: [id number]   MR BRAIN WO CONTRAST Result Date: 01/10/2024 EXAM: MRI BRAIN WITHOUT  CONTRAST 01/10/2024 05:40:24 PM TECHNIQUE: Multiplanar multisequence MRI of the head/brain was performed without the administration of intravenous contrast. COMPARISON: CT head earlier today. CLINICAL HISTORY: Recent TIA, AMS, ataxic FINDINGS: BRAIN AND VENTRICLES: No acute infarct. No intracranial hemorrhage. No mass. No midline shift. No hydrocephalus. The sella is unremarkable. Normal flow voids. ORBITS: No acute abnormality. SINUSES AND MASTOIDS: No acute abnormality. BONES AND SOFT TISSUES: Normal marrow signal. No acute soft tissue abnormality. IMPRESSION: 1. No acute intracranial abnormality. Electronically signed by: Gilmore Molt 01/10/2024 07:05 PM EST RP Workstation: [id number]   CT Cervical Spine Wo Contrast Result Date: 01/10/2024 EXAM: CT CERVICAL SPINE WITHOUT CONTRAST 01/10/2024 03:34:09 PM TECHNIQUE: CT of the cervical spine was performed without the administration of intravenous contrast. Multiplanar reformatted images are provided for review. Automated exposure control, iterative reconstruction, and/or weight based adjustment of the mA/kV was utilized to reduce the radiation dose to as low as reasonably achievable. COMPARISON: 01/02/2018 CLINICAL HISTORY: Neck trauma (Age >= 65y) FINDINGS: BONES AND ALIGNMENT: Straightening of the normal cervical lordosis. Trace degenerative retrolisthesis of C3 on C4. Additional trace degenerative anterolisthesis of C7 on T1. No evidence of traumatic malalignment. No acute fracture. DEGENERATIVE CHANGES: Disc space narrowing and degenerative endplate osteophytes at multiple levels. Degenerative endplate changes greatest at C6-C7. Facet arthrosis and uncovertebral hypertrophy at multiple levels. No high grade osseous spinal canal stenosis. SOFT TISSUES: No prevertebral soft tissue swelling. IMPRESSION: 1. No evidence of acute traumatic injury. Electronically signed by: Donnice Mania MD 01/10/2024 03:48 PM EST RP Workstation: [id number]  CT Head Wo  Contrast Result Date: 01/10/2024 EXAM: CT HEAD WITHOUT CONTRAST 01/10/2024 03:34:09 PM TECHNIQUE: CT of the head was performed without the administration of intravenous contrast. Automated exposure control, iterative reconstruction, and/or weight based adjustment of the mA/kV was utilized to reduce the radiation dose to as low as reasonably achievable. COMPARISON: 12/15/2023 CLINICAL HISTORY: Head trauma, minor (Age >= 65y); Mental status change, unknown cause. FINDINGS: BRAIN AND VENTRICLES: No acute hemorrhage. No evidence of acute infarct. No hydrocephalus. No extra-axial collection. No mass effect or midline shift. 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 Age-related cerebral volume loss. Atherosclerosis of the carotid siphons and intracranial vertebral arteries. There is tortuosity of the basilar artery without evidence of dilatation. ORBITS: Bilateral lens replacement. SINUSES: No acute abnormality. SOFT TISSUES AND SKULL: No acute soft tissue abnormality. No skull fracture. IMPRESSION: 1. No acute intracranial abnormality. Electronically signed by: Donnice Mania MD 01/10/2024 03:43 PM EST RP Workstation: [id number]   EEG adult Result Date: 12/16/2023 Gregg Lek, MD     12/16/2023  4:34 PM Patient Name: David Mooney MRN: [id number] Epilepsy Attending: Lek Gregg Referring Physician/Provider: Sallyann Normie HERO, MD     Date: 12/16/2023 Duration: 22 minutes Patient history: 83 year old man found in his car in the parking lot with weakness. EEG to evaluate for seizure. Level of alertness: Awake, drowsy AEDs during EEG study: None Technical aspects: This EEG study was done with scalp electrodes positioned according to the 10-20 International system of electrode placement. Electrical activity was reviewed with band pass filter of 1-70Hz , sensitivity of 7 uV/mm, display speed of 18mm/sec with a 60Hz  notched filter applied as appropriate. EEG data were recorded continuously  and digitally stored.  Video monitoring was available and reviewed as appropriate. Description: The posterior dominant rhythm consists of 8-9 Hz activity of moderate voltage (25-35 uV) seen predominantly in posterior head regions, symmetric and reactive to eye opening and eye closing. Drowsiness was characterized by attenuation of the posterior background rhythm. Sleep was not seen.  There was additional period of excessive amount of 15 to 18 Hz, 2-3 uV beta activity distributed symmetrically and diffusely. Hyperventilation and photic stimulation were not performed.   ABNORMALITY - Excessive beta, generalized IMPRESSION: This study is within normal limits. No seizures or epileptiform discharges were seen throughout the recording. The excessive beta activity seen in the background is most likely due to the effect of benzodiazepine and is a benign EEG pattern. A normal interictal EEG does not exclude nor support the diagnosis of epilepsy. Lek Gregg MD Neurology    ECHOCARDIOGRAM COMPLETE Result Date: 12/16/2023    ECHOCARDIOGRAM REPORT   Patient Name:   David Mooney Date of Exam: 12/16/2023 Medical Rec #:  [id number]       Height:       66.0 in Accession #:    [id number]      Weight:       170.0 lb Date of Birth:  [birth year]       BSA:          1.866 m Patient Age:    83 years        BP:           92/51 mmHg Patient Gender: M               HR:           76 bpm. Exam Location:  [facility] Procedure: 2D Echo, Cardiac Doppler and Color Doppler (Both  Spectral and Color            Flow Doppler were utilized during procedure). Indications:    Stroke l63.9  History:        Patient has no prior history of Echocardiogram examinations.                 TIA, Stroke and DVT.  Sonographer:    Aida Pizza RCS Referring Phys: [id number] OLADAPO ADEFESO IMPRESSIONS  1. Left ventricular ejection fraction, by estimation, is 65 to 70%. The left ventricle has normal function. The left ventricle has no regional wall motion  abnormalities. There is mild asymmetric left ventricular hypertrophy of the basal segment. Left ventricular diastolic parameters are consistent with Grade I diastolic dysfunction (impaired relaxation).  2. Right ventricular systolic function is normal. The right ventricular size is normal. Tricuspid regurgitation signal is inadequate for assessing PA pressure.  3. The mitral valve is degenerative. Trivial mitral valve regurgitation.  4. The aortic valve is abnormal. There is moderate calcification of the aortic valve. Aortic valve regurgitation is trivial. Moderate aortic valve stenosis. Aortic valve mean gradient measures 21.0 mmHg. Dimentionless index 0.46.  5. The inferior vena cava is normal in size with greater than 50% respiratory variability, suggesting right atrial pressure of 3 mmHg. Comparison(s): No prior Echocardiogram. FINDINGS  Left Ventricle: Left ventricular ejection fraction, by estimation, is 65 to 70%. The left ventricle has normal function. The left ventricle has no regional wall motion abnormalities. The left ventricular internal cavity size was normal in size. There is  mild asymmetric left ventricular hypertrophy of the basal segment. Left ventricular diastolic parameters are consistent with Grade I diastolic dysfunction (impaired relaxation). Right Ventricle: The right ventricular size is normal. No increase in right ventricular wall thickness. Right ventricular systolic function is normal. Tricuspid regurgitation signal is inadequate for assessing PA pressure. Left Atrium: Left atrial size was normal in size. Right Atrium: Right atrial size was normal in size. Pericardium: There is no evidence of pericardial effusion. Presence of epicardial fat layer. Mitral Valve: The mitral valve is degenerative in appearance. Mild mitral annular calcification. Trivial mitral valve regurgitation. Tricuspid Valve: The tricuspid valve is grossly normal. Tricuspid valve regurgitation is trivial. Aortic Valve:  The aortic valve is abnormal. There is moderate calcification of the aortic valve. There is mild aortic valve annular calcification. Aortic valve regurgitation is trivial. Aortic regurgitation PHT measures 562 msec. Moderate aortic stenosis  is present. Aortic valve mean gradient measures 21.0 mmHg. Aortic valve peak gradient measures 39.0 mmHg. Aortic valve area, by VTI measures 1.17 cm. Pulmonic Valve: The pulmonic valve was grossly normal. Pulmonic valve regurgitation is trivial. Aorta: The aortic root is normal in size and structure. Venous: The inferior vena cava is normal in size with greater than 50% respiratory variability, suggesting right atrial pressure of 3 mmHg. IAS/Shunts: No atrial level shunt detected by color flow Doppler. Additional Comments: 3D was performed not requiring image post processing on an independent workstation and was indeterminate.  LEFT VENTRICLE PLAX 2D LVIDd:         4.40 cm   Diastology LVIDs:         3.00 cm   LV e' medial:    6.84 cm/s LV PW:         1.00 cm   LV E/e' medial:  13.8 LV IVS:        1.10 cm   LV e' lateral:   7.71 cm/s LVOT diam:     1.80 cm  LV E/e' lateral: 12.2 LV SV:         82 LV SV Index:   44 LVOT Area:     2.54 cm  RIGHT VENTRICLE RV S prime:     15.30 cm/s TAPSE (M-mode): 2.1 cm LEFT ATRIUM             Index        RIGHT ATRIUM           Index LA diam:        3.50 cm 1.88 cm/m   RA Area:     14.00 cm LA Vol (A2C):   51.9 ml 27.81 ml/m  RA Volume:   31.40 ml  16.82 ml/m LA Vol (A4C):   39.6 ml 21.22 ml/m LA Biplane Vol: 45.5 ml 24.38 ml/m  AORTIC VALVE AV Area (Vmax):    0.87 cm AV Area (Vmean):   0.90 cm AV Area (VTI):     1.17 cm AV Vmax:           312.33 cm/s AV Vmean:          211.000 cm/s AV VTI:            0.701 m AV Peak Grad:      39.0 mmHg AV Mean Grad:      21.0 mmHg LVOT Vmax:         107.00 cm/s LVOT Vmean:        74.800 cm/s LVOT VTI:          0.323 m LVOT/AV VTI ratio: 0.46 AI PHT:            562 msec  AORTA Ao Root diam: 3.50 cm  MITRAL VALVE MV Area (PHT): 2.80 cm     SHUNTS MV Decel Time: 271 msec     Systemic VTI:  0.32 m MV E velocity: 94.30 cm/s   Systemic Diam: 1.80 cm MV A velocity: 121.00 cm/s MV E/A ratio:  0.78 Jayson Sierras MD Electronically signed by Jayson Sierras MD Signature Date/Time: 12/16/2023/2:51:34 PM    Final    MR BRAIN WO CONTRAST Result Date: 12/16/2023 EXAM: MRI BRAIN WITHOUT CONTRAST 12/16/2023 11:40 AM TECHNIQUE: Multiplanar multisequence MRI of the head/brain was performed without the administration of intravenous contrast. COMPARISON: CT head 12/15/2023. CLINICAL HISTORY: Neuro deficit, acute, stroke suspected. Acute onset of right sided weakness and slurred speech. FINDINGS: BRAIN AND VENTRICLES: No acute infarct, mass, midline shift, hydrocephalus, or extraaxial fluid collection is identified. Chronic microhemorrhages are noted in the right thalamus and posterior right temporal lobe. T2 hyperintensities in the cerebral white matter bilaterally are nonspecific but compatible with mild chronic small vessel ischemic disease. There is mild cerebral atrophy. Major intracranial vascular flow voids are preserved. ORBITS: No acute abnormality. SINUSES AND MASTOIDS: No acute abnormality. BONES AND SOFT TISSUES: Normal marrow signal. No acute soft tissue abnormality. Bilateral cataract extraction. IMPRESSION: 1. No acute intracranial abnormality. 2. Mild chronic small vessel ischemic disease. Electronically signed by: Dasie Hamburg MD 12/16/2023 12:11 PM EST RP Workstation: [id number]    Microbiology: Results for orders placed or performed during the hospital encounter of 01/10/24  Resp panel by RT-PCR (RSV, Flu A&B, Covid) Anterior Nasal Swab     Status: None   Collection Time: 01/10/24  2:25 PM   Specimen: Anterior Nasal Swab  Result Value Ref Range Status   SARS Coronavirus 2 by RT PCR NEGATIVE NEGATIVE Final    Comment: (NOTE) SARS-CoV-2 target nucleic acids are NOT DETECTED.  The SARS-CoV-2 RNA  is  generally detectable in upper respiratory specimens during the acute phase of infection. The lowest concentration of SARS-CoV-2 viral copies this assay can detect is 138 copies/mL. A negative result does not preclude SARS-Cov-2 infection and should not be used as the sole basis for treatment or other patient management decisions. A negative result may occur with  improper specimen collection/handling, submission of specimen other than nasopharyngeal swab, presence of viral mutation(s) within the areas targeted by this assay, and inadequate number of viral copies(<138 copies/mL). A negative result must be combined with clinical observations, patient history, and epidemiological information. The expected result is Negative.  Fact Sheet for Patients:  bloggercourse.com  Fact Sheet for Healthcare Providers:  seriousbroker.it  This test is no t yet approved or cleared by the [place]  FDA and  has been authorized for detection and/or diagnosis of SARS-CoV-2 by FDA under an Emergency Use Authorization (EUA). This EUA will remain  in effect (meaning this test can be used) for the duration of the COVID-19 declaration under Section 564(b)(1) of the Act, 21 U.S.C.section 360bbb-3(b)(1), unless the authorization is terminated  or revoked sooner.       Influenza A by PCR NEGATIVE NEGATIVE Final   Influenza B by PCR NEGATIVE NEGATIVE Final    Comment: (NOTE) The Xpert Xpress SARS-CoV-2/FLU/RSV plus assay is intended as an aid in the diagnosis of influenza from Nasopharyngeal swab specimens and should not be used as a sole basis for treatment. Nasal washings and aspirates are unacceptable for Xpert Xpress SARS-CoV-2/FLU/RSV testing.  Fact Sheet for Patients: bloggercourse.com  Fact Sheet for Healthcare Providers: seriousbroker.it  This test is not yet approved or cleared by the [place]  [place] FDA and has been authorized for detection and/or diagnosis of SARS-CoV-2 by FDA under an Emergency Use Authorization (EUA). This EUA will remain in effect (meaning this test can be used) for the duration of the COVID-19 declaration under Section 564(b)(1) of the Act, 21 U.S.C. section 360bbb-3(b)(1), unless the authorization is terminated or revoked.     Resp Syncytial Virus by PCR NEGATIVE NEGATIVE Final    Comment: (NOTE) Fact Sheet for Patients: bloggercourse.com  Fact Sheet for Healthcare Providers: seriousbroker.it  This test is not yet approved or cleared by the [place]  FDA and has been authorized for detection and/or diagnosis of SARS-CoV-2 by FDA under an Emergency Use Authorization (EUA). This EUA will remain in effect (meaning this test can be used) for the duration of the COVID-19 declaration under Section 564(b)(1) of the Act, 21 U.S.C. section 360bbb-3(b)(1), unless the authorization is terminated or revoked.  Performed at [facility], [address] [place], [place] [address]   Culture, blood (Routine X 2) w Reflex to ID Panel     Status: None (Preliminary result)   Collection Time: 01/11/24 12:09 PM   Specimen: BLOOD  Result Value Ref Range Status   Specimen Description BLOOD RIGHT ANTECUBITAL  Final   Special Requests   Final    BOTTLES DRAWN AEROBIC AND ANAEROBIC Blood Culture adequate volume   Culture   Final    NO GROWTH 4 DAYS Performed at [facility], [address] [place], [place] [address]    Report Status PENDING  Incomplete  Culture, blood (Routine X 2) w Reflex to ID Panel     Status: None (Preliminary result)   Collection Time: 01/11/24 12:17 PM   Specimen: BLOOD  Result Value Ref Range Status   Specimen Description BLOOD BLOOD LEFT WRIST  Final   Special Requests  Final    BOTTLES DRAWN AEROBIC AND ANAEROBIC Blood Culture adequate volume   Culture   Final    NO GROWTH 4  DAYS Performed at [facility], [address], [place], [place] [address]    Report Status PENDING  Incomplete  MRSA Next Gen by PCR, Nasal     Status: None   Collection Time: 01/11/24 12:39 PM   Specimen: Nasal Mucosa; Nasal Swab  Result Value Ref Range Status   MRSA by PCR Next Gen NOT DETECTED NOT DETECTED Final    Comment: (NOTE) The GeneXpert MRSA Assay (FDA approved for NASAL specimens only), is one component of a comprehensive MRSA colonization surveillance program. It is not intended to diagnose MRSA infection nor to guide or monitor treatment for MRSA infections. Test performance is not FDA approved in patients less than 41 years old. Performed at [facility], [address] [place], [place] [address]   Respiratory (~20 pathogens) panel by PCR     Status: None   Collection Time: 01/13/24  7:37 AM   Specimen: Nasopharyngeal Swab; Respiratory  Result Value Ref Range Status   Adenovirus NOT DETECTED NOT DETECTED Final   Coronavirus 229E NOT DETECTED NOT DETECTED Final    Comment: (NOTE) The Coronavirus on the Respiratory Panel, DOES NOT test for the novel  Coronavirus (2019 nCoV)    Coronavirus HKU1 NOT DETECTED NOT DETECTED Final   Coronavirus NL63 NOT DETECTED NOT DETECTED Final   Coronavirus OC43 NOT DETECTED NOT DETECTED Final   Metapneumovirus NOT DETECTED NOT DETECTED Final   Rhinovirus / Enterovirus NOT DETECTED NOT DETECTED Final   Influenza A NOT DETECTED NOT DETECTED Final   Influenza B NOT DETECTED NOT DETECTED Final   Parainfluenza Virus 1 NOT DETECTED NOT DETECTED Final   Parainfluenza Virus 2 NOT DETECTED NOT DETECTED Final   Parainfluenza Virus 3 NOT DETECTED NOT DETECTED Final   Parainfluenza Virus 4 NOT DETECTED NOT DETECTED Final   Respiratory Syncytial Virus NOT DETECTED NOT DETECTED Final   Bordetella pertussis NOT DETECTED NOT DETECTED Final   Bordetella Parapertussis NOT DETECTED NOT DETECTED Final   Chlamydophila pneumoniae NOT DETECTED NOT  DETECTED Final   Mycoplasma pneumoniae NOT DETECTED NOT DETECTED Final    Comment: Performed at [facility], [address], [place], [place] [address]  Urine Culture     Status: None   Collection Time: 01/13/24  7:46 AM   Specimen: Urine, Random  Result Value Ref Range Status   Specimen Description   Final    URINE, RANDOM Performed at [facility], [address], [place], [place] [address]    Special Requests   Final    NONE Reflexed from [id number] Performed at [facility], [address], [place], [place] [address]    Culture   Final    NO GROWTH Performed at [facility], [address], [place], [place] [address]    Report Status 01/14/2024 FINAL  Final    Labs: CBC: Recent Labs  Lab 01/10/24 1445 01/11/24 0450 01/12/24 0540 01/13/24 0320 01/14/24 0437  WBC 10.9* 8.4 8.9 8.1 7.2  HGB 12.0* 10.4* 10.8* 9.9* 10.4*  HCT 35.7* 31.0* 32.7* 30.3* 31.7*  MCV 89.0 89.3 88.1 89.6 89.0  PLT 150 137* 153 165 213   Basic Metabolic Panel: Recent Labs  Lab 01/10/24 1445 01/11/24 0450 01/12/24 0540 01/13/24 0320 01/14/24 0437  NA 140 139 138 137 143  K 4.0 3.7 3.5 3.5 4.5  CL 104 105 104  104 108  CO2 23 27 25 24 29   GLUCOSE 113* 116* 119* 113* 118*  BUN 12 10 13 14 15   CREATININE 0.93 0.79 0.96 0.99 1.10  CALCIUM  9.9 9.4 9.4 9.2 10.0  MG  --  2.1 2.2 2.2 2.3  PHOS  --  2.5  --   --   --    Liver Function Tests: Recent Labs  Lab 01/10/24 1445 01/11/24 0450  AST 30 26  ALT 19 17  ALKPHOS 49 44  BILITOT 0.6 0.6  PROT 7.0 6.0*  ALBUMIN 4.3 3.6   CBG: Recent Labs  Lab 01/10/24 1445  GLUCAP 113*    Discharge time spent:  35 minutes.  Signed: Eric Nunnery, MD [facility] 01/15/2024 "

## 2024-01-16 LAB — CULTURE, BLOOD (ROUTINE X 2)
Culture: NO GROWTH
Culture: NO GROWTH
Special Requests: ADEQUATE
Special Requests: ADEQUATE

## 2024-01-17 ENCOUNTER — Non-Acute Institutional Stay (SKILLED_NURSING_FACILITY): Payer: Self-pay | Admitting: Adult Health

## 2024-01-17 ENCOUNTER — Encounter: Payer: Self-pay | Admitting: Adult Health

## 2024-01-17 DIAGNOSIS — H409 Unspecified glaucoma: Secondary | ICD-10-CM

## 2024-01-17 DIAGNOSIS — G8191 Hemiplegia, unspecified affecting right dominant side: Secondary | ICD-10-CM

## 2024-01-17 DIAGNOSIS — K50818 Crohn's disease of both small and large intestine with other complication: Secondary | ICD-10-CM | POA: Diagnosis not present

## 2024-01-17 DIAGNOSIS — E785 Hyperlipidemia, unspecified: Secondary | ICD-10-CM

## 2024-01-17 DIAGNOSIS — K257 Chronic gastric ulcer without hemorrhage or perforation: Secondary | ICD-10-CM | POA: Diagnosis not present

## 2024-01-17 DIAGNOSIS — J690 Pneumonitis due to inhalation of food and vomit: Secondary | ICD-10-CM | POA: Diagnosis not present

## 2024-01-17 DIAGNOSIS — G3184 Mild cognitive impairment, so stated: Secondary | ICD-10-CM | POA: Diagnosis not present

## 2024-01-17 DIAGNOSIS — D649 Anemia, unspecified: Secondary | ICD-10-CM

## 2024-01-17 DIAGNOSIS — N138 Other obstructive and reflux uropathy: Secondary | ICD-10-CM | POA: Diagnosis not present

## 2024-01-17 DIAGNOSIS — N401 Enlarged prostate with lower urinary tract symptoms: Secondary | ICD-10-CM

## 2024-01-17 DIAGNOSIS — G9341 Metabolic encephalopathy: Secondary | ICD-10-CM | POA: Diagnosis not present

## 2024-01-17 NOTE — Progress Notes (Signed)
 " Location:  [facility] Room Number: 155 P Place of Service:  SNF (31)   CODE STATUS: Full Code  Allergies[1]  Chief Complaint  Patient presents with   Hospitalization Follow-up    HPI:  He is a 84 year old man who has been hospitalized from 01-10-24 through 01-15-24. His past medical history includes: ulcerative colitis on tremfya ; history of DVT (01-08-23 completed eliquis on 08-03-23); recent TIA 12-16-23; and duodenal ulceration. He presented to the ED with altered mental status and multiple falls. His family was concerned that he had had another neurological event as he was falling to the right. However; his gait was unsteady and he had generalized weakness. He developed fevers on 01-11-24 he had blood cultures drawn with concern for aspiration pneumonia he was started on zoysn and is on oral augmentin. His chest x-ray demonstrated bilateral patchy infiltrates R>L. CT demonstrated left side infiltrates.  His acute metabolic encephalopathy was felt secondary to infection.  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  He has had a recent admission for tia and has completed 21 days of dual antiplatelet therapy and will continue low dose asa.  He is here for short term rehab with his goal to return back home. 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 without perforation: Right hemiparesis:    Past Medical History:  Diagnosis Date   Arthritis    Cataract    Complication of anesthesia    Embolus (HCC) 12/2022   left Hip   GERD (gastroesophageal reflux disease)    Glaucoma    Hernia of unspecified site of abdominal cavity without mention of obstruction or gangrene    History of gastric ulcer    History of kidney stones    PONV (postoperative nausea and vomiting)    Ulcerative colitis     Past Surgical History:  Procedure  Laterality Date   BIOPSY  12/04/2018   Procedure: BIOPSY;  Surgeon: Golda Claudis PENNER, MD;  [facility]: [facility];  Service: Endoscopy;;  cecum   COLONOSCOPY     COLONOSCOPY N/A 08/27/2012   Procedure: COLONOSCOPY;  Surgeon: Claudis PENNER Golda, MD;  Location: [facility];  Service: Endoscopy;  Laterality: N/A;  1200-moved to 13:15 Ann to notify pt   COLONOSCOPY N/A 12/04/2018   Procedure: COLONOSCOPY;  Surgeon: Golda Claudis PENNER, MD;  [facility]: [facility];  Service: Endoscopy;  Laterality: N/A;  155pm   COLONOSCOPY N/A 10/01/2023   Procedure: COLONOSCOPY;  Surgeon: Eartha Angelia Sieving, MD;  [facility]: [facility];  Service: Gastroenterology;  Laterality: N/A;  8:15 am, asa 3   CYSTOSCOPY WITH INSERTION OF UROLIFT N/A 04/30/2022   Procedure: CYSTOSCOPY WITH INSERTION OF UROLIFT;  Surgeon: Sherrilee Belvie CROME, MD;  Location: [facility];  Service: Urology;  Laterality: N/A;   ESOPHAGOGASTRODUODENOSCOPY N/A 07/09/2023   Procedure: EGD (ESOPHAGOGASTRODUODENOSCOPY);  Surgeon: Eartha Angelia, Sieving, MD;  [facility]: [facility];  Service: Gastroenterology;  Laterality: N/A;  7:30AM;ASA 3   ESOPHAGOGASTRODUODENOSCOPY (EGD) WITH PROPOFOL   01/04/2023   Procedure: ESOPHAGOGASTRODUODENOSCOPY (EGD) WITH PROPOFOL ;  Surgeon: Cindie Carlin POUR, DO;  [facility]: [facility];  Service: Endoscopy;;   ESOPHAGOGASTRODUODENOSCOPY (EGD) WITH PROPOFOL  N/A 04/09/2023   Procedure: ESOPHAGOGASTRODUODENOSCOPY (EGD) WITH PROPOFOL ;  Surgeon: Eartha Angelia Sieving, MD;  [facility]: [facility];  Service: Gastroenterology;  Laterality: N/A;  7:30am;asa 3   HERNIA REPAIR Left    MOLE  REMOVAL     From head , chest   UPPER GASTROINTESTINAL ENDOSCOPY      Social History   Socioeconomic History   Marital status: Married    Spouse name: Not on file   Number of children: Not on file   Years of education: Not on file   Highest education level: Not on file  Occupational History   Not on file  Tobacco Use    Smoking status: Never   Smokeless tobacco: Never  Vaping Use   Vaping status: Never Used  Substance and Sexual Activity   Alcohol  use: No   Drug use: No   Sexual activity: Yes  Other Topics Concern   Not on file  Social History Narrative   Not on file   Social Drivers of Health   Tobacco Use: Low Risk (01/17/2024)   Patient History    Smoking Tobacco Use: Never    Smokeless Tobacco Use: Never    Passive Exposure: Not on file  Financial Resource Strain: Not on file  Food Insecurity: No Food Insecurity (01/11/2024)   Epic    Worried About Programme Researcher, Broadcasting/film/video in the Last Year: Never true    Ran Out of Food in the Last Year: Never true  Transportation Needs: No Transportation Needs (01/11/2024)   Epic    Lack of Transportation (Medical): No    Lack of Transportation (Non-Medical): No  Physical Activity: Not on file  Stress: Not on file  Social Connections: Socially Integrated (01/11/2024)   Social Connection and Isolation Panel    Frequency of Communication with Friends and Family: More than three times a week    Frequency of Social Gatherings with Friends and Family: Twice a week    Attends Religious Services: 1 to 4 times per year    Active Member of [employer] or Organizations: Yes    Attends Banker Meetings: 1 to 4 times per year    Marital Status: Married  Catering Manager Violence: Not At Risk (01/11/2024)   Epic    Fear of Current or Ex-Partner: No    Emotionally Abused: No    Physically Abused: No    Sexually Abused: No  Depression (PHQ2-9): Low Risk (11/22/2023)   Depression (PHQ2-9)    PHQ-2 Score: 0  Alcohol  Screen: Not on file  Housing: Low Risk (01/11/2024)   Epic    Unable to Pay for Housing in the Last Year: No    Number of Times Moved in the Last Year: 0    Homeless in the Last Year: No  Utilities: Not At Risk (01/11/2024)   Epic    Threatened with loss of utilities: No  Health Literacy: Not on file   Family History  Problem Relation Age  of Onset   Arrhythmia Mother    Anuerysm Father    Hypertension Father    Diabetes Father    Healthy Sister    Healthy Sister    Irritable bowel syndrome Daughter    Thyroid  disease Daughter       VITAL SIGNS BP 122/66   Pulse 82   Temp 97.7 F (36.5 C)   Ht 5' 8 (1.727 m)   Wt 178 lb 12.8 oz (81.1 kg)   SpO2 97%   BMI 27.19 kg/m   Outpatient Encounter Medications as of 01/17/2024  Medication Sig   acetaminophen  (TYLENOL ) 650 MG CR tablet Take 1,300 mg by mouth 2 (two) times daily.   amoxicillin-clavulanate (AUGMENTIN) 875-125 MG tablet Take 1 tablet  by mouth every 12 (twelve) hours for 3 days.   aspirin  81 MG chewable tablet Chew 1 tablet (81 mg total) by mouth daily.   atorvastatin  (LIPITOR) 20 MG tablet Take 20 mg by mouth daily.   esomeprazole  (NEXIUM ) 40 MG capsule Take 1 capsule (40 mg total) by mouth 2 (two) times daily before a meal. Do NOT take until done with 20 days of plavix    finasteride  (PROSCAR ) 5 MG tablet Take 1 tablet (5 mg total) by mouth daily. TAKE 1 TABLET(5 MG) BY MOUTH DAILY   Latanoprostene Bunod  (VYZULTA ) 0.024 % SOLN Place 1 drop into the left eye at bedtime.   mirabegron  ER (MYRBETRIQ ) 50 MG TB24 tablet Take 1 tablet (50 mg total) by mouth daily.   Multiple Vitamin (MULTIVITAMIN) tablet Take 1 tablet by mouth daily.   sucralfate  (CARAFATE ) 1 GM/10ML suspension Take 10 mLs (1 g total) by mouth 4 (four) times daily -  before meals and at bedtime.   tamsulosin  (FLOMAX ) 0.4 MG CAPS capsule Take 1 capsule (0.4 mg total) by mouth daily.   TREMFYA  PEN 200 MG/2ML SOAJ Inject 200 mg into the skin every 28 (twenty-eight) days.   No facility-administered encounter medications on file as of 01/17/2024.     SIGNIFICANT DIAGNOSTIC EXAMS  LABS:   01-10-24: wbc 10.9; hgb 12.0; hct 35.7; mcv 89.0 plt 150; glucose 113; bun 12; creat 0.93; k+ 4.0; na++ 140; ca 9.9; gfr >60; protein 7.0 albumin 4.3 01-11-24: wbc 8.4; hgb 10.4; hct 31.0; mcv 89.3 plt 137 glucose  116; bun 10; creat 0.79; k+ 3.7; na++ 139; ca 9.4; gfr >60; protein 6.0 albumin 3.6; chol 109; ldl 47 trig 85 hdl 45 01-13-24: tsh 2.940; free t4: 1.10 vitamin B 12: 585  Review of Systems  Constitutional:  Negative for malaise/fatigue.  Respiratory:  Negative for cough and shortness of breath.   Cardiovascular:  Negative for chest pain, palpitations and leg swelling.  Gastrointestinal:  Negative for abdominal pain, constipation and heartburn.  Musculoskeletal:  Negative for back pain, joint pain and myalgias.  Skin: Negative.   Neurological:  Negative for dizziness.  Psychiatric/Behavioral:  The patient is not nervous/anxious.       Physical Exam Constitutional:      General: He is not in acute distress.    Appearance: He is well-developed. He is not diaphoretic.  Neck:     Thyroid : No thyromegaly.  Cardiovascular:     Rate and Rhythm: Normal rate and regular rhythm.     Heart sounds: Normal heart sounds.  Pulmonary:     Effort: Pulmonary effort is normal. No respiratory distress.     Breath sounds: Normal breath sounds.  Abdominal:     General: Bowel sounds are normal. There is no distension.     Palpations: Abdomen is soft.     Tenderness: There is no abdominal tenderness.  Musculoskeletal:        General: Normal range of motion.     Cervical back: Neck supple.     Right lower leg: Edema present.     Left lower leg: Edema present.  Lymphadenopathy:     Cervical: No cervical adenopathy.  Skin:    General: Skin is warm and dry.     Comments: Lower extremities discolored   Neurological:     Mental Status: He is alert and oriented to person, place, and time.  Psychiatric:        Mood and Affect: Mood normal.      ASSESSMENT/ PLAN:  TODAY  Acute metabolic encephalopathy/mild cognitive impairment: his mentation has improved; will have cognitive testing while here.   2. Aspiration pneumonitis: will complete augmentin and will monitor his status.   3. Crohn's  disease of both small and large intestine with complication/chronic gastric ulcer without hemorrhage without perforation: will continue nexium  40 mg twice daily carafate  1 gm four times daily; have placed a call to GI regarding his tremfya  as he is currently on abt for pneumonia; will hold this medication at this time  4.  Right hemiparesis: does have mild right side weakness present has had recent tia; will continue asa 91 mg daily   5. Benign prostatic hyperplasia with urinary obstruction: will continue flomax  0.4 mg daily; proscar  5 mg daily; myrbetriq  50 mg daily   6. Anemia unspecified type: hgb 10.4  7. Glaucoma left eye unspecified glaucoma type: will continue vyzulta  0.024% to left eye nightly   8. Chronic pain syndrome: will continue tylenol  1300 mg twice daily   9. Hyperlipidemia ldl goal <100: ldl 47 will continue lipitor 20 mg daily   Barnie Seip NP [facility]  call [phone]      [1]  Allergies Allergen Reactions   Bactrim [Sulfamethoxazole-Trimethoprim] Rash   "

## 2024-01-20 ENCOUNTER — Telehealth (INDEPENDENT_AMBULATORY_CARE_PROVIDER_SITE_OTHER): Payer: Self-pay | Admitting: Gastroenterology

## 2024-01-20 ENCOUNTER — Non-Acute Institutional Stay (SKILLED_NURSING_FACILITY): Payer: Self-pay | Admitting: Internal Medicine

## 2024-01-20 ENCOUNTER — Encounter: Payer: Self-pay | Admitting: Internal Medicine

## 2024-01-20 DIAGNOSIS — G459 Transient cerebral ischemic attack, unspecified: Secondary | ICD-10-CM

## 2024-01-20 DIAGNOSIS — K50019 Crohn's disease of small intestine with unspecified complications: Secondary | ICD-10-CM

## 2024-01-20 DIAGNOSIS — J690 Pneumonitis due to inhalation of food and vomit: Secondary | ICD-10-CM

## 2024-01-20 DIAGNOSIS — K5100 Ulcerative (chronic) pancolitis without complications: Secondary | ICD-10-CM

## 2024-01-20 NOTE — Assessment & Plan Note (Addendum)
 I contacted [facility]; they recommended holding the Tremfya  for 4 weeks following completion of the antibiotics and resolution of any active infectious symptoms.

## 2024-01-20 NOTE — Telephone Encounter (Signed)
 I discussed with Dr. Cinderella. Given recent pneumonia, needs to delay second Tremfya  infusion for four weeks after completion of antibiotics and then continue/complete induction. If he develops recurrent infection, GI needs to be notified to discuss potential Tremfya  holds.

## 2024-01-20 NOTE — Assessment & Plan Note (Signed)
 Continue aspirin  secondary prevention.

## 2024-01-20 NOTE — Telephone Encounter (Signed)
 Dr.William Tish multimedia programmer of [facility]) left voicemail asking if they could delay administering Tremfya  until patient has completed rehab. Pt is in [facility] after hospitalization from [facility] Acquired Pneumonia. Would like to delay administration due to immunosuppression and that TCS was essentially normal in September.   Pt is a pt of Dr.Castaneda. Please advise. Thank you! [phone]

## 2024-01-20 NOTE — Assessment & Plan Note (Signed)
 CT chest findings suggest possible community-acquired pneumonia as opposed to aspiration. Residual mild left lower lobe rhonchi but.  He remains afebrile. Speech Therapy will evaluate at SNF.

## 2024-01-20 NOTE — Progress Notes (Addendum)
 "   NURSING HOME LOCATION:  [facility] ROOM NUMBER:  155P  CODE STATUS:  Full Code  PCP:  Elspeth FORBES Messier MD  This is a comprehensive admission note to this SNFperformed on this date less than 30 days from date of admission. Included are preadmission medical/surgical history; reconciled medication list; family history; social history and comprehensive review of systems.  Corrections and additions to the records were documented. Comprehensive physical exam was also performed. Additionally a clinical summary was entered for each active diagnosis pertinent to this admission in the Problem List to enhance continuity of care.  HPI: He was hospitalized 12/26 - 01/15/2024 with clinical picture suggestive of possible recurrent TIA.  Family had noticed difficulty with balance with associated confusion and lethargy.  This was associated with multiple falls when he attempted to go to the bathroom the night PTA.  He was weak to the point that he required assistance sitting up. CNS CT revealed periventricular white matter changes from chronic small vessel ischemic disease as well as age-related cerebral volume loss.  Atherosclerotic carotid changes were noted as well as in the intracranial arteries.  No new acute process was identified on CT or MRI.   His wife states that he did produce some blood-tinged sputum.  Either in the ED or subsequently he exhibited rigors & temp elevation of 103 degrees F.  CT of the chest 12/29 revealed calcific atherosclerosis, atelectasis and patchy left lower lobe infiltrate and small effusion.  Aspiration pneumonia was suspected and Zosyn  and vancomycin  initiated; aspiration was suspected despite the fact that there were no right middle lobe or right lower lobe infiltrates.  White blood count was normal.  Course was complicated by acute metabolic encephalopathy which was attributed to the infectious process.  B12 was 585; TSH 2.940; and folate greater than  20. While hospitalized glucoses ranged from 113 up to 119.  Normochromic, normocytic anemia was present with final H/H of 10.4/31.7.  Clinically stabilized; PT/OT recommended SNF placement for rehab. The above scenario is in the context of having been hospitalized with a TIA on 11/30-12/02/2023 presenting as altered mental status, right-sided weakness, slurred speech, and multiple falls.  Prior to the admission 12/26 he had completed 21 days of dual antiplatelet therapy for the TIA.  He was continued on baby aspirin  for secondary prevention. PT/OT consulted and recommended SNF placement for rehab.  Past medical and surgical history includes glaucoma; history of gastric ulcer; history of nephrolithiasis; ulcerative colitis; and history of LLE DVT.  He had been on Eliquis from 01/07/2023 until 08/03/2023. Surgeries and procedures include multiple colonoscopies; cystoscopy with insertion of UroLift; multiple EGDs; and hernia repair.  Family history: reviewed, non contributory due to advanced age.  Social history: Nondrinker; non-smoker.   Review of systems: Clinical neurocognitive deficits made validity of responses questionable , compromising ROS completion. He stated that he was in the parking lot attempting to get out of his car when he could not not mobilize the right lower extremity.  This actually describes the events leading to his initial admission 11/30 rather than the most recent admission 12/26.  He could not tell me what diagnoses had been made in the hospital but stated they were thinking it was a mild stroke. He apparently has had ulcerative colitis since his 52s and was previously on sulfasalazine prior to Tremfya .  GI questions whether he may have Crohn's disease rather than ulcerative colitis apparently. His wife states he occasionally snores but denies any apnea.  Constitutional: No fever, significant weight change, fatigue  Eyes: No redness, discharge, pain, vision  change ENT/mouth: No nasal congestion, purulent discharge, earache, change in hearing, sore throat  Cardiovascular: No chest pain, palpitations, paroxysmal nocturnal dyspnea, claudication, edema  Respiratory: No active cough, sputum production, hemoptysis, DOE Gastrointestinal: No heartburn, dysphagia, current abdominal pain, nausea /vomiting, rectal bleeding, melena, change in bowels Genitourinary: No dysuria, hematuria, pyuria, incontinence, nocturia Musculoskeletal: No joint stiffness, joint swelling, pain Dermatologic: No rash, pruritus, change in appearance of skin Neurologic: No dizziness, headache, syncope, seizures, numbness, tingling Psychiatric: No significant anxiety, depression, insomnia, anorexia Endocrine: No change in hair/skin/nails, excessive thirst, excessive hunger, excessive urination  Hematologic/lymphatic: No significant bruising, lymphadenopathy, abnormal bleeding Allergy/immunology: No itchy/watery eyes, significant sneezing, urticaria, angioedema  Physical exam:  Pertinent or positive findings:  First heart sound is accentuated.  Grade 1 systolic murmur is present; carotid bruit versus radiation of the murmur also present.  He has low-grade rhonchi in the left lower lobe.  Abdomen is protuberant.  Pedal pulses are decreased to palpation.  He has nonpitting edema.  General appearance: Adequately nourished; no acute distress, increased work of breathing is present.   Lymphatic: No lymphadenopathy about the head, neck, axilla. Eyes: No conjunctival inflammation or lid edema is present. There is no scleral icterus. Ears:  External ear exam shows no significant lesions or deformities.   Nose:  External nasal examination shows no deformity or inflammation. Nasal mucosa are pink and moist without lesions, exudates Oral exam: Lips and gums are healthy appearing.There is no oropharyngeal erythema or exudate. Neck:  No thyromegaly, masses, tenderness noted.    Heart:  Normal  rate and regular rhythm without gallop, click, rub.  Lungs:  without wheezes,  rales, rubs. Abdomen: Bowel sounds are normal.  Abdomen is soft and nontender with no organomegaly, hernias, masses. GU: Deferred  Extremities:  No cyanosis, clubbing. Neurologic exam:  Balance, Rhomberg, finger to nose testing could not be completed due to clinical state Skin: Warm & dry w/o tenting. No significant lesions or rash.  See clinical summary under each active problem in the Problem List with associated updated therapeutic plan :  Aspiration pneumonitis (HCC) CT chest findings suggest possible community-acquired pneumonia as opposed to aspiration. Residual mild left lower lobe rhonchi but.  He remains afebrile. Speech Therapy will evaluate at SNF.  Crohn's disease of small intestine (HCC) He is presently on Tremfya . In view of the recent community-acquired pneumonia ; GI recommends holding Tremfya  for 4 weeks post resolution of all infectious symptoms and completion of antibiotic therapy.  TIA (transient ischemic attack) Continue aspirin  secondary prevention.        "

## 2024-01-20 NOTE — Telephone Encounter (Signed)
 Left message for wife to return call

## 2024-01-20 NOTE — Telephone Encounter (Signed)
 [name],   Has he started Tremya? If so, where is he in the treatment, induction? I am having a hard time finding this in the chart.  David Mooney

## 2024-01-20 NOTE — Assessment & Plan Note (Deleted)
 In view of the recent community-acquired pneumonia and negative findings a colonoscopy; we await GIs opinion as to whether the Tremfya  administration can be deferred until he completes rehab at the SNF.

## 2024-01-20 NOTE — Patient Instructions (Signed)
 See assessment and plan under each diagnosis in the problem list and acutely for this visit

## 2024-01-20 NOTE — Assessment & Plan Note (Addendum)
 He is presently on Tremfya . In view of the recent community-acquired pneumonia ; GI recommends holding Tremfya  for 4 weeks post resolution of all infectious symptoms and completion of antibiotic therapy.

## 2024-01-20 NOTE — Telephone Encounter (Signed)
 Alan from [facility] called in regards to Tremfya  hold. Pt was on antibiotic but completed antibiotic on Saturday 01/18/23 Alan states the daughter is more knowledgeable. Contacted daughter Eleanor. Pt had his first at home infusion on 12/20/23; his 2nd at home infusion was due on 01/17/24. Please advise. Thank you!

## 2024-01-21 NOTE — Telephone Encounter (Signed)
 Dr.Hopper contacted and verbalized understanding. Dr.Hopper states patient is doing well.

## 2024-01-23 ENCOUNTER — Ambulatory Visit: Admitting: Urology

## 2024-01-24 ENCOUNTER — Other Ambulatory Visit: Payer: Self-pay | Admitting: Adult Health

## 2024-01-24 ENCOUNTER — Non-Acute Institutional Stay (SKILLED_NURSING_FACILITY): Payer: Self-pay | Admitting: Adult Health

## 2024-01-24 ENCOUNTER — Encounter: Payer: Self-pay | Admitting: Adult Health

## 2024-01-24 DIAGNOSIS — R3915 Urgency of urination: Secondary | ICD-10-CM

## 2024-01-24 DIAGNOSIS — K269 Duodenal ulcer, unspecified as acute or chronic, without hemorrhage or perforation: Secondary | ICD-10-CM

## 2024-01-24 DIAGNOSIS — N138 Other obstructive and reflux uropathy: Secondary | ICD-10-CM

## 2024-01-24 DIAGNOSIS — J690 Pneumonitis due to inhalation of food and vomit: Secondary | ICD-10-CM | POA: Diagnosis not present

## 2024-01-24 DIAGNOSIS — N401 Enlarged prostate with lower urinary tract symptoms: Secondary | ICD-10-CM | POA: Diagnosis not present

## 2024-01-24 DIAGNOSIS — K50019 Crohn's disease of small intestine with unspecified complications: Secondary | ICD-10-CM

## 2024-01-24 DIAGNOSIS — R351 Nocturia: Secondary | ICD-10-CM

## 2024-01-24 DIAGNOSIS — G8191 Hemiplegia, unspecified affecting right dominant side: Secondary | ICD-10-CM

## 2024-01-24 MED ORDER — ESOMEPRAZOLE MAGNESIUM 40 MG PO CPDR: 40.0000 mg | DELAYED_RELEASE_CAPSULE | Freq: Two times a day (BID) | ORAL | 0 refills | Status: AC

## 2024-01-24 MED ORDER — SUCRALFATE 1 GM/10ML PO SUSP: 1.0000 g | Freq: Three times a day (TID) | ORAL | 0 refills | Status: AC

## 2024-01-24 MED ORDER — VYZULTA 0.024 % OP SOLN: 1.0000 [drp] | Freq: Every day | OPHTHALMIC | 0 refills | Status: AC

## 2024-01-24 MED ORDER — FINASTERIDE 5 MG PO TABS: 5.0000 mg | ORAL_TABLET | Freq: Every day | ORAL | 0 refills | Status: AC

## 2024-01-24 MED ORDER — TAMSULOSIN HCL 0.4 MG PO CAPS: 0.4000 mg | ORAL_CAPSULE | Freq: Every day | ORAL | 0 refills | Status: AC

## 2024-01-24 MED ORDER — MIRABEGRON ER 50 MG PO TB24: 50.0000 mg | ORAL_TABLET | Freq: Every day | ORAL | 0 refills | Status: AC

## 2024-01-24 NOTE — Progress Notes (Signed)
 " Location:  [facility] Room Number: South 155P Place of Service:  SNF (31)   CODE STATUS: Full Code  Allergies[1]  Chief Complaint  Patient presents with   Discharge Note   HPI:  He is being discharged to home with home health for pt/ot. He will need his prescriptions written and will need to follow up with his medical provider. He was hospitalized for pneumonia. He was admitted to this facility for short term rehab: ambulate 150 feet with rolling walker; upper body: independent; lower body mod assist; toilet mod assist. He is now ready for discharge to home.   Past Medical History:  Diagnosis Date   Arthritis    Cataract    Complication of anesthesia    Embolus (HCC) 12/2022   left Hip   GERD (gastroesophageal reflux disease)    Glaucoma    Hernia of unspecified site of abdominal cavity without mention of obstruction or gangrene    History of gastric ulcer    History of kidney stones    PONV (postoperative nausea and vomiting)    Ulcerative colitis     Past Surgical History:  Procedure Laterality Date   BIOPSY  12/04/2018   Procedure: BIOPSY;  Surgeon: Golda Claudis PENNER, MD;  Location: [facility];  Service: Endoscopy;;  cecum   COLONOSCOPY     COLONOSCOPY N/A 08/27/2012   Procedure: COLONOSCOPY;  Surgeon: Claudis PENNER Golda, MD;  Location: [facility];  Service: Endoscopy;  Laterality: N/A;  1200-moved to 13:15 Ann to notify pt   COLONOSCOPY N/A 12/04/2018   Procedure: COLONOSCOPY;  Surgeon: Golda Claudis PENNER, MD;  Location: [facility];  Service: Endoscopy;  Laterality: N/A;  155pm   COLONOSCOPY N/A 10/01/2023   Procedure: COLONOSCOPY;  Surgeon: Eartha Angelia Sieving, MD;  Location: [facility];  Service: Gastroenterology;  Laterality: N/A;  8:15 am, asa 3   CYSTOSCOPY WITH INSERTION OF UROLIFT N/A 04/30/2022   Procedure: CYSTOSCOPY WITH INSERTION OF UROLIFT;  Surgeon: Sherrilee Belvie CROME, MD;  Location: [facility];  Service: Urology;   Laterality: N/A;   ESOPHAGOGASTRODUODENOSCOPY N/A 07/09/2023   Procedure: EGD (ESOPHAGOGASTRODUODENOSCOPY);  Surgeon: Eartha Angelia, Sieving, MD;  Location: [facility];  Service: Gastroenterology;  Laterality: N/A;  7:30AM;ASA 3   ESOPHAGOGASTRODUODENOSCOPY (EGD) WITH PROPOFOL   01/04/2023   Procedure: ESOPHAGOGASTRODUODENOSCOPY (EGD) WITH PROPOFOL ;  Surgeon: Cindie Carlin POUR, DO;  Location: [facility];  Service: Endoscopy;;   ESOPHAGOGASTRODUODENOSCOPY (EGD) WITH PROPOFOL  N/A 04/09/2023   Procedure: ESOPHAGOGASTRODUODENOSCOPY (EGD) WITH PROPOFOL ;  Surgeon: Eartha Angelia Sieving, MD;  Location: [facility];  Service: Gastroenterology;  Laterality: N/A;  7:30am;asa 3   HERNIA REPAIR Left    MOLE REMOVAL     From head , chest   UPPER GASTROINTESTINAL ENDOSCOPY      Social History   Socioeconomic History   Marital status: Married    Spouse name: Not on file   Number of children: Not on file   Years of education: Not on file   Highest education level: Not on file  Occupational History   Not on file  Tobacco Use   Smoking status: Never   Smokeless tobacco: Never  Vaping Use   Vaping status: Never Used  Substance and Sexual Activity   Alcohol  use: No   Drug use: No   Sexual activity: Yes  Other Topics Concern   Not on file  Social History Narrative   Not on file   Social Drivers of Health  Tobacco Use: Low Risk (01/24/2024)   Patient History    Smoking Tobacco Use: Never    Smokeless Tobacco Use: Never    Passive Exposure: Not on file  Financial Resource Strain: Not on file  Food Insecurity: No Food Insecurity (01/11/2024)   Epic    Worried About Programme Researcher, Broadcasting/film/video in the Last Year: Never true    Ran Out of Food in the Last Year: Never true  Transportation Needs: No Transportation Needs (01/11/2024)   Epic    Lack of Transportation (Medical): No    Lack of Transportation (Non-Medical): No  Physical Activity: Not on file  Stress: Not on file  Social  Connections: Socially Integrated (01/11/2024)   Social Connection and Isolation Panel    Frequency of Communication with Friends and Family: More than three times a week    Frequency of Social Gatherings with Friends and Family: Twice a week    Attends Religious Services: 1 to 4 times per year    Active Member of [employer] or Organizations: Yes    Attends Banker Meetings: 1 to 4 times per year    Marital Status: Married  Catering Manager Violence: Not At Risk (01/11/2024)   Epic    Fear of Current or Ex-Partner: No    Emotionally Abused: No    Physically Abused: No    Sexually Abused: No  Depression (PHQ2-9): Low Risk (11/22/2023)   Depression (PHQ2-9)    PHQ-2 Score: 0  Alcohol  Screen: Not on file  Housing: Low Risk (01/11/2024)   Epic    Unable to Pay for Housing in the Last Year: No    Number of Times Moved in the Last Year: 0    Homeless in the Last Year: No  Utilities: Not At Risk (01/11/2024)   Epic    Threatened with loss of utilities: No  Health Literacy: Not on file   Family History  Problem Relation Age of Onset   Arrhythmia Mother    Anuerysm Father    Hypertension Father    Diabetes Father    Healthy Sister    Healthy Sister    Irritable bowel syndrome Daughter    Thyroid  disease Daughter       VITAL SIGNS BP 132/78   Pulse 75   Temp 97.9 F (36.6 C)   Ht 5' 6 (1.676 m)   Wt 185 lb (83.9 kg)   SpO2 96%   BMI 29.86 kg/m   Outpatient Encounter Medications as of 01/24/2024  Medication Sig   acetaminophen  (TYLENOL ) 650 MG CR tablet Take 1,300 mg by mouth 2 (two) times daily.   aspirin  81 MG chewable tablet Chew 1 tablet (81 mg total) by mouth daily.   atorvastatin  (LIPITOR) 20 MG tablet Take 20 mg by mouth daily.   esomeprazole  (NEXIUM ) 40 MG capsule Take 1 capsule (40 mg total) by mouth 2 (two) times daily before a meal. Do NOT take until done with 20 days of plavix    finasteride  (PROSCAR ) 5 MG tablet Take 1 tablet (5 mg total) by mouth  daily. TAKE 1 TABLET(5 MG) BY MOUTH DAILY   Latanoprostene Bunod  (VYZULTA ) 0.024 % SOLN Place 1 drop into the left eye at bedtime.   mirabegron  ER (MYRBETRIQ ) 50 MG TB24 tablet Take 1 tablet (50 mg total) by mouth daily.   Multiple Vitamin (MULTIVITAMIN) tablet Take 1 tablet by mouth daily.   sucralfate  (CARAFATE ) 1 GM/10ML suspension Take 10 mLs (1 g total) by mouth 4 (four) times daily -  before meals and at bedtime.   tamsulosin  (FLOMAX ) 0.4 MG CAPS capsule Take 1 capsule (0.4 mg total) by mouth daily.   TREMFYA  PEN 200 MG/2ML SOAJ Inject 200 mg into the skin every 28 (twenty-eight) days.   No facility-administered encounter medications on file as of 01/24/2024.     SIGNIFICANT DIAGNOSTIC EXAMS  LABS:   01-10-24: wbc 10.9; hgb 12.0; hct 35.7; mcv 89.0 plt 150; glucose 113; bun 12; creat 0.93; k+ 4.0; na++ 140; ca 9.9; gfr >60; protein 7.0 albumin 4.3 01-11-24: wbc 8.4; hgb 10.4; hct 31.0; mcv 89.3 plt 137 glucose 116; bun 10; creat 0.79; k+ 3.7; na++ 139; ca 9.4; gfr >60; protein 6.0 albumin 3.6; chol 109; ldl 47 trig 85 hdl 45 01-13-24: tsh 2.940; free t4: 1.10 vitamin B 12: 585  Review of Systems  Constitutional:  Negative for malaise/fatigue.  Respiratory:  Negative for cough and shortness of breath.   Cardiovascular:  Negative for chest pain, palpitations and leg swelling.  Gastrointestinal:  Negative for abdominal pain, constipation and heartburn.  Musculoskeletal:  Negative for back pain, joint pain and myalgias.  Skin: Negative.   Neurological:  Negative for dizziness.  Psychiatric/Behavioral:  The patient is not nervous/anxious.     Physical Exam Constitutional:      General: He is not in acute distress.    Appearance: He is well-developed. He is not diaphoretic.  Neck:     Thyroid : No thyromegaly.  Cardiovascular:     Rate and Rhythm: Normal rate and regular rhythm.     Heart sounds: Normal heart sounds.  Pulmonary:     Effort: Pulmonary effort is normal. No  respiratory distress.     Breath sounds: Normal breath sounds.  Abdominal:     General: Bowel sounds are normal. There is no distension.     Palpations: Abdomen is soft.     Tenderness: There is no abdominal tenderness.  Musculoskeletal:        General: Normal range of motion.     Cervical back: Neck supple.     Right lower leg: Edema present.     Left lower leg: Edema present.  Lymphadenopathy:     Cervical: No cervical adenopathy.  Skin:    General: Skin is warm and dry.     Comments: Lower extremities discolored  Neurological:     Mental Status: He is alert and oriented to person, place, and time.  Psychiatric:        Mood and Affect: Mood normal.     ASSESSMENT/ PLAN:   Patient is being discharged with the following home health services: pt/ot: to evaluate and treat as indicated for gait balance strength adl training    Patient is being discharged with the following durable medical equipment:  none needed   Patient has been advised to f/u with their PCP in 1-2 weeks to for a transitions of care visit.  Social services at their facility was responsible for arranging this appointment.  Pt was provided with adequate prescriptions of noncontrolled medications to reach the scheduled appointment .  For controlled substances, a limited supply was provided as appropriate for the individual patient.  If the pt normally receives these medications from a pain clinic or has a contract with another physician, these medications should be received from that clinic or physician only).    Prescriptions sent to [facility]  Time spent with patient: 35 minutes: dme; home health; medications.   Barnie Seip NP [facility]  call [phone]       [  1]  Allergies Allergen Reactions   Bactrim [Sulfamethoxazole-Trimethoprim] Rash   "

## 2024-02-04 ENCOUNTER — Other Ambulatory Visit: Payer: Self-pay

## 2024-02-04 ENCOUNTER — Other Ambulatory Visit: Payer: Self-pay | Admitting: Pharmacy Technician

## 2024-02-05 ENCOUNTER — Other Ambulatory Visit (HOSPITAL_COMMUNITY): Payer: Self-pay

## 2024-02-06 ENCOUNTER — Other Ambulatory Visit: Payer: Self-pay

## 2024-02-06 ENCOUNTER — Other Ambulatory Visit (HOSPITAL_COMMUNITY): Payer: Self-pay

## 2024-02-06 NOTE — Progress Notes (Signed)
 Patient signed up for Tremfya  with Me, should receive an email for verification.  Since patient has Medicare, it does not look like he applies for a savings card. It does look like he qualifies for the [facility] Prescription Payment Plan Program, which he would need to call his insurance for more information and to sign up.

## 2024-02-11 ENCOUNTER — Other Ambulatory Visit (HOSPITAL_COMMUNITY): Payer: Self-pay

## 2024-02-11 ENCOUNTER — Telehealth: Payer: Self-pay

## 2024-02-11 NOTE — Telephone Encounter (Signed)
 Pharmacy Patient Advocate Encounter  Left HIPAA compliant voicemail for patient to call back to discuss if he was able to receive and set up his account with TremfyaWithMe or to see if he has enrolled in the [facility] Prescription Payment Plan Program.

## 2024-02-12 ENCOUNTER — Other Ambulatory Visit: Payer: Self-pay

## 2024-02-13 ENCOUNTER — Other Ambulatory Visit (HOSPITAL_COMMUNITY): Payer: Self-pay

## 2024-02-13 NOTE — Telephone Encounter (Signed)
 Pharmacy Patient Advocate Encounter   Spoke with patient's daughter, Eleanor Farrow, on the phone and was able to talk to her about both options. All questions were answered. She said that she would like to take some time to speak with her father and discuss these options and would call back. States that he still has one dose of his medication.

## 2024-02-18 ENCOUNTER — Other Ambulatory Visit (HOSPITAL_COMMUNITY): Payer: Self-pay

## 2024-02-18 NOTE — Telephone Encounter (Signed)
 Pharmacy Patient Advocate Encounter   David Mooney called back stating that her father has decided he is going to pay the $1384.02 co-pay so that he can get his Medicare out-of-pocket deductible taken care of. She is traveling at the moment but will contact back with card information when she is able to. David Mooney had a dose on 02-17-2024.

## 2024-03-03 ENCOUNTER — Encounter: Payer: Self-pay | Admitting: Physician Assistant

## 2024-04-22 ENCOUNTER — Ambulatory Visit: Admitting: Urology
